# Patient Record
Sex: Female | Born: 1995 | Race: Black or African American | Hispanic: No | Marital: Single
Health system: Southern US, Community
[De-identification: ages and names within clinical notes are randomized; demographics above are authoritative.]

## PROBLEM LIST (undated history)

## (undated) DIAGNOSIS — J849 Interstitial pulmonary disease, unspecified: Secondary | ICD-10-CM

## (undated) DIAGNOSIS — I272 Pulmonary hypertension, unspecified: Secondary | ICD-10-CM

## (undated) DIAGNOSIS — I509 Heart failure, unspecified: Secondary | ICD-10-CM

## (undated) DIAGNOSIS — I2699 Other pulmonary embolism without acute cor pulmonale: Secondary | ICD-10-CM

## (undated) DIAGNOSIS — I10 Essential (primary) hypertension: Secondary | ICD-10-CM

## (undated) DIAGNOSIS — Z6841 Body Mass Index (BMI) 40.0 and over, adult: Secondary | ICD-10-CM

## (undated) DIAGNOSIS — O1490 Unspecified pre-eclampsia, unspecified trimester: Secondary | ICD-10-CM

## (undated) HISTORY — PX: ECMO CANNULATION: CATH118321

## (undated) HISTORY — PX: WISDOM TOOTH EXTRACTION: SHX21

## (undated) HISTORY — DX: Heart failure, unspecified: I50.9

## (undated) HISTORY — DX: Interstitial pulmonary disease, unspecified: J84.9

## (undated) HISTORY — PX: TONSILLECTOMY: SUR1361

## (undated) HISTORY — DX: Other pulmonary embolism without acute cor pulmonale: I26.99

## (undated) HISTORY — DX: Pulmonary hypertension, unspecified: I27.20

---
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
## 2016-04-24 ENCOUNTER — Emergency Department
Admission: EM | Admit: 2016-04-24 | Discharge: 2016-04-24 | Disposition: A | Discharge status: Home / Self Care | Payer: Medicaid Other | Attending: Emergency Medicine | Admitting: Emergency Medicine

## 2016-04-24 ENCOUNTER — Emergency Department: Payer: Medicaid Other

## 2016-04-24 DIAGNOSIS — J189 Pneumonia, unspecified organism: Secondary | ICD-10-CM | POA: Insufficient documentation

## 2016-04-24 DIAGNOSIS — H9201 Otalgia, right ear: Secondary | ICD-10-CM | POA: Insufficient documentation

## 2016-04-24 DIAGNOSIS — Z79899 Other long term (current) drug therapy: Secondary | ICD-10-CM | POA: Diagnosis not present

## 2016-04-24 DIAGNOSIS — I10 Essential (primary) hypertension: Secondary | ICD-10-CM | POA: Insufficient documentation

## 2016-04-24 DIAGNOSIS — R05 Cough: Secondary | ICD-10-CM | POA: Diagnosis present

## 2016-04-24 LAB — POCT PREGNANCY, URINE: PREG TEST UR: NEGATIVE

## 2016-04-24 LAB — CBC WITH DIFFERENTIAL/PLATELET
BASOS ABS: 0.1 10*3/uL (ref 0–0.1)
BASOS PCT: 1 %
EOS ABS: 0.1 10*3/uL (ref 0–0.7)
EOS PCT: 1 %
HCT: 36 % (ref 35.0–47.0)
Hemoglobin: 12.2 g/dL (ref 12.0–16.0)
LYMPHS PCT: 26 %
Lymphs Abs: 2.4 10*3/uL (ref 1.0–3.6)
MCH: 25.6 pg — ABNORMAL LOW (ref 26.0–34.0)
MCHC: 34 g/dL (ref 32.0–36.0)
MCV: 75.5 fL — AB (ref 80.0–100.0)
MONO ABS: 1 10*3/uL — AB (ref 0.2–0.9)
Monocytes Relative: 11 %
Neutro Abs: 5.8 10*3/uL (ref 1.4–6.5)
Neutrophils Relative %: 61 %
PLATELETS: 403 10*3/uL (ref 150–440)
RBC: 4.77 MIL/uL (ref 3.80–5.20)
RDW: 16 % — AB (ref 11.5–14.5)
WBC: 9.5 10*3/uL (ref 3.6–11.0)

## 2016-04-24 LAB — BASIC METABOLIC PANEL
Anion gap: 7 (ref 5–15)
BUN: 5 mg/dL — AB (ref 6–20)
CALCIUM: 8.9 mg/dL (ref 8.9–10.3)
CO2: 28 mmol/L (ref 22–32)
CREATININE: 0.8 mg/dL (ref 0.44–1.00)
Chloride: 101 mmol/L (ref 101–111)
GFR calc Af Amer: >60 mL/min (ref >60)
GLUCOSE: 87 mg/dL (ref 65–99)
Potassium: 3.7 mmol/L (ref 3.5–5.1)
SODIUM: 136 mmol/L (ref 135–145)

## 2016-04-24 MED ORDER — CEFTRIAXONE SODIUM-DEXTROSE 1-3.74 GM-% IV SOLR
1.0000 g | Freq: Once | INTRAVENOUS | Status: AC
  Administered 2016-04-24: 1 g via INTRAVENOUS
  Filled 2016-04-24: qty 50

## 2016-04-24 MED ORDER — AZITHROMYCIN 250 MG PO TABS: ORAL_TABLET | ORAL | 0 refills | Status: DC

## 2016-04-24 MED ORDER — CEFTRIAXONE SODIUM 1 G IJ SOLR
INTRAMUSCULAR | Status: AC
  Filled 2016-04-24: qty 10

## 2016-04-24 MED ORDER — BENZONATATE 100 MG PO CAPS: 100.0000 mg | ORAL_CAPSULE | Freq: Three times a day (TID) | ORAL | 0 refills | Status: DC | PRN

## 2016-04-24 MED ORDER — ALBUTEROL SULFATE HFA 108 (90 BASE) MCG/ACT IN AERS: 2.0000 | INHALATION_SPRAY | Freq: Four times a day (QID) | RESPIRATORY_TRACT | 0 refills | Status: DC | PRN

## 2016-04-24 MED ORDER — SODIUM CHLORIDE 0.9 % IV BOLUS (SEPSIS)
500.0000 mL | Freq: Once | INTRAVENOUS | Status: AC
  Administered 2016-04-24: 500 mL via INTRAVENOUS

## 2016-04-24 MED ORDER — DEXTROSE 5 % IV SOLN: 1.0000 g | Freq: Once | INTRAVENOUS | Status: DC

## 2016-04-24 NOTE — Discharge Instructions (Signed)
Take the prescription meds as directed. Continue to dose previously prescribed meds as directed. Follow-up with your provider as needed. Follow-up with [facility] or return to the ED as needed.

## 2016-04-24 NOTE — ED Triage Notes (Signed)
Pt c/o cough with sinus and chest congestion for the past 2 weeks, was [facility] and rx cough syrup with codeine that has not helped. Pt also c/o right ear pain.

## 2016-04-24 NOTE — ED Provider Notes (Signed)
[facility] Emergency Department Provider Note ____________________________________________  Time seen: 1604  I have reviewed the triage vital signs and the nursing notes.  HISTORY  Chief Complaint  URI  HPI Yesenia Carrillo is a 20 y.o. female presents to the ED for evaluation of 2 week complaint of continued cough, congestion, and sinus pressure. She is been seen by her primary care provider, [facility] on Friday. She was prescribed a cough syrup with codeine but denies any significant benefit. She also is notes onset of right ear pain the interim. She is also reporting intermittent fevers.  Past Medical History:  Diagnosis Date  . Hypertension    There are no active problems to display for this patient.  Past Surgical History:  Procedure Laterality Date  . TONSILLECTOMY      Prior to Admission medications   Medication Sig Start Date End Date Taking? Authorizing Provider  albuterol (PROVENTIL HFA;VENTOLIN HFA) 108 (90 Base) MCG/ACT inhaler Inhale 2 puffs into the lungs every 6 (six) hours as needed for wheezing or shortness of breath. 04/24/16   Ermina Oberman V Bacon Circe Chilton, PA-C  azithromycin (ZITHROMAX Z-PAK) 250 MG tablet Take 2 tablets (500 mg) on  Day 1,  followed by 1 tablet (250 mg) once daily on Days 2 through 5. 04/24/16   Zakk Borgen V Bacon Monaca Wadas, PA-C  benzonatate (TESSALON PERLES) 100 MG capsule Take 1 capsule (100 mg total) by mouth 3 (three) times daily as needed for cough (Take 1-2 per dose). 04/24/16   Ebert Forrester V Bacon Greer Wainright, PA-C  ciprofloxacin (CIPRO) 500 MG tablet Take 1 tablet (500 mg total) by mouth 2 (two) times daily. Patient not taking: Reported on 09/25/2015 09/24/15   Lavonia Drafts, MD  hydrochlorothiazide (HYDRODIURIL) 25 MG tablet Take 1 tablet (25 mg total) by mouth daily. Patient not taking: Reported on 09/25/2015 09/24/15   Lavonia Drafts, MD   Allergies Ampicillin  No family history on file.  Social History Social History   Substance Use Topics  . Smoking status: Never Smoker  . Smokeless tobacco: Never Used  . Alcohol use No   Review of Systems  Constitutional: Negative for fever. Eyes: Negative for visual changes. ENT: Negative for sore throat. Right ear pain.  Cardiovascular: Negative for chest pain. Chest congestion and cough Respiratory: Negative for shortness of breath. Gastrointestinal: Negative for abdominal pain, vomiting and diarrhea. Musculoskeletal: Negative for back pain. Neurological: Negative for headaches, focal weakness or numbness. ____________________________________________  PHYSICAL EXAM:  VITAL SIGNS: ED Triage Vitals  Enc Vitals Group     BP 04/24/16 1552 (!) 148/100     Pulse Rate 04/24/16 1549 (!) 124     Resp 04/24/16 1549 17     Temp 04/24/16 1549 99.2 F (37.3 C)     Temp Source 04/24/16 1549 Oral     SpO2 04/24/16 1549 97 %     Weight 04/24/16 1550 240 lb (108.9 kg)     Height 04/24/16 1550 5\' 4"  (1.626 m)     Head Circumference --      Peak Flow --      Pain Score 04/24/16 1550 8     Pain Loc --      Pain Edu? --      Excl. in [place]? --    Constitutional: Alert and oriented. Well appearing and in no distress. Head: Normocephalic and atraumatic. Eyes: Conjunctivae are normal. PERRL. Normal extraocular movements Ears: Canals clear. TMs intact bilaterally. Right TM with erythema, bulging, and purulent effusion noted.  Nose: No rhinorrhea/epistaxis. Enlarged mucous membranes.  Mouth/Throat: Mucous membranes are moist. Neck: Supple. No thyromegaly. Hematological/Lymphatic/Immunological: No cervical lymphadenopathy. Cardiovascular: Normal rate, regular rhythm. Normal distal pulses. Respiratory: Normal respiratory effort. No wheezes/rales/rhonchi. Musculoskeletal: Nontender with normal range of motion in all extremities.  ____________________________________________   LABS (pertinent positives/negatives)  Labs Reviewed  BASIC METABOLIC PANEL - Abnormal; Notable  for the following:       Result Value   BUN 5 (*)    All other components within normal limits  CBC WITH DIFFERENTIAL/PLATELET - Abnormal; Notable for the following:    MCV 75.5 (*)    MCH 25.6 (*)    RDW 16.0 (*)    Monocytes Absolute 1.0 (*)    All other components within normal limits  POC URINE PREG, ED  POCT PREGNANCY, URINE  ____________________________________________   RADIOLOGY  CXR IMPRESSION: Mild right perihilar interstitial prominence suggesting mild Pneumonitis.  I, Jazmaine Fuelling, Dannielle Karvonen, personally viewed and evaluated these images (plain radiographs) as part of my medical decision making, as well as reviewing the written report by the radiologist. ____________________________________________  PROCEDURES  Rocephin 1 g IV NS 500 ml IV ____________________________________________  INITIAL IMPRESSION / ASSESSMENT AND PLAN / ED COURSE  Patient with clinical confirmation of right hilar pneumonitis. She is discharged with prescription for azithromycin, Tessalon Perles, and albuterol. She will continue to dose any previously prescribed cough medicine. She'll follow-up with her primary care provider for ongoing symptom management, or return to the ED for worsening symptoms.  Clinical Course    ____________________________________________  FINAL CLINICAL IMPRESSION(S) / ED DIAGNOSES  Final diagnoses:  Pneumonitis      Melvenia Needles, PA-C 04/25/16 0018    Carrie Mew, MD 05/02/16 [id number]

## 2016-04-24 NOTE — ED Notes (Signed)
Discharge instructions reviewed with patient. Patient verbalized understanding. Patient ambulated to lobby without difficulty.   

## 2016-12-08 ENCOUNTER — Emergency Department
Admission: EM | Admit: 2016-12-08 | Discharge: 2016-12-08 | Disposition: A | Discharge status: Home / Self Care | Payer: Medicaid Other | Attending: Emergency Medicine | Admitting: Emergency Medicine

## 2016-12-08 ENCOUNTER — Encounter: Payer: Self-pay | Admitting: Medical Oncology

## 2016-12-08 DIAGNOSIS — I10 Essential (primary) hypertension: Secondary | ICD-10-CM | POA: Insufficient documentation

## 2016-12-08 DIAGNOSIS — J00 Acute nasopharyngitis [common cold]: Secondary | ICD-10-CM | POA: Diagnosis not present

## 2016-12-08 DIAGNOSIS — J029 Acute pharyngitis, unspecified: Secondary | ICD-10-CM | POA: Diagnosis present

## 2016-12-08 LAB — POCT RAPID STREP A: STREPTOCOCCUS, GROUP A SCREEN (DIRECT): NEGATIVE

## 2016-12-08 MED ORDER — AZITHROMYCIN 250 MG PO TABS: ORAL_TABLET | ORAL | 0 refills | Status: AC

## 2016-12-08 NOTE — ED Provider Notes (Signed)
[facility] Emergency Department Provider Note   ____________________________________________   I have reviewed the triage vital signs and the nursing notes.   HISTORY  Chief Complaint Sore Throat and Chills    HPI Yesenia Carrillo is a 21 y.o. female presents to the emergency department with sore throat, mild congestion and pain with swallowing for 1 day. Patient has been afebrile through current symptoms. Patient is unsure of any sick contacts. Patient denies any history of asthma however notes slight shortness of breath with current sore throat symptoms. Patient denies fever, chills, headache, vision changes, chest pain, chest tightness, abdominal pain, nausea and vomiting.  Past Medical History:  Diagnosis Date  . Hypertension     There are no active problems to display for this patient.   Past Surgical History:  Procedure Laterality Date  . TONSILLECTOMY      Prior to Admission medications   Medication Sig Start Date End Date Taking? Authorizing Provider  albuterol (PROVENTIL HFA;VENTOLIN HFA) 108 (90 Base) MCG/ACT inhaler Inhale 2 puffs into the lungs every 6 (six) hours as needed for wheezing or shortness of breath. 04/24/16   Menshew, Dannielle Karvonen, PA-C  azithromycin (ZITHROMAX Z-PAK) 250 MG tablet Take 2 tablets (500 mg) on  Day 1,  followed by 1 tablet (250 mg) once daily on Days 2 through 5. 12/08/16 12/13/16  Kyaira Trantham M, PA-C  benzonatate (TESSALON PERLES) 100 MG capsule Take 1 capsule (100 mg total) by mouth 3 (three) times daily as needed for cough (Take 1-2 per dose). 04/24/16   Menshew, Dannielle Karvonen, PA-C  ciprofloxacin (CIPRO) 500 MG tablet Take 1 tablet (500 mg total) by mouth 2 (two) times daily. Patient not taking: Reported on 09/25/2015 09/24/15   Lavonia Drafts, MD  hydrochlorothiazide (HYDRODIURIL) 25 MG tablet Take 1 tablet (25 mg total) by mouth daily. Patient not taking: Reported on 09/25/2015 09/24/15   Lavonia Drafts, MD     Allergies Ampicillin  No family history on file.  Social History Social History  Substance Use Topics  . Smoking status: Never Smoker  . Smokeless tobacco: Never Used  . Alcohol use No    Review of Systems Constitutional: Negative for fever/chills Eyes: No visual changes. ENT:  Positive for sore throat and for difficulty swallowing Cardiovascular: Denies chest pain. Respiratory: Denies cough. Positive for shortness of breath. Gastrointestinal: No abdominal pain.  No nausea, vomiting, diarrhea. Musculoskeletal: Negative for back pain. Negative for generalized body aches. Skin: Negative for rash. Neurological: Negative for headaches. ____________________________________________   PHYSICAL EXAM:  VITAL SIGNS: Patient Vitals for the past 24 hrs:  BP Temp Temp src Pulse Resp SpO2 Weight  12/08/16 1915 (!) 141/98 - - (!) 105 18 99 % -  12/08/16 1745 (!) 153/101 98.5 F (36.9 C) Oral (!) 104 18 98 % -  12/08/16 1743 - - - - - - 108.9 kg (240 lb)    Constitutional: Alert and oriented. Well appearing and in no acute distress.  Head: Normocephalic and atraumatic. Eyes: Conjunctivae are normal. PERRL. Ears: Canals clear. TMs intact bilaterally. Nose: Mild congestion, no rhinorrhea.  Mouth/Throat: Mucous membranes are moist. Oropharynx erythematous without exudate. Tonsils symmetrical bilaterally with uvula midline.  Neck: Supple.  Hematological/Lymphatic/Immunological: No cervical lymphadenopathy. Cardiovascular: Normal rate, regular rhythm. Normal distal pulses. Respiratory: Normal respiratory effort. No wheezes/rales/rhonchi. Lungs CTAB  Musculoskeletal: Nontender with normal range of motion in all extremities. Neurologic: Normal speech and language.  Skin:  Skin is warm, dry and intact. No  rash noted. Psychiatric: Mood and affect are normal.  ____________________________________________   LABS (all labs ordered are listed, but only abnormal results are  displayed)  Labs Reviewed  POCT RAPID STREP A   ____________________________________________  EKG none ____________________________________________  RADIOLOGY none ____________________________________________   PROCEDURES  Procedure(s) performed: no    Critical Care performed: no ____________________________________________   INITIAL IMPRESSION / ASSESSMENT AND PLAN / ED COURSE  Pertinent labs & imaging results that were available during my care of the patient were reviewed by me and considered in my medical decision making (see chart for details).  Patient presents to the emergency department sore throat, mild congestion and afebrile.  History, physical exam and negative POC Strep are reassuring symptoms are consistent with nasopharyngitis. Patient will be prescribed azithromycin and advised to take over the counter cough and cold medication for symptoms relief. Patient will also be encourage to rest, and rehydrate as a part of supportive care. Physical exam is reassuring at this time. Patient informed of clinical course, understand medical decision-making process, and agree with plan.  Patient was advised to follow up with PCP as needed and was also advised to return to the emergency department for symptoms that change or worsen.      ____________________________________________   FINAL CLINICAL IMPRESSION(S) / ED DIAGNOSES  Final diagnoses:  Acute nasopharyngitis  Sore throat       NEW MEDICATIONS STARTED DURING THIS VISIT:  New Prescriptions   AZITHROMYCIN (ZITHROMAX Z-PAK) 250 MG TABLET    Take 2 tablets (500 mg) on  Day 1,  followed by 1 tablet (250 mg) once daily on Days 2 through 5.     Note:  This document was prepared using Dragon voice recognition software and may include unintentional dictation errors.    Sherian Valenza, Laroy Apple, PA-C 12/08/16 1937    Nance Pear, MD 12/08/16 2046

## 2016-12-08 NOTE — ED Triage Notes (Signed)
Pt reports sore throat, body aches and chills that began yesterday.

## 2017-02-08 ENCOUNTER — Encounter: Payer: Self-pay | Admitting: Emergency Medicine

## 2017-02-08 ENCOUNTER — Emergency Department
Admission: EM | Admit: 2017-02-08 | Discharge: 2017-02-08 | Disposition: A | Discharge status: Home / Self Care | Payer: Medicaid Other | Attending: Emergency Medicine | Admitting: Emergency Medicine

## 2017-02-08 DIAGNOSIS — Z202 Contact with and (suspected) exposure to infections with a predominantly sexual mode of transmission: Secondary | ICD-10-CM | POA: Diagnosis not present

## 2017-02-08 DIAGNOSIS — I10 Essential (primary) hypertension: Secondary | ICD-10-CM | POA: Insufficient documentation

## 2017-02-08 LAB — URINALYSIS, COMPLETE (UACMP) WITH MICROSCOPIC
BACTERIA UA: NONE SEEN
Bilirubin Urine: NEGATIVE
GLUCOSE, UA: NEGATIVE mg/dL
KETONES UR: NEGATIVE mg/dL
LEUKOCYTES UA: NEGATIVE
Nitrite: NEGATIVE
PROTEIN: 100 mg/dL — AB
Specific Gravity, Urine: 1.015 (ref 1.005–1.030)
pH: 5 (ref 5.0–8.0)

## 2017-02-08 LAB — CHLAMYDIA/NGC RT PCR (ARMC ONLY)
CHLAMYDIA TR: NOT DETECTED
N gonorrhoeae: NOT DETECTED

## 2017-02-08 LAB — WET PREP, GENITAL
CLUE CELLS WET PREP: NONE SEEN
Sperm: NONE SEEN
Trich, Wet Prep: NONE SEEN
YEAST WET PREP: NONE SEEN

## 2017-02-08 LAB — POCT PREGNANCY, URINE: Preg Test, Ur: NEGATIVE

## 2017-02-08 NOTE — ED Notes (Signed)
Pt states she is here for STD test. Denies vaginal drainage, denies pain or itching. Last sexual intercourse 8/3, unprotected. Denies pain anywhere at this time. Alert, oriented, ambulatory.

## 2017-02-08 NOTE — ED Provider Notes (Signed)
[facility] Emergency Department Provider Note ____________________________________________  Time seen: 1737  I have reviewed the triage vital signs and the nursing notes.  HISTORY  Chief Complaint  Exposure to STD  HPI Yesenia Carrillo is a 21 y.o. female As this is the ED for evaluation of STD exposure. She describes getting a call from a former boyfriend who she last had intermitntact with about a month ago. She reports that he told her he was tested and was positive, but did not elaborate on what type of infection. She denies any vaginal discharge, irritation, or abnormal bleeding. She is also without any pelvic pain, abdominal pain, fevers, chills, sweats, or nausea.   Past Medical History:  Diagnosis Date  . Hypertension     There are no active problems to display for this patient.   Past Surgical History:  Procedure Laterality Date  . TONSILLECTOMY    . WISDOM TOOTH EXTRACTION      Prior to Admission medications   Medication Sig Start Date End Date Taking? Authorizing Provider  albuterol (PROVENTIL HFA;VENTOLIN HFA) 108 (90 Base) MCG/ACT inhaler Inhale 2 puffs into the lungs every 6 (six) hours as needed for wheezing or shortness of breath. 04/24/16   Crystalmarie Yasin, Dannielle Karvonen, PA-C  benzonatate (TESSALON PERLES) 100 MG capsule Take 1 capsule (100 mg total) by mouth 3 (three) times daily as needed for cough (Take 1-2 per dose). 04/24/16   Leno Mathes, Dannielle Karvonen, PA-C  ciprofloxacin (CIPRO) 500 MG tablet Take 1 tablet (500 mg total) by mouth 2 (two) times daily. Patient not taking: Reported on 09/25/2015 09/24/15   Lavonia Drafts, MD  hydrochlorothiazide (HYDRODIURIL) 25 MG tablet Take 1 tablet (25 mg total) by mouth daily. Patient not taking: Reported on 09/25/2015 09/24/15   Lavonia Drafts, MD    Allergies Ampicillin  No family history on file.  Social History Social History  Substance Use Topics  . Smoking status: Never Smoker  . Smokeless  tobacco: Never Used  . Alcohol use No    Review of Systems  Constitutional: Negative for fever. Cardiovascular: Negative for chest pain. Respiratory: Negative for shortness of breath. Gastrointestinal: Negative for abdominal pain, vomiting and diarrhea. Genitourinary: Negative for dysuria. Skin: Negative for rash. ____________________________________________  PHYSICAL EXAM:  VITAL SIGNS: ED Triage Vitals  Enc Vitals Group     BP 02/08/17 1536 (!) 168/102     Pulse Rate 02/08/17 1536 99     Resp 02/08/17 1536 18     Temp 02/08/17 1536 98.4 F (36.9 C)     Temp Source 02/08/17 1536 Oral     SpO2 02/08/17 1536 100 %     Weight 02/08/17 1536 250 lb (113.4 kg)     Height 02/08/17 1536 5' (1.524 m)     Head Circumference --      Peak Flow --      Pain Score 02/08/17 1922 0     Pain Loc --      Pain Edu? --      Excl. in [place]? --     Constitutional: Alert and oriented. Well appearing and in no distress. Head: Normocephalic and atraumatic. Cardiovascular: Normal rate, regular rhythm. Respiratory: Normal respiratory effort.  GU: normal external genitalia. Patient is on her menses, and dark blood is noted to be in the vaginal canal. Cervix is normal and there is no obvious vaginal discharge. No CMT or adnexal masse appreciated. Skin:  Skin is warm, dry and intact. No rash noted. ____________________________________________  LABS (pertinent positives/negatives)  Labs Reviewed  WET PREP, GENITAL - Abnormal; Notable for the following:       Result Value   WBC, Wet Prep HPF POC RARE (*)    All other components within normal limits  URINALYSIS, COMPLETE (UACMP) WITH MICROSCOPIC - Abnormal; Notable for the following:    Color, Urine YELLOW (*)    APPearance HAZY (*)    Hgb urine dipstick LARGE (*)    Protein, ur 100 (*)    Squamous Epithelial / LPF 6-30 (*)    All other components within normal limits  CHLAMYDIA/NGC RT PCR ([facility] ONLY)  POCT PREGNANCY, URINE   ___________________________________________  INITIAL IMPRESSION / ASSESSMENT AND PLAN / ED COURSE  Patient notified at the time of discharge of her negative wet prep results. She is advised that her exam is overall benign and showed no signs of any acute infection. She was advised to call back in 2 hours for the results of her GC culture.  ----------------------------------------- 9:23 PM on 02/08/2017 ----------------------------------------- Verified patient identity over the phone, and gave results of her negative GC culture. Patient thanked me for my time and consideration. ____________________________________________  FINAL CLINICAL IMPRESSION(S) / ED DIAGNOSES  Final diagnoses:  Possible exposure to STD      Saryiah Bencosme, Dannielle Karvonen, PA-C 02/08/17 2124    Eula Listen, MD 02/08/17 2348

## 2017-02-08 NOTE — ED Triage Notes (Signed)
Patient presents to ED via POV from home. Patient states, "someone I used to mess with told me I needed to come here to get checked for STDs". Patient denies any vaginal discharge, odor or itching.

## 2017-02-08 NOTE — Discharge Instructions (Signed)
Your quick test was negative. Your 2-hour GC test is still pending. You may call back in 2 hours for the results. Follow-up with your provider for further testing. Practice safe sex, ALWAYS! These "Boys" ain't loyal!!!

## 2017-02-08 NOTE — ED Notes (Signed)
[phone] cell phone of pt to call with results

## 2017-09-18 ENCOUNTER — Emergency Department
Admission: EM | Admit: 2017-09-18 | Discharge: 2017-09-18 | Disposition: A | Discharge status: Home / Self Care | Payer: Medicaid Other | Attending: Student in an Organized Health Care Education/Training Program | Admitting: Student in an Organized Health Care Education/Training Program

## 2017-09-18 ENCOUNTER — Other Ambulatory Visit: Payer: Self-pay

## 2017-09-18 ENCOUNTER — Encounter: Payer: Self-pay | Admitting: Emergency Medicine

## 2017-09-18 ENCOUNTER — Emergency Department: Payer: Medicaid Other

## 2017-09-18 DIAGNOSIS — I10 Essential (primary) hypertension: Secondary | ICD-10-CM | POA: Insufficient documentation

## 2017-09-18 DIAGNOSIS — J181 Lobar pneumonia, unspecified organism: Secondary | ICD-10-CM

## 2017-09-18 DIAGNOSIS — J189 Pneumonia, unspecified organism: Secondary | ICD-10-CM | POA: Diagnosis not present

## 2017-09-18 DIAGNOSIS — R05 Cough: Secondary | ICD-10-CM | POA: Diagnosis present

## 2017-09-18 MED ORDER — IPRATROPIUM-ALBUTEROL 0.5-2.5 (3) MG/3ML IN SOLN
3.0000 mL | Freq: Once | RESPIRATORY_TRACT | Status: AC
  Administered 2017-09-18: 3 mL via RESPIRATORY_TRACT
  Filled 2017-09-18: qty 3

## 2017-09-18 MED ORDER — METHYLPREDNISOLONE 4 MG PO TBPK: ORAL_TABLET | ORAL | 0 refills | Status: DC

## 2017-09-18 MED ORDER — BENZONATATE 200 MG PO CAPS: 200.0000 mg | ORAL_CAPSULE | Freq: Three times a day (TID) | ORAL | 0 refills | Status: DC | PRN

## 2017-09-18 MED ORDER — AZITHROMYCIN 500 MG PO TABS
500.0000 mg | ORAL_TABLET | Freq: Once | ORAL | Status: AC
  Administered 2017-09-18: 500 mg via ORAL
  Filled 2017-09-18: qty 1

## 2017-09-18 MED ORDER — AZITHROMYCIN 250 MG PO TABS: ORAL_TABLET | ORAL | 0 refills | Status: DC

## 2017-09-18 NOTE — Discharge Instructions (Addendum)
Follow-up with your regular doctor if not better in 3 days.  Return emergency department if worsening.  Take medication as prescribed.  He can also use over-the-counter cold medicine as needed

## 2017-09-18 NOTE — ED Provider Notes (Signed)
[facility] Emergency Department Provider Note  ____________________________________________   First MD Initiated Contact with Patient 09/18/17 1233     (approximate)  I have reviewed the triage vital signs and the nursing notes.   HISTORY  Chief Complaint Cough and Chest Pain    HPI Yesenia Carrillo is a 22 y.o. female presents emergency department complaining of cough and congestion.  She states she has had bronchitis for about a month.  [facility] is [facility].  At first they gave her an antibiotic steroids a cough syrup and inhaler.  Then they gave her more cough pills the next time.  She states she cannot get rid of the cough.  States that her chest will hurt in her ribs hurt when she coughs.  She denies any shortness of breath.  She is not heard any wheezing.  She does smoke occasionally.  She denies any vomiting or diarrhea.  She denies any fever or chills.  She is not taking any over-the-counter medicines for the cough at this time.  She denies taking any birth control  Past Medical History:  Diagnosis Date  . Hypertension     There are no active problems to display for this patient.   Past Surgical History:  Procedure Laterality Date  . TONSILLECTOMY    . WISDOM TOOTH EXTRACTION      Prior to Admission medications   Medication Sig Start Date End Date Taking? Authorizing Provider  albuterol (PROVENTIL HFA;VENTOLIN HFA) 108 (90 Base) MCG/ACT inhaler Inhale 2 puffs into the lungs every 6 (six) hours as needed for wheezing or shortness of breath. 04/24/16   Menshew, Dannielle Karvonen, PA-C  azithromycin (ZITHROMAX Z-PAK) 250 MG tablet 2 pills today then 1 pill a day for 4 days 09/18/17   Caryn Section Linden Dolin, PA-C  benzonatate (TESSALON) 200 MG capsule Take 1 capsule (200 mg total) by mouth 3 (three) times daily as needed for cough. 09/18/17   Jaydi Bray, Linden Dolin, PA-C  hydrochlorothiazide (HYDRODIURIL) 25 MG tablet Take 1 tablet (25 mg  total) by mouth daily. Patient not taking: Reported on 09/25/2015 09/24/15   Lavonia Drafts, MD  methylPREDNISolone (MEDROL DOSEPAK) 4 MG TBPK tablet Take 6 pills on day one then decrease by 1 pill each day 09/18/17   Versie Starks, PA-C    Allergies Penicillins and Ampicillin  No family history on file.  Social History Social History   Tobacco Use  . Smoking status: Never Smoker  . Smokeless tobacco: Never Used  Substance Use Topics  . Alcohol use: No  . Drug use: No    Review of Systems  Constitutional: Unsure fever/chills, did not take her temperature Eyes: No visual changes. ENT: No sore throat.  Positive runny nose and congestion Respiratory: Positive cough Genitourinary: Negative for dysuria. Musculoskeletal: Negative for back pain. Skin: Negative for rash.    ____________________________________________   PHYSICAL EXAM:  VITAL SIGNS: ED Triage Vitals  Enc Vitals Group     BP 09/18/17 1149 (!) 141/97     Pulse Rate 09/18/17 1146 (!) 104     Resp 09/18/17 1146 18     Temp 09/18/17 1146 97.8 F (36.6 C)     Temp Source 09/18/17 1146 Oral     SpO2 09/18/17 1146 97 %     Weight 09/18/17 1145 250 lb (113.4 kg)     Height 09/18/17 1145 5\' 2"  (1.575 m)     Head Circumference --  Peak Flow --      Pain Score 09/18/17 1149 8     Pain Loc --      Pain Edu? --      Excl. in [place]? --     Constitutional: Alert and oriented. Well appearing and in no acute distress. Eyes: Conjunctivae are normal.  Head: Atraumatic. Nose: No congestion/rhinnorhea. Mouth/Throat: Mucous membranes are moist.  Throat appears normal Cardiovascular:  tachycardic, regular rhythm.  Heart sounds are normal Respiratory: Normal respiratory effort.  No retractions, lungs are clear to auscultation, cough is dry GU: deferred Musculoskeletal: FROM all extremities, warm and well perfused Neurologic:  Normal speech and language.  Skin:  Skin is warm, dry and intact. No rash  noted. Psychiatric: Mood and affect are normal. Speech and behavior are normal.  ____________________________________________   LABS (all labs ordered are listed, but only abnormal results are displayed)  Labs Reviewed - No data to display ____________________________________________   ____________________________________________  RADIOLOGY  Chest x-ray is small infiltrate left lower lobe  ____________________________________________   PROCEDURES  Procedure(s) performed: DuoNeb, Zithromax 500 mg p.o.  Procedures    ____________________________________________   INITIAL IMPRESSION / ASSESSMENT AND PLAN / ED COURSE  Pertinent labs & imaging results that were available during my care of the patient were reviewed by me and considered in my medical decision making (see chart for details).  Patient is 22 year old female complaining of cough for over 1 month.  [facility] and given antibiotics, steroids, cough medications and inhaler.  She was seen there twice.  She states she is not getting any better.  On physical exam patient appears very well.  She does have a dry cough.  Her lungs are clear to auscultation and her heart sounds are normal.  She is a little tachycardic.     Chest x-ray shows a left lower lobe infiltrate.  X-ray results were discussed with the patient.  She was given Zithromax 500 mg p.o. and a DuoNeb while in the ED.  She is given a prescription for Z-Pak and Tessalon Perles, Medrol Dosepak for inflammation.  She is to take this medication.  She is return to emergency department if worsening.  She states she understands and will comply with our instructions.  She was discharged in stable condition  As part of my medical decision making, I reviewed the following data within the [place] notes reviewed and incorporated, EKG interpreted tachycardia, EKG read by heart station Dr., Old chart reviewed, Radiograph  reviewed, chest x-ray is positive for left lower lobe infiltrate, Notes from prior ED visits and [place] Controlled Substance Database  ____________________________________________   FINAL CLINICAL IMPRESSION(S) / ED DIAGNOSES  Final diagnoses:  Community acquired pneumonia of right lower lobe of lung ([place])      NEW MEDICATIONS STARTED DURING THIS VISIT:  Discharge Medication List as of 09/18/2017  2:13 PM    START taking these medications   Details  azithromycin (ZITHROMAX Z-PAK) 250 MG tablet 2 pills today then 1 pill a day for 4 days, Print    methylPREDNISolone (MEDROL DOSEPAK) 4 MG TBPK tablet Take 6 pills on day one then decrease by 1 pill each day, Print         Note:  This document was prepared using Dragon voice recognition software and may include unintentional dictation errors.    Versie Starks, PA-C 09/18/17 1553    Merlyn Lot, MD 09/18/17 [phone]

## 2017-09-18 NOTE — ED Notes (Signed)
See triage note  States she developed some subjective fever and cough about 1 week ago  Now having some discomfort in chest with cough and inspiration

## 2017-09-18 NOTE — ED Triage Notes (Signed)
Coughing for a month now, has been seen a few times in urgent care, states pressure in her chest occssionally and pain in her ribs when she coughs.

## 2017-09-25 ENCOUNTER — Encounter: Payer: Self-pay | Admitting: Emergency Medicine

## 2017-09-25 ENCOUNTER — Emergency Department
Admission: EM | Admit: 2017-09-25 | Discharge: 2017-09-25 | Disposition: A | Discharge status: Home / Self Care | Payer: Medicaid Other | Attending: Emergency Medicine | Admitting: Emergency Medicine

## 2017-09-25 ENCOUNTER — Other Ambulatory Visit: Payer: Self-pay

## 2017-09-25 ENCOUNTER — Emergency Department: Payer: Medicaid Other

## 2017-09-25 DIAGNOSIS — Y998 Other external cause status: Secondary | ICD-10-CM | POA: Insufficient documentation

## 2017-09-25 DIAGNOSIS — I10 Essential (primary) hypertension: Secondary | ICD-10-CM | POA: Diagnosis not present

## 2017-09-25 DIAGNOSIS — Y92410 Unspecified street and highway as the place of occurrence of the external cause: Secondary | ICD-10-CM | POA: Diagnosis not present

## 2017-09-25 DIAGNOSIS — M542 Cervicalgia: Secondary | ICD-10-CM | POA: Insufficient documentation

## 2017-09-25 DIAGNOSIS — Y9389 Activity, other specified: Secondary | ICD-10-CM | POA: Insufficient documentation

## 2017-09-25 MED ORDER — MELOXICAM 15 MG PO TABS: 15.0000 mg | ORAL_TABLET | Freq: Every day | ORAL | 1 refills | Status: AC

## 2017-09-25 MED ORDER — KETOROLAC TROMETHAMINE 30 MG/ML IJ SOLN
30.0000 mg | Freq: Once | INTRAMUSCULAR | Status: AC
  Administered 2017-09-25: 30 mg via INTRAMUSCULAR
  Filled 2017-09-25: qty 1

## 2017-09-25 MED ORDER — CYCLOBENZAPRINE HCL 10 MG PO TABS: 10.0000 mg | ORAL_TABLET | Freq: Three times a day (TID) | ORAL | 0 refills | Status: AC | PRN

## 2017-09-25 NOTE — ED Notes (Signed)
Pt ambulatory to friends room without difficulty. VSS. NAD. Discharge instruction, RX and follow up discussed. All questions addressed.

## 2017-09-25 NOTE — ED Notes (Signed)
Patient transported to CT 
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
## 2018-03-12 ENCOUNTER — Emergency Department
Admission: EM | Admit: 2018-03-12 | Discharge: 2018-03-12 | Disposition: A | Discharge status: Home / Self Care | Payer: Self-pay | Attending: Emergency Medicine | Admitting: Emergency Medicine

## 2018-03-12 ENCOUNTER — Other Ambulatory Visit: Payer: Self-pay

## 2018-03-12 ENCOUNTER — Emergency Department: Payer: Self-pay

## 2018-03-12 DIAGNOSIS — R109 Unspecified abdominal pain: Secondary | ICD-10-CM

## 2018-03-12 DIAGNOSIS — Z79899 Other long term (current) drug therapy: Secondary | ICD-10-CM | POA: Insufficient documentation

## 2018-03-12 DIAGNOSIS — N390 Urinary tract infection, site not specified: Secondary | ICD-10-CM | POA: Insufficient documentation

## 2018-03-12 DIAGNOSIS — I10 Essential (primary) hypertension: Secondary | ICD-10-CM | POA: Insufficient documentation

## 2018-03-12 DIAGNOSIS — F17290 Nicotine dependence, other tobacco product, uncomplicated: Secondary | ICD-10-CM | POA: Insufficient documentation

## 2018-03-12 LAB — CBC
HCT: 37 % (ref 36.0–46.0)
HEMOGLOBIN: 12.1 g/dL (ref 12.0–15.0)
MCH: 23.9 pg — ABNORMAL LOW (ref 26.0–34.0)
MCHC: 32.7 g/dL (ref 30.0–36.0)
MCV: 73.1 fL — AB (ref 80.0–100.0)
NRBC: 0 % (ref 0.0–0.2)
Platelets: 477 10*3/uL — ABNORMAL HIGH (ref 150–400)
RBC: 5.06 MIL/uL (ref 3.87–5.11)
RDW: 16.6 % — AB (ref 11.5–15.5)
WBC: 12 10*3/uL — AB (ref 4.0–10.5)

## 2018-03-12 LAB — URINALYSIS, COMPLETE (UACMP) WITH MICROSCOPIC
BILIRUBIN URINE: NEGATIVE
Glucose, UA: NEGATIVE mg/dL
HGB URINE DIPSTICK: NEGATIVE
KETONES UR: NEGATIVE mg/dL
NITRITE: NEGATIVE
Protein, ur: NEGATIVE mg/dL
SPECIFIC GRAVITY, URINE: 1.016 (ref 1.005–1.030)
pH: 5 (ref 5.0–8.0)

## 2018-03-12 LAB — POCT PREGNANCY, URINE: PREG TEST UR: NEGATIVE

## 2018-03-12 LAB — LIPASE, BLOOD: LIPASE: 20 U/L (ref 11–51)

## 2018-03-12 LAB — COMPREHENSIVE METABOLIC PANEL
ALT: 24 U/L (ref 0–44)
ANION GAP: 8 (ref 5–15)
AST: 37 U/L (ref 15–41)
Albumin: 4.5 g/dL (ref 3.5–5.0)
Alkaline Phosphatase: 54 U/L (ref 38–126)
BUN: 11 mg/dL (ref 6–20)
CHLORIDE: 105 mmol/L (ref 98–111)
CO2: 27 mmol/L (ref 22–32)
CREATININE: 0.71 mg/dL (ref 0.44–1.00)
Calcium: 9.5 mg/dL (ref 8.9–10.3)
Glucose, Bld: 89 mg/dL (ref 70–99)
POTASSIUM: 3.8 mmol/L (ref 3.5–5.1)
Sodium: 140 mmol/L (ref 135–145)
Total Bilirubin: 0.5 mg/dL (ref 0.3–1.2)
Total Protein: 8.4 g/dL — ABNORMAL HIGH (ref 6.5–8.1)

## 2018-03-12 MED ORDER — NITROFURANTOIN MONOHYD MACRO 100 MG PO CAPS: 100.0000 mg | ORAL_CAPSULE | Freq: Two times a day (BID) | ORAL | 0 refills | Status: DC

## 2018-03-12 MED ORDER — NITROFURANTOIN MONOHYD MACRO 100 MG PO CAPS: 100.0000 mg | ORAL_CAPSULE | Freq: Two times a day (BID) | ORAL | 0 refills | Status: AC

## 2018-03-12 MED ORDER — FAMOTIDINE 20 MG PO TABS: 20.0000 mg | ORAL_TABLET | Freq: Two times a day (BID) | ORAL | 0 refills | Status: DC

## 2018-03-12 NOTE — ED Triage Notes (Signed)
Pt to the er from [facility] for RUQ pain.Pt sent from [facility] due to possible gall bladder issues. No labs done there. Py in no acute distress.

## 2018-03-12 NOTE — Discharge Instructions (Signed)
At this time there is no evidence of gallbladder disease, or appendicitis, or any other intra-abdominal infection, or infection in your belly.  Would prefer not to stay for CT scan which not unreasonable but does mean that you must be vigilant about your health.  If you have increased pain, vomiting, fever, or other symptoms of concern return to the emergency room.  Follow closely with your doctor tomorrow the next day.

## 2018-03-12 NOTE — ED Notes (Signed)
Pt to the er for ruq pain. Mother ha s ahx of gallbladder disease. Pt sent over from [facility]. Pt in no distress.

## 2018-03-12 NOTE — ED Notes (Signed)
CT offered by Dr Burlene Arnt and refused by pt.

## 2018-03-12 NOTE — ED Provider Notes (Addendum)
[facility] Emergency Department Provider Note  ____________________________________________   I have reviewed the triage vital signs and the nursing notes. Where available I have reviewed prior notes and, if possible and indicated, outside hospital notes.    HISTORY  Chief Complaint Abdominal Pain    HPI Yesenia Carrillo is a 22 y.o. female  Who is healthy, states that she is having right upper quadrant abdominal pain since this morning.  No fever no chills no vomiting, no other associated symptoms.  No lower abdominal pain.  No dysuria, denies patency.  The cramping discomfort that comes and goes, she not currently not want pain medication for it.  No radiation no other complaints.  Nothing makes better nothing makes it worse no other alleviating or aggravating factors,     Past Medical History:  Diagnosis Date  . Hypertension     There are no active problems to display for this patient.   Past Surgical History:  Procedure Laterality Date  . TONSILLECTOMY    . WISDOM TOOTH EXTRACTION      Prior to Admission medications   Medication Sig Start Date End Date Taking? Authorizing Provider  albuterol (PROVENTIL HFA;VENTOLIN HFA) 108 (90 Base) MCG/ACT inhaler Inhale 2 puffs into the lungs every 6 (six) hours as needed for wheezing or shortness of breath. 04/24/16   Menshew, Dannielle Karvonen, PA-C  azithromycin (ZITHROMAX Z-PAK) 250 MG tablet 2 pills today then 1 pill a day for 4 days 09/18/17   Caryn Section Linden Dolin, PA-C  benzonatate (TESSALON) 200 MG capsule Take 1 capsule (200 mg total) by mouth 3 (three) times daily as needed for cough. 09/18/17   Fisher, Linden Dolin, PA-C  hydrochlorothiazide (HYDRODIURIL) 25 MG tablet Take 1 tablet (25 mg total) by mouth daily. Patient not taking: Reported on 09/25/2015 09/24/15   Lavonia Drafts, MD  methylPREDNISolone (MEDROL DOSEPAK) 4 MG TBPK tablet Take 6 pills on day one then decrease by 1 pill each day 09/18/17   Versie Starks,  PA-C    Allergies Penicillins and Ampicillin  No family history on file.  Social History Social History   Tobacco Use  . Smoking status: Current Every Day Smoker    Types: Cigars  . Smokeless tobacco: Never Used  Substance Use Topics  . Alcohol use: Yes    Comment: monthly  . Drug use: No    Review of Systems Constitutional: No fever/chills Eyes: No visual changes. ENT: No sore throat. No stiff neck no neck pain Cardiovascular: Denies chest pain. Respiratory: Denies shortness of breath. Gastrointestinal:   no vomiting.  No diarrhea.  No constipation. Genitourinary: Negative for dysuria. Musculoskeletal: Negative lower extremity swelling Skin: Negative for rash. Neurological: Negative for severe headaches, focal weakness or numbness.   ____________________________________________   PHYSICAL EXAM:  VITAL SIGNS: ED Triage Vitals  Enc Vitals Group     BP 03/12/18 1826 135/85     Pulse Rate 03/12/18 1826 93     Resp 03/12/18 1826 16     Temp 03/12/18 1826 98.5 F (36.9 C)     Temp Source 03/12/18 1826 Oral     SpO2 03/12/18 1826 98 %     Weight 03/12/18 1828 270 lb (122.5 kg)     Height 03/12/18 1828 5\' 1"  (1.549 m)     Head Circumference --      Peak Flow --      Pain Score 03/12/18 1827 8     Pain Loc --  Pain Edu? --      Excl. in [place]? --     Constitutional: Alert and oriented. Well appearing and in no acute distress. Eyes: Conjunctivae are normal Head: Atraumatic HEENT: No congestion/rhinnorhea. Mucous membranes are moist.  Oropharynx non-erythematous Neck:   Nontender with no meningismus, no masses, no stridor Cardiovascular: Normal rate, regular rhythm. Grossly normal heart sounds.  Good peripheral circulation. Respiratory: Normal respiratory effort.  No retractions. Lungs CTAB. Abdominal: Soft and focal epigastric right upper quadrant abdominal discomfort. No distention. No guarding no rebound morbid obesity noted Back:  There is no focal  tenderness or step off.  there is no midline tenderness there are no lesions noted. there is no CVA tenderness Musculoskeletal: No lower extremity tenderness, no upper extremity tenderness. No joint effusions, no DVT signs strong distal pulses no edema Neurologic:  Normal speech and language. No gross focal neurologic deficits are appreciated.  Skin:  Skin is warm, dry and intact. No rash noted. Psychiatric: Mood and affect are normal. Speech and behavior are normal.  ____________________________________________   LABS (all labs ordered are listed, but only abnormal results are displayed)  Labs Reviewed  COMPREHENSIVE METABOLIC PANEL - Abnormal; Notable for the following components:      Result Value   Total Protein 8.4 (*)    All other components within normal limits  CBC - Abnormal; Notable for the following components:   WBC 12.0 (*)    MCV 73.1 (*)    MCH 23.9 (*)    RDW 16.6 (*)    Platelets 477 (*)    All other components within normal limits  URINALYSIS, COMPLETE (UACMP) WITH MICROSCOPIC - Abnormal; Notable for the following components:   Color, Urine YELLOW (*)    APPearance CLOUDY (*)    Leukocytes, UA LARGE (*)    Bacteria, UA FEW (*)    All other components within normal limits  LIPASE, BLOOD  POCT PREGNANCY, URINE  POC URINE PREG, ED  POC URINE PREG, ED    Pertinent labs  results that were available during my care of the patient were reviewed by me and considered in my medical decision making (see chart for details). ____________________________________________  EKG  I personally interpreted any EKGs ordered by me or triage  ____________________________________________  RADIOLOGY  Pertinent labs & imaging results that were available during my care of the patient were reviewed by me and considered in my medical decision making (see chart for details). If possible, patient and/or family made aware of any abnormal findings.  No results  found. ____________________________________________    PROCEDURES  Procedure(s) performed: None  Procedures  Critical Care performed: None  ____________________________________________   INITIAL IMPRESSION / ASSESSMENT AND PLAN / ED COURSE  Pertinent labs & imaging results that were available during my care of the patient were reviewed by me and considered in my medical decision making (see chart for details).  She with epigastric and right upper quadrant abdominal pain.  Differential includes PUD, gallbladder disease etc.  White count is mildly elevated which is nonspecific, blood work is otherwise reassuring, she has no flank pain although she may have a UTI urine culture will be sent.  ----------------------------------------- 9:17 PM on 03/12/2018 -----------------------------------------  Patient in no acute distress no peritoneal signs, able to jump up and down, pull herself up and out of the bed using her legs with no evidence of discomfort.  Still has epigastric and right upper quadrant abdominal discomfort ultrasound blood work is all reassuring.  Do not  think this represents ACS PE or dissection.  There is no evidence of intrathoracic pathology.  Patient does not wish pain medications here.  I did offer her a CT scan for the possibility of an atypical appendicitis but she declines.  Patient states that she would like to go home.  She understands that I cannot rule out appendicitis or other intra-abdominal pathology without that.  However, at this time she is in no acute distress, tolerating p.o. we will discharge her at her request.  Family and she do not wish to stay for a CT scan which I do not think is unreasonable but does limit my work-up.  For this reason extensive return precautions and follow-up of been given and understood.  Is no lower abdominal pain or tenderness to suggest appendicitis or PID  Considering the patient's symptoms, medical history, and physical  examination today, I have low suspicion for cholecystitis or biliary pathology, pancreatitis, perforation or bowel obstruction, hernia, intra-abdominal abscess, AAA or dissection, volvulus or intussusception, mesenteric ischemia, ischemic gut, pyelonephritis or appendicitis.    ____________________________________________   FINAL CLINICAL IMPRESSION(S) / ED DIAGNOSES  Final diagnoses:  Abdominal pain      This chart was dictated using voice recognition software.  Despite best efforts to proofread,  errors can occur which can change meaning.      Schuyler Amor, MD 03/12/18 2040    Schuyler Amor, MD 03/12/18 2118    Schuyler Amor, MD 03/12/18 2118

## 2018-03-14 LAB — URINE CULTURE

## 2018-04-30 ENCOUNTER — Encounter: Payer: Self-pay | Admitting: Emergency Medicine

## 2018-04-30 ENCOUNTER — Inpatient Hospital Stay
Admission: EM | Admit: 2018-04-30 | Discharge: 2018-05-03 | DRG: 871 | Disposition: A | Discharge status: Other Acute Inpatient Hospital | Payer: Self-pay | Attending: Internal Medicine | Admitting: Internal Medicine

## 2018-04-30 ENCOUNTER — Other Ambulatory Visit: Payer: Self-pay

## 2018-04-30 ENCOUNTER — Emergency Department: Payer: Self-pay

## 2018-04-30 DIAGNOSIS — N170 Acute kidney failure with tubular necrosis: Secondary | ICD-10-CM | POA: Diagnosis not present

## 2018-04-30 DIAGNOSIS — R609 Edema, unspecified: Secondary | ICD-10-CM

## 2018-04-30 DIAGNOSIS — F17290 Nicotine dependence, other tobacco product, uncomplicated: Secondary | ICD-10-CM | POA: Diagnosis present

## 2018-04-30 DIAGNOSIS — Z880 Allergy status to penicillin: Secondary | ICD-10-CM

## 2018-04-30 DIAGNOSIS — R05 Cough: Secondary | ICD-10-CM

## 2018-04-30 DIAGNOSIS — I10 Essential (primary) hypertension: Secondary | ICD-10-CM | POA: Diagnosis present

## 2018-04-30 DIAGNOSIS — R0902 Hypoxemia: Secondary | ICD-10-CM

## 2018-04-30 DIAGNOSIS — R059 Cough, unspecified: Secondary | ICD-10-CM

## 2018-04-30 DIAGNOSIS — R6521 Severe sepsis with septic shock: Secondary | ICD-10-CM | POA: Diagnosis not present

## 2018-04-30 DIAGNOSIS — J181 Lobar pneumonia, unspecified organism: Secondary | ICD-10-CM | POA: Diagnosis present

## 2018-04-30 DIAGNOSIS — J80 Acute respiratory distress syndrome: Secondary | ICD-10-CM | POA: Diagnosis not present

## 2018-04-30 DIAGNOSIS — A419 Sepsis, unspecified organism: Principal | ICD-10-CM | POA: Diagnosis present

## 2018-04-30 DIAGNOSIS — Z6841 Body Mass Index (BMI) 40.0 and over, adult: Secondary | ICD-10-CM

## 2018-04-30 DIAGNOSIS — J9601 Acute respiratory failure with hypoxia: Secondary | ICD-10-CM

## 2018-04-30 DIAGNOSIS — J189 Pneumonia, unspecified organism: Secondary | ICD-10-CM | POA: Diagnosis present

## 2018-04-30 DIAGNOSIS — D649 Anemia, unspecified: Secondary | ICD-10-CM | POA: Diagnosis present

## 2018-04-30 DIAGNOSIS — Z8249 Family history of ischemic heart disease and other diseases of the circulatory system: Secondary | ICD-10-CM

## 2018-04-30 DIAGNOSIS — Z01818 Encounter for other preprocedural examination: Secondary | ICD-10-CM

## 2018-04-30 LAB — CBC WITH DIFFERENTIAL/PLATELET
ABS IMMATURE GRANULOCYTES: 0.06 10*3/uL (ref 0.00–0.07)
BASOS ABS: 0 10*3/uL (ref 0.0–0.1)
Basophils Relative: 0 %
EOS PCT: 1 %
Eosinophils Absolute: 0.1 10*3/uL (ref 0.0–0.5)
HEMATOCRIT: 35 % — AB (ref 36.0–46.0)
HEMOGLOBIN: 11.4 g/dL — AB (ref 12.0–15.0)
Immature Granulocytes: 0 %
LYMPHS ABS: 1.4 10*3/uL (ref 0.7–4.0)
LYMPHS PCT: 9 %
MCH: 23.8 pg — ABNORMAL LOW (ref 26.0–34.0)
MCHC: 32.6 g/dL (ref 30.0–36.0)
MCV: 73.2 fL — ABNORMAL LOW (ref 80.0–100.0)
Monocytes Absolute: 0.7 10*3/uL (ref 0.1–1.0)
Monocytes Relative: 5 %
NRBC: 0 % (ref 0.0–0.2)
Neutro Abs: 13.3 10*3/uL — ABNORMAL HIGH (ref 1.7–7.7)
Neutrophils Relative %: 85 %
Platelets: 408 10*3/uL — ABNORMAL HIGH (ref 150–400)
RBC: 4.78 MIL/uL (ref 3.87–5.11)
RDW: 16.9 % — ABNORMAL HIGH (ref 11.5–15.5)
WBC: 15.5 10*3/uL — ABNORMAL HIGH (ref 4.0–10.5)

## 2018-04-30 LAB — POCT PREGNANCY, URINE: PREG TEST UR: NEGATIVE

## 2018-04-30 LAB — COMPREHENSIVE METABOLIC PANEL
ALBUMIN: 4 g/dL (ref 3.5–5.0)
ALK PHOS: 61 U/L (ref 38–126)
ALT: 22 U/L (ref 0–44)
AST: 41 U/L (ref 15–41)
Anion gap: 10 (ref 5–15)
BILIRUBIN TOTAL: 0.7 mg/dL (ref 0.3–1.2)
BUN: 9 mg/dL (ref 6–20)
CALCIUM: 9.1 mg/dL (ref 8.9–10.3)
CO2: 26 mmol/L (ref 22–32)
CREATININE: 0.71 mg/dL (ref 0.44–1.00)
Chloride: 103 mmol/L (ref 98–111)
GFR calc Af Amer: >60 mL/min (ref >60)
GFR calc non Af Amer: >60 mL/min (ref >60)
GLUCOSE: 118 mg/dL — AB (ref 70–99)
Potassium: 4 mmol/L (ref 3.5–5.1)
Sodium: 139 mmol/L (ref 135–145)
TOTAL PROTEIN: 8 g/dL (ref 6.5–8.1)

## 2018-04-30 LAB — URINALYSIS, COMPLETE (UACMP) WITH MICROSCOPIC
Bilirubin Urine: NEGATIVE
Glucose, UA: NEGATIVE mg/dL
Hgb urine dipstick: NEGATIVE
Ketones, ur: NEGATIVE mg/dL
Nitrite: NEGATIVE
PH: 5 (ref 5.0–8.0)
Protein, ur: NEGATIVE mg/dL
SPECIFIC GRAVITY, URINE: 1.014 (ref 1.005–1.030)
Squamous Epithelial / LPF: >50 — ABNORMAL HIGH (ref 0–5)
WBC, UA: >50 WBC/hpf — ABNORMAL HIGH (ref 0–5)

## 2018-04-30 LAB — INFLUENZA PANEL BY PCR (TYPE A & B)
INFLAPCR: NEGATIVE
INFLBPCR: NEGATIVE

## 2018-04-30 LAB — HEMOGLOBIN A1C
Hgb A1c MFr Bld: 5.3 % (ref 4.8–5.6)
Mean Plasma Glucose: 105.41 mg/dL

## 2018-04-30 LAB — TSH: TSH: 2.652 u[IU]/mL (ref 0.350–4.500)

## 2018-04-30 LAB — LACTIC ACID, PLASMA: Lactic Acid, Venous: 2.8 mmol/L (ref 0.5–1.9)

## 2018-04-30 MED ORDER — ONDANSETRON HCL 4 MG PO TABS: 4.0000 mg | ORAL_TABLET | Freq: Four times a day (QID) | ORAL | Status: DC | PRN

## 2018-04-30 MED ORDER — DEXTROSE 5 % IV SOLN
250.0000 mg | INTRAVENOUS | Status: DC
  Administered 2018-05-01: 250 mg via INTRAVENOUS
  Filled 2018-04-30 (×2): qty 250

## 2018-04-30 MED ORDER — ONDANSETRON HCL 4 MG/2ML IJ SOLN
4.0000 mg | Freq: Four times a day (QID) | INTRAMUSCULAR | Status: DC | PRN
  Administered 2018-05-02: 4 mg via INTRAVENOUS
  Filled 2018-04-30: qty 2

## 2018-04-30 MED ORDER — ACETAMINOPHEN 650 MG RE SUPP
650.0000 mg | Freq: Four times a day (QID) | RECTAL | Status: DC | PRN
  Administered 2018-05-02 (×2): 650 mg via RECTAL
  Filled 2018-04-30 (×2): qty 1

## 2018-04-30 MED ORDER — SODIUM CHLORIDE 0.9 % IV SOLN
1.0000 g | Freq: Once | INTRAVENOUS | Status: AC
  Administered 2018-04-30: 1 g via INTRAVENOUS
  Filled 2018-04-30: qty 10

## 2018-04-30 MED ORDER — SODIUM CHLORIDE 0.9 % IV BOLUS
1000.0000 mL | Freq: Once | INTRAVENOUS | Status: AC
  Administered 2018-05-02: 1000 mL via INTRAVENOUS

## 2018-04-30 MED ORDER — ACETAMINOPHEN 500 MG PO TABS
1000.0000 mg | ORAL_TABLET | Freq: Once | ORAL | Status: AC
  Administered 2018-04-30: 1000 mg via ORAL

## 2018-04-30 MED ORDER — ACETAMINOPHEN 325 MG PO TABS
650.0000 mg | ORAL_TABLET | Freq: Four times a day (QID) | ORAL | Status: DC | PRN
  Administered 2018-05-01 – 2018-05-02 (×3): 650 mg via ORAL
  Filled 2018-04-30 (×4): qty 2

## 2018-04-30 MED ORDER — IBUPROFEN 600 MG PO TABS
600.0000 mg | ORAL_TABLET | Freq: Once | ORAL | Status: AC
  Administered 2018-04-30: 600 mg via ORAL
  Filled 2018-04-30: qty 1

## 2018-04-30 MED ORDER — INFLUENZA VAC SPLIT QUAD 0.5 ML IM SUSY: 0.5000 mL | PREFILLED_SYRINGE | INTRAMUSCULAR | Status: DC

## 2018-04-30 MED ORDER — SODIUM CHLORIDE 0.9 % IV SOLN
1.0000 g | INTRAVENOUS | Status: DC
  Administered 2018-05-01: 1 g via INTRAVENOUS
  Filled 2018-04-30: qty 1
  Filled 2018-04-30: qty 10

## 2018-04-30 MED ORDER — SODIUM CHLORIDE 0.9 % IV BOLUS
1000.0000 mL | Freq: Once | INTRAVENOUS | Status: AC
  Administered 2018-04-30: 1000 mL via INTRAVENOUS

## 2018-04-30 MED ORDER — DOCUSATE SODIUM 100 MG PO CAPS
100.0000 mg | ORAL_CAPSULE | Freq: Two times a day (BID) | ORAL | Status: DC
  Administered 2018-04-30 – 2018-05-02 (×3): 100 mg via ORAL
  Filled 2018-04-30 (×4): qty 1

## 2018-04-30 MED ORDER — ENOXAPARIN SODIUM 40 MG/0.4ML ~~LOC~~ SOLN
40.0000 mg | Freq: Two times a day (BID) | SUBCUTANEOUS | Status: DC
  Administered 2018-04-30 – 2018-05-02 (×4): 40 mg via SUBCUTANEOUS
  Filled 2018-04-30 (×6): qty 0.4

## 2018-04-30 MED ORDER — ACETAMINOPHEN 500 MG PO TABS
ORAL_TABLET | ORAL | Status: AC
  Filled 2018-04-30: qty 1

## 2018-04-30 MED ORDER — SODIUM CHLORIDE 0.9 % IV SOLN
INTRAVENOUS | Status: DC
  Administered 2018-04-30 – 2018-05-02 (×4): via INTRAVENOUS

## 2018-04-30 MED ORDER — SODIUM CHLORIDE 0.9 % IV SOLN
500.0000 mg | Freq: Once | INTRAVENOUS | Status: AC
  Administered 2018-04-30: 500 mg via INTRAVENOUS
  Filled 2018-04-30: qty 500

## 2018-04-30 NOTE — ED Notes (Signed)
Report off to [place] rn floor nurse

## 2018-04-30 NOTE — Progress Notes (Signed)
Anticoagulation monitoring(Lovenox):  22 yo female ordered Lovenox 40 mg Q24h  Filed Weights   04/30/18 1639  Weight: 250 lb (113.4 kg)   BMI 47.3   Lab Results  Component Value Date   CREATININE 0.71 04/30/2018   CREATININE 0.71 03/12/2018   CREATININE 0.80 04/24/2016   Estimated Creatinine Clearance: 129.9 mL/min (by C-G formula based on SCr of 0.71 mg/dL). Hemoglobin & Hematocrit     Component Value Date/Time   HGB 11.4 (L) 04/30/2018 1649   HGB 12.4 06/06/2013 2001   HCT 35.0 (L) 04/30/2018 1649   HCT 36.3 06/06/2013 2001     Per Protocol for Patient with estCrcl > 30 ml/min and BMI > 40, will transition to Lovenox 40 mg Q12h.

## 2018-04-30 NOTE — ED Notes (Signed)
Pt alert and watching tv with family.  Iv fluids infusing.   Sinus on monitor.  Pt alert.

## 2018-04-30 NOTE — Progress Notes (Signed)
Lab called lactic acid level of 2.8 on patient. I notified Dr Amelia Jo and she was not concerned. She asked if patient was on antibiotics and she is. Repeat level will be drawn in 3 hours, she said OK.

## 2018-04-30 NOTE — H&P (Signed)
Yesenia Carrillo is an 22 y.o. female.   Chief Complaint: Fever HPI: The patient with past medical history of hypertension presents to the emergency department after feeling chills and sweats.  She reports that she began feeling numbness in her fingers and toes last night.  She slept with her heater on all night yet awoke feeling cold and sweaty.  She denies nausea, vomiting or diarrhea.  She admits to infrequent cough that is barely productive of phlegm.  Chest x-ray in the emergency department showed right upper lobe opacity.  The patient met criteria for sepsis and IV antibiotics were started prior to the emergency department staff calling the hospitalist service for admission.  Past Medical History:  Diagnosis Date  . Hypertension     Past Surgical History:  Procedure Laterality Date  . TONSILLECTOMY    . WISDOM TOOTH EXTRACTION      No family history on file. Multiple family members with HTN  Social History:  reports that she has been smoking cigars. She has never used smokeless tobacco. She reports that she drinks alcohol. She reports that she does not use drugs.  Allergies:  Allergies  Allergen Reactions  . Ampicillin Rash and Other (See Comments)    Patient and family cannot remember the specifics of the reaction.  . Penicillins Hives    Medications Prior to Admission  Medication Sig Dispense Refill  . hydrochlorothiazide (HYDRODIURIL) 25 MG tablet Take 1 tablet (25 mg total) by mouth daily. 30 tablet 0  . albuterol (PROVENTIL HFA;VENTOLIN HFA) 108 (90 Base) MCG/ACT inhaler Inhale 2 puffs into the lungs every 6 (six) hours as needed for wheezing or shortness of breath. 1 Inhaler 0  . azithromycin (ZITHROMAX Z-PAK) 250 MG tablet 2 pills today then 1 pill a day for 4 days 6 each 0  . benzonatate (TESSALON) 200 MG capsule Take 1 capsule (200 mg total) by mouth 3 (three) times daily as needed for cough. 30 capsule 0  . famotidine (PEPCID) 20 MG tablet Take 1 tablet (20 mg total) by  mouth 2 (two) times daily for 15 days. 30 tablet 0  . methylPREDNISolone (MEDROL DOSEPAK) 4 MG TBPK tablet Take 6 pills on day one then decrease by 1 pill each day 21 tablet 0    Results for orders placed or performed during the hospital encounter of 04/30/18 (from the past 48 hour(s))  Urinalysis, Complete w Microscopic     Status: Abnormal   Collection Time: 04/30/18  4:43 PM  Result Value Ref Range   Color, Urine YELLOW (A) YELLOW   APPearance TURBID (A) CLEAR   Specific Gravity, Urine 1.014 1.005 - 1.030   pH 5.0 5.0 - 8.0   Glucose, UA NEGATIVE NEGATIVE mg/dL   Hgb urine dipstick NEGATIVE NEGATIVE   Bilirubin Urine NEGATIVE NEGATIVE   Ketones, ur NEGATIVE NEGATIVE mg/dL   Protein, ur NEGATIVE NEGATIVE mg/dL   Nitrite NEGATIVE NEGATIVE   Leukocytes, UA LARGE (A) NEGATIVE   RBC / HPF 11-20 0 - 5 RBC/hpf   WBC, UA >50 (H) 0 - 5 WBC/hpf   Bacteria, UA RARE (A) NONE SEEN   Squamous Epithelial / LPF >50 (H) 0 - 5   Mucus PRESENT     Comment: Performed at [facility], [address] [place], [place] [address]  CBC with Differential     Status: Abnormal   Collection Time: 04/30/18  4:49 PM  Result Value Ref Range   WBC 15.5 (H) 4.0 - 10.5 K/uL     RBC 4.78 3.87 - 5.11 MIL/uL   Hemoglobin 11.4 (L) 12.0 - 15.0 g/dL   HCT 35.0 (L) 36.0 - 46.0 %   MCV 73.2 (L) 80.0 - 100.0 fL   MCH 23.8 (L) 26.0 - 34.0 pg   MCHC 32.6 30.0 - 36.0 g/dL   RDW 16.9 (H) 11.5 - 15.5 %   Platelets 408 (H) 150 - 400 K/uL   nRBC 0.0 0.0 - 0.2 %   Neutrophils Relative % 85 %   Neutro Abs 13.3 (H) 1.7 - 7.7 K/uL   Lymphocytes Relative 9 %   Lymphs Abs 1.4 0.7 - 4.0 K/uL   Monocytes Relative 5 %   Monocytes Absolute 0.7 0.1 - 1.0 K/uL   Eosinophils Relative 1 %   Eosinophils Absolute 0.1 0.0 - 0.5 K/uL   Basophils Relative 0 %   Basophils Absolute 0.0 0.0 - 0.1 K/uL   Immature Granulocytes 0 %   Abs Immature Granulocytes 0.06 0.00 - 0.07 K/uL    Comment: Performed at [facility],  [address] [place], [place] [address]  Comprehensive metabolic panel     Status: Abnormal   Collection Time: 04/30/18  4:49 PM  Result Value Ref Range   Sodium 139 135 - 145 mmol/L   Potassium 4.0 3.5 - 5.1 mmol/L   Chloride 103 98 - 111 mmol/L   CO2 26 22 - 32 mmol/L   Glucose, Bld 118 (H) 70 - 99 mg/dL   BUN 9 6 - 20 mg/dL   Creatinine, Ser 0.71 0.44 - 1.00 mg/dL   Calcium 9.1 8.9 - 10.3 mg/dL   Total Protein 8.0 6.5 - 8.1 g/dL   Albumin 4.0 3.5 - 5.0 g/dL   AST 41 15 - 41 U/L   ALT 22 0 - 44 U/L   Alkaline Phosphatase 61 38 - 126 U/L   Total Bilirubin 0.7 0.3 - 1.2 mg/dL   GFR calc non Af Amer >60 >60 mL/min   GFR calc Af Amer >60 >60 mL/min   Anion gap 10 5 - 15    Comment: Performed at [facility], [address] [place], [place] [address]  TSH     Status: None   Collection Time: 04/30/18  4:49 PM  Result Value Ref Range   TSH 2.652 0.350 - 4.500 uIU/mL    Comment: Performed by a 3rd Generation assay with a functional sensitivity of <=0.01 uIU/mL. Performed at [facility], [address] [place], [place] [address]   Pregnancy, urine POC     Status: None   Collection Time: 04/30/18  4:56 PM  Result Value Ref Range   Preg Test, Ur NEGATIVE NEGATIVE    Comment:        THE SENSITIVITY OF THIS METHODOLOGY IS >24 mIU/mL   Influenza panel by PCR (type A & B)     Status: None   Collection Time: 04/30/18  5:16 PM  Result Value Ref Range   Influenza A By PCR NEGATIVE NEGATIVE   Influenza B By PCR NEGATIVE NEGATIVE    Comment: (NOTE) The Xpert Xpress Flu assay is intended as an aid in the diagnosis of  influenza and should not be used as a sole basis for treatment.  This  assay is FDA approved for nasopharyngeal swab specimens only. Nasal  washings and aspirates are unacceptable for Xpert Xpress Flu testing. Performed at [facility], [address], [place], [place] [address]    Dg Chest 2 View  Result  Date: 04/30/2018 CLINICAL  DATA:  Cough and fever EXAM: CHEST - 2 VIEW COMPARISON:  09/18/2017 FINDINGS: Patchy right upper lobe opacity. No pleural effusion. Normal heart size. No pneumothorax. IMPRESSION: Patchy right upper lobe opacity may reflect a small pneumonia Electronically Signed   By: Kim  Fujinaga M.D.   On: 04/30/2018 18:40    Review of Systems  Constitutional: Positive for fever. Negative for chills.  HENT: Negative for sore throat and tinnitus.   Eyes: Negative for blurred vision and redness.  Respiratory: Negative for cough and shortness of breath.   Cardiovascular: Negative for chest pain, palpitations, orthopnea and PND.  Gastrointestinal: Negative for abdominal pain, diarrhea, nausea and vomiting.  Genitourinary: Negative for dysuria, frequency and urgency.  Musculoskeletal: Negative for joint pain and myalgias.  Skin: Negative for rash.       No lesions  Neurological: Negative for speech change, focal weakness and weakness.  Endo/Heme/Allergies: Does not bruise/bleed easily.       No temperature intolerance  Psychiatric/Behavioral: Negative for depression and suicidal ideas.    Blood pressure 128/79, pulse (!) 117, temperature 98.9 F (37.2 C), temperature source Oral, resp. rate 16, height 5' 1" (1.549 m), weight 113.4 kg, last menstrual period 03/30/2018, SpO2 99 %. Physical Exam  Vitals reviewed. Constitutional: She is oriented to person, place, and time. She appears well-developed and well-nourished. No distress.  HENT:  Head: Normocephalic and atraumatic.  Mouth/Throat: Oropharynx is clear and moist.  Eyes: Pupils are equal, round, and reactive to light. Conjunctivae and EOM are normal. No scleral icterus.  Neck: Normal range of motion. Neck supple. No tracheal deviation present. No thyromegaly present.  Cardiovascular: Normal rate, regular rhythm and normal heart sounds. Exam reveals no gallop and no friction rub.  No murmur heard. Respiratory: Effort normal and breath sounds normal.   GI: Soft. Bowel sounds are normal. She exhibits no distension. There is no tenderness.  Genitourinary:  Genitourinary Comments: Deferred  Musculoskeletal: Normal range of motion. She exhibits no edema.  Lymphadenopathy:    She has no cervical adenopathy.  Neurological: She is alert and oriented to person, place, and time. No cranial nerve deficit. She exhibits normal muscle tone.  Skin: Skin is warm and dry. No rash noted. No erythema.  Psychiatric: She has a normal mood and affect. Judgment and thought content normal.     Assessment/Plan This is a 22-year-old female admitted for pneumonia. 1.  Pneumonia: Community-acquired; continue ceftriaxone and azithromycin.  Transition to oral antibiotics.  Albuterol as needed 2.  Sepsis: Patient meets criteria via fever, tachycardia, tachypnea and leukocytosis.  She is hemodynamically stable.  Check lactic acid.  Follow blood cultures for growth and sensitivities. 3.  Hypertension: Controlled; continue hydrochlorothiazide 4.  DVT prophylaxis: Lovenox 5.  GI prophylaxis: None Exline the patient is a full code.  Time spent on admission orders and patient care approximately 45 minutes  Diamond,  Michael S, MD 04/30/2018, 10:37 PM   

## 2018-04-30 NOTE — ED Triage Notes (Signed)
Pt arrived with complaints of headache/sinus pressure and weakness since yesterday. Pt states she feels like she is going to pass out. PT tachycardia with a fever in triage.

## 2018-04-30 NOTE — ED Notes (Signed)
Pt reports a headache and cough since yesterday.   cig smoker.  Fever today.  No otc med for fever today.  Pt denies sob or chest pain.  Iv started and labs sent.  Sinus tach on monitor.  Family with pt.

## 2018-04-30 NOTE — ED Provider Notes (Signed)
[facility] Emergency Department Provider Note  ____________________________________________   I have reviewed the triage vital signs and the nursing notes.   HISTORY  Chief Complaint Fever   History limited by: Not Limited   HPI Yesenia Carrillo is a 22 y.o. female who presents to the emergency department today with complaint for fever, cough, headache, feeling unwell.  Patient states that these symptoms started 2 days ago.  Her symptoms she knows was a cough.  She feels like she is trying to get congestion up however has not had any production yet.  She has been taking some Mucinex for the cough without any significant relief.  Since then she is also developed a fevers and headache.  The patient denies taking any other medication for her symptoms.  She states that she has had family members with similar symptoms although there is appeared to not be quite as severe.  Patient denies any history of lung disease.   Per medical record review patient has a history of htn  Past Medical History:  Diagnosis Date  . Hypertension     There are no active problems to display for this patient.   Past Surgical History:  Procedure Laterality Date  . TONSILLECTOMY    . WISDOM TOOTH EXTRACTION      Prior to Admission medications   Medication Sig Start Date End Date Taking? Authorizing Provider  albuterol (PROVENTIL HFA;VENTOLIN HFA) 108 (90 Base) MCG/ACT inhaler Inhale 2 puffs into the lungs every 6 (six) hours as needed for wheezing or shortness of breath. 04/24/16   Menshew, Dannielle Karvonen, PA-C  azithromycin (ZITHROMAX Z-PAK) 250 MG tablet 2 pills today then 1 pill a day for 4 days 09/18/17   Caryn Section Linden Dolin, PA-C  benzonatate (TESSALON) 200 MG capsule Take 1 capsule (200 mg total) by mouth 3 (three) times daily as needed for cough. 09/18/17   Fisher, Linden Dolin, PA-C  famotidine (PEPCID) 20 MG tablet Take 1 tablet (20 mg total) by mouth 2 (two) times daily for 15 days.  03/12/18 03/27/18  Schuyler Amor, MD  hydrochlorothiazide (HYDRODIURIL) 25 MG tablet Take 1 tablet (25 mg total) by mouth daily. Patient not taking: Reported on 09/25/2015 09/24/15   Lavonia Drafts, MD  methylPREDNISolone (MEDROL DOSEPAK) 4 MG TBPK tablet Take 6 pills on day one then decrease by 1 pill each day 09/18/17   Versie Starks, PA-C    Allergies Penicillins and Ampicillin  No family history on file.  Social History Social History   Tobacco Use  . Smoking status: Current Every Day Smoker    Types: Cigars  . Smokeless tobacco: Never Used  Substance Use Topics  . Alcohol use: Yes    Comment: monthly  . Drug use: No    Review of Systems Constitutional: Positive for fever Eyes: No visual changes. ENT: Positive for sore throat Cardiovascular: Denies chest pain. Respiratory: Positive for cough Gastrointestinal: No abdominal pain.  No nausea, no vomiting.  No diarrhea.   Genitourinary: Negative for dysuria. Musculoskeletal: Negative for back pain. Skin: Negative for rash. Neurological: Positive for headache  ____________________________________________   PHYSICAL EXAM:  VITAL SIGNS: ED Triage Vitals  Enc Vitals Group     BP 04/30/18 1626 (!) 151/107     Pulse --      Resp 04/30/18 1626 (!) 26     Temp 04/30/18 1626 (!) 102.6 F (39.2 C)     Temp src --      SpO2 04/30/18 1626  95 %     Weight 04/30/18 1639 250 lb (113.4 kg)     Height 04/30/18 1639 5\' 1"  (1.549 m)     Head Circumference --      Peak Flow --      Pain Score 04/30/18 1638 9     Pain Loc --      Pain Edu? --      Excl. in [place]? --      Constitutional: Alert and oriented.  Eyes: Conjunctivae are normal.  ENT      Head: Normocephalic and atraumatic.      Nose: No congestion/rhinnorhea.      Mouth/Throat: Mucous membranes are moist.      Neck: No stridor. Hematological/Lymphatic/Immunilogical: No cervical lymphadenopathy. Cardiovascular: Tachycardic, regular rhythm.  No murmurs, rubs, or  gallops. Respiratory: Tachypnea. Breath sounds are clear and equal bilaterally. No wheezes/rales/rhonchi. Gastrointestinal: Soft and non tender. No rebound. No guarding.  Genitourinary: Deferred Musculoskeletal: Normal range of motion in all extremities. No lower extremity edema. Neurologic:  Normal speech and language. No gross focal neurologic deficits are appreciated.  Skin:  Skin is warm, dry and intact. No rash noted. Psychiatric: Mood and affect are normal. Speech and behavior are normal. Patient exhibits appropriate insight and judgment.  ____________________________________________    LABS (pertinent positives/negatives)  CBC wbc 15.5, hgb 11.4, plt 408 Upreg neg CMP wnl except glu 118 Influenza negative ____________________________________________   EKG  I, Nance Pear, attending physician, personally viewed and interpreted this EKG  EKG Time: 1630 Rate: 152 Rhythm: sinus tachycardia Axis: normal Intervals: qtc 496 QRS: narrow ST changes: no st elevation Impression: abnormal ekg   ____________________________________________    RADIOLOGY  CXR Concern for pneumonia  ____________________________________________   PROCEDURES  Procedures  ____________________________________________   INITIAL IMPRESSION / ASSESSMENT AND PLAN / ED COURSE  Pertinent labs & imaging results that were available during my care of the patient were reviewed by me and considered in my medical decision making (see chart for details).   Patient presents because of concern for fevers, headache, fatigue, cough. Initial vital signs notable for tachycardia, fever. Patient with leukocytosis. Work up consistent with pneumonia. UA also with some concerning findings however patient denies urinary symptoms. Patient will be given IV abx including rocephin. Patient states only rash with pcn type abx. Discussed findings with patient.     ____________________________________________   FINAL CLINICAL IMPRESSION(S) / ED DIAGNOSES  Final diagnoses:  Pneumonia due to infectious organism, unspecified laterality, unspecified part of lung     Note: This dictation was prepared with Dragon dictation. Any transcriptional errors that result from this process are unintentional     Nance Pear, MD 04/30/18 1944

## 2018-05-01 LAB — LACTIC ACID, PLASMA: Lactic Acid, Venous: 0.9 mmol/L (ref 0.5–1.9)

## 2018-05-01 MED ORDER — GUAIFENESIN-DM 100-10 MG/5ML PO SYRP
15.0000 mL | ORAL_SOLUTION | Freq: Four times a day (QID) | ORAL | Status: DC
  Administered 2018-05-01 (×3): 15 mL via ORAL
  Filled 2018-05-01 (×4): qty 15

## 2018-05-01 MED ORDER — METOPROLOL TARTRATE 5 MG/5ML IV SOLN
5.0000 mg | Freq: Once | INTRAVENOUS | Status: AC
  Administered 2018-05-01: 5 mg via INTRAVENOUS
  Filled 2018-05-01: qty 5

## 2018-05-01 MED ORDER — HYDROCOD POLST-CPM POLST ER 10-8 MG/5ML PO SUER
5.0000 mL | Freq: Two times a day (BID) | ORAL | Status: DC
  Administered 2018-05-01 – 2018-05-02 (×3): 5 mL via ORAL
  Filled 2018-05-01 (×3): qty 5

## 2018-05-01 MED ORDER — GUAIFENESIN-DM 100-10 MG/5ML PO SYRP
5.0000 mL | ORAL_SOLUTION | ORAL | Status: DC | PRN
  Administered 2018-05-01: 5 mL via ORAL
  Filled 2018-05-01: qty 5

## 2018-05-01 MED ORDER — BENZONATATE 100 MG PO CAPS
100.0000 mg | ORAL_CAPSULE | Freq: Three times a day (TID) | ORAL | Status: DC
  Administered 2018-05-01 – 2018-05-02 (×4): 100 mg via ORAL
  Filled 2018-05-01 (×4): qty 1

## 2018-05-01 MED ORDER — METOPROLOL TARTRATE 25 MG PO TABS
25.0000 mg | ORAL_TABLET | Freq: Three times a day (TID) | ORAL | Status: DC
  Administered 2018-05-01 – 2018-05-02 (×3): 25 mg via ORAL
  Filled 2018-05-01 (×3): qty 1

## 2018-05-01 NOTE — Progress Notes (Signed)
MD notified that pt.'s HR is 150s-160s at this time. Pt is asymptomatic, she is not complaining of any chest pain and is only complaining of a little of SOB with ambulation. VSS. Verbal order to place a one time dose of Metoprolol 5mg  IV. RN will continue to monitor pt closely.   Giovanie Lefebre [employer]

## 2018-05-01 NOTE — Progress Notes (Signed)
[place] at [place] NAME: Yesenia Carrillo    MR#:  [id number]  DATE OF BIRTH:  09-22-1995  SUBJECTIVE:   Patient came in with increasing shortness of breath cough and fever with chills. She was found to have pneumonia. Continues with spells of cough giving her headache. REVIEW OF SYSTEMS:   Review of Systems  Constitutional: Negative for chills, fever and weight loss.  HENT: Negative for ear discharge, ear pain and nosebleeds.   Eyes: Negative for blurred vision, pain and discharge.  Respiratory: Positive for cough and shortness of breath. Negative for sputum production, wheezing and stridor.   Cardiovascular: Negative for chest pain, palpitations, orthopnea and PND.  Gastrointestinal: Negative for abdominal pain, diarrhea, nausea and vomiting.  Genitourinary: Negative for frequency and urgency.  Musculoskeletal: Negative for back pain and joint pain.  Neurological: Positive for headaches. Negative for sensory change, speech change, focal weakness and weakness.  Psychiatric/Behavioral: Negative for depression and hallucinations. The patient is not nervous/anxious.    Tolerating Diet:yes Tolerating PT: not needed  DRUG ALLERGIES:   Allergies  Allergen Reactions  . Ampicillin Rash and Other (See Comments)    Patient and family cannot remember the specifics of the reaction.  Marland Kitchen Penicillins Hives    VITALS:  Blood pressure 133/80, pulse (!) 136, temperature 99.6 F (37.6 C), temperature source Oral, resp. rate 18, height 5\' 1"  (1.549 m), weight 113.4 kg, last menstrual period 03/30/2018, SpO2 92 %.  PHYSICAL EXAMINATION:   Physical Exam  GENERAL:  22 y.o.-year-old patient lying in the bed with no acute distress. obese EYES: Pupils equal, round, reactive to light and accommodation. No scleral icterus. Extraocular muscles intact.  HEENT: Head atraumatic, normocephalic. Oropharynx and nasopharynx clear.  NECK:  Supple, no jugular  venous distention. No thyroid enlargement, no tenderness.  LUNGS: decreased breath sounds bilaterally, no wheezing, rales, rhonchi. No use of accessory muscles of respiration.  CARDIOVASCULAR: S1, S2 normal. No murmurs, rubs, or gallops. tachycardia ABDOMEN: Soft, nontender, nondistended. Bowel sounds present. No organomegaly or mass.  EXTREMITIES: No cyanosis, clubbing or edema b/l.    NEUROLOGIC: Cranial nerves II through XII are intact. No focal Motor or sensory deficits b/l.  PSYCHIATRIC:  patient is alert and oriented x 3.  SKIN: No obvious rash, lesion, or ulcer.   LABORATORY PANEL:  CBC Recent Labs  Lab 04/30/18 1649  WBC 15.5*  HGB 11.4*  HCT 35.0*  PLT 408*    Chemistries  Recent Labs  Lab 04/30/18 1649  NA 139  K 4.0  CL 103  CO2 26  GLUCOSE 118*  BUN 9  CREATININE 0.71  CALCIUM 9.1  AST 41  ALT 22  ALKPHOS 61  BILITOT 0.7   Cardiac Enzymes No results for input(s): TROPONINI in the last 168 hours. RADIOLOGY:  Dg Chest 2 View  Result Date: 04/30/2018 CLINICAL DATA:  Cough and fever EXAM: CHEST - 2 VIEW COMPARISON:  09/18/2017 FINDINGS: Patchy right upper lobe opacity. No pleural effusion. Normal heart size. No pneumothorax. IMPRESSION: Patchy right upper lobe opacity may reflect a small pneumonia Electronically Signed   By: Donavan Foil M.D.   On: 04/30/2018 18:40   ASSESSMENT AND PLAN:  Yesenia Carrillo is a 22 y.o. female who presents to the emergency department today with complaint for fever, cough, headache, feeling unwell.  Patient states that these symptoms started 2 days ago.  Her symptoms she knows was a cough.  She feels like she is trying  to get congestion up however has not had any production yet.   1.  Pneumonia: right Community-acquired; continue ceftriaxone and azithromycin.  Transition to oral antibiotics.  Albuterol as needed -afebrile -scheduled cough syrup -tachycardia due to infection--better after IV BB  2.  Sepsis: Patient meets criteria  via fever, tachycardia, tachypnea and leukocytosis.  She is hemodynamically stable.  -lactic acid x2 negative  Follow blood cultures for growth and sensitivities.  3.  Hypertension: Controlled; continue hydrochlorothiazide  4.  DVT prophylaxis: Lovenox  Case discussed with Care Management/Social Worker. Management plans discussed with the patient, family and they are in agreement.  CODE STATUS: full  DVT Prophylaxis: lovenox  TOTAL TIME TAKING CARE OF THIS PATIENT: *25* minutes.  >50% time spent on counselling and coordination of care  POSSIBLE D/C IN **1-2* DAYS, DEPENDING ON CLINICAL CONDITION.  Note: This dictation was prepared with Dragon dictation along with smaller phrase technology. Any transcriptional errors that result from this process are unintentional.  Fritzi Mandes M.D on 05/01/2018 at 1:05 PM  Between 7am to 6pm - Pager - [phone]  After 6pm go to [email or web address] - password [email or web address] [facility]  Office  [phone]  CC: Primary care physician; [facility] [place] [facility] ID: Yesenia Carrillo, female   DOB: 1996/03/26, 22 y.o.   MRN: [id number]

## 2018-05-01 NOTE — Progress Notes (Signed)
MD notified that pt.'s HR is sustaining in the 130s. Pt is asymptomatic and is resting comfortable in her bed at this time. VSS. Verbal order to place Metoprolol 25 mg TID PO with parameter of holding medication if systolic is less than 237.   Eoin Willden [employer]

## 2018-05-02 ENCOUNTER — Inpatient Hospital Stay: Payer: Self-pay

## 2018-05-02 ENCOUNTER — Inpatient Hospital Stay (HOSPITAL_COMMUNITY)
Admit: 2018-05-02 | Discharge: 2018-05-02 | Disposition: A | Discharge status: Home / Self Care | Payer: Self-pay | Attending: Internal Medicine | Admitting: Internal Medicine

## 2018-05-02 DIAGNOSIS — J9601 Acute respiratory failure with hypoxia: Secondary | ICD-10-CM

## 2018-05-02 DIAGNOSIS — I340 Nonrheumatic mitral (valve) insufficiency: Secondary | ICD-10-CM

## 2018-05-02 LAB — BLOOD GAS, ARTERIAL
Acid-Base Excess: 0.3 mmol/L (ref 0.0–2.0)
Acid-base deficit: 0.6 mmol/L (ref 0.0–2.0)
BICARBONATE: 24.9 mmol/L (ref 20.0–28.0)
BICARBONATE: 27.3 mmol/L (ref 20.0–28.0)
FIO2: 1
FIO2: 1
LHR: 18 {breaths}/min
O2 Saturation: 92.1 %
O2 Saturation: 97.6 %
PEEP: 5 cmH2O
PH ART: 7.37 (ref 7.350–7.450)
PO2 ART: 66 mmHg — AB (ref 83.0–108.0)
Patient temperature: 37
Patient temperature: 37
VT: 500 mL
pCO2 arterial: 43 mmHg (ref 32.0–48.0)
pCO2 arterial: 53 mmHg — ABNORMAL HIGH (ref 32.0–48.0)
pH, Arterial: 7.32 — ABNORMAL LOW (ref 7.350–7.450)
pO2, Arterial: 106 mmHg (ref 83.0–108.0)

## 2018-05-02 LAB — BASIC METABOLIC PANEL
Anion gap: 6 (ref 5–15)
BUN: 9 mg/dL (ref 6–20)
CALCIUM: 7.8 mg/dL — AB (ref 8.9–10.3)
CO2: 26 mmol/L (ref 22–32)
Chloride: 106 mmol/L (ref 98–111)
Creatinine, Ser: 1.19 mg/dL — ABNORMAL HIGH (ref 0.44–1.00)
GFR calc non Af Amer: >60 mL/min (ref >60)
Glucose, Bld: 91 mg/dL (ref 70–99)
Potassium: 4.1 mmol/L (ref 3.5–5.1)
Sodium: 138 mmol/L (ref 135–145)

## 2018-05-02 LAB — CBC
HCT: 30.1 % — ABNORMAL LOW (ref 36.0–46.0)
Hemoglobin: 9.7 g/dL — ABNORMAL LOW (ref 12.0–15.0)
MCH: 24.1 pg — ABNORMAL LOW (ref 26.0–34.0)
MCHC: 32.2 g/dL (ref 30.0–36.0)
MCV: 74.9 fL — ABNORMAL LOW (ref 80.0–100.0)
Platelets: 397 10*3/uL (ref 150–400)
RBC: 4.02 MIL/uL (ref 3.87–5.11)
RDW: 17.3 % — ABNORMAL HIGH (ref 11.5–15.5)
WBC: 37.3 10*3/uL — ABNORMAL HIGH (ref 4.0–10.5)
nRBC: 0 % (ref 0.0–0.2)

## 2018-05-02 LAB — STREP PNEUMONIAE URINARY ANTIGEN: STREP PNEUMO URINARY ANTIGEN: NEGATIVE

## 2018-05-02 LAB — PROCALCITONIN: Procalcitonin: 0.84 ng/mL

## 2018-05-02 LAB — GLUCOSE, CAPILLARY: Glucose-Capillary: 83 mg/dL (ref 70–99)

## 2018-05-02 LAB — MRSA PCR SCREENING: MRSA BY PCR: NEGATIVE

## 2018-05-02 LAB — TRIGLYCERIDES: Triglycerides: 149 mg/dL (ref <150)

## 2018-05-02 MED ORDER — VANCOMYCIN HCL 10 G IV SOLR
1250.0000 mg | Freq: Three times a day (TID) | INTRAVENOUS | Status: DC
  Administered 2018-05-03 (×2): 1250 mg via INTRAVENOUS
  Filled 2018-05-02 (×5): qty 1250

## 2018-05-02 MED ORDER — VANCOMYCIN HCL 10 G IV SOLR
1250.0000 mg | Freq: Once | INTRAVENOUS | Status: AC
  Administered 2018-05-02: 1250 mg via INTRAVENOUS
  Filled 2018-05-02: qty 1250

## 2018-05-02 MED ORDER — ETOMIDATE 2 MG/ML IV SOLN
20.0000 mg | Freq: Once | INTRAVENOUS | Status: AC
  Administered 2018-05-02: 20 mg via INTRAVENOUS

## 2018-05-02 MED ORDER — SODIUM CHLORIDE 0.9 % IV BOLUS
1000.0000 mL | Freq: Once | INTRAVENOUS | Status: AC
  Administered 2018-05-02: 1000 mL via INTRAVENOUS

## 2018-05-02 MED ORDER — FUROSEMIDE 10 MG/ML IJ SOLN
20.0000 mg | Freq: Once | INTRAMUSCULAR | Status: AC
  Administered 2018-05-02: 20 mg via INTRAVENOUS
  Filled 2018-05-02: qty 2

## 2018-05-02 MED ORDER — FENTANYL CITRATE (PF) 100 MCG/2ML IJ SOLN
12.5000 ug | INTRAMUSCULAR | Status: DC | PRN
  Administered 2018-05-02: 12.5 ug via INTRAVENOUS
  Filled 2018-05-02: qty 2

## 2018-05-02 MED ORDER — FENTANYL CITRATE (PF) 100 MCG/2ML IJ SOLN: 100.0000 ug | INTRAMUSCULAR | Status: DC | PRN

## 2018-05-02 MED ORDER — FENTANYL 2500MCG IN NS 250ML (10MCG/ML) PREMIX INFUSION
25.0000 ug/h | INTRAVENOUS | Status: DC
  Administered 2018-05-02: 200 ug/h via INTRAVENOUS
  Administered 2018-05-03 (×2): 350 ug/h via INTRAVENOUS
  Filled 2018-05-02: qty 250

## 2018-05-02 MED ORDER — METOPROLOL TARTRATE 5 MG/5ML IV SOLN
INTRAVENOUS | Status: AC
  Administered 2018-05-02: 5 mg
  Filled 2018-05-02: qty 5

## 2018-05-02 MED ORDER — SUCCINYLCHOLINE CHLORIDE 20 MG/ML IJ SOLN
INTRAMUSCULAR | Status: AC
  Administered 2018-05-02: 50 mg via INTRAVENOUS
  Filled 2018-05-02: qty 1

## 2018-05-02 MED ORDER — FENTANYL CITRATE (PF) 100 MCG/2ML IJ SOLN
INTRAMUSCULAR | Status: AC
  Administered 2018-05-02: 100 ug via INTRAVENOUS
  Filled 2018-05-02: qty 2

## 2018-05-02 MED ORDER — IOPAMIDOL (ISOVUE-370) INJECTION 76%
75.0000 mL | Freq: Once | INTRAVENOUS | Status: AC | PRN
  Administered 2018-05-02: 75 mL via INTRAVENOUS

## 2018-05-02 MED ORDER — NOREPINEPHRINE 4 MG/250ML-% IV SOLN
0.0000 ug/min | INTRAVENOUS | Status: DC
  Administered 2018-05-02: 4 ug/min via INTRAVENOUS
  Filled 2018-05-02: qty 250

## 2018-05-02 MED ORDER — FENTANYL CITRATE (PF) 100 MCG/2ML IJ SOLN: 50.0000 ug | Freq: Once | INTRAMUSCULAR | Status: DC

## 2018-05-02 MED ORDER — IBUPROFEN 400 MG PO TABS
400.0000 mg | ORAL_TABLET | Freq: Four times a day (QID) | ORAL | Status: DC | PRN
  Administered 2018-05-02: 400 mg via ORAL
  Filled 2018-05-02: qty 1

## 2018-05-02 MED ORDER — IBUPROFEN 100 MG/5ML PO SUSP
600.0000 mg | Freq: Four times a day (QID) | ORAL | Status: DC | PRN
  Administered 2018-05-03: 600 mg
  Filled 2018-05-02 (×2): qty 30

## 2018-05-02 MED ORDER — FENTANYL CITRATE (PF) 100 MCG/2ML IJ SOLN
100.0000 ug | Freq: Once | INTRAMUSCULAR | Status: AC
  Administered 2018-05-02: 100 ug via INTRAVENOUS

## 2018-05-02 MED ORDER — LABETALOL HCL 5 MG/ML IV SOLN
10.0000 mg | Freq: Once | INTRAVENOUS | Status: AC
  Administered 2018-05-02: 10 mg via INTRAVENOUS

## 2018-05-02 MED ORDER — SODIUM CHLORIDE 0.9 % IV SOLN: INTRAVENOUS | Status: DC

## 2018-05-02 MED ORDER — FAMOTIDINE IN NACL 20-0.9 MG/50ML-% IV SOLN
20.0000 mg | Freq: Two times a day (BID) | INTRAVENOUS | Status: DC
  Administered 2018-05-02: 20 mg via INTRAVENOUS
  Filled 2018-05-02: qty 50

## 2018-05-02 MED ORDER — METOPROLOL TARTRATE 5 MG/5ML IV SOLN
5.0000 mg | INTRAVENOUS | Status: AC
  Administered 2018-05-02: 5 mg via INTRAVENOUS
  Filled 2018-05-02: qty 5

## 2018-05-02 MED ORDER — SODIUM CHLORIDE 0.9 % IV SOLN
2.0000 g | Freq: Two times a day (BID) | INTRAVENOUS | Status: DC
  Administered 2018-05-02 – 2018-05-03 (×3): 2 g via INTRAVENOUS
  Filled 2018-05-02 (×4): qty 2

## 2018-05-02 MED ORDER — ORAL CARE MOUTH RINSE
15.0000 mL | OROMUCOSAL | Status: DC
  Administered 2018-05-02 – 2018-05-03 (×5): 15 mL via OROMUCOSAL

## 2018-05-02 MED ORDER — SODIUM CHLORIDE 0.9 % IV BOLUS: 1000.0000 mL | Freq: Once | INTRAVENOUS | Status: DC

## 2018-05-02 MED ORDER — NOREPINEPHRINE 16 MG/250ML-% IV SOLN
0.0000 ug/min | INTRAVENOUS | Status: DC
  Administered 2018-05-03: 4 ug/min via INTRAVENOUS
  Administered 2018-05-03: 40 ug/min via INTRAVENOUS
  Filled 2018-05-02 (×2): qty 250

## 2018-05-02 MED ORDER — IPRATROPIUM-ALBUTEROL 0.5-2.5 (3) MG/3ML IN SOLN: 3.0000 mL | RESPIRATORY_TRACT | Status: DC | PRN

## 2018-05-02 MED ORDER — ETOMIDATE 2 MG/ML IV SOLN
INTRAVENOUS | Status: AC
  Administered 2018-05-02: 20 mg via INTRAVENOUS
  Filled 2018-05-02: qty 10

## 2018-05-02 MED ORDER — PROPOFOL 1000 MG/100ML IV EMUL
5.0000 ug/kg/min | INTRAVENOUS | Status: DC
  Administered 2018-05-02: 20 ug/kg/min via INTRAVENOUS
  Administered 2018-05-02: 30 ug/kg/min via INTRAVENOUS
  Administered 2018-05-03 (×2): 35 ug/kg/min via INTRAVENOUS
  Filled 2018-05-02 (×3): qty 100

## 2018-05-02 MED ORDER — CHLORHEXIDINE GLUCONATE 0.12% ORAL RINSE (MEDLINE KIT)
15.0000 mL | Freq: Two times a day (BID) | OROMUCOSAL | Status: DC
  Administered 2018-05-02 – 2018-05-03 (×2): 15 mL via OROMUCOSAL

## 2018-05-02 MED ORDER — FENTANYL BOLUS VIA INFUSION
50.0000 ug | INTRAVENOUS | Status: DC | PRN
  Administered 2018-05-03: 50 ug via INTRAVENOUS
  Filled 2018-05-02: qty 50

## 2018-05-02 MED ORDER — GUAIFENESIN-DM 100-10 MG/5ML PO SYRP
10.0000 mL | ORAL_SOLUTION | Freq: Four times a day (QID) | ORAL | Status: DC
  Administered 2018-05-02 (×2): 10 mL via ORAL
  Filled 2018-05-02 (×5): qty 10

## 2018-05-02 MED ORDER — DILTIAZEM HCL 25 MG/5ML IV SOLN
15.0000 mg | Freq: Once | INTRAVENOUS | Status: AC
  Administered 2018-05-02: 15 mg via INTRAVENOUS
  Filled 2018-05-02: qty 5

## 2018-05-02 MED ORDER — LABETALOL HCL 5 MG/ML IV SOLN
INTRAVENOUS | Status: AC
  Filled 2018-05-02: qty 4

## 2018-05-02 MED ORDER — SUCCINYLCHOLINE CHLORIDE 20 MG/ML IJ SOLN
50.0000 mg | Freq: Once | INTRAMUSCULAR | Status: AC
  Administered 2018-05-02: 50 mg via INTRAVENOUS

## 2018-05-02 MED ORDER — MIDAZOLAM HCL 2 MG/2ML IJ SOLN: 2.0000 mg | INTRAMUSCULAR | Status: DC | PRN

## 2018-05-02 MED ORDER — SODIUM CHLORIDE 0.9 % IV BOLUS: 500.0000 mL | Freq: Once | INTRAVENOUS | Status: DC

## 2018-05-02 MED ORDER — MIDAZOLAM HCL 2 MG/2ML IJ SOLN
2.0000 mg | INTRAMUSCULAR | Status: DC | PRN
  Administered 2018-05-03: 2 mg via INTRAVENOUS
  Filled 2018-05-02: qty 2

## 2018-05-02 NOTE — Procedures (Signed)
Central Venous Catheter Insertion Procedure Note Yesenia Carrillo [id number] [birth year]  Procedure: Insertion of Central Venous Catheter Indications: Assessment of intravascular volume, Drug and/or fluid administration and Frequent blood sampling  Procedure Details Consent: Risks of procedure as well as the alternatives and risks of each were explained to the (patient/caregiver).  Consent for procedure obtained. Time Out: Verified patient identification, verified procedure, site/side was marked, verified correct patient position, special equipment/implants available, medications/allergies/relevent history reviewed, required imaging and test results available.  Performed  Maximum sterile technique was used including antiseptics, cap, gloves, gown, hand hygiene, mask and sheet. Skin prep: Chlorhexidine; local anesthetic administered A antimicrobial bonded/coated triple lumen catheter was placed in the left femoral vein due to multiple attempts, no other available access using the Seldinger technique.  Evaluation Blood flow good Complications: No apparent complications Patient did tolerate procedure well. Chest X-ray ordered to verify placement.  CXR: Not needed, placed in left femoral vein.   Procedure was performed using Ultrasound for direct visualization of cannulization of left femoral vein.    Darel Hong, AGACNP-BC [facility] Pager: [phone] Cell: [phone]   Yesenia Carrillo 05/02/2018, 10:11 PM

## 2018-05-02 NOTE — Progress Notes (Signed)
   05/02/18 1600  Clinical Encounter Type  Visited With Patient  Visit Type Initial;Spiritual support;Critical Care  Referral From Nurse  Spiritual Encounters  Spiritual Needs Emotional;Prayer  [facility] paged to help comfort patient during intubation; [place] offered prayer at bedside.

## 2018-05-02 NOTE — Procedures (Signed)
Endotracheal Intubation Procedure Note  Indication for endotracheal intubation: respiratory failure. Airway Assessment: Mallampati Class: II (hard and soft palate, upper portion of tonsils anduvula visible). Sedation: etomidate and fentanyl. Paralytic: succinylcholine. Lidocaine: no. Atropine: no. Equipment: Glide scope and 8 Fr ETT. Cricoid Pressure: no. Number of attempts: 1. ETT location confirmed by by auscultation, by CXR and ETCO2 monitor   Patient tolerated the procedure and immediate complications.  Yesenia Carrillo 05/02/2018

## 2018-05-02 NOTE — Progress Notes (Signed)
Patient came to ICU from the floor due to rapid response. HR in 130s and RR in 40s-50s on nonrebreather.ICU MD aware. Patient placed on Bipap. HR still in 130s. RR still in 40s-50s. ICU MD notified. ICU MD instructed to educate patient to slow breathing down. Tried that several times with little success. Antibiotics, metroprolol, lobatolol and fentanyl given, all IV orders. HR in 120s and RR in 40s-50s on bipap. Will continue to monitor.

## 2018-05-02 NOTE — Progress Notes (Signed)
*  PRELIMINARY RESULTS* Echocardiogram 2D Echocardiogram has been performed.  Yesenia Carrillo 05/02/2018, 1:43 PM

## 2018-05-02 NOTE — Progress Notes (Signed)
RN called to the room by techs, pt very short of breath. RN responded with respiratory therapy. Pts RR at 55 with O2 sat at 96% on a non rebreather at 15L. Rapid response called. Pt also noted to have HR in 140s. IV metoprolol pushed. Pt transferred to ICU. RN notified pts mom of transfer.

## 2018-05-02 NOTE — Progress Notes (Signed)
VS were checked per order, patient noted to be extremely hypoxic with O2 sats in the 50's. Patient in no acute distress and resp rate is normal. Wave form was adequate so it was expected that this reading was accurate. Immediately placed on non re breather mask and sats quickly rose to 98%. On call MD paged and has visited the patient at the bedside. CXR orders placed. Lung sounds clear but diminished. Patient is comfortable and there are no further required interventions, will continue to monitor.

## 2018-05-02 NOTE — Progress Notes (Signed)
[place] at [place] NAME: Chrystie Hagwood    MR#:  [id number]  DATE OF BIRTH:  1995/06/19  SUBJECTIVE:  CHIEF COMPLAINT:   Chief Complaint  Patient presents with  . Fever   -Fever of 102 F at midnight.  Still complains of significant dyspnea.  Became hypoxic last night and is started on Ventimask  REVIEW OF SYSTEMS:  Review of Systems  Constitutional: Negative for chills, fever and malaise/fatigue.  HENT: Negative for congestion, ear discharge, hearing loss and nosebleeds.   Eyes: Negative for blurred vision and double vision.  Respiratory: Positive for cough and shortness of breath. Negative for wheezing.   Cardiovascular: Negative for chest pain, palpitations and leg swelling.  Gastrointestinal: Negative for abdominal pain, constipation, diarrhea, nausea and vomiting.  Genitourinary: Negative for dysuria.  Musculoskeletal: Negative for myalgias.  Neurological: Negative for dizziness, focal weakness, seizures, weakness and headaches.  Psychiatric/Behavioral: Negative for depression.    DRUG ALLERGIES:   Allergies  Allergen Reactions  . Ampicillin Rash and Other (See Comments)    Patient and family cannot remember the specifics of the reaction.  Marland Kitchen Penicillins Hives    VITALS:  Blood pressure 118/70, pulse (!) 118, temperature 99.9 F (37.7 C), resp. rate 18, height 5\' 1"  (1.549 m), weight 113.6 kg, last menstrual period 03/30/2018, SpO2 99 %.  PHYSICAL EXAMINATION:  Physical Exam   GENERAL:  22 y.o.-year-old morbidly obese patient lying in the bed, appears dyspneic.  EYES: Pupils equal, round, reactive to light and accommodation. No scleral icterus. Extraocular muscles intact.  HEENT: Head atraumatic, normocephalic. Oropharynx and nasopharynx clear.  NECK:  Supple, no jugular venous distention. No thyroid enlargement, no tenderness.  LUNGS: Normal breath sounds bilaterally, no wheezing, rales,rhonchi or crepitation. No use of  accessory muscles of respiration.  Decreased bibasilar breath sounds CARDIOVASCULAR: S1, S2 normal. No murmurs, rubs, or gallops.  ABDOMEN: Soft, nontender, nondistended. Bowel sounds present. No organomegaly or mass.  EXTREMITIES: No pedal edema, cyanosis, or clubbing.  NEUROLOGIC: Cranial nerves II through XII are intact. Muscle strength 5/5 in all extremities. Sensation intact. Gait not checked.  PSYCHIATRIC: The patient is alert and oriented x 3.  SKIN: No obvious rash, lesion, or ulcer.    LABORATORY PANEL:   CBC Recent Labs  Lab 04/30/18 1649  WBC 15.5*  HGB 11.4*  HCT 35.0*  PLT 408*   ------------------------------------------------------------------------------------------------------------------  Chemistries  Recent Labs  Lab 04/30/18 1649  NA 139  K 4.0  CL 103  CO2 26  GLUCOSE 118*  BUN 9  CREATININE 0.71  CALCIUM 9.1  AST 41  ALT 22  ALKPHOS 61  BILITOT 0.7   ------------------------------------------------------------------------------------------------------------------  Cardiac Enzymes No results for input(s): TROPONINI in the last 168 hours. ------------------------------------------------------------------------------------------------------------------  RADIOLOGY:  Dg Chest 2 View  Result Date: 04/30/2018 CLINICAL DATA:  Cough and fever EXAM: CHEST - 2 VIEW COMPARISON:  09/18/2017 FINDINGS: Patchy right upper lobe opacity. No pleural effusion. Normal heart size. No pneumothorax. IMPRESSION: Patchy right upper lobe opacity may reflect a small pneumonia Electronically Signed   By: Donavan Foil M.D.   On: 04/30/2018 18:40   Ct Angio Chest Pe W Or Wo Contrast  Result Date: 05/02/2018 CLINICAL DATA:  Shortness of breath and fever and chills EXAM: CT ANGIOGRAPHY CHEST WITH CONTRAST TECHNIQUE: Multidetector CT imaging of the chest was performed using the standard protocol during bolus administration of intravenous contrast. Multiplanar CT image  reconstructions and MIPs were obtained to evaluate the  vascular anatomy. CONTRAST:  64mL ISOVUE-370 IOPAMIDOL (ISOVUE-370) INJECTION 76% COMPARISON:  Chest x-ray from earlier in the same day. FINDINGS: Cardiovascular: Thoracic aorta demonstrates a normal branching pattern. No atherosclerotic calcifications are seen. No aneurysmal dilatation or dissection is noted. Cardiac size is at the upper limits of normal. The pulmonary artery shows a normal branching pattern with normal opacification. No findings to suggest pulmonary embolism are seen. Mediastinum/Nodes: Thoracic inlet is within normal limits. Scattered lymph nodes are noted throughout the mediastinum and hila likely reactive in nature given the diffuse pulmonary infiltrates. The largest of these in the right paratracheal region measures 19 mm in short axis. In the AP window a 16 mm short axis lymph node is seen. In the subcarinal region a 19 mm short axis node is noted. The esophagus is within normal limits. Lungs/Pleura: Lungs are well aerated bilaterally but demonstrate diffuse bilateral infiltrates most consolidative within the right upper lobe similar to that seen on prior plain film examination. No sizable effusion is seen. No distinct nodules are seen. Upper Abdomen: Visualized upper abdomen is within normal limits. Musculoskeletal: Bony structures are within normal limits. Review of the MIP images confirms the above findings. IMPRESSION: Diffuse bilateral multifocal pneumonia with associated hilar and mediastinal adenopathy. These changes are similar to that seen on recent plain film examination. No evidence of pulmonary emboli. Electronically Signed   By: Inez Catalina M.D.   On: 05/02/2018 10:50   Dg Chest Port 1 View  Result Date: 05/02/2018 CLINICAL DATA:  Cough. EXAM: PORTABLE CHEST 1 VIEW COMPARISON:  Frontal and lateral views 2 days ago FINDINGS: Right upper lobe opacity has increased from prior exam. Development of additional diffuse  multifocal lung opacities. Prominent heart size may be in part accentuated by technique. No large pleural effusion. No pneumothorax. Portable technique and body habitus limit assessment. IMPRESSION: 1. Right upper lobe opacity has increased from radiographs 2 days ago. Development of diffuse multifocal lung opacities, may reflect atelectasis or additional sites of pneumonia, pulmonary edema is also considered but felt less likely. 2. Prominent heart size may be in part accentuated by technique. Electronically Signed   By: Keith Rake M.D.   On: 05/02/2018 06:36    EKG:   Orders placed or performed during the hospital encounter of 04/30/18  . EKG 12-Lead  . EKG 12-Lead    ASSESSMENT AND PLAN:   22 year old African-American female who is morbidly obese and has history of hypertension admitted to hospital secondary to fevers and chills and dyspnea and is being treated for pneumonia  1.  Acute hypoxic respiratory failure-secondary to multilobar pneumonia -Due to significantly being hypoxic last night and still febrile-ordered a CT angiogram this morning -Change antibiotics from Rocephin and azithromycin to vancomycin and cefepime -Pulmonary consult -Stat ABG ordered.  CT angios negative for PE but has diffuse bilateral pneumonia -High flow oxygen if needed  2.  Sinus tachycardia-secondary to respiratory distress.  Order echocardiogram -On metoprolol orally.  Was started on Cardizem drip if needed  3.  Sepsis-secondary to pneumonia.  Has fevers, elevated white count and elevated lactic acid on admission -Treatment as mentioned above  4.  DVT prophylaxis-Lovenox  Patient had a rapid response called on her for lethargy and increased work of breathing. -Being moved to ICU.  ABG ordered and will start on high flow -Discussed with ICU attending Dr. Soyla Murphy     All the records are reviewed and case discussed with Care Management/Social Workerr. Management plans discussed with the  patient, family  and they are in agreement.  CODE STATUS: Full Code  TOTAL CRITICAL CARE TIME SPENT IN TAKING CARE OF THIS PATIENT: 42 minutes.   POSSIBLE D/C IN ? DAYS, DEPENDING ON CLINICAL CONDITION.   Gladstone Lighter M.D on 05/02/2018 at 11:35 AM  Between 7am to 6pm - Pager - [phone]  After 6pm go to [email or web address] - password [email or web address] [facility]  Office  [phone]  CC: Primary care physician; Center, [place]

## 2018-05-02 NOTE — Consult Note (Addendum)
Name: Yesenia Carrillo MRN: [id number] DOB: 07/26/1995     CONSULTATION DATE: 04/30/2018 22 years old lady with morbid obesityAnd hypertension.  Patient is admitted on April 30, 2018 with right upper lobe pneumonia.  She has been having worsening respiratory status with increased FiO2 requirement up to 100% and progression of pneumonia on a CT scan to multilobar bilateral pneumonia. Patient was transferred to stepdown for possible requirement of positive pressure ventilation.  Antimicrobial was upgraded from CAP coverage to HCAP coverage.  All history was obtained the form primary care physician, the patient and EMR. Patient arrived to the intensive care unit on a nonrebreather in mild respiratory distress and O2 sat 98 to 100% HISTORY OF PRESENT ILLNESS:   PAST MEDICAL HISTORY :   has a past medical history of Hypertension.  has a past surgical history that includes Tonsillectomy and Wisdom tooth extraction. Prior to Admission medications   Medication Sig Start Date End Date Taking? Authorizing Provider  hydrochlorothiazide (HYDRODIURIL) 25 MG tablet Take 1 tablet (25 mg total) by mouth daily. 09/24/15  Yes Lavonia Drafts, MD  albuterol (PROVENTIL HFA;VENTOLIN HFA) 108 (90 Base) MCG/ACT inhaler Inhale 2 puffs into the lungs every 6 (six) hours as needed for wheezing or shortness of breath. 04/24/16   Menshew, Dannielle Karvonen, PA-C  azithromycin (ZITHROMAX Z-PAK) 250 MG tablet 2 pills today then 1 pill a day for 4 days 09/18/17   Caryn Section Linden Dolin, PA-C  benzonatate (TESSALON) 200 MG capsule Take 1 capsule (200 mg total) by mouth 3 (three) times daily as needed for cough. 09/18/17   Fisher, Linden Dolin, PA-C  famotidine (PEPCID) 20 MG tablet Take 1 tablet (20 mg total) by mouth 2 (two) times daily for 15 days. 03/12/18 03/27/18  Schuyler Amor, MD  methylPREDNISolone (MEDROL DOSEPAK) 4 MG TBPK tablet Take 6 pills on day one then decrease by 1 pill each day 09/18/17   Versie Starks, PA-C   Allergies    Allergen Reactions  . Ampicillin Rash and Other (See Comments)    Patient and family cannot remember the specifics of the reaction.  Marland Kitchen Penicillins Hives    FAMILY HISTORY:  family history is not on file. SOCIAL HISTORY:  reports that she has been smoking cigars. She has never used smokeless tobacco. She reports that she drinks alcohol. She reports that she does not use drugs.  REVIEW OF SYSTEMS:   Unable to obtain due to critical illness   VITAL SIGNS: Temp:  [98.6 F (37 C)-102.4 F (39.1 C)] 102.4 F (39.1 C) (11/28 1140) Pulse Rate:  [116-131] 129 (11/28 1140) Resp:  [18-41] 41 (11/28 1140) BP: (118-150)/(60-97) 150/97 (11/28 1140) SpO2:  [58 %-99 %] 90 % (11/28 1140) Weight:  [113.6 kg-122.9 kg] 122.9 kg (11/28 1140)  Physical Examination:  Awake and oriented with no acute focal neurological deficits Tolerating nonrebreather, mild respiratory distress with tachypnea 38 to 41/min, bilateral equal air entry and no adventitious sounds S1 & S2 are audible with no murmur Obese benign abdomen with feeble peristalsis No leg edema  ASSESSMENT / PLAN:  Acute respiratory failure with hypoxemia and mild respiratory distress.  Questionable baseline chronic respiratory failure with obesity hypoventilation syndrome. -BiPAP, monitor ABG and optimize settings.  Consider intubation if no improvement  Multilobar pneumonia.  Bilateral airspace disease -Cefepime plus vancomycin.  Monitor CXR + CBC + FiO2  Hypertension and tachycardia -Optimize antihypertensives and monitor hemodynamics  Mild anemia -Keep hemoglobin more than 7 g/dL  Thrombocytosis more likely  reactive -Monitor platelet count  Full code  DVT & GI prophylaxis.  Continue supportive care Plan of care was discussed with the patient  Critical care time 45 minutes   Patient had worsening respiratory status with respiratory rate up to 62/min and still complaining of shortness of breath.  The mother was updated and  agreed to proceed with intubation and mechanical ventilation. The procedure of endotracheal intubation was explained to the patient and the mother. The patient was successively intubated, blood pressure dropped after starting sedation.  Fluid bolus was ordered and titration of Levophed if systolic blood pressure remains less than. Please see procedure note

## 2018-05-02 NOTE — Progress Notes (Signed)
Pharmacy Antibiotic Note  Yesenia Carrillo is a 22 y.o. female admitted on 04/30/2018 with PNA.  Pharmacy has been consulted for vancomycin and cefepime dosing.  Plan: 1. Cefepime 2 gm IV Q12H 2. Vancomycin 1250 mg IV x 1 followed in approximately 6 hours (stacked dosing) by vancomycin 1.25 gm IV Q8H, predicted trough 15 mcg/ml. Pharmacy will continue to follow and adjust as needed to maintain trough 15 to 20 mcg/ml.   Vd 51.9 L, Ke 0.126 hr-1, T1/2 5.5 hr  Height: 5\' 1"  (154.9 cm) Weight: 250 lb 7.1 oz (113.6 kg) IBW/kg (Calculated) : 47.8  Temp (24hrs), Avg:99.9 F (37.7 C), Min:98.6 F (37 C), Max:102.1 F (38.9 C)  Recent Labs  Lab 04/30/18 1649 04/30/18 2235 05/01/18 0149  WBC 15.5*  --   --   CREATININE 0.71  --   --   LATICACIDVEN  --  2.8* 0.9    Estimated Creatinine Clearance: 130.1 mL/min (by C-G formula based on SCr of 0.71 mg/dL).    Allergies  Allergen Reactions  . Ampicillin Rash and Other (See Comments)    Patient and family cannot remember the specifics of the reaction.  Marland Kitchen Penicillins Hives    Antimicrobials this admission: Azithro/ceftriaxone IV 11/26 to 11/27  Dose adjustments this admission:   Microbiology results: 11/26 BCx: NGTD  UCx:    Sputum:   11/28 MRSA PCR: pending  Thank you for allowing pharmacy to be a part of this patient's care.  Laural Benes, Pharm.D., BCPS Clinical Pharmacist 05/02/2018 9:43 AM

## 2018-05-02 NOTE — Progress Notes (Addendum)
Pt with sustained tachycardia in 130's. Patient asymptomatic. Notified MD who placed orders for bolus, will administer and continue to monitor HR. Patient on telemetry. Patient has been increasingly febrile. Orders in place for PRN tylenol and ibproufen. Patient has responded well to these, lactic acid also noted to be normal.

## 2018-05-02 NOTE — Progress Notes (Signed)
Rn made aware that PICC will be done tomorrow.

## 2018-05-02 NOTE — Plan of Care (Signed)
  Problem: Respiratory: Goal: Ability to maintain adequate ventilation will improve Outcome: Progressing Goal: Ability to maintain a clear airway will improve Outcome: Progressing   Problem: Clinical Measurements: Goal: Ability to maintain a body temperature in the normal range will improve Outcome: Progressing  Patient febrile   Problem: Activity: Goal: Ability to tolerate increased activity will improve Outcome: Progressing Patient suffers from dyspnea on excertion

## 2018-05-03 ENCOUNTER — Inpatient Hospital Stay (HOSPITAL_COMMUNITY)
Admission: AD | Admit: 2018-05-03 | Payer: Self-pay | Source: Other Acute Inpatient Hospital | Admitting: Pulmonary Disease

## 2018-05-03 ENCOUNTER — Inpatient Hospital Stay: Payer: Self-pay

## 2018-05-03 LAB — HEPATIC FUNCTION PANEL
ALT: 20 U/L (ref 0–44)
AST: 36 U/L (ref 15–41)
Albumin: 3 g/dL — ABNORMAL LOW (ref 3.5–5.0)
Alkaline Phosphatase: 59 U/L (ref 38–126)
BILIRUBIN DIRECT: 1.4 mg/dL — AB (ref 0.0–0.2)
BILIRUBIN INDIRECT: 0.8 mg/dL (ref 0.3–0.9)
Total Bilirubin: 2.2 mg/dL — ABNORMAL HIGH (ref 0.3–1.2)
Total Protein: 6.6 g/dL (ref 6.5–8.1)

## 2018-05-03 LAB — CBC WITH DIFFERENTIAL/PLATELET
Abs Immature Granulocytes: 1.49 10*3/uL — ABNORMAL HIGH (ref 0.00–0.07)
Basophils Absolute: 0.1 10*3/uL (ref 0.0–0.1)
Basophils Relative: 0 %
Eosinophils Absolute: 0 10*3/uL (ref 0.0–0.5)
Eosinophils Relative: 0 %
HCT: 27.4 % — ABNORMAL LOW (ref 36.0–46.0)
Hemoglobin: 8.8 g/dL — ABNORMAL LOW (ref 12.0–15.0)
Immature Granulocytes: 3 %
Lymphocytes Relative: 9 %
Lymphs Abs: 4.6 10*3/uL — ABNORMAL HIGH (ref 0.7–4.0)
MCH: 24.4 pg — ABNORMAL LOW (ref 26.0–34.0)
MCHC: 32.1 g/dL (ref 30.0–36.0)
MCV: 75.9 fL — ABNORMAL LOW (ref 80.0–100.0)
Monocytes Absolute: 2.2 10*3/uL — ABNORMAL HIGH (ref 0.1–1.0)
Monocytes Relative: 4 %
NEUTROS ABS: 42.1 10*3/uL — AB (ref 1.7–7.7)
Neutrophils Relative %: 84 %
Platelets: 386 10*3/uL (ref 150–400)
RBC: 3.61 MIL/uL — ABNORMAL LOW (ref 3.87–5.11)
RDW: 17.4 % — ABNORMAL HIGH (ref 11.5–15.5)
SMEAR REVIEW: UNDETERMINED
WBC: 50.4 10*3/uL (ref 4.0–10.5)
nRBC: 0 % (ref 0.0–0.2)

## 2018-05-03 LAB — BLOOD GAS, ARTERIAL
ACID-BASE DEFICIT: 7.1 mmol/L — AB (ref 0.0–2.0)
Acid-base deficit: 5.5 mmol/L — ABNORMAL HIGH (ref 0.0–2.0)
Bicarbonate: 22.3 mmol/L (ref 20.0–28.0)
Bicarbonate: 23.7 mmol/L (ref 20.0–28.0)
FIO2: 1
FIO2: 1
MECHVT: 400 mL
MECHVT: 400 mL
O2 Saturation: 84 %
O2 Saturation: 85.4 %
PEEP: 7 cmH2O
PEEP: 8 cmH2O
PH ART: 7.17 — AB (ref 7.350–7.450)
Patient temperature: 37
Patient temperature: 37
RATE: 24 resp/min
RATE: 28 {breaths}/min
pCO2 arterial: 61 mmHg — ABNORMAL HIGH (ref 32.0–48.0)
pCO2 arterial: 62 mmHg — ABNORMAL HIGH (ref 32.0–48.0)
pH, Arterial: 7.19 — CL (ref 7.350–7.450)
pO2, Arterial: 62 mmHg — ABNORMAL LOW (ref 83.0–108.0)
pO2, Arterial: 63 mmHg — ABNORMAL LOW (ref 83.0–108.0)

## 2018-05-03 LAB — BASIC METABOLIC PANEL
Anion gap: 7 (ref 5–15)
Anion gap: 7 (ref 5–15)
BUN: 12 mg/dL (ref 6–20)
BUN: 13 mg/dL (ref 6–20)
CALCIUM: 7.6 mg/dL — AB (ref 8.9–10.3)
CO2: 25 mmol/L (ref 22–32)
CO2: 25 mmol/L (ref 22–32)
CREATININE: 1.34 mg/dL — AB (ref 0.44–1.00)
Calcium: 7.6 mg/dL — ABNORMAL LOW (ref 8.9–10.3)
Chloride: 106 mmol/L (ref 98–111)
Chloride: 106 mmol/L (ref 98–111)
Creatinine, Ser: 1.53 mg/dL — ABNORMAL HIGH (ref 0.44–1.00)
GFR calc Af Amer: 56 mL/min — ABNORMAL LOW (ref >60)
GFR calc Af Amer: >60 mL/min (ref >60)
GFR calc non Af Amer: 48 mL/min — ABNORMAL LOW (ref >60)
GFR, EST NON AFRICAN AMERICAN: 56 mL/min — AB (ref >60)
Glucose, Bld: 115 mg/dL — ABNORMAL HIGH (ref 70–99)
Glucose, Bld: 133 mg/dL — ABNORMAL HIGH (ref 70–99)
Potassium: 4.1 mmol/L (ref 3.5–5.1)
Potassium: 4.7 mmol/L (ref 3.5–5.1)
Sodium: 138 mmol/L (ref 135–145)
Sodium: 138 mmol/L (ref 135–145)

## 2018-05-03 LAB — PHOSPHORUS
Phosphorus: 3.8 mg/dL (ref 2.5–4.6)
Phosphorus: 5.1 mg/dL — ABNORMAL HIGH (ref 2.5–4.6)

## 2018-05-03 LAB — FIBRIN DERIVATIVES D-DIMER (ARMC ONLY): Fibrin derivatives D-dimer (ARMC): >7500 ng/mL (FEU) — ABNORMAL HIGH (ref 0.00–499.00)

## 2018-05-03 LAB — MAGNESIUM
Magnesium: 1.7 mg/dL (ref 1.7–2.4)
Magnesium: 1.8 mg/dL (ref 1.7–2.4)

## 2018-05-03 LAB — PROCALCITONIN: Procalcitonin: 9.13 ng/mL

## 2018-05-03 LAB — TROPONIN I
Troponin I: 0.08 ng/mL (ref <0.03)
Troponin I: 0.15 ng/mL (ref <0.03)

## 2018-05-03 LAB — ECHOCARDIOGRAM COMPLETE
Height: 61 in
Weight: 4335.13 oz

## 2018-05-03 MED ORDER — HEPARIN SODIUM (PORCINE) 5000 UNIT/ML IJ SOLN: 5000.0000 [IU] | Freq: Three times a day (TID) | INTRAMUSCULAR | Status: DC

## 2018-05-03 MED ORDER — HYDROCORTISONE NA SUCCINATE PF 100 MG IJ SOLR: 50.0000 mg | Freq: Four times a day (QID) | INTRAMUSCULAR | 0 refills | Status: DC

## 2018-05-03 MED ORDER — SODIUM CHLORIDE 0.9 % IV SOLN: 3.0000 ug/kg/min | INTRAVENOUS | 0 refills | Status: DC

## 2018-05-03 MED ORDER — VASOPRESSIN 20 UNIT/ML IV SOLN: 0.0300 [IU]/min | INTRAVENOUS | 0 refills | Status: DC

## 2018-05-03 MED ORDER — SODIUM CHLORIDE 0.9 % IV SOLN: 2.0000 g | Freq: Two times a day (BID) | INTRAVENOUS | 0 refills | Status: DC

## 2018-05-03 MED ORDER — SODIUM CHLORIDE 0.9 % IV BOLUS
1000.0000 mL | Freq: Once | INTRAVENOUS | Status: AC
  Administered 2018-05-03: 1000 mL via INTRAVENOUS

## 2018-05-03 MED ORDER — IPRATROPIUM-ALBUTEROL 0.5-2.5 (3) MG/3ML IN SOLN: 3.0000 mL | RESPIRATORY_TRACT | 0 refills | Status: DC | PRN

## 2018-05-03 MED ORDER — FAMOTIDINE IN NACL 20-0.9 MG/50ML-% IV SOLN: 20.0000 mg | Freq: Two times a day (BID) | INTRAVENOUS | 0 refills | Status: DC

## 2018-05-03 MED ORDER — SODIUM CHLORIDE 0.9 % IV BOLUS
500.0000 mL | Freq: Once | INTRAVENOUS | Status: AC
  Administered 2018-05-03: 500 mL via INTRAVENOUS

## 2018-05-03 MED ORDER — PROPOFOL 1000 MG/100ML IV EMUL: 25.0000 ug/kg/min | INTRAVENOUS | 0 refills | Status: DC

## 2018-05-03 MED ORDER — MIDAZOLAM HCL 2 MG/2ML IJ SOLN: 2.0000 mg | INTRAMUSCULAR | 0 refills | Status: DC | PRN

## 2018-05-03 MED ORDER — HYDROCORTISONE NA SUCCINATE PF 100 MG IJ SOLR
50.0000 mg | Freq: Four times a day (QID) | INTRAMUSCULAR | Status: DC
  Administered 2018-05-03: 50 mg via INTRAVENOUS
  Filled 2018-05-03: qty 2

## 2018-05-03 MED ORDER — ARTIFICIAL TEARS OPHTHALMIC OINT
TOPICAL_OINTMENT | Freq: Three times a day (TID) | OPHTHALMIC | Status: DC
  Filled 2018-05-03: qty 3.5

## 2018-05-03 MED ORDER — FENTANYL 2500MCG IN NS 250ML (10MCG/ML) PREMIX INFUSION
100.0000 ug/h | INTRAVENOUS | Status: DC
  Administered 2018-05-03: 150 ug/h via INTRAVENOUS
  Administered 2018-05-03: 100 ug/h via INTRAVENOUS
  Filled 2018-05-03: qty 250

## 2018-05-03 MED ORDER — ARTIFICIAL TEARS OPHTHALMIC OINT: 1.0000 "application " | TOPICAL_OINTMENT | Freq: Three times a day (TID) | OPHTHALMIC | Status: DC

## 2018-05-03 MED ORDER — FENTANYL CITRATE (PF) 100 MCG/2ML IJ SOLN: 100.0000 ug | Freq: Once | INTRAMUSCULAR | Status: DC | PRN

## 2018-05-03 MED ORDER — FAMOTIDINE IN NACL 20-0.9 MG/50ML-% IV SOLN
20.0000 mg | Freq: Two times a day (BID) | INTRAVENOUS | Status: DC
  Administered 2018-05-03: 20 mg via INTRAVENOUS
  Filled 2018-05-03: qty 50

## 2018-05-03 MED ORDER — NOREPINEPHRINE 16 MG/250ML-% IV SOLN: 0.0000 ug/min | INTRAVENOUS | 0 refills | Status: DC

## 2018-05-03 MED ORDER — FENTANYL CITRATE (PF) 100 MCG/2ML IJ SOLN
100.0000 ug | Freq: Once | INTRAMUSCULAR | Status: AC
  Administered 2018-05-03: 100 ug via INTRAVENOUS
  Filled 2018-05-03: qty 2

## 2018-05-03 MED ORDER — FAMOTIDINE IN NACL 20-0.9 MG/50ML-% IV SOLN: 20.0000 mg | INTRAVENOUS | Status: DC

## 2018-05-03 MED ORDER — FENTANYL BOLUS VIA INFUSION
50.0000 ug | INTRAVENOUS | Status: DC | PRN
  Filled 2018-05-03: qty 50

## 2018-05-03 MED ORDER — HEPARIN SODIUM (PORCINE) 5000 UNIT/ML IJ SOLN: 5000.0000 [IU] | Freq: Three times a day (TID) | INTRAMUSCULAR | 0 refills | Status: DC

## 2018-05-03 MED ORDER — SODIUM BICARBONATE 8.4 % IV SOLN: 25.0000 meq | Freq: Once | INTRAVENOUS | Status: DC

## 2018-05-03 MED ORDER — HYDROCORTISONE NA SUCCINATE PF 100 MG IJ SOLR
50.0000 mg | Freq: Four times a day (QID) | INTRAMUSCULAR | Status: DC
  Administered 2018-05-03 (×2): 50 mg via INTRAVENOUS
  Filled 2018-05-03 (×2): qty 2

## 2018-05-03 MED ORDER — SODIUM CHLORIDE 0.9 % IV SOLN
3.0000 ug/kg/min | INTRAVENOUS | Status: DC
  Administered 2018-05-03: 3 ug/kg/min via INTRAVENOUS
  Administered 2018-05-03: 4 ug/kg/min via INTRAVENOUS
  Filled 2018-05-03 (×2): qty 20

## 2018-05-03 MED ORDER — FUROSEMIDE 10 MG/ML IJ SOLN
20.0000 mg | Freq: Once | INTRAMUSCULAR | Status: AC
  Administered 2018-05-03: 20 mg via INTRAVENOUS
  Filled 2018-05-03: qty 2

## 2018-05-03 MED ORDER — PROPOFOL 1000 MG/100ML IV EMUL
25.0000 ug/kg/min | INTRAVENOUS | Status: DC
  Administered 2018-05-03 (×2): 25 ug/kg/min via INTRAVENOUS
  Filled 2018-05-03: qty 100

## 2018-05-03 MED ORDER — ACETAMINOPHEN 650 MG RE SUPP: 650.0000 mg | Freq: Four times a day (QID) | RECTAL | 0 refills | Status: DC | PRN

## 2018-05-03 MED ORDER — CISATRACURIUM BOLUS VIA INFUSION
0.0500 mg/kg | Freq: Once | INTRAVENOUS | Status: AC
  Administered 2018-05-03: 6.1 mg via INTRAVENOUS
  Filled 2018-05-03: qty 7

## 2018-05-03 MED ORDER — SODIUM BICARBONATE 8.4 % IV SOLN
25.0000 meq | Freq: Once | INTRAVENOUS | Status: AC
  Administered 2018-05-03: 25 meq via INTRAVENOUS
  Filled 2018-05-03: qty 50

## 2018-05-03 MED ORDER — VASOPRESSIN 20 UNIT/ML IV SOLN
0.0300 [IU]/min | INTRAVENOUS | Status: DC
  Administered 2018-05-03: 0.03 [IU]/min via INTRAVENOUS
  Filled 2018-05-03: qty 2

## 2018-05-03 MED ORDER — VANCOMYCIN HCL 10 G IV SOLR: 1250.0000 mg | Freq: Three times a day (TID) | INTRAVENOUS | 0 refills | Status: DC

## 2018-05-03 MED ORDER — FENTANYL 2500MCG IN NS 250ML (10MCG/ML) PREMIX INFUSION: 100.0000 ug/h | INTRAVENOUS | 0 refills | Status: DC

## 2018-05-03 MED ORDER — EPINEPHRINE PF 1 MG/ML IJ SOLN
0.5000 ug/min | INTRAVENOUS | Status: DC
  Filled 2018-05-03 (×2): qty 4

## 2018-05-03 NOTE — Progress Notes (Signed)
Spoke with RN Leroy Sea, and was made aware that the patient no longer needs a PICC Line she has a [employer] and she is going to be transported to [facility] at this time.

## 2018-05-03 NOTE — Progress Notes (Signed)
   05/03/18 1200  Clinical Encounter Type  Visited With Patient and family together  Visit Type Initial;Spiritual support;Critical Care  Referral From [place] responded to page to meet with the patient and family, prior to the patient's transport to [facility]. Chaplain provided prayer, emotional support, and pastoral presence for the family and energetic prayers for the patient. Further, Chaplain continued to provide active listening and emotional support for the [facility] and ICU care team until the patient's departure.

## 2018-05-03 NOTE — Progress Notes (Signed)
[facility] [facility] [place] NAME: Yesenia Carrillo    MR#:  [id number]  DATE OF BIRTH:  20-Aug-1995  SUBJECTIVE:  CHIEF COMPLAINT:   Chief Complaint  Patient presents with  . Fever   -Patient moved to ICU yesterday with worsening respiratory distress.  Currently intubated and on sedation and 2 pressors.  Requiring 100% FiO2 and PEEP of 8  REVIEW OF SYSTEMS:  Review of Systems  Unable to perform ROS: Critical illness    DRUG ALLERGIES:   Allergies  Allergen Reactions  . Ampicillin Rash and Other (See Comments)    Patient and family cannot remember the specifics of the reaction.  Marland Kitchen Penicillins Hives    VITALS:  Blood pressure 100/68, pulse (!) 129, temperature 98.7 F (37.1 C), temperature source Oral, resp. rate (!) 28, height 5\' 1"  (1.549 m), weight 124.9 kg, last menstrual period 04/30/2018, SpO2 97 %.  PHYSICAL EXAMINATION:  Physical Exam   GENERAL:  22 y.o.-year-old morbidly obese patient lying in the bed, Intubated and sedated EYES: Pupils equal, round, reactive to light and accommodation. No scleral icterus. Extraocular muscles intact.  HEENT: Head atraumatic, normocephalic. Oropharynx and nasopharynx clear.  NECK:  Supple, no jugular venous distention. No thyroid enlargement, no tenderness.  LUNGS: Normal breath sounds bilaterally, no wheezing, rales,rhonchi or crepitation. No use of accessory muscles of respiration.  Decreased bibasilar breath sounds CARDIOVASCULAR: S1, S2 normal. No murmurs, rubs, or gallops.  ABDOMEN: Soft, nontender, nondistended. Bowel sounds present. No organomegaly or mass.  EXTREMITIES: No pedal edema, cyanosis, or clubbing.  NEUROLOGIC:currently sedated and not following commands  PSYCHIATRIC: The patient is sedated.  SKIN: No obvious rash, lesion, or ulcer.    LABORATORY PANEL:   CBC Recent Labs  Lab 05/03/18 0144  WBC 50.4*  HGB 8.8*  HCT 27.4*  PLT 386    ------------------------------------------------------------------------------------------------------------------  Chemistries  Recent Labs  Lab 05/03/18 0447  NA 138  K 4.7  CL 106  CO2 25  GLUCOSE 133*  BUN 13  CREATININE 1.34*  CALCIUM 7.6*  MG 1.8  AST 36  ALT 20  ALKPHOS 59  BILITOT 2.2*   ------------------------------------------------------------------------------------------------------------------  Cardiac Enzymes Recent Labs  Lab 05/03/18 0837  TROPONINI 0.15*   ------------------------------------------------------------------------------------------------------------------  RADIOLOGY:  Ct Angio Chest Pe W Or Wo Contrast  Result Date: 05/02/2018 CLINICAL DATA:  Shortness of breath and fever and chills EXAM: CT ANGIOGRAPHY CHEST WITH CONTRAST TECHNIQUE: Multidetector CT imaging of the chest was performed using the standard protocol during bolus administration of intravenous contrast. Multiplanar CT image reconstructions and MIPs were obtained to evaluate the vascular anatomy. CONTRAST:  68mL ISOVUE-370 IOPAMIDOL (ISOVUE-370) INJECTION 76% COMPARISON:  Chest x-ray from earlier in the same day. FINDINGS: Cardiovascular: Thoracic aorta demonstrates a normal branching pattern. No atherosclerotic calcifications are seen. No aneurysmal dilatation or dissection is noted. Cardiac size is at the upper limits of normal. The pulmonary artery shows a normal branching pattern with normal opacification. No findings to suggest pulmonary embolism are seen. Mediastinum/Nodes: Thoracic inlet is within normal limits. Scattered lymph nodes are noted throughout the mediastinum and hila likely reactive in nature given the diffuse pulmonary infiltrates. The largest of these in the right paratracheal region measures 19 mm in short axis. In the AP window a 16 mm short axis lymph node is seen. In the subcarinal region a 19 mm short axis node is noted. The esophagus is within normal limits.  Lungs/Pleura: Lungs are well aerated bilaterally but demonstrate diffuse  bilateral infiltrates most consolidative within the right upper lobe similar to that seen on prior plain film examination. No sizable effusion is seen. No distinct nodules are seen. Upper Abdomen: Visualized upper abdomen is within normal limits. Musculoskeletal: Bony structures are within normal limits. Review of the MIP images confirms the above findings. IMPRESSION: Diffuse bilateral multifocal pneumonia with associated hilar and mediastinal adenopathy. These changes are similar to that seen on recent plain film examination. No evidence of pulmonary emboli. Electronically Signed   By: Inez Catalina M.D.   On: 05/02/2018 10:50   US Venous Img Lower Bilateral  Result Date: 05/03/2018 CLINICAL DATA:  Bilateral lower extremity edema. EXAM: BILATERAL LOWER EXTREMITY VENOUS DOPPLER ULTRASOUND TECHNIQUE: Gray-scale sonography with graded compression, as well as color Doppler and duplex ultrasound were performed to evaluate the lower extremity deep venous systems from the level of the common femoral vein and including the common femoral, femoral, profunda femoral, popliteal and calf veins including the posterior tibial, peroneal and gastrocnemius veins when visible. The superficial great saphenous vein was also interrogated. Spectral Doppler was utilized to evaluate flow at rest and with distal augmentation maneuvers in the common femoral, femoral and popliteal veins. COMPARISON:  None. FINDINGS: RIGHT LOWER EXTREMITY Common Femoral Vein: No evidence of thrombus. Normal compressibility, respiratory phasicity and response to augmentation. Saphenofemoral Junction: No evidence of thrombus. Normal compressibility and flow on color Doppler imaging. Profunda Femoral Vein: No evidence of thrombus. Normal compressibility and flow on color Doppler imaging. Femoral Vein: No evidence of thrombus. Normal compressibility, respiratory phasicity and response  to augmentation. Popliteal Vein: No evidence of thrombus. Normal compressibility, respiratory phasicity and response to augmentation. Calf Veins: No evidence of thrombus. Normal compressibility and flow on color Doppler imaging. Superficial Great Saphenous Vein: No evidence of thrombus. Normal compressibility. Venous Reflux:  None. Other Findings:  None. LEFT LOWER EXTREMITY Common Femoral Vein: No evidence of thrombus. Normal compressibility, respiratory phasicity and response to augmentation. Saphenofemoral Junction: No evidence of thrombus. Normal compressibility and flow on color Doppler imaging. Profunda Femoral Vein: No evidence of thrombus. Normal compressibility and flow on color Doppler imaging. Femoral Vein: No evidence of thrombus. Normal compressibility, respiratory phasicity and response to augmentation. Popliteal Vein: No evidence of thrombus. Normal compressibility, respiratory phasicity and response to augmentation. Calf Veins: No evidence of thrombus. Normal compressibility and flow on color Doppler imaging. Superficial Great Saphenous Vein: No evidence of thrombus. Normal compressibility. Venous Reflux:  None. Other Findings:  None. IMPRESSION: No evidence of bilateral lower extremity deep venous thrombosis. Electronically Signed   By: Keith Rake M.D.   On: 05/03/2018 03:36   Dg Chest Port 1 View  Result Date: 05/03/2018 CLINICAL DATA:  Initial evaluation for hypoxia. EXAM: PORTABLE CHEST 1 VIEW COMPARISON:  Prior radiograph from 05/02/2018 FINDINGS: Endotracheal tube remains in place with tip positioned approximately 2.8 cm above the carina. Enteric tube courses into the abdomen. Cardiac and mediastinal silhouettes grossly stable. Lungs mildly hypoinflated. Extensive diffuse bilateral pulmonary airspace disease, stable to perhaps slightly worsened from previous. Suspected small bilateral pleural effusions. No pneumothorax. No acute osseous abnormality. IMPRESSION: 1. Tip of the  endotracheal tube appropriately positioned above the carina. 2. Persistent extensive diffuse bilateral pulmonary airspace disease, stable to perhaps slightly worsened from previous. 3. Suspected small bilateral pleural effusions. Electronically Signed   By: Jeannine Boga M.D.   On: 05/03/2018 01:22   Dg Chest Port 1 View  Result Date: 05/02/2018 CLINICAL DATA:  Post intubation. EXAM: PORTABLE CHEST 1 VIEW COMPARISON:  Chest CT May 02, 2018. Chest x-ray dated May 02, 2018. FINDINGS: The new ET tube terminates 2.7 cm above the carina. An enteric tube terminates to the left of the ETT, in the upper chest. Diffuse bilateral pulmonary infiltrates remain. The cardiomediastinal silhouette is stable. No pneumothorax. No other changes. IMPRESSION: 1. An enteric tube terminates in the upper chest and positioning is unclear, either the proximal esophagus or less likely the trachea. On a KUB with the same time stamp, the enteric tube terminates in the stomach. If the KUB was performed after the chest x-ray, the tube may have been advanced into appropriate position between the 2 fill. Recommend clinical correlation. 2. The ETT is in good position. 3. Diffuse bilateral pulmonary infiltrates, worsened since the May 02, 2018 chest x-ray. These results will be called to the ordering clinician or representative by the Radiologist Assistant, and communication documented in the PACS or zVision Dashboard. Electronically Signed   By: Dorise Bullion III M.D   On: 05/02/2018 17:20   Dg Chest Port 1 View  Result Date: 05/02/2018 CLINICAL DATA:  Cough. EXAM: PORTABLE CHEST 1 VIEW COMPARISON:  Frontal and lateral views 2 days ago FINDINGS: Right upper lobe opacity has increased from prior exam. Development of additional diffuse multifocal lung opacities. Prominent heart size may be in part accentuated by technique. No large pleural effusion. No pneumothorax. Portable technique and body habitus limit  assessment. IMPRESSION: 1. Right upper lobe opacity has increased from radiographs 2 days ago. Development of diffuse multifocal lung opacities, may reflect atelectasis or additional sites of pneumonia, pulmonary edema is also considered but felt less likely. 2. Prominent heart size may be in part accentuated by technique. Electronically Signed   By: Keith Rake M.D.   On: 05/02/2018 06:36   Dg Abd Portable 1v  Result Date: 05/02/2018 CLINICAL DATA:  Evaluate OG tube placement EXAM: PORTABLE ABDOMEN - 1 VIEW COMPARISON:  None. FINDINGS: The OG tube terminates in the left upper quadrant, within the stomach. IMPRESSION: Appropriate OG tube placement on this study. Electronically Signed   By: Dorise Bullion III M.D   On: 05/02/2018 17:23   [place] Ekg Site Rite  Result Date: 05/02/2018 If Site Rite image not attached, placement could not be confirmed due to current cardiac rhythm.   EKG:   Orders placed or performed during the hospital encounter of 04/30/18  . EKG 12-Lead  . EKG 12-Lead  . EKG 12-Lead  . EKG 12-Lead    ASSESSMENT AND PLAN:   22 year old African-American female who is morbidly obese and has history of hypertension admitted to hospital secondary to fevers and chills and dyspnea and is being treated for pneumonia  1.  Acute hypoxic respiratory failure-secondary to multilobar pneumonia - now with ARDS- worsening pulmonary function - on 100% fio2, PEEP 8- intubated now -CT angiogram -negative for PE but has bilateral diffuse pneumonia. -On broad-spectrum antibiotics with vancomycin and cefepime. -Appreciate intensivist consult.  He recommends transferring to tertiary care center for possible nonconventional methods of ventilation, prone positioning  2.  Sinus tachycardia-secondary to respiratory distress.  F/u echocardiogram -On metoprolol  3.  Sepsis-secondary to pneumonia.  Has fevers, elevated white count and elevated lactic acid on admission -Treatment as mentioned  above -Also on stress dose steroids -On levophed and vasopressin  4.  DVT prophylaxis-Lovenox  Grave prognosis    All the records are reviewed and case discussed with Care Management/Social Workerr. Management plans discussed with the patient, family and they are in agreement.  CODE STATUS: Full Code  TOTAL  TIME SPENT IN TAKING CARE OF THIS PATIENT: 36 minutes.   POSSIBLE D/C IN ? DAYS, DEPENDING ON CLINICAL CONDITION.   Gladstone Lighter M.D on 05/03/2018 at 9:41 AM  Between 7am to 6pm - Pager - [phone]  After 6pm go to [email or web address] - password [email or web address] [facility]  Office  [phone]  CC: Primary care physician; [facility], [place]

## 2018-05-03 NOTE — Progress Notes (Signed)
   05/03/18 1230  Clinical Encounter Type  Visited With Family  Visit Type Follow-up;Spiritual support  Spiritual Encounters  Spiritual Needs Emotional;Prayer   While rounding on unit, chaplain found patient's aunt Yesenia Carrillo crying in the hallway.  Chaplain stayed with Yesenia Carrillo, offering emotional support and silent and energetic prayers.  Yesenia Carrillo spoke of her mother who is a patient in this hospital; chaplain offered to visit Yesenia Carrillo's mom to which she said to tell her mom that she sent the chaplain.

## 2018-05-03 NOTE — Progress Notes (Signed)
Pharmacy Antibiotic Note  Yesenia Carrillo is a 22 y.o. female admitted on 04/30/2018 with pnumonia. Patient with morbid obesity. Patient transferred to ICU on 11/28 with worsening status. Patient subsequently had antibiotic coverage broadened. Plan is to transfer patient to [facility] as patient has ARDS and would likely beneft from ECMO. Pharmacy has been consulted for vancomycin and cefepime dosing.  Plan: Continue cefepime 2g IV Q12hr.   Will continue vancomycin 1250mg  IV Q8hr for goal trough of 15-20. Patient's 10am dose of vancomycin administered ~ 1115. Patient with negative MRSA PCR; narrowing of antibiotics to be determined after transfer.   Height: 5\' 1"  (154.9 cm) Weight: 275 lb 5.7 oz (124.9 kg) IBW/kg (Calculated) : 47.8  Temp (24hrs), Avg:100.6 F (38.1 C), Min:97.9 F (36.6 C), Max:103 F (39.4 C)  Recent Labs  Lab 04/30/18 1649 04/30/18 2235 05/01/18 0149 05/02/18 2050 05/03/18 0144 05/03/18 0447  WBC 15.5*  --   --  37.3* 50.4*  --   CREATININE 0.71  --   --  1.19* 1.53* 1.34*  LATICACIDVEN  --  2.8* 0.9  --   --   --     Estimated Creatinine Clearance: 82.4 mL/min (A) (by C-G formula based on SCr of 1.34 mg/dL (H)).    Allergies  Allergen Reactions  . Ampicillin Rash and Other (See Comments)    Patient and family cannot remember the specifics of the reaction.  Marland Kitchen Penicillins Hives    Antimicrobials this admission: Azithromycin 11/26 >> 11/27 Ceftriaxone 11/26 >> 11/27  Cefepime 11/28 >> Vancomycin 11/28 >>  Dose adjustments this admission: N/A  Microbiology results: 11/26 BCx: no growth x 3 days  11/28 Tracheal Aspirate: normal flora  11/28 MRSA PCR: pending  Thank you for allowing pharmacy to be a part of this patient's care.  Kasidy Gianino L,  05/03/2018 10:55 AM

## 2018-05-03 NOTE — Progress Notes (Addendum)
Patient is currently on vent, sedated, on Nimbex, two pressers FIO2 100% PEEP of 8 with PIP 37. With worsening ARDS and P/F. Possible requirement for prone positioning, non conventional mode of ventilation patient's mother was update with the patient progress and considering transfer for higher level of care Case was discussed with Dr Jimmy Footman (Intensivist on call at [facility]) and she didn't accept the transfer and requested to reach out to day time team. A second call to [facility], I spoke with day time Intensivist who kindly accepted the transfer however no bed was available The case was discussed with [facility] on call Intensivist Dr. Tobie Poet, and the patient was accepted. Family were notified at the bedside and they agreed to transfer and they were made aware of Benefit / risk of transfer. Repeat ABG showed mild improvement of oxygenation (P/F 80 on 100%) worsening respiratory acidosis on 400 vt x 28/min and PIP 44.

## 2018-05-03 NOTE — Progress Notes (Signed)
Pt had a troponin level of 0.08 and a WBC count of 50.4.  Darlyn Chamber, NP is aware.  EKG was done which showed no changes.

## 2018-05-03 NOTE — Discharge Summary (Signed)
Name: Yesenia Carrillo MRN: [id number] DOB: [birth year]     CONSULTATION DATE: 04/30/2018    HISTORY OF PRESENT ILLNESS:   22 years old lady with morbid obesityAnd hypertension.  Patient is admitted on April 30, 2018 with right upper lobe pneumonia.  She has been having worsening respiratory status with increased FiO2 requirement up to 100% and progression of pneumonia on a CT scan to multilobar bilateral pneumonia. Patient was transferred to stepdown for possible requirement of positive pressure ventilation.  Antimicrobial was upgraded from CAP coverage to HCAP coverage. She developed ARDS with p/f < 100, was emergently intubated, had to be paralyzed and started on low vT vent settings. She developed AKI with ATN, renal hypoperfusion, septic shock and had to be started on pressers PAST MEDICAL HISTORY :   has a past medical history of Hypertension.  has a past surgical history that includes Tonsillectomy and Wisdom tooth extraction. Prior to Admission medications   Medication Sig Start Date End Date Taking? Authorizing Provider  hydrochlorothiazide (HYDRODIURIL) 25 MG tablet Take 1 tablet (25 mg total) by mouth daily. 09/24/15  Yes Lavonia Drafts, MD  acetaminophen (TYLENOL) 650 MG suppository Place 1 suppository (650 mg total) rectally every 6 (six) hours as needed for mild pain (or Fever >/= 101). 05/03/18   Cassandria Santee, MD  albuterol (PROVENTIL HFA;VENTOLIN HFA) 108 (90 Base) MCG/ACT inhaler Inhale 2 puffs into the lungs every 6 (six) hours as needed for wheezing or shortness of breath. 04/24/16   Menshew, Dannielle Karvonen, PA-C  azithromycin (ZITHROMAX Z-PAK) 250 MG tablet 2 pills today then 1 pill a day for 4 days 09/18/17   Caryn Section Linden Dolin, PA-C  benzonatate (TESSALON) 200 MG capsule Take 1 capsule (200 mg total) by mouth 3 (three) times daily as needed for cough. 09/18/17   Fisher, Linden Dolin, PA-C  ceFEPIme 2 g in sodium chloride 0.9 % 100 mL Inject 2 g into the vein every 12 (twelve) hours.  05/03/18   Cassandria Santee, MD  cisatracurium 200 mg in sodium chloride 0.9 % 180 mL Inject 368.7-1,229 mcg/min into the vein continuous. 05/03/18   Cassandria Santee, MD  famotidine (PEPCID) 20 MG tablet Take 1 tablet (20 mg total) by mouth 2 (two) times daily for 15 days. 03/12/18 03/27/18  Schuyler Amor, MD  famotidine (PEPCID) 20-0.9 MG/50ML-% Inject 50 mLs (20 mg total) into the vein every 12 (twelve) hours. 05/03/18   Cassandria Santee, MD  fentaNYL 10 mcg/ml SOLN infusion Inject 100-300 mcg/hr into the vein continuous. 05/03/18   Cassandria Santee, MD  heparin 5000 UNIT/ML injection Inject 1 mL (5,000 Units total) into the skin every 8 (eight) hours. 05/03/18   Cassandria Santee, MD  hydrocortisone sodium succinate (SOLU-CORTEF) 100 MG SOLR injection Inject 1 mL (50 mg total) into the vein every 6 (six) hours. 05/03/18   Wynonna Fitzhenry, MD  ipratropium-albuterol (DUONEB) 0.5-2.5 (3) MG/3ML SOLN Take 3 mLs by nebulization every 4 (four) hours as needed. 05/03/18   Cassandria Santee, MD  methylPREDNISolone (MEDROL DOSEPAK) 4 MG TBPK tablet Take 6 pills on day one then decrease by 1 pill each day 09/18/17   Versie Starks, PA-C  midazolam (VERSED) 2 MG/2ML SOLN injection Inject 2 mLs (2 mg total) into the vein every 15 (fifteen) minutes as needed (to acheive RASS goal.). 05/03/18   Cassandria Santee, MD  norepinephrine (LEVOPHED) 16-5 MG/250ML-% SOLN Inject 0-40 mcg/min into the vein continuous. 05/03/18   Cassandria Santee, MD  propofol (DIPRIVAN) 1000 MG/100ML EMUL  injection Inject 3,072.5-9,832 mcg/min into the vein continuous. 05/03/18   Cassandria Santee, MD  vancomycin 1,250 mg in sodium chloride 0.9 % 250 mL Inject 1,250 mg into the vein every 8 (eight) hours. 05/03/18   Cassandria Santee, MD  vasopressin 40 Units in sodium chloride 0.9 % 250 mL Inject 0.03 Units/min into the vein continuous. 05/03/18   Cassandria Santee, MD   Allergies  Allergen Reactions  . Ampicillin Rash and Other (See Comments)    Patient and family cannot  remember the specifics of the reaction.  Marland Kitchen Penicillins Hives    FAMILY HISTORY:  family history is not on file. SOCIAL HISTORY:  reports that she has been smoking cigars. She has never used smokeless tobacco. She reports that she drinks alcohol. She reports that she does not use drugs.  REVIEW OF SYSTEMS:   Unable to obtain due to critical illness   VITAL SIGNS: Temp:  [97.9 F (36.6 C)-103 F (39.4 C)] 98.7 F (37.1 C) (11/29 0830) Pulse Rate:  [110-133] 129 (11/29 0300) Resp:  [0-52] 0 (11/29 1100) BP: (80-154)/(44-97) 105/71 (11/29 1100) SpO2:  [90 %-100 %] 97 % (11/29 1048) FiO2 (%):  [100 %] 100 % (11/29 1048) Weight:  [124.9 kg] 124.9 kg (11/29 0500)  Physical Examination:  Sedated and medically paralyzed On vent, no distress, bilateral equal air entry and no adventitious sounds S1 & S2 are audible with no murmur Obese benign abdomen with feeble peristalsis No leg edema  ASSESSMENT / PLAN:  Acute respiratory failure with hypoxemia, hypercapnia and ARDS.  Questionable baseline chronic respiratory failure with obesity hypoventilation syndrome. -Low vT vent settings, permissive hypercapnea -Plan to transfer to [facility] for ECMO evaluation. Accepted by Dr. Tobie Poet.  Multilobar pneumonia.  worsening Bilateral airspace disease -Cefepime plus vancomycin.  Monitor CXR + CBC + FiO2  Septic shock and  Hypotension with meds -Optimize vasopressers and monitor hemodynamics  AKI with ATN, renal hypoperfusion -Optimize hydration, avoid nephrotoxins, monitor renal panel and urine out put.  Anemia -Keep hemoglobin more than 7 g/dL  Thrombocytosis more likely reactive -Monitor platelet count  Full code  DVT & GI prophylaxis.  Continue supportive care Plan of care was discussed with the patient family.  Critical care time 45 minutes

## 2018-05-03 NOTE — Progress Notes (Addendum)
[facility] (DLF) team arrived to this patient's bedside at 1110 hours. Verbal report was given to flight crew, Annamaria Helling, RN & Ronalee Belts, EMT-P. While giving report, the patient became tachycardic (160's), hypotensive (BP would not read), no radial pulses, diaphoretic. DLF took over care of patient's pressors. The team made multiple phone calls to Dr. Tobie Poet, accepting intensivist at [facility], to update him on the patient and to get further orders. SpO2 dropped into the upper 70's. DLF switched the patient to their ventilator and made adjustments in the settings. The patient was given boluses of pressors, an Epi gtt was started, ketamine and vecuronium by DLF. A 1L bolus of 0.9% NS was administered with a pressure bag. Discussion was held between crew and Dr. Tobie Poet about having the patient canulated here at [facility]. Decision was made to get the patient to [facility] ASAP. The patient was moved to the DLF stretcher and secured to same. Per their request, this RN escorted them to the helicopter in case the patient decompensated again. Patient was left in full care of the flight crew on the heli-pad at 1242 hours. (The patient was on DLF's equipment during this time, so patient event isn't recorded on Epic through our monitors in [place].)

## 2018-05-03 NOTE — Progress Notes (Signed)
ABG results relayed to Darlyn Chamber, NP

## 2018-05-03 NOTE — Progress Notes (Signed)
Initial Nutrition Assessment  DOCUMENTATION CODES:   Morbid obesity  INTERVENTION:   If tube feeds initiated, recommend vital HP @10ml /hr + Prostat 10ml TID via tube  Propofol: 18.44 ml/hr- provides 487kcal/day   Free water flushes 25ml q 4 hours  Regimen provides 1327kcal/day, 111g/day protein, 360ml/day free water  Recommend liquid MVI daily via tube   NUTRITION DIAGNOSIS:   Inadequate oral intake related to acute illness(pt sedated and ventilated ) as evidenced by NPO status  GOAL:   Provide needs based on ASPEN/SCCM guidelines  MONITOR:   Vent status, Labs, Weight trends, Skin, I & O's  REASON FOR ASSESSMENT:   Ventilator    ASSESSMENT:   22 year old African-American female who is morbidly obese and has history of hypertension admitted to hospital secondary to fevers and chills and dyspnea and is being treated for pneumonia   Pt sedated and ventilated. Pt eating only sips and bites prior to intubation. There is no recent weight history in chart to determine if any weight loss. OGT in place. Pt awaiting [facility] to [facility].   Medications reviewed and include: fentanyl, heparin, cefepime, nimbex, pepcid, levophed, propofol, vancomycin, vasopressin  Labs reviewed: K 4.7 wnl, P 5.1(H), Mg 1.8 wnl Wbc- 50.4(H), Hgb 8.8(L), Hct 27.4(L), MCV 75.9(L), MCH 24.4(L)  Patient is currently intubated on ventilator support MV: 13.2 L/min Temp (24hrs), Avg:100.4 F (38 C), Min:97.9 F (36.6 C), Max:103 F (39.4 C)  Propofol: 18.44 ml/hr- provides 487kcal/day   MAP- >75mmHg  UOP- 1954ml x 24 hrs  NUTRITION - FOCUSED PHYSICAL EXAM:    Most Recent Value  Orbital Region  No depletion  Upper Arm Region  No depletion  Thoracic and Lumbar Region  No depletion  Buccal Region  No depletion  Temple Region  No depletion  Clavicle Bone Region  No depletion  Clavicle and Acromion Bone Region  No depletion  Scapular Bone Region  No depletion  Dorsal Hand  No depletion   Patellar Region  No depletion  Anterior Thigh Region  No depletion  Posterior Calf Region  No depletion  Edema (RD Assessment)  None  Hair  Reviewed  Eyes  Reviewed  Mouth  Reviewed  Skin  Reviewed  Nails  Reviewed     Diet Order:   Diet Order            Diet NPO time specified  Diet effective now             EDUCATION NEEDS:   No education needs have been identified at this time  Skin:  Skin Assessment: Reviewed RN Assessment(ecchymosis )  Last BM:  11/28- type 5  Height:   Ht Readings from Last 1 Encounters:  05/02/18 5\' 1"  (1.549 m)    Weight:   Wt Readings from Last 1 Encounters:  05/03/18 124.9 kg    Ideal Body Weight:  47.7 kg  BMI:  Body mass index is 52.03 kg/m.  Estimated Nutritional Needs:   Kcal:  1050-1193kcal/day  Protein:  >119g/day   Fluid:  >1.6L/day   Koleen Distance MS, RD, LDN Pager #- [phone] Office#- [phone] After Hours Pager: [phone]

## 2018-05-03 NOTE — Care Management (Signed)
Patient transferred to ICU 11/28 for worsening respiratory failure due to her pneumonia/ ARDS.  She required intubation later that evening. There is discussion of transfer to facility that can provide prone ventilation protocol which can not be provided at [facility].

## 2018-05-05 LAB — CULTURE, BLOOD (ROUTINE X 2)
Culture: NO GROWTH
Culture: NO GROWTH
SPECIAL REQUESTS: ADEQUATE
Special Requests: ADEQUATE

## 2018-05-05 LAB — CULTURE, RESPIRATORY

## 2018-05-05 LAB — CULTURE, RESPIRATORY W GRAM STAIN: Culture: NORMAL

## 2018-05-09 LAB — BLOOD GAS, ARTERIAL
Acid-base deficit: 8.6 mmol/L — ABNORMAL HIGH (ref 0.0–2.0)
Allens test (pass/fail): POSITIVE — AB
Bicarbonate: 20.4 mmol/L (ref 20.0–28.0)
FIO2: 100
MECHVT: 400 mL
Mechanical Rate: 28
O2 Saturation: 91.9 %
PEEP: 8 cmH2O
Patient temperature: 37
RATE: 28 resp/min
pCO2 arterial: 56 mmHg — ABNORMAL HIGH (ref 32.0–48.0)
pH, Arterial: 7.17 — CL (ref 7.350–7.450)
pO2, Arterial: 80 mmHg — ABNORMAL LOW (ref 83.0–108.0)

## 2018-05-20 ENCOUNTER — Encounter: Payer: Self-pay | Admitting: Emergency Medicine

## 2018-05-20 ENCOUNTER — Other Ambulatory Visit: Payer: Self-pay

## 2018-05-20 ENCOUNTER — Emergency Department
Admission: EM | Admit: 2018-05-20 | Discharge: 2018-05-20 | Disposition: A | Discharge status: Home / Self Care | Payer: Self-pay | Attending: Emergency Medicine | Admitting: Emergency Medicine

## 2018-05-20 ENCOUNTER — Emergency Department: Payer: Self-pay

## 2018-05-20 DIAGNOSIS — L089 Local infection of the skin and subcutaneous tissue, unspecified: Secondary | ICD-10-CM

## 2018-05-20 DIAGNOSIS — I10 Essential (primary) hypertension: Secondary | ICD-10-CM | POA: Insufficient documentation

## 2018-05-20 DIAGNOSIS — R000 Tachycardia, unspecified: Secondary | ICD-10-CM | POA: Insufficient documentation

## 2018-05-20 DIAGNOSIS — Y829 Unspecified medical devices associated with adverse incidents: Secondary | ICD-10-CM | POA: Insufficient documentation

## 2018-05-20 DIAGNOSIS — T148XXA Other injury of unspecified body region, initial encounter: Secondary | ICD-10-CM

## 2018-05-20 DIAGNOSIS — Z7901 Long term (current) use of anticoagulants: Secondary | ICD-10-CM | POA: Insufficient documentation

## 2018-05-20 DIAGNOSIS — Z5189 Encounter for other specified aftercare: Secondary | ICD-10-CM

## 2018-05-20 DIAGNOSIS — F17290 Nicotine dependence, other tobacco product, uncomplicated: Secondary | ICD-10-CM | POA: Insufficient documentation

## 2018-05-20 DIAGNOSIS — T8142XA Infection following a procedure, deep incisional surgical site, initial encounter: Secondary | ICD-10-CM | POA: Insufficient documentation

## 2018-05-20 DIAGNOSIS — Z86711 Personal history of pulmonary embolism: Secondary | ICD-10-CM | POA: Insufficient documentation

## 2018-05-20 DIAGNOSIS — R05 Cough: Secondary | ICD-10-CM | POA: Insufficient documentation

## 2018-05-20 DIAGNOSIS — Z79899 Other long term (current) drug therapy: Secondary | ICD-10-CM | POA: Insufficient documentation

## 2018-05-20 LAB — CBC WITH DIFFERENTIAL/PLATELET
Abs Immature Granulocytes: 0.12 10*3/uL — ABNORMAL HIGH (ref 0.00–0.07)
Basophils Absolute: 0.1 10*3/uL (ref 0.0–0.1)
Basophils Relative: 1 %
Eosinophils Absolute: 0.3 10*3/uL (ref 0.0–0.5)
Eosinophils Relative: 2 %
HCT: 33.9 % — ABNORMAL LOW (ref 36.0–46.0)
Hemoglobin: 11.5 g/dL — ABNORMAL LOW (ref 12.0–15.0)
Immature Granulocytes: 1 %
Lymphocytes Relative: 18 %
Lymphs Abs: 2.9 10*3/uL (ref 0.7–4.0)
MCH: 28.4 pg (ref 26.0–34.0)
MCHC: 33.9 g/dL (ref 30.0–36.0)
MCV: 83.7 fL (ref 80.0–100.0)
MONOS PCT: 7 %
Monocytes Absolute: 1.1 10*3/uL — ABNORMAL HIGH (ref 0.1–1.0)
Neutro Abs: 11.9 10*3/uL — ABNORMAL HIGH (ref 1.7–7.7)
Neutrophils Relative %: 71 %
Platelets: 603 10*3/uL — ABNORMAL HIGH (ref 150–400)
RBC: 4.05 MIL/uL (ref 3.87–5.11)
RDW: 15.7 % — ABNORMAL HIGH (ref 11.5–15.5)
WBC: 16.3 10*3/uL — ABNORMAL HIGH (ref 4.0–10.5)
nRBC: 0 % (ref 0.0–0.2)

## 2018-05-20 LAB — COMPREHENSIVE METABOLIC PANEL
ALT: 28 U/L (ref 0–44)
AST: 26 U/L (ref 15–41)
Albumin: 3.9 g/dL (ref 3.5–5.0)
Alkaline Phosphatase: 61 U/L (ref 38–126)
Anion gap: 10 (ref 5–15)
BUN: 11 mg/dL (ref 6–20)
CHLORIDE: 99 mmol/L (ref 98–111)
CO2: 25 mmol/L (ref 22–32)
CREATININE: 0.92 mg/dL (ref 0.44–1.00)
Calcium: 9.5 mg/dL (ref 8.9–10.3)
GFR calc Af Amer: >60 mL/min (ref >60)
GFR calc non Af Amer: >60 mL/min (ref >60)
Glucose, Bld: 86 mg/dL (ref 70–99)
Potassium: 3.4 mmol/L — ABNORMAL LOW (ref 3.5–5.1)
Sodium: 134 mmol/L — ABNORMAL LOW (ref 135–145)
Total Bilirubin: 0.7 mg/dL (ref 0.3–1.2)
Total Protein: 8.3 g/dL — ABNORMAL HIGH (ref 6.5–8.1)

## 2018-05-20 LAB — CG4 I-STAT (LACTIC ACID): Lactic Acid, Venous: 1.52 mmol/L (ref 0.5–1.9)

## 2018-05-20 LAB — BRAIN NATRIURETIC PEPTIDE: B Natriuretic Peptide: 18 pg/mL (ref 0.0–100.0)

## 2018-05-20 MED ORDER — CEPHALEXIN 500 MG PO CAPS: 500.0000 mg | ORAL_CAPSULE | Freq: Three times a day (TID) | ORAL | 0 refills | Status: DC

## 2018-05-20 MED ORDER — SULFAMETHOXAZOLE-TRIMETHOPRIM 800-160 MG PO TABS: 1.0000 | ORAL_TABLET | Freq: Two times a day (BID) | ORAL | 0 refills | Status: DC

## 2018-05-20 NOTE — ED Triage Notes (Signed)
Here for suture and staple removal  States she was on life support and released from hospital last week  Sutures and staples to neck and groin areas

## 2018-05-20 NOTE — ED Provider Notes (Signed)
Medical screening examination/treatment/procedure(s) were conducted as a shared visit with non-physician practitioner(s) and myself.  I personally evaluated the patient during the encounter.  Femoral access sites for her recent VV ECMO show some skin thickening likely due to scar formation and healing process.  No frank purulence tenderness warmth or redness, but with comorbidities and recent severe illness, plan to treat with Bactrim and Keflex to cover skin flora.    Carrie Mew, MD 05/20/18 [phone]

## 2018-05-20 NOTE — Discharge Instructions (Signed)
Follow-up with your regular doctor in 2 days for suture removal.  Or you may return here for suture removal.  Take the medications as prescribed.  Return if worsening.

## 2018-05-20 NOTE — ED Provider Notes (Signed)
[facility] Emergency Department Provider Note  ____________________________________________   First MD Initiated Contact with Patient 05/20/18 1139     (approximate)  I have reviewed the triage vital signs and the nursing notes.   HISTORY  Chief Complaint Suture / Staple Removal    HPI Yesenia Carrillo is a 22 y.o. female presents emergency department for "staple and suture removal ".  However she also states she needs a recheck which she was supposed to do at her primary care doctors but decided to come here.  She recently had bilateral pneumonia which got worse and she was transferred to  [facility].  At [facility] she had ECMO, was placed on life support after being in acute respiratory distress due to a PE.Marland Kitchen  She was started on Lovenox while in the hospital and is now on Eliquis.  She was discharged from [facility] on 05/15/2018.  She has been complaining of a discharge from the suture sites due to the femoral incision sites and the EJ.  Past Medical History:  Diagnosis Date  . Hypertension     Patient Active Problem List   Diagnosis Date Noted  . Acute respiratory failure with hypoxia ([place])   . CAP (community acquired pneumonia) 04/30/2018    Past Surgical History:  Procedure Laterality Date  . TONSILLECTOMY    . WISDOM TOOTH EXTRACTION      Prior to Admission medications   Medication Sig Start Date End Date Taking? Authorizing Provider  acetaminophen (TYLENOL) 650 MG suppository Place 1 suppository (650 mg total) rectally every 6 (six) hours as needed for mild pain (or Fever >/= 101). 05/03/18   Cassandria Santee, MD  ceFEPIme 2 g in sodium chloride 0.9 % 100 mL Inject 2 g into the vein every 12 (twelve) hours. 05/03/18   Cassandria Santee, MD  cephALEXin (KEFLEX) 500 MG capsule Take 1 capsule (500 mg total) by mouth 3 (three) times daily. 05/20/18   Versie Starks, PA-C  cisatracurium 200 mg in sodium chloride 0.9 % 180 mL Inject 368.7-1,229 mcg/min into the vein  continuous. 05/03/18   Cassandria Santee, MD  famotidine (PEPCID) 20-0.9 MG/50ML-% Inject 50 mLs (20 mg total) into the vein every 12 (twelve) hours. 05/03/18   Cassandria Santee, MD  fentaNYL 10 mcg/ml SOLN infusion Inject 100-300 mcg/hr into the vein continuous. 05/03/18   Cassandria Santee, MD  heparin 5000 UNIT/ML injection Inject 1 mL (5,000 Units total) into the skin every 8 (eight) hours. 05/03/18   Cassandria Santee, MD  hydrocortisone sodium succinate (SOLU-CORTEF) 100 MG SOLR injection Inject 1 mL (50 mg total) into the vein every 6 (six) hours. 05/03/18   Samaan, Maged, MD  ipratropium-albuterol (DUONEB) 0.5-2.5 (3) MG/3ML SOLN Take 3 mLs by nebulization every 4 (four) hours as needed. 05/03/18   Cassandria Santee, MD  midazolam (VERSED) 2 MG/2ML SOLN injection Inject 2 mLs (2 mg total) into the vein every 15 (fifteen) minutes as needed (to acheive RASS goal.). 05/03/18   Cassandria Santee, MD  norepinephrine (LEVOPHED) 16-5 MG/250ML-% SOLN Inject 0-40 mcg/min into the vein continuous. 05/03/18   Cassandria Santee, MD  propofol (DIPRIVAN) 1000 MG/100ML EMUL injection Inject 3,072.5-9,832 mcg/min into the vein continuous. 05/03/18   Cassandria Santee, MD  sulfamethoxazole-trimethoprim (BACTRIM DS,SEPTRA DS) 800-160 MG tablet Take 1 tablet by mouth 2 (two) times daily. 05/20/18   Versie Starks, PA-C  vasopressin 40 Units in sodium chloride 0.9 % 250 mL Inject 0.03 Units/min into the vein continuous. 05/03/18   Cassandria Santee,  MD    Allergies Ampicillin and Penicillins  No family history on file.  Social History Social History   Tobacco Use  . Smoking status: Current Every Day Smoker    Types: Cigars  . Smokeless tobacco: Never Used  Substance Use Topics  . Alcohol use: Yes    Comment: monthly  . Drug use: No    Review of Systems  Constitutional: No fever/chills Eyes: No visual changes. ENT: No sore throat. Respiratory: Denies cough Genitourinary: Negative for dysuria. Musculoskeletal: Negative for  back pain. Skin: Negative for rash.  Positive for drainage at the suture sites    ____________________________________________   PHYSICAL EXAM:  VITAL SIGNS: ED Triage Vitals  Enc Vitals Group     BP 05/20/18 1149 (!) 137/93     Pulse Rate 05/20/18 1149 (!) 123     Resp 05/20/18 1149 18     Temp 05/20/18 1149 98.7 F (37.1 C)     Temp Source 05/20/18 1149 Oral     SpO2 05/20/18 1149 98 %     Weight 05/20/18 1143 273 lb 5.9 oz (124 kg)     Height 05/20/18 1143 5\' 1"  (1.549 m)     Head Circumference --      Peak Flow --      Pain Score 05/20/18 1424 0     Pain Loc --      Pain Edu? --      Excl. in [place]? --     Constitutional: Alert and oriented. Well appearing and in no acute distress. Eyes: Conjunctivae are normal.  Head: Atraumatic. Nose: No congestion/rhinnorhea. Mouth/Throat: Mucous membranes are moist.   Neck:  supple no lymphadenopathy noted Cardiovascular: Tachycardic, regular rhythm. Heart sounds are normal Respiratory: Normal respiratory effort.  No retractions, lungs c t a  Abd: soft nontender bs normal all 4 quad GU: deferred Musculoskeletal: FROM all extremities, warm and well perfused Neurologic:  Normal speech and language.  Skin:  Skin is warm, dry .  There is some drainage noted from the EJ and femoral sites. Psychiatric: Mood and affect are normal. Speech and behavior are normal.  ____________________________________________   LABS (all labs ordered are listed, but only abnormal results are displayed)  Labs Reviewed  CBC WITH DIFFERENTIAL/PLATELET - Abnormal; Notable for the following components:      Result Value   WBC 16.3 (*)    Hemoglobin 11.5 (*)    HCT 33.9 (*)    RDW 15.7 (*)    Platelets 603 (*)    Neutro Abs 11.9 (*)    Monocytes Absolute 1.1 (*)    Abs Immature Granulocytes 0.12 (*)    All other components within normal limits  COMPREHENSIVE METABOLIC PANEL - Abnormal; Notable for the following components:   Sodium 134 (*)     Potassium 3.4 (*)    Total Protein 8.3 (*)    All other components within normal limits  BRAIN NATRIURETIC PEPTIDE  I-STAT CG4 LACTIC ACID, ED  CG4 I-STAT (LACTIC ACID)   ____________________________________________   ____________________________________________  RADIOLOGY  Chest x-ray is negative for any acute abnormality  ____________________________________________   PROCEDURES  Procedure(s) performed: No  Procedures    ____________________________________________   INITIAL IMPRESSION / ASSESSMENT AND PLAN / ED COURSE  Pertinent labs & imaging results that were available during my care of the patient were reviewed by me and considered in my medical decision making (see chart for details).   Patient is 22 year old female presents emergency department for a "checkup  and suture removal "after being discharged from [place] on 05/15/2018.  She had ARDS and had been placed on life support.  They said that she needed to recheck with her regular doctor and have the sutures removed on the 18th.  She denies fever or chills.  States still has cough.  She is taking her Eliquis and hydrochlorothiazide  Physical exam shows a nontoxic patient.  The wound sites for the EJ and femoral artery for ECMO both show some drainage.  Discussed this with Dr. Joni Fears.  He instructed me to do labs and a chest x-ray.  I-STAT lactic acid is negative at 1.52, comprehensive metabolic panel is basically normal, BNP is negative, however the CBC has an elevated WBC at 16.3, decreased hemoglobin and hematocrit,  Discussed lab findings with Dr. Joni Fears.  Dr. Joni Fears in to evaluate the patient.  He agrees that we should start her on a antibiotic due to the drainage from the wound sites.  She was placed on Keflex and Septra.  She is to see her regular doctor in 2 days for suture removal.  Return if worsening.  She states she understands will comply.  She is discharged in stable condition.     As part of my  medical decision making, I reviewed the following data within the [place] notes reviewed and incorporated, Labs reviewed as noted above, Old chart reviewed, Radiograph reviewed chest x-ray is negative, Evaluated by EM attending Dr. Joni Fears, Notes from prior ED visits and [place] Controlled Substance Database  ____________________________________________   FINAL CLINICAL IMPRESSION(S) / ED DIAGNOSES  Final diagnoses:  Visit for wound check  Tachycardia  Wound infection      NEW MEDICATIONS STARTED DURING THIS VISIT:  Discharge Medication List as of 05/20/2018  2:04 PM    START taking these medications   Details  cephALEXin (KEFLEX) 500 MG capsule Take 1 capsule (500 mg total) by mouth 3 (three) times daily., Starting Mon 05/20/2018, Normal    sulfamethoxazole-trimethoprim (BACTRIM DS,SEPTRA DS) 800-160 MG tablet Take 1 tablet by mouth 2 (two) times daily., Starting Mon 05/20/2018, Normal         Note:  This document was prepared using Dragon voice recognition software and may include unintentional dictation errors.    Versie Starks, PA-C 05/20/18 1541    Carrie Mew, MD 05/21/18 [phone]

## 2018-05-22 ENCOUNTER — Emergency Department
Admission: EM | Admit: 2018-05-22 | Discharge: 2018-05-22 | Disposition: A | Discharge status: Home / Self Care | Payer: Medicaid Other | Attending: Emergency Medicine | Admitting: Emergency Medicine

## 2018-05-22 ENCOUNTER — Other Ambulatory Visit: Payer: Self-pay

## 2018-05-22 ENCOUNTER — Encounter: Payer: Self-pay | Admitting: *Deleted

## 2018-05-22 DIAGNOSIS — Z79899 Other long term (current) drug therapy: Secondary | ICD-10-CM | POA: Insufficient documentation

## 2018-05-22 DIAGNOSIS — Z4802 Encounter for removal of sutures: Secondary | ICD-10-CM

## 2018-05-22 DIAGNOSIS — I10 Essential (primary) hypertension: Secondary | ICD-10-CM | POA: Insufficient documentation

## 2018-05-22 DIAGNOSIS — S71131D Puncture wound without foreign body, right thigh, subsequent encounter: Secondary | ICD-10-CM | POA: Insufficient documentation

## 2018-05-22 DIAGNOSIS — B379 Candidiasis, unspecified: Secondary | ICD-10-CM

## 2018-05-22 DIAGNOSIS — X58XXXD Exposure to other specified factors, subsequent encounter: Secondary | ICD-10-CM | POA: Insufficient documentation

## 2018-05-22 DIAGNOSIS — Z4889 Encounter for other specified surgical aftercare: Secondary | ICD-10-CM | POA: Insufficient documentation

## 2018-05-22 DIAGNOSIS — F17290 Nicotine dependence, other tobacco product, uncomplicated: Secondary | ICD-10-CM | POA: Insufficient documentation

## 2018-05-22 LAB — COMPREHENSIVE METABOLIC PANEL WITH GFR
ALT: 23 U/L (ref 0–44)
AST: 27 U/L (ref 15–41)
Albumin: 3.9 g/dL (ref 3.5–5.0)
Alkaline Phosphatase: 62 U/L (ref 38–126)
Anion gap: 8 (ref 5–15)
BUN: 12 mg/dL (ref 6–20)
CO2: 27 mmol/L (ref 22–32)
Calcium: 9.5 mg/dL (ref 8.9–10.3)
Chloride: 100 mmol/L (ref 98–111)
Creatinine, Ser: 0.98 mg/dL (ref 0.44–1.00)
GFR calc Af Amer: >60 mL/min
GFR calc non Af Amer: >60 mL/min
Glucose, Bld: 92 mg/dL (ref 70–99)
Potassium: 3.6 mmol/L (ref 3.5–5.1)
Sodium: 135 mmol/L (ref 135–145)
Total Bilirubin: 0.7 mg/dL (ref 0.3–1.2)
Total Protein: 8.5 g/dL — ABNORMAL HIGH (ref 6.5–8.1)

## 2018-05-22 LAB — CBC WITH DIFFERENTIAL/PLATELET
Abs Immature Granulocytes: 0.13 10*3/uL — ABNORMAL HIGH (ref 0.00–0.07)
Basophils Absolute: 0.1 10*3/uL (ref 0.0–0.1)
Basophils Relative: 0 %
Eosinophils Absolute: 0.3 10*3/uL (ref 0.0–0.5)
Eosinophils Relative: 2 %
HCT: 34.2 % — ABNORMAL LOW (ref 36.0–46.0)
Hemoglobin: 11.6 g/dL — ABNORMAL LOW (ref 12.0–15.0)
Immature Granulocytes: 1 %
Lymphocytes Relative: 16 %
Lymphs Abs: 3 10*3/uL (ref 0.7–4.0)
MCH: 28.2 pg (ref 26.0–34.0)
MCHC: 33.9 g/dL (ref 30.0–36.0)
MCV: 83.2 fL (ref 80.0–100.0)
Monocytes Absolute: 1.3 10*3/uL — ABNORMAL HIGH (ref 0.1–1.0)
Monocytes Relative: 7 %
Neutro Abs: 13.5 10*3/uL — ABNORMAL HIGH (ref 1.7–7.7)
Neutrophils Relative %: 74 %
Platelets: 583 10*3/uL — ABNORMAL HIGH (ref 150–400)
RBC: 4.11 MIL/uL (ref 3.87–5.11)
RDW: 15.8 % — ABNORMAL HIGH (ref 11.5–15.5)
WBC: 18.3 10*3/uL — ABNORMAL HIGH (ref 4.0–10.5)
nRBC: 0 % (ref 0.0–0.2)

## 2018-05-22 MED ORDER — NYSTATIN 100000 UNIT/GM EX OINT: 1.0000 "application " | TOPICAL_OINTMENT | Freq: Two times a day (BID) | CUTANEOUS | 0 refills | Status: DC

## 2018-05-22 NOTE — ED Notes (Addendum)
See triage note  Presents for staples and suture removal  Was here 2 days ago for recheck  Denies any fever or other sx's  Area to neck was draining 2 day ago

## 2018-05-22 NOTE — ED Provider Notes (Signed)
[facility] Emergency Department Provider Note  ____________________________________________  Time seen: Approximately 5:22 PM  I have reviewed the triage vital signs and the nursing notes.   HISTORY  Chief Complaint Suture / Staple Removal    HPI Yesenia Carrillo is a 22 y.o. female presents emergency department for suture and staple remover.  Patient was at [facility] from November 29 through December 11 for pneumonia and respiratory failure. She was told to follow up with primary care and to have sutures removed on Dec 18. She was scheduled to have a primary care appointment 2 days ago but decided to come to the emergency department instead.  She was seen in the emergency department and was told to see primary care in 2 days for suture removal. She states that she has a home health nurse that has been changing dressings.  No fevers, no significant pain, or drainage from wounds.  She still has an occasional cough but has improved significantly since being at [facility].  No shortness of breath or chest pain.   Past Medical History:  Diagnosis Date  . Hypertension     Patient Active Problem List   Diagnosis Date Noted  . Acute respiratory failure with hypoxia ([place])   . CAP (community acquired pneumonia) 04/30/2018    Past Surgical History:  Procedure Laterality Date  . TONSILLECTOMY    . WISDOM TOOTH EXTRACTION      Prior to Admission medications   Medication Sig Start Date End Date Taking? Authorizing Provider  acetaminophen (TYLENOL) 650 MG suppository Place 1 suppository (650 mg total) rectally every 6 (six) hours as needed for mild pain (or Fever >/= 101). 05/03/18   Cassandria Santee, MD  ceFEPIme 2 g in sodium chloride 0.9 % 100 mL Inject 2 g into the vein every 12 (twelve) hours. 05/03/18   Cassandria Santee, MD  cephALEXin (KEFLEX) 500 MG capsule Take 1 capsule (500 mg total) by mouth 3 (three) times daily. 05/20/18   Versie Starks, PA-C  cisatracurium 200 mg in  sodium chloride 0.9 % 180 mL Inject 368.7-1,229 mcg/min into the vein continuous. 05/03/18   Cassandria Santee, MD  famotidine (PEPCID) 20-0.9 MG/50ML-% Inject 50 mLs (20 mg total) into the vein every 12 (twelve) hours. 05/03/18   Cassandria Santee, MD  fentaNYL 10 mcg/ml SOLN infusion Inject 100-300 mcg/hr into the vein continuous. 05/03/18   Cassandria Santee, MD  heparin 5000 UNIT/ML injection Inject 1 mL (5,000 Units total) into the skin every 8 (eight) hours. 05/03/18   Cassandria Santee, MD  hydrocortisone sodium succinate (SOLU-CORTEF) 100 MG SOLR injection Inject 1 mL (50 mg total) into the vein every 6 (six) hours. 05/03/18   Samaan, Maged, MD  ipratropium-albuterol (DUONEB) 0.5-2.5 (3) MG/3ML SOLN Take 3 mLs by nebulization every 4 (four) hours as needed. 05/03/18   Cassandria Santee, MD  midazolam (VERSED) 2 MG/2ML SOLN injection Inject 2 mLs (2 mg total) into the vein every 15 (fifteen) minutes as needed (to acheive RASS goal.). 05/03/18   Cassandria Santee, MD  norepinephrine (LEVOPHED) 16-5 MG/250ML-% SOLN Inject 0-40 mcg/min into the vein continuous. 05/03/18   Cassandria Santee, MD  nystatin ointment (MYCOSTATIN) Apply 1 application topically 2 (two) times daily. 05/22/18   Laban Emperor, PA-C  propofol (DIPRIVAN) 1000 MG/100ML EMUL injection Inject 3,072.5-9,832 mcg/min into the vein continuous. 05/03/18   Cassandria Santee, MD  sulfamethoxazole-trimethoprim (BACTRIM DS,SEPTRA DS) 800-160 MG tablet Take 1 tablet by mouth 2 (two) times daily. 05/20/18   Versie Starks,  PA-C  vasopressin 40 Units in sodium chloride 0.9 % 250 mL Inject 0.03 Units/min into the vein continuous. 05/03/18   Cassandria Santee, MD    Allergies Ampicillin and Penicillins  History reviewed. No pertinent family history.  Social History Social History   Tobacco Use  . Smoking status: Current Every Day Smoker    Types: Cigars  . Smokeless tobacco: Never Used  Substance Use Topics  . Alcohol use: Yes    Comment: monthly  . Drug use: No      Review of Systems  Constitutional: No fever/chills Cardiovascular: No chest pain. Respiratory: No SOB. Gastrointestinal: No abdominal pain.  No nausea, no vomiting.  Musculoskeletal: Negative for musculoskeletal pain. Skin: Negative for ecchymosis. Neurological: Negative for headaches   ____________________________________________   PHYSICAL EXAM:  VITAL SIGNS: ED Triage Vitals  Enc Vitals Group     BP 05/22/18 1349 122/80     Pulse Rate 05/22/18 1349 88     Resp 05/22/18 1349 18     Temp 05/22/18 1349 97.8 F (36.6 C)     Temp Source 05/22/18 1349 Oral     SpO2 05/22/18 1349 100 %     Weight 05/22/18 1346 273 lb 5.9 oz (124 kg)     Height 05/22/18 1346 5\' 1"  (1.549 m)     Head Circumference --      Peak Flow --      Pain Score 05/22/18 1346 0     Pain Loc --      Pain Edu? --      Excl. in [place]? --      Constitutional: Alert and oriented. Well appearing and in no acute distress. Eyes: Conjunctivae are normal. PERRL. EOMI. Head: Atraumatic. ENT:      Ears:      Nose: No congestion/rhinnorhea.      Mouth/Throat: Mucous membranes are moist.  Neck: No stridor. Cardiovascular: Normal rate, regular rhythm.  Good peripheral circulation. Respiratory: Normal respiratory effort without tachypnea or retractions. Lungs CTAB. Good air entry to the bases with no decreased or absent breath sounds. Musculoskeletal: Full range of motion to all extremities. No gross deformities appreciated.  Well-healing incision to right neck without erythema or drainage.  Ulceration and granulation tissue to right groin with serosanguineous drainage.  Large well-healing incision to left groin with mild tenderness to palpation and some induration.  Brown scant drainage with odor to bilateral groin. Neurologic:  Normal speech and language. No gross focal neurologic deficits are appreciated.  Skin:  Skin is warm, dry and intact. No rash noted. Psychiatric: Mood and affect are normal. Speech and  behavior are normal. Patient exhibits appropriate insight and judgement.   ____________________________________________   LABS (all labs ordered are listed, but only abnormal results are displayed)  Labs Reviewed  CBC WITH DIFFERENTIAL/PLATELET - Abnormal; Notable for the following components:      Result Value   WBC 18.3 (*)    Hemoglobin 11.6 (*)    HCT 34.2 (*)    RDW 15.8 (*)    Platelets 583 (*)    Neutro Abs 13.5 (*)    Monocytes Absolute 1.3 (*)    Abs Immature Granulocytes 0.13 (*)    All other components within normal limits  COMPREHENSIVE METABOLIC PANEL - Abnormal; Notable for the following components:   Total Protein 8.5 (*)    All other components within normal limits   ____________________________________________  EKG   ____________________________________________  RADIOLOGY   No results found.  ____________________________________________  PROCEDURES  Procedure(s) performed:    Procedures  SUTURE REMOVAL Performed by: Laban Emperor  Consent: Verbal consent obtained. Patient identity confirmed: provided demographic data Time out: Immediately prior to procedure a "time out" was called to verify the correct patient, procedure, equipment, support staff and site/side marked as required.  Location details: right neck   Wound Appearance: clean  Sutures/Staples Removed: 1  Facility: sutures placed in this facility Patient tolerance: Patient tolerated the procedure well with no immediate complications.  SUTURE REMOVAL Performed by: Laban Emperor  Consent: Verbal consent obtained. Patient identity confirmed: provided demographic data Time out: Immediately prior to procedure a "time out" was called to verify the correct patient, procedure, equipment, support staff and site/side marked as required.  Location details: left groin  Wound Appearance: clean  Sutures/Staples Removed: 16  Facility: sutures placed in this facility Patient  tolerance: Patient tolerated the procedure well with no immediate complications.  SUTURE REMOVAL Performed by: Laban Emperor  Consent: Verbal consent obtained. Patient identity confirmed: provided demographic data Time out: Immediately prior to procedure a "time out" was called to verify the correct patient, procedure, equipment, support staff and site/side marked as required.  Location details: right groin  Wound Appearance: clean  Sutures/Staples Removed: 3  Facility: sutures placed in this facility Patient tolerance: Patient tolerated the procedure well with no immediate complications.    Medications - No data to display   ____________________________________________   INITIAL IMPRESSION / ASSESSMENT AND PLAN / ED COURSE  Pertinent labs & imaging results that were available during my care of the patient were reviewed by me and considered in my medical decision making (see chart for details).  Review of the [place] CSRS was performed in accordance of the [place] prior to dispensing any controlled drugs.     Patient presented the emergency department for suture removal.  Patient denies any systemic symptoms.  Wounds do not show a current sign of infection and appear to be healing well. She will continue Bactrim and Keflex.  Patient's exam is consistent with a yeast infection.     Patient's white blood cell count is mildly elevated from previous at 18.3.  Patient does not appear toxic. Dr. Jacqualine Code was consulted and came to see the patient.  He inspected incisions and reviewed lab work and recommends discharge home with outpatient follow-up.  Patient still has 1 suture in place to left thigh that I was unable to remove and that will need to be removed by wound care  Patient will be discharged home with prescriptions for nystatin. Patient is to follow up with primary care, wound care as directed. Patient is given ED precautions to return to the ED for any worsening or new  symptoms.     ____________________________________________  FINAL CLINICAL IMPRESSION(S) / ED DIAGNOSES  Final diagnoses:  Visit for suture removal  Suture check  Yeast infection      NEW MEDICATIONS STARTED DURING THIS VISIT:  ED Discharge Orders         Ordered    nystatin ointment (MYCOSTATIN)  2 times daily     05/22/18 1704              This chart was dictated using voice recognition software/Dragon. Despite best efforts to proofread, errors can occur which can change the meaning. Any change was purely unintentional.    Laban Emperor, PA-C 05/22/18 1843    Delman Kitten, MD 05/22/18 [id number]

## 2018-05-22 NOTE — ED Notes (Signed)
Incision areas cleansed and dry dressings applied

## 2018-05-22 NOTE — ED Triage Notes (Signed)
Pt to ED to have multiple sutures and staples removed. Pt has them located in her groin and in her neck but verbalized the sutures in her neck may be dissolvable.   Pt was at [facility] in acute distress with pneumonia but reports feeling back to baseline.

## 2018-05-22 NOTE — Discharge Instructions (Signed)
Please continue Keflex and Bactrim to cover for skin infection.  Start nystatin cream.  Please follow-up with wound center and primary care.  There is still a suture in place to your left leg that will need to be removed by wound center or [facility] surgery.

## 2018-05-22 NOTE — ED Provider Notes (Signed)
Medical screening examination/treatment/procedure(s) were conducted as a shared visit with non-physician practitioner(s) and myself.  I personally evaluated the patient during the encounter.  Today's Vitals   05/22/18 1346 05/22/18 1349  BP:  122/80  Pulse:  88  Resp:  18  Temp:  97.8 F (36.6 C)  TempSrc:  Oral  SpO2:  100%  Weight: 124 kg   Height: 5\' 1"  (1.549 m)   PainSc: 0-No pain    Body mass index is 51.65 kg/m.   Patient denies any acute infectious symptoms.  No systemic symptoms.  No fevers.  She reports that her home health nurses told her that that his wound seem healing.  I did tell her that one suture is not able to be removed today, and that she will need further follow-up on this within the week.  She is to follow-up with primary on Monday.  Her wounds look to be healing, the right groin puncture site does have some granulation tissue surrounding, no erythema or induration.  No purulence.  Has some serosanguineous slight amount of drainage and she reports she seen a slight pinkish to clear drainage from the area but no pus.  No surrounding cellulitis.  She is currently on antibiotics, will have her continue on same and continue to follow her wounds carefully.  Also noted in the intertriginous regions of the groin bilaterally a abnormal odor and some satellite lesions consistent with probable yeast infection.  We will also treat with nystatin.     Delman Kitten, MD 05/22/18 1622

## 2018-11-03 ENCOUNTER — Other Ambulatory Visit: Payer: Self-pay

## 2018-11-03 ENCOUNTER — Emergency Department
Admission: EM | Admit: 2018-11-03 | Discharge: 2018-11-03 | Disposition: A | Discharge status: Home / Self Care | Payer: Medicaid Other | Attending: Emergency Medicine | Admitting: Emergency Medicine

## 2018-11-03 ENCOUNTER — Emergency Department: Payer: Medicaid Other

## 2018-11-03 DIAGNOSIS — J029 Acute pharyngitis, unspecified: Secondary | ICD-10-CM | POA: Insufficient documentation

## 2018-11-03 DIAGNOSIS — R0789 Other chest pain: Secondary | ICD-10-CM | POA: Insufficient documentation

## 2018-11-03 DIAGNOSIS — Z20828 Contact with and (suspected) exposure to other viral communicable diseases: Secondary | ICD-10-CM | POA: Insufficient documentation

## 2018-11-03 DIAGNOSIS — R0602 Shortness of breath: Secondary | ICD-10-CM

## 2018-11-03 DIAGNOSIS — Z79899 Other long term (current) drug therapy: Secondary | ICD-10-CM | POA: Insufficient documentation

## 2018-11-03 DIAGNOSIS — F17210 Nicotine dependence, cigarettes, uncomplicated: Secondary | ICD-10-CM | POA: Insufficient documentation

## 2018-11-03 DIAGNOSIS — I10 Essential (primary) hypertension: Secondary | ICD-10-CM | POA: Insufficient documentation

## 2018-11-03 LAB — CBC
HCT: 36.2 % (ref 36.0–46.0)
Hemoglobin: 12.2 g/dL (ref 12.0–15.0)
MCH: 26.9 pg (ref 26.0–34.0)
MCHC: 33.7 g/dL (ref 30.0–36.0)
MCV: 79.9 fL — ABNORMAL LOW (ref 80.0–100.0)
Platelets: 360 10*3/uL (ref 150–400)
RBC: 4.53 MIL/uL (ref 3.87–5.11)
RDW: 14.9 % (ref 11.5–15.5)
WBC: 12.2 10*3/uL — ABNORMAL HIGH (ref 4.0–10.5)
nRBC: 0 % (ref 0.0–0.2)

## 2018-11-03 LAB — BASIC METABOLIC PANEL
Anion gap: 11 (ref 5–15)
BUN: 6 mg/dL (ref 6–20)
CO2: 25 mmol/L (ref 22–32)
Calcium: 9 mg/dL (ref 8.9–10.3)
Chloride: 102 mmol/L (ref 98–111)
Creatinine, Ser: 0.57 mg/dL (ref 0.44–1.00)
GFR calc Af Amer: >60 mL/min (ref >60)
GFR calc non Af Amer: >60 mL/min (ref >60)
Glucose, Bld: 99 mg/dL (ref 70–99)
Potassium: 3.2 mmol/L — ABNORMAL LOW (ref 3.5–5.1)
Sodium: 138 mmol/L (ref 135–145)

## 2018-11-03 LAB — SARS CORONAVIRUS 2 BY RT PCR (HOSPITAL ORDER, PERFORMED IN ~~LOC~~ HOSPITAL LAB): SARS Coronavirus 2: NEGATIVE

## 2018-11-03 LAB — TROPONIN I: Troponin I: <0.03 ng/mL (ref <0.03)

## 2018-11-03 LAB — GROUP A STREP BY PCR: Group A Strep by PCR: NOT DETECTED

## 2018-11-03 MED ORDER — GUAIFENESIN-CODEINE 100-10 MG/5ML PO SOLN: 5.0000 mL | Freq: Four times a day (QID) | ORAL | 0 refills | Status: DC | PRN

## 2018-11-03 MED ORDER — SODIUM CHLORIDE 0.9% FLUSH: 3.0000 mL | Freq: Once | INTRAVENOUS | Status: DC

## 2018-11-03 NOTE — ED Notes (Signed)
No peripheral IV placed this visit.   Discharge instructions reviewed with patient. Questions fielded by this RN. Patient verbalizes understanding of instructions. Patient discharged home in stable condition per paduchowski. No acute distress noted at time of discharge.

## 2018-11-03 NOTE — ED Triage Notes (Signed)
Pt via pov from home with sore throat, cough, chest pain x 4 days. Pt denies any other household members being sick or contact with known covid patients. Pt alert & oriented with NAD noted.

## 2018-11-03 NOTE — ED Provider Notes (Signed)
[facility] Emergency Department Provider Note  Time seen: 7:57 PM  I have reviewed the triage vital signs and the nursing notes.   HISTORY  Chief Complaint Sore Throat; Cough; and Chest Pain   HPI Yesenia Carrillo is a 23 y.o. female with a past medical history of hypertension presents to the emergency department for sore throat subjective fever/chills and cough.  According to the patient  for the past 3 to 4 days she has had sore throat, intermittent chest discomfort and shortness of breath with occasional cough.  States her main symptom today is a sore throat.  Denies measuring her temperature, states she checked it at home and it was 97 but has felt chills.  Denies any vomiting or diarrhea.  Past Medical History:  Diagnosis Date  . Hypertension     Patient Active Problem List   Diagnosis Date Noted  . Acute respiratory failure with hypoxia ([place])   . CAP (community acquired pneumonia) 04/30/2018    Past Surgical History:  Procedure Laterality Date  . TONSILLECTOMY    . WISDOM TOOTH EXTRACTION      Prior to Admission medications   Medication Sig Start Date End Date Taking? Authorizing Provider  acetaminophen (TYLENOL) 650 MG suppository Place 1 suppository (650 mg total) rectally every 6 (six) hours as needed for mild pain (or Fever >/= 101). 05/03/18   Cassandria Santee, MD  ceFEPIme 2 g in sodium chloride 0.9 % 100 mL Inject 2 g into the vein every 12 (twelve) hours. 05/03/18   Cassandria Santee, MD  cephALEXin (KEFLEX) 500 MG capsule Take 1 capsule (500 mg total) by mouth 3 (three) times daily. 05/20/18   Versie Starks, PA-C  cisatracurium 200 mg in sodium chloride 0.9 % 180 mL Inject 368.7-1,229 mcg/min into the vein continuous. 05/03/18   Cassandria Santee, MD  famotidine (PEPCID) 20-0.9 MG/50ML-% Inject 50 mLs (20 mg total) into the vein every 12 (twelve) hours. 05/03/18   Cassandria Santee, MD  fentaNYL 10 mcg/ml SOLN infusion Inject 100-300 mcg/hr into the vein  continuous. 05/03/18   Cassandria Santee, MD  heparin 5000 UNIT/ML injection Inject 1 mL (5,000 Units total) into the skin every 8 (eight) hours. 05/03/18   Cassandria Santee, MD  hydrocortisone sodium succinate (SOLU-CORTEF) 100 MG SOLR injection Inject 1 mL (50 mg total) into the vein every 6 (six) hours. 05/03/18   Samaan, Maged, MD  ipratropium-albuterol (DUONEB) 0.5-2.5 (3) MG/3ML SOLN Take 3 mLs by nebulization every 4 (four) hours as needed. 05/03/18   Cassandria Santee, MD  midazolam (VERSED) 2 MG/2ML SOLN injection Inject 2 mLs (2 mg total) into the vein every 15 (fifteen) minutes as needed (to acheive RASS goal.). 05/03/18   Cassandria Santee, MD  norepinephrine (LEVOPHED) 16-5 MG/250ML-% SOLN Inject 0-40 mcg/min into the vein continuous. 05/03/18   Cassandria Santee, MD  nystatin ointment (MYCOSTATIN) Apply 1 application topically 2 (two) times daily. 05/22/18   Laban Emperor, PA-C  propofol (DIPRIVAN) 1000 MG/100ML EMUL injection Inject 3,072.5-9,832 mcg/min into the vein continuous. 05/03/18   Cassandria Santee, MD  sulfamethoxazole-trimethoprim (BACTRIM DS,SEPTRA DS) 800-160 MG tablet Take 1 tablet by mouth 2 (two) times daily. 05/20/18   Versie Starks, PA-C  vasopressin 40 Units in sodium chloride 0.9 % 250 mL Inject 0.03 Units/min into the vein continuous. 05/03/18   Cassandria Santee, MD    Allergies  Allergen Reactions  . Ampicillin Rash and Other (See Comments)    Patient and family cannot remember the specifics of  the reaction.  Marland Kitchen Penicillins Hives    No family history on file.  Social History Social History   Tobacco Use  . Smoking status: Current Every Day Smoker    Types: Cigars  . Smokeless tobacco: Never Used  Substance Use Topics  . Alcohol use: Yes    Comment: monthly  . Drug use: No    Review of Systems Constitutional: Chills at home ENT: Mild congestion.  Positive for sore throat. Cardiovascular: Negative for chest pain. Respiratory: Negative for shortness of breath.   Occasional cough. Gastrointestinal: Negative for abdominal pain, vomiting Musculoskeletal: Negative for musculoskeletal complaints Neurological: Negative for headache All other ROS negative  ____________________________________________   PHYSICAL EXAM:  VITAL SIGNS: ED Triage Vitals  Enc Vitals Group     BP 11/03/18 1741 (!) 151/86     Pulse Rate 11/03/18 1741 (!) 113     Resp 11/03/18 1741 20     Temp 11/03/18 1741 98.3 F (36.8 C)     Temp src --      SpO2 11/03/18 1741 96 %     Weight 11/03/18 1742 273 lb 5.9 oz (124 kg)     Height 11/03/18 1742 5\' 1"  (1.549 m)     Head Circumference --      Peak Flow --      Pain Score 11/03/18 1742 7     Pain Loc --      Pain Edu? --      Excl. in [place]? --    Constitutional: Alert and oriented. Well appearing and in no distress. Eyes: Normal exam ENT      Head: Normocephalic and atraumatic      Mouth/Throat: Mucous membranes are moist. Cardiovascular: Normal rate, regular rhythm. No murmur Respiratory: Normal respiratory effort without tachypnea nor retractions. Breath sounds are clear  Gastrointestinal: Soft and nontender. No distention.  Musculoskeletal: Nontender with normal range of motion in all extremities. No lower extremity tenderness or edema. Neurologic:  Normal speech and language. No gross focal neurologic deficits are appreciated. Skin:  Skin is warm, dry and intact.  Psychiatric: Mood and affect are normal.   ____________________________________________    EKG  EKG viewed and interpreted by myself shows sinus tachycardia 113 bpm with a narrow QRS, normal axis, normal intervals nonspecific ST changes.  ____________________________________________    RADIOLOGY  Chest x-ray is negative  ____________________________________________   INITIAL IMPRESSION / ASSESSMENT AND PLAN / ED COURSE  Pertinent labs & imaging results that were available during my care of the patient were reviewed by me and considered in my  medical decision making (see chart for details).   Patient presents emergency department for sore throat chills body aches.  Differential would include viral illness, pharyngitis, streptococcal pharyngitis.  We will check labs, strep throat swab, corona swab and a chest x-ray.  Patient's labs are resulted largely within normal limits.  Slight white blood cell count elevation.  Negative troponin.  strep swab is negative.  Corona swab is negative.  Chest x-ray is reassuring.  Possible viral syndrome/pharyngitis.  We will discharge patient with PCP follow-up and supportive care at home.  Nayomi L Handy was evaluated in Emergency Department on 11/03/2018 for the symptoms described in the history of present illness. She was evaluated in the context of the global COVID-19 pandemic, which necessitated consideration that the patient might be at risk for infection with the SARS-CoV-2 virus that causes COVID-19. Institutional protocols and algorithms that pertain to the evaluation of patients at risk for  COVID-19 are in a state of rapid change based on information released by regulatory bodies including the CDC and federal and state organizations. These policies and algorithms were followed during the patient's care in the ED.  ____________________________________________   FINAL CLINICAL IMPRESSION(S) / ED DIAGNOSES  Pharyngitis   Harvest Dark, MD 11/03/18 [phone]

## 2018-11-05 ENCOUNTER — Emergency Department: Payer: Self-pay

## 2018-11-05 ENCOUNTER — Inpatient Hospital Stay
Admission: EM | Admit: 2018-11-05 | Discharge: 2018-11-07 | DRG: 871 | Disposition: A | Discharge status: Other Acute Inpatient Hospital | Payer: Self-pay | Attending: Internal Medicine | Admitting: Internal Medicine

## 2018-11-05 ENCOUNTER — Other Ambulatory Visit: Payer: Self-pay

## 2018-11-05 ENCOUNTER — Inpatient Hospital Stay: Payer: Self-pay

## 2018-11-05 DIAGNOSIS — Z20828 Contact with and (suspected) exposure to other viral communicable diseases: Secondary | ICD-10-CM | POA: Diagnosis present

## 2018-11-05 DIAGNOSIS — J9602 Acute respiratory failure with hypercapnia: Secondary | ICD-10-CM | POA: Diagnosis present

## 2018-11-05 DIAGNOSIS — Z6841 Body Mass Index (BMI) 40.0 and over, adult: Secondary | ICD-10-CM

## 2018-11-05 DIAGNOSIS — R1011 Right upper quadrant pain: Secondary | ICD-10-CM

## 2018-11-05 DIAGNOSIS — Z880 Allergy status to penicillin: Secondary | ICD-10-CM

## 2018-11-05 DIAGNOSIS — Z79899 Other long term (current) drug therapy: Secondary | ICD-10-CM

## 2018-11-05 DIAGNOSIS — A419 Sepsis, unspecified organism: Principal | ICD-10-CM | POA: Diagnosis present

## 2018-11-05 DIAGNOSIS — J9601 Acute respiratory failure with hypoxia: Secondary | ICD-10-CM | POA: Diagnosis present

## 2018-11-05 DIAGNOSIS — J189 Pneumonia, unspecified organism: Secondary | ICD-10-CM | POA: Diagnosis present

## 2018-11-05 DIAGNOSIS — F17290 Nicotine dependence, other tobacco product, uncomplicated: Secondary | ICD-10-CM | POA: Diagnosis present

## 2018-11-05 DIAGNOSIS — R079 Chest pain, unspecified: Secondary | ICD-10-CM

## 2018-11-05 DIAGNOSIS — Z8701 Personal history of pneumonia (recurrent): Secondary | ICD-10-CM

## 2018-11-05 DIAGNOSIS — T82594A Other mechanical complication of infusion catheter, initial encounter: Secondary | ICD-10-CM

## 2018-11-05 DIAGNOSIS — N390 Urinary tract infection, site not specified: Secondary | ICD-10-CM | POA: Diagnosis present

## 2018-11-05 DIAGNOSIS — M549 Dorsalgia, unspecified: Secondary | ICD-10-CM | POA: Diagnosis present

## 2018-11-05 DIAGNOSIS — Z0189 Encounter for other specified special examinations: Secondary | ICD-10-CM

## 2018-11-05 DIAGNOSIS — R0602 Shortness of breath: Secondary | ICD-10-CM

## 2018-11-05 DIAGNOSIS — I10 Essential (primary) hypertension: Secondary | ICD-10-CM | POA: Diagnosis present

## 2018-11-05 DIAGNOSIS — Z86711 Personal history of pulmonary embolism: Secondary | ICD-10-CM

## 2018-11-05 DIAGNOSIS — Z978 Presence of other specified devices: Secondary | ICD-10-CM

## 2018-11-05 DIAGNOSIS — G8929 Other chronic pain: Secondary | ICD-10-CM | POA: Diagnosis present

## 2018-11-05 LAB — COMPREHENSIVE METABOLIC PANEL
ALT: 59 U/L — ABNORMAL HIGH (ref 0–44)
AST: 73 U/L — ABNORMAL HIGH (ref 15–41)
Albumin: 3.7 g/dL (ref 3.5–5.0)
Alkaline Phosphatase: 55 U/L (ref 38–126)
Anion gap: 9 (ref 5–15)
BUN: 11 mg/dL (ref 6–20)
CO2: 26 mmol/L (ref 22–32)
Calcium: 8.9 mg/dL (ref 8.9–10.3)
Chloride: 102 mmol/L (ref 98–111)
Creatinine, Ser: 0.69 mg/dL (ref 0.44–1.00)
GFR calc Af Amer: >60 mL/min (ref >60)
GFR calc non Af Amer: >60 mL/min (ref >60)
Glucose, Bld: 103 mg/dL — ABNORMAL HIGH (ref 70–99)
Potassium: 3.7 mmol/L (ref 3.5–5.1)
Sodium: 137 mmol/L (ref 135–145)
Total Bilirubin: 0.8 mg/dL (ref 0.3–1.2)
Total Protein: 7.7 g/dL (ref 6.5–8.1)

## 2018-11-05 LAB — BLOOD GAS, VENOUS
Acid-Base Excess: 1.9 mmol/L (ref 0.0–2.0)
Bicarbonate: 26.6 mmol/L (ref 20.0–28.0)
O2 Saturation: 78.5 %
Patient temperature: 37
pCO2, Ven: 41 mmHg — ABNORMAL LOW (ref 44.0–60.0)
pH, Ven: 7.42 (ref 7.250–7.430)
pO2, Ven: 42 mmHg (ref 32.0–45.0)

## 2018-11-05 LAB — RESPIRATORY PANEL BY PCR

## 2018-11-05 LAB — CBC WITH DIFFERENTIAL/PLATELET
Abs Immature Granulocytes: 0.06 10*3/uL (ref 0.00–0.07)
Basophils Absolute: 0 10*3/uL (ref 0.0–0.1)
Basophils Relative: 0 %
Eosinophils Absolute: 0.2 10*3/uL (ref 0.0–0.5)
Eosinophils Relative: 1 %
HCT: 35.9 % — ABNORMAL LOW (ref 36.0–46.0)
Hemoglobin: 12 g/dL (ref 12.0–15.0)
Immature Granulocytes: 0 %
Lymphocytes Relative: 12 %
Lymphs Abs: 1.8 10*3/uL (ref 0.7–4.0)
MCH: 27.5 pg (ref 26.0–34.0)
MCHC: 33.4 g/dL (ref 30.0–36.0)
MCV: 82.2 fL (ref 80.0–100.0)
Monocytes Absolute: 1 10*3/uL (ref 0.1–1.0)
Monocytes Relative: 6 %
Neutro Abs: 12.6 10*3/uL — ABNORMAL HIGH (ref 1.7–7.7)
Neutrophils Relative %: 81 %
Platelets: 351 10*3/uL (ref 150–400)
RBC: 4.37 MIL/uL (ref 3.87–5.11)
RDW: 15.5 % (ref 11.5–15.5)
WBC: 15.7 10*3/uL — ABNORMAL HIGH (ref 4.0–10.5)
nRBC: 0 % (ref 0.0–0.2)

## 2018-11-05 LAB — URINALYSIS, ROUTINE W REFLEX MICROSCOPIC
Bilirubin Urine: NEGATIVE
Glucose, UA: NEGATIVE mg/dL
Hgb urine dipstick: NEGATIVE
Ketones, ur: NEGATIVE mg/dL
Nitrite: POSITIVE — AB
Protein, ur: NEGATIVE mg/dL
Specific Gravity, Urine: 1.012 (ref 1.005–1.030)
pH: 6 (ref 5.0–8.0)

## 2018-11-05 LAB — TROPONIN I: Troponin I: <0.03 ng/mL (ref <0.03)

## 2018-11-05 LAB — BRAIN NATRIURETIC PEPTIDE: B Natriuretic Peptide: 74 pg/mL (ref 0.0–100.0)

## 2018-11-05 LAB — LACTIC ACID, PLASMA: Lactic Acid, Venous: 1.7 mmol/L (ref 0.5–1.9)

## 2018-11-05 LAB — SARS CORONAVIRUS 2 BY RT PCR (HOSPITAL ORDER, PERFORMED IN ~~LOC~~ HOSPITAL LAB): SARS Coronavirus 2: NEGATIVE

## 2018-11-05 LAB — PROCALCITONIN: Procalcitonin: <0.1 ng/mL

## 2018-11-05 MED ORDER — VANCOMYCIN HCL 10 G IV SOLR
1250.0000 mg | INTRAVENOUS | Status: DC
  Administered 2018-11-06: 1250 mg via INTRAVENOUS
  Filled 2018-11-05 (×2): qty 1250

## 2018-11-05 MED ORDER — SODIUM CHLORIDE 0.9 % IV SOLN
2.0000 g | Freq: Once | INTRAVENOUS | Status: AC
  Administered 2018-11-05: 2 g via INTRAVENOUS
  Filled 2018-11-05: qty 2

## 2018-11-05 MED ORDER — VANCOMYCIN HCL 10 G IV SOLR
2000.0000 mg | Freq: Once | INTRAVENOUS | Status: AC
  Administered 2018-11-05: 2000 mg via INTRAVENOUS
  Filled 2018-11-05: qty 2000

## 2018-11-05 MED ORDER — SODIUM CHLORIDE 0.9 % IV BOLUS
1000.0000 mL | Freq: Once | INTRAVENOUS | Status: AC
  Administered 2018-11-05: 1000 mL via INTRAVENOUS

## 2018-11-05 MED ORDER — SODIUM CHLORIDE 0.9 % IV SOLN
2.0000 g | Freq: Three times a day (TID) | INTRAVENOUS | Status: DC
  Administered 2018-11-05 – 2018-11-07 (×5): 2 g via INTRAVENOUS
  Filled 2018-11-05 (×7): qty 2

## 2018-11-05 MED ORDER — ENOXAPARIN SODIUM 40 MG/0.4ML ~~LOC~~ SOLN
40.0000 mg | SUBCUTANEOUS | Status: DC
  Administered 2018-11-05 – 2018-11-06 (×2): 40 mg via SUBCUTANEOUS
  Filled 2018-11-05 (×2): qty 0.4

## 2018-11-05 MED ORDER — ACETAMINOPHEN 500 MG PO TABS
1000.0000 mg | ORAL_TABLET | Freq: Once | ORAL | Status: AC
  Administered 2018-11-05: 1000 mg via ORAL
  Filled 2018-11-05: qty 2

## 2018-11-05 MED ORDER — IOHEXOL 350 MG/ML SOLN
75.0000 mL | Freq: Once | INTRAVENOUS | Status: AC | PRN
  Administered 2018-11-05: 75 mL via INTRAVENOUS

## 2018-11-05 MED ORDER — SODIUM CHLORIDE 0.9 % IV SOLN
INTRAVENOUS | Status: DC
  Administered 2018-11-05 – 2018-11-07 (×4): via INTRAVENOUS

## 2018-11-05 NOTE — Progress Notes (Signed)
Pharmacy Antibiotic Note  Yesenia Carrillo is a 23 y.o. female admitted on 11/05/2018 with sepsis.  Pharmacy has been consulted for Vancomycin and Cefepime dosing in ED.  Plan: Will order Vancomycin 2000 mg IV once as a loading dose. Will order Cefepime 2g IV once. Will await admission plan for further dosing.   Height: 5\' 1"  (154.9 cm) Weight: 220 lb (99.8 kg) IBW/kg (Calculated) : 47.8  Temp (24hrs), Avg:102.9 F (39.4 C), Min:102.9 F (39.4 C), Max:102.9 F (39.4 C)  Recent Labs  Lab 11/03/18 1746 11/05/18 1210 11/05/18 1220  WBC 12.2* 15.7*  --   CREATININE 0.57 0.69  --   LATICACIDVEN  --   --  1.7    Estimated Creatinine Clearance: 119.5 mL/min (by C-G formula based on SCr of 0.69 mg/dL).    Allergies  Allergen Reactions  . Ampicillin Rash and Other (See Comments)    Patient and family cannot remember the specifics of the reaction.  Marland Kitchen Penicillins Hives    Thank you for allowing pharmacy to be a part of this patient's care.  Paulina Fusi, PharmD, BCPS 11/05/2018 1:30 PM

## 2018-11-05 NOTE — ED Notes (Signed)
Awaiting U/S . Second lactate d/c as per Md. Vss. Awaiting bed status.

## 2018-11-05 NOTE — Consult Note (Signed)
CODE SEPSIS - PHARMACY COMMUNICATION  **Broad Spectrum Antibiotics should be administered within 1 hour of Sepsis diagnosis**  Time Code Sepsis Called/Page Received: 1218  Antibiotics Ordered: vancomycin and cefepime  Time of 1st antibiotic administration: 1248  Additional action taken by pharmacy: none required  If necessary, Name of Provider/Nurse Contacted: N/A   Dallie Piles ,PharmD Clinical Pharmacist  11/05/2018  1:19 PM

## 2018-11-05 NOTE — ED Notes (Signed)
Pt placed on 3L [place] in the room sats >90%

## 2018-11-05 NOTE — ED Notes (Signed)
Patient reports feeling body aches and severe headache, seen in the ed for same symptoms and was discharged to home. Returns with worsening symptoms, reports headache worse and fever / body aches not resolved with OTC.

## 2018-11-05 NOTE — ED Triage Notes (Signed)
Pt c/o cough with body aches sore throat that started Thursday was seen here on Sunday -covid  Pt c/op worsening cough with a fever today. Pt is tachypneic and pale on arrival.

## 2018-11-05 NOTE — ED Provider Notes (Signed)
[facility] Emergency Department Provider Note  ____________________________________________   First MD Initiated Contact with Patient 11/05/18 1158     (approximate)  I have reviewed the triage vital signs and the nursing notes.   HISTORY  Chief Complaint Shortness of Breath    HPI Yesenia Carrillo is a 23 y.o. 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  The patient was just seen on 5/31.  She was diagnosed with likely viral pharyngitis and sent home.  She states she initially felt okay, but over the last 24 hours, is developed worsening cough, shortness of breath, general fatigue, body aches, and fatigue.  She is had some lightheadedness with standing.  She had poor p.o. intake and intermittent vomiting.  She feels like her urine is more concentrated as well.  No overt flank pain.  No other medical complaints.  No antipyretics taken today.  She feels mildly short of breath with exertion, but not at rest.  No specific sick contacts.       Past Medical History:  Diagnosis Date   Hypertension     Patient Active Problem List   Diagnosis Date Noted   Sepsis ([place]) 11/05/2018   Acute respiratory failure with hypoxia (HCC)    CAP (community acquired pneumonia) 04/30/2018    Past Surgical History:  Procedure Laterality Date   TONSILLECTOMY     WISDOM TOOTH EXTRACTION      Prior to Admission medications   Medication Sig Start Date End Date Taking? Authorizing Provider  guaiFENesin-codeine 100-10 MG/5ML syrup Take 5 mLs by mouth every 6 (six) hours as needed for cough. 11/03/18  Yes Harvest Dark, MD  hydrochlorothiazide (MICROZIDE) 12.5 MG capsule Take 12.5 mg by mouth daily.   Yes [provider]    Allergies Ampicillin and Penicillins  No family history on file.  Social History Social  History   Tobacco Use   Smoking status: Current Every Day Smoker    Types: Cigars   Smokeless tobacco: Never Used  Substance Use Topics   Alcohol use: Yes    Comment: monthly   Drug use: No    Review of Systems Constitutional: Positive fevers, chills  Eyes: No visual changes. ENT: No sore throat. Cardiovascular: Denies chest pain. Respiratory: Positive cough, shortness of breath, sputum production  Gastrointestinal: Positive nausea. No abdominal pain. No vomiting.  No diarrhea.  No constipation. Genitourinary: Negative for dysuria. Musculoskeletal: Negative for neck pain.  Negative for back pain. Integumentary: Negative for rash. Neurological: Negative for headaches, focal weakness or numbness. ____________________________________________   PHYSICAL EXAM:  VITAL SIGNS: ED Triage Vitals  Enc Vitals Group     BP 11/05/18 1146 (!) 143/93     Pulse Rate 11/05/18 1146 (!) 147     Resp 11/05/18 1146 (!) 26     Temp 11/05/18 1146 (!) 102.9 F (39.4 C)     Temp Source 11/05/18 1146 Oral     SpO2 11/05/18 1146 (!) 85 %     Weight 11/05/18 1147 220 lb (99.8 kg)     Height 11/05/18 1147 5\' 1"  (1.549 m)     Head Circumference --      Peak Flow --      Pain Score 11/05/18 1147 10     Pain Loc --      Pain Edu? --      Excl. in [place]? --  Constitutional: Alert and oriented. Well appearing and in no acute distress. Eyes: Conjunctivae are normal.  Head: Atraumatic. Nose: No congestion/rhinnorhea. Mouth/Throat: Mucous membranes are moist. Moderate posterior pharyngeal erythema w/o exudates. Neck: No stridor.  No meningeal signs.   Cardiovascular: tachycardia, regular rhythm. Good peripheral circulation. Grossly normal heart sounds. Respiratory: Tachypneic, diffuse rhonchi in bilateral bases.  Increased work of breathing.   Gastrointestinal: Soft and nontender. No distention.  Musculoskeletal: No lower extremity tenderness nor edema. No gross deformities of  extremities. Neurologic:  Normal speech and language. No gross focal neurologic deficits are appreciated.  Skin:  Skin is warm, dry and intact. No rash noted.   ____________________________________________   LABS (all labs ordered are listed, but only abnormal results are displayed)  Labs Reviewed  CBC WITH DIFFERENTIAL/PLATELET - Abnormal; Notable for the following components:      Result Value   WBC 15.7 (*)    HCT 35.9 (*)    Neutro Abs 12.6 (*)    All other components within normal limits  COMPREHENSIVE METABOLIC PANEL - Abnormal; Notable for the following components:   Glucose, Bld 103 (*)    AST 73 (*)    ALT 59 (*)    All other components within normal limits  URINALYSIS, ROUTINE W REFLEX MICROSCOPIC - Abnormal; Notable for the following components:   Color, Urine YELLOW (*)    APPearance HAZY (*)    Nitrite POSITIVE (*)    Leukocytes,Ua SMALL (*)    Bacteria, UA RARE (*)    All other components within normal limits  BLOOD GAS, VENOUS - Abnormal; Notable for the following components:   pCO2, Ven 41 (*)    All other components within normal limits  SARS CORONAVIRUS 2 (HOSPITAL ORDER, [place] [facility])  CULTURE, BLOOD (ROUTINE X 2)  CULTURE, BLOOD (ROUTINE X 2)  URINE CULTURE  RESPIRATORY PANEL BY PCR  LACTIC ACID, PLASMA  BRAIN NATRIURETIC PEPTIDE  TROPONIN I  PROCALCITONIN  HIV ANTIBODY (ROUTINE TESTING W REFLEX)  PROCALCITONIN  PROTIME-INR  CORTISOL-AM, BLOOD   ____________________________________________  EKG   ____________________________________________  RADIOLOGY All imaging, including plain films, CT scans, and ultrasounds, independently reviewed by me, and interpretations confirmed via formal radiology reads.  ED MD interpretation: Bibasilar infiltrates concerning for possible pneumonia.  Low lung volumes.  Official radiology report(s): Dg Chest 1 View  Result Date: 11/05/2018 CLINICAL DATA:  Body aches. EXAM: CHEST  1  VIEW COMPARISON:  11/03/2018. FINDINGS: Mild mediastinal prominence again noted. This may be secondary to AP portable technique. To exclude persistent mediastinal prominence a PA and lateral chest x-ray suggested. Low lung volumes with bibasilar atelectasis/infiltrates. No pleural effusion or pneumothorax. Borderline cardiomegaly. IMPRESSION: 1. Mild mediastinal prominence again noted. This may be secondary to AP portable technique. To exclude persistent mediastinal prominence a PA lateral chest x-ray suggested. 2.  Low lung volumes with mild bibasilar atelectasis/infiltrates. Electronically Signed   By: Marcello Moores  Register   On: 11/05/2018 12:24   Dg Chest 2 View  Result Date: 11/05/2018 CLINICAL DATA:  Mediastinal evaluation requested. History of hypertension. Smoking. EXAM: CHEST - 2 VIEW COMPARISON:  11/05/2018. FINDINGS: Persistent mediastinal fullness noted on both AP and lateral view. Mediastinal fullness could be secondary to AP projection and or related to prominent great vessels and or prominent mediastinal fat. Given the fullness on the lateral view it may be wise to obtain contrast-enhanced chest CT for further evaluation to exclude mediastinal mass. Cardiomegaly. No pulmonary venous congestion. No focal infiltrate. No  pleural effusion or pneumothorax. No acute bony abnormality. Mild thoracic spine scoliosis concave left. IMPRESSION: 1. Persistent upper mediastinal fullness as described above. Although mediastinal widening may be related to AP projection and or to prominent great vessels and or prominent mediastinal fat, given the mediastinal fullness on lateral view it may be wise to obtain contrast-enhanced chest CT for further evaluation to exclude mediastinal mass. 2.  Stable cardiomegaly. Electronically Signed   By: Marcello Moores  Register   On: 11/05/2018 16:13   Ct Angio Chest Pe W Or Wo Contrast  Result Date: 11/05/2018 CLINICAL DATA:  Shortness of breath and chest pain EXAM: CT ANGIOGRAPHY CHEST  WITH CONTRAST TECHNIQUE: Multidetector CT imaging of the chest was performed using the standard protocol during bolus administration of intravenous contrast. Multiplanar CT image reconstructions and MIPs were obtained to evaluate the vascular anatomy. CONTRAST:  21mL OMNIPAQUE IOHEXOL 350 MG/ML SOLN COMPARISON:  Chest CT May 02, 2018; chest radiograph November 05, 2018 FINDINGS: Cardiovascular: There is no demonstrable pulmonary embolus. There is no thoracic aortic aneurysm or dissection. Visualized great vessels appear unremarkable. There is no appreciable pericardial effusion or pericardial thickening. Mediastinum/Nodes: Visualized thyroid appears normal. There are lymph nodes in the aortopulmonary window region, largest measuring 2.1 by 1.3 cm. There is a lymph node to the left of the carina measuring 1.2 x 1.1 cm. There is a lymph node to the right of the distal trachea measuring 1.3 x 1.1 cm. There is a subcarinal lymph node measuring 1.1 x 1.1 cm. There is adenopathy inferior to the right hilum with the largest lymph node in this area measuring 1.6 x 1.5 cm. No esophageal lesions are evident. Lungs/Pleura: There is extensive airspace opacity throughout much of the left lung with involvement of multiple segments in the left upper lobe, lingula, and left lower lobe. There is patchy airspace opacity in portions of the lateral and posterior segments of the right lower lobe. There is also infiltrate is noted in the posterior segment of the right upper lobe. No pleural effusions are evident. Upper Abdomen: Visualized upper abdominal structures appear unremarkable. Musculoskeletal: There are no blastic or lytic bone lesions. No evident chest wall lesions. Review of the MIP images confirms the above findings. IMPRESSION: 1. No demonstrable pulmonary embolus. No thoracic aortic aneurysm or dissection. 2. Multifocal pneumonia, more extensive on the left than on the right. Note that atypical organism pneumonia including  viral type pneumonia may present in this manner. Hypersensitivity pneumonitis is a differential consideration in this circumstance. 3. Multiple enlarged lymph nodes noted. Question reactive etiology given the extensive pneumonia. Note that there was adenopathy as well as multifocal pneumonia on most recent CT chest examination from November 2019. Electronically Signed   By: Lowella Grip III M.D.   On: 11/05/2018 17:17   US Abdomen Limited Ruq  Result Date: 11/05/2018 CLINICAL DATA:  Right upper quadrant abdominal pain. No time course given. EXAM: ULTRASOUND ABDOMEN LIMITED RIGHT UPPER QUADRANT COMPARISON:  03/12/2018 FINDINGS: Gallbladder: No gallstones or wall thickening visualized. No sonographic Murphy sign noted by sonographer. Common bile duct: Diameter: 3.4 mm Liver: No focal lesion identified. Within normal limits in parenchymal echogenicity. Portal vein is patent on color Doppler imaging with normal direction of blood flow towards the liver. IMPRESSION: Unremarkable right upper quadrant ultrasound examination. Electronically Signed   By: Marijo Sanes M.D.   On: 11/05/2018 15:11    ____________________________________________   PROCEDURES   Procedure(s) performed (including Critical Care):  .Critical Care Performed by: Duffy Bruce, MD  Authorized by: Duffy Bruce, MD   Critical care provider statement:    Critical care time (minutes):  45   Critical care time was exclusive of:  Separately billable procedures and treating other patients and teaching time   Critical care was necessary to treat or prevent imminent or life-threatening deterioration of the following conditions:  Cardiac failure, circulatory failure, respiratory failure and sepsis   Critical care was time spent personally by me on the following activities:  Development of treatment plan with patient or surrogate, discussions with consultants, evaluation of patient's response to treatment, examination of patient,  obtaining history from patient or surrogate, ordering and performing treatments and interventions, ordering and review of laboratory studies, ordering and review of radiographic studies, pulse oximetry, re-evaluation of patient's condition and review of old charts   I assumed direction of critical care for this patient from another provider in my specialty: no       ____________________________________________   INITIAL IMPRESSION / MDM / [place] / ED COURSE  As part of my medical decision making, I reviewed the following data within the electronic MEDICAL RECORD NUMBER Notes from prior ED visits and [place] Controlled Substance Database      *Kenia L Villaflor was evaluated in Emergency Department on 11/05/2018 for the symptoms described in the history of present illness. She was evaluated in the context of the global COVID-19 pandemic, which necessitated consideration that the patient might be at risk for infection with the SARS-CoV-2 virus that causes COVID-19. Institutional protocols and algorithms that pertain to the evaluation of patients at risk for COVID-19 are in a state of rapid change based on information released by regulatory bodies including the CDC and federal and state organizations. These policies and algorithms were followed during the patient's care in the ED.  Some ED evaluations and interventions may be delayed as a result of limited staffing during the pandemic.*      Medical Decision Making: 23 yo F here with sepsis 2/2 suspected PNA. Pt arrives febrile, tachycardic, hypoxic to 85% on RA. CODE SEPSIS initiated with broad-spectrum ABX. IVF started but LA normal and will be cautious in setting of h/o ARDS, possible COVID infection. Suspect she likely has underlying CHF as well. Labs show leukocytosis. LA normal, CMP with mild AST/ALT elevation likely 2/2 FLD. Suspect PNA with b/l infiltrates on XR. UA also with ? UTI. Abdomen soft NT and ND. Will admit for sepsis 2/2  PNA. ____________________________________________  FINAL CLINICAL IMPRESSION(S) / ED DIAGNOSES  Final diagnoses:  RUQ pain  Sepsis due to pneumonia ([place])  Acute respiratory failure with hypoxia ([place])     MEDICATIONS GIVEN DURING THIS VISIT:  Medications  enoxaparin (LOVENOX) injection 40 mg (has no administration in time range)  0.9 %  sodium chloride infusion (has no administration in time range)  ceFEPIme (MAXIPIME) 2 g in sodium chloride 0.9 % 100 mL IVPB (has no administration in time range)  vancomycin (VANCOCIN) 1,250 mg in sodium chloride 0.9 % 250 mL IVPB (has no administration in time range)  acetaminophen (TYLENOL) tablet 1,000 mg (1,000 mg Oral Given 11/05/18 1252)  sodium chloride 0.9 % bolus 1,000 mL (0 mLs Intravenous Stopped 11/05/18 1346)  ceFEPIme (MAXIPIME) 2 g in sodium chloride 0.9 % 100 mL IVPB (0 g Intravenous Stopped 11/05/18 1346)  vancomycin (VANCOCIN) 2,000 mg in sodium chloride 0.9 % 500 mL IVPB (0 mg Intravenous Stopped 11/05/18 1602)  sodium chloride 0.9 % bolus 1,000 mL (1,000 mLs Intravenous New Bag/Given 11/05/18  1518)  iohexol (OMNIPAQUE) 350 MG/ML injection 75 mL (75 mLs Intravenous Contrast Given 11/05/18 1659)     ED Discharge Orders    None       Note:  This document was prepared using Dragon voice recognition software and may include unintentional dictation errors.   Duffy Bruce, MD 11/05/18 1730

## 2018-11-05 NOTE — Progress Notes (Signed)
Pharmacy Antibiotic Note  Yesenia Carrillo is a 23 y.o. female admitted on 11/05/2018 with sepsis.  Pharmacy has been consulted for Vancomycin and Cefepime dosing.  Plan: Patient received Vancomycin 2000mg  IV loading dose in the ED will follow with Vancomycin 1250 mg IV Q 18 hrs. Goal AUC 400-550. Expected AUC: 454.5 SCr used: 0.8 Expected Cmin: 10.2  Will order Cefepime 2g IV q8h   Height: 5\' 1"  (154.9 cm) Weight: 220 lb (99.8 kg) IBW/kg (Calculated) : 47.8  Temp (24hrs), Avg:100.3 F (37.9 C), Min:98.6 F (37 C), Max:102.9 F (39.4 C)  Recent Labs  Lab 11/03/18 1746 11/05/18 1210 11/05/18 1220  WBC 12.2* 15.7*  --   CREATININE 0.57 0.69  --   LATICACIDVEN  --   --  1.7    Estimated Creatinine Clearance: 119.5 mL/min (by C-G formula based on SCr of 0.69 mg/dL).    Allergies  Allergen Reactions  . Ampicillin Rash and Other (See Comments)    Patient and family cannot remember the specifics of the reaction.  Marland Kitchen Penicillins Hives    Antimicrobials this admission: Cefepime 6/2 >>  Vancomycin 6/2 >>   Thank you for allowing pharmacy to be a part of this patient's care.  Paulina Fusi, PharmD, BCPS 11/05/2018 4:32 PM

## 2018-11-05 NOTE — H&P (Signed)
[facility] NAME: Yesenia Carrillo    MR#:  [id number]  DATE OF BIRTH:  1996/01/29  DATE OF ADMISSION:  11/05/2018  PRIMARY CARE PHYSICIAN: [facility] [place]   REQUESTING/REFERRING PHYSICIAN:  CHIEF COMPLAINT:   Chief Complaint  Patient presents with  . Shortness of Breath    HISTORY OF PRESENT ILLNESS:  Yesenia Carrillo  is a 23 y.o. 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  Patient states that seen in the ED on 11/03/2018 for sore throat, cough, chest pain x4 days was treated and discharged.  However she continued to have symptoms that seem to be worse this morning.  She describes symptoms of d worsening myalgia, persistent cough, headache, and a fever of 101 this morning prompting him to come to the ED for further evaluation.  Of note: Patient was admitted on 04/30/18  for RUL pneumonia c/b worsening respiratory failure c/f ARDS. Pt was transferred to [facility] 11/29 due to instability from hypotension requiring epi/NE/vaso gtt. ABG at that time was  7.01/83/75/21, required 3 units pRBCs, 1L NS, 3 amp bicarb. Pt underwent bronch on 11/29. Pt underwent bronch on 11/29, negative eRVP and NGTD on BAL culture. Placed on VV ECMO cannulation on 24/09 complicated, requiring L femoral vein repair and cannulation revision in the OR. CT C/A/P with near complete opacifications b/l lungs and a 5cm L RP hematoma.  On arrival to the ED today , she was febrile with temperature of 102.9 F,   blood pressure 143/20m Hg and pulse rate 147 beats/min, SPO2 85 %, and respiration 26.  There were no focal neurological deficits; he was alert and oriented x4, and he did not demonstrate any respiratory distress.  She was placed on oxygen via nasal  cannula at 3 L improvement in her sats noted.  Labs revealed WBC 15.7, AST 73 ALT 59, procalcitonin <0.11 UA shows UTI, troponins <0.03.  Chest x-ray showspersistent upper mediastinal fullness unable to exclude mediastinal large.  Patient being admitted to hospitalist service for further work-up and management.  PAST MEDICAL HISTORY:   Past Medical History:  Diagnosis Date  . Hypertension    PAST SURGICAL HISTORY:   Past Surgical History:  Procedure Laterality Date  . TONSILLECTOMY    . WISDOM TOOTH EXTRACTION      SOCIAL HISTORY:   Social History   Tobacco Use  . Smoking status: Current Every Day Smoker    Types: Cigars  . Smokeless tobacco: Never Used  Substance Use Topics  . Alcohol use: Yes    Comment: monthly    FAMILY HISTORY:  No family history on file.  DRUG ALLERGIES:   Allergies  Allergen Reactions  . Ampicillin Rash and Other (See Comments)    Patient and family cannot remember the specifics of the reaction.  .Marland KitchenPenicillins Hives   REVIEW OF SYSTEMS:   Review of Systems  Constitutional: Positive for chills, fever and malaise/fatigue. Negative for weight loss.  HENT: Negative for congestion, hearing loss and sore throat.   Eyes: Negative for blurred vision and double vision.  Respiratory: Positive for cough and shortness of breath. Negative for wheezing.   Cardiovascular: Positive for chest pain. Negative for palpitations, orthopnea and leg swelling.  Gastrointestinal: Negative for abdominal pain, diarrhea, nausea and  vomiting.  Genitourinary: Negative for dysuria and urgency.  Musculoskeletal: Negative for myalgias.  Skin: Negative for rash.  Neurological: Negative for dizziness, sensory change, speech change, focal weakness and headaches.  Psychiatric/Behavioral: Negative for depression.   MEDICATIONS AT HOME:   Prior to Admission medications   Medication Sig Start Date End Date Taking? Authorizing Provider  guaiFENesin-codeine 100-10 MG/5ML syrup  Take 5 mLs by mouth every 6 (six) hours as needed for cough. 11/03/18  Yes Harvest Dark, MD  hydrochlorothiazide (MICROZIDE) 12.5 MG capsule Take 12.5 mg by mouth daily.   Yes [provider]      VITAL SIGNS:  Blood pressure 119/70, pulse (!) 105, temperature 98.6 F (37 C), resp. rate (!) 21, height _0  (1.549 m), weight 99.8 kg, last menstrual period 10/19/2018, SpO2 100 %.  PHYSICAL EXAMINATION:   Physical Exam  GENERAL:  23 y.o.-year-old patient lying in the bed with no acute distress.  EYES: Pupils equal, round, reactive to light and accommodation. No scleral icterus. Extraocular muscles intact.  HEENT: Head atraumatic, normocephalic. Oropharynx and nasopharynx clear.  NECK:  Supple, no jugular venous distention. No thyroid enlargement, no tenderness.  LUNGS: Normal breath sounds bilaterally, no wheezing, rales,rhonchi or crepitation. No use of accessory muscles of respiration.  CARDIOVASCULAR: S1, S2 normal. No murmurs, rubs, or gallops.  ABDOMEN: Soft, nontender, nondistended. Bowel sounds present. No organomegaly or mass.  EXTREMITIES: No pedal edema, cyanosis, or clubbing.  NEUROLOGIC: Mental Status:Alert, oriented, thought content appropriate.  Speech fluent without evidence of aphasia.  Able to follow 3 step commands without difficulty. Attention span and concentration seemed appropriate  Cranial Nerves: II: Discs flat bilaterally; Visual fields grossly normal, pupils equal, round, reactive to light and accommodation III,IV, VI: ptosis not present, extra-ocular motions intact bilaterally V,VII: smile symmetric, facial light touch sensation intact VIII: hearing normal bilaterally IX,X: gag reflex present XI: bilateral shoulder shrug XII: midline tongue extension Muscle strength 5/5 in all extremities.  Tone and bulk:normal tone throughout; no atrophy noted Sensory: Pinprick and light touch intact bilaterally Deep Tendon Reflexes: 2+ and symmetric throughout  Cerebellar:Absent ataxia Gait: not tested due to safety concerns PSYCHIATRIC: The patient is alert and oriented x 3.  SKIN: No obvious rash, lesion, or ulcer.   DATA REVIEWED:  LABORATORY PANEL:   CBC Recent Labs  Lab 11/05/18 1210  WBC 15.7*  HGB 12.0  HCT 35.9*  PLT 351   ------------------------------------------------------------------------------------------------------------------  Chemistries  Recent Labs  Lab 11/05/18 1210  NA 137  K 3.7  CL 102  CO2 26  GLUCOSE 103*  BUN 11  CREATININE 0.69  CALCIUM 8.9  AST 73*  ALT 59*  ALKPHOS 55  BILITOT 0.8   ------------------------------------------------------------------------------------------------------------------  Cardiac Enzymes Recent Labs  Lab 11/05/18 1220  TROPONINI <0.03   ------------------------------------------------------------------------------------------------------------------  RADIOLOGY:  Dg Chest 1 View  Result Date: 11/05/2018 CLINICAL DATA:  Body aches. EXAM: CHEST  1 VIEW COMPARISON:  11/03/2018. FINDINGS: Mild mediastinal prominence again noted. This may be secondary to AP portable technique. To exclude persistent mediastinal prominence a PA and lateral chest x-ray suggested. Low lung volumes with bibasilar atelectasis/infiltrates. No pleural effusion or pneumothorax. Borderline cardiomegaly. IMPRESSION: 1. Mild mediastinal prominence again noted. This may be secondary to AP portable technique. To exclude persistent mediastinal prominence a PA lateral chest x-ray suggested. 2.  Low lung volumes with mild bibasilar atelectasis/infiltrates. Electronically Signed   By: Marcello Moores  Register   On: 11/05/2018 12:24   Dg Chest 2 View  Result Date: 11/05/2018 CLINICAL  DATA:  Mediastinal evaluation requested. History of hypertension. Smoking. EXAM: CHEST - 2 VIEW COMPARISON:  11/05/2018. FINDINGS: Persistent mediastinal fullness noted on both AP and lateral view. Mediastinal fullness could be  secondary to AP projection and or related to prominent great vessels and or prominent mediastinal fat. Given the fullness on the lateral view it may be wise to obtain contrast-enhanced chest CT for further evaluation to exclude mediastinal mass. Cardiomegaly. No pulmonary venous congestion. No focal infiltrate. No pleural effusion or pneumothorax. No acute bony abnormality. Mild thoracic spine scoliosis concave left. IMPRESSION: 1. Persistent upper mediastinal fullness as described above. Although mediastinal widening may be related to AP projection and or to prominent great vessels and or prominent mediastinal fat, given the mediastinal fullness on lateral view it may be wise to obtain contrast-enhanced chest CT for further evaluation to exclude mediastinal mass. 2.  Stable cardiomegaly. Electronically Signed   By: Marcello Moores  Register   On: 11/05/2018 16:13   Dg Chest Port 1 View  Result Date: 11/03/2018 CLINICAL DATA:  Sore throat.  Cough and chest pain. EXAM: PORTABLE CHEST 1 VIEW COMPARISON:  May 20, 2018 FINDINGS: The heart, hila, and mediastinum are unchanged since at least April 2019. No pneumothorax. No nodules or masses. No focal infiltrates. Mild prominence of the mediastinum is stable. IMPRESSION: 1. No interval changes.  No focal infiltrate. 2. Mild prominence of the mediastinum could be due to mediastinal fat, adenopathy, or the low volume portable technique. Electronically Signed   By: Dorise Bullion III M.D   On: 11/03/2018 18:24   US Abdomen Limited Ruq  Result Date: 11/05/2018 CLINICAL DATA:  Right upper quadrant abdominal pain. No time course given. EXAM: ULTRASOUND ABDOMEN LIMITED RIGHT UPPER QUADRANT COMPARISON:  03/12/2018 FINDINGS: Gallbladder: No gallstones or wall thickening visualized. No sonographic Murphy sign noted by sonographer. Common bile duct: Diameter: 3.4 mm Liver: No focal lesion identified. Within normal limits in parenchymal echogenicity. Portal vein is patent on color  Doppler imaging with normal direction of blood flow towards the liver. IMPRESSION: Unremarkable right upper quadrant ultrasound examination. Electronically Signed   By: Marijo Sanes M.D.   On: 11/05/2018 15:11    EKG:  EKG: unchanged from previous tracings, sinus tachycardia.  IMPRESSION AND PLAN:   23 y.o. 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  1. Sepsis -likely secondary to pneumonia and UTI (patient presenting with fever 102.9, sats 85, tachycardia 147, tachypnea, and elevated WBC) - Prior hx of ARDS secondary to pneumonia requiring ECMO support - Admit to Medsurg Unit - SARS Coronavirus 2 negative - Ultrasound of right upper quadrant negative for acute finding - Chest x-ray shows persistent upper mediastinal fullness unable to exclude mediastinal large - We will obtain CT angios chest with and without contrast  - Start empiric antibiotics with cefepime and vancomycin  2.  Community-acquired pneumonia- presenting with fever, leukocytosis - Chest x-ray as above - Empiric antibiotics with cefepime and vancomycin as above - Blood cultures pending - Procalcitonin pending - IVFs  3. UTI - UA shows positive for UTI - Urine culture pending - Abx as above  4. HTN + Goal BP <140/90 - Hold HCTZ due to  Sepsis as above  5.  DVT prophylaxis -Lovenox   All the records are reviewed and case discussed with ED provider. Management  plans discussed with the patient, family and they are in agreement.  CODE STATUS: FULL  TOTAL TIME TAKING CARE OF THIS PATIENT: 45 minutes.    on 11/05/2018 abfnft 4:17 PM  This patient was staffed with Dr. Valetta Fuller, [facility] who personally evaluated patient, reviewed documentation and agreed with assessment and plan of care as above.  Rufina Falco, DNP, FNP-BC [facility] Nurse Practitioner Between 7am to 6pm - Pager [phone]  After 6pm go to [email or web address] - password [email or web address] [facility]  Office  [phone]  CC: Primary care physician; [facility] [place]

## 2018-11-05 NOTE — ED Notes (Signed)
ED TO INPATIENT HANDOFF REPORT  ED Nurse Name and Phone #:    S Name/Age/Gender Yesenia Carrillo 23 y.o. female Room/Bed: ED11A/ED11A  Code Status   Code Status: Full Code  Home/SNF/Other Home Patient oriented to: self, place, time and situation Is this baseline? Yes   Triage Complete: Triage complete  Chief Complaint  fever, vomiting  Triage Note Pt c/o cough with body aches sore throat that started Thursday was seen here on Sunday -covid  Pt c/op worsening cough with a fever today. Pt is tachypneic and pale on arrival.   Allergies Allergies  Allergen Reactions  . Ampicillin Rash and Other (See Comments)    Patient and family cannot remember the specifics of the reaction.  . Penicillins Hives    Level of Care/Admitting Diagnosis ED Disposition    ED Disposition Condition Comment   Admit  Hospital Area: [facility] [[id number]]  Level of Care: Med-Surg [16]  Covid Evaluation: Confirmed COVID Negative  Diagnosis: Sepsis (HCC) [[id number]]  Admitting Physician: OUMA, ELIZABETH ACHIENG [[id number]]  Attending Physician: MAYO, KATY DODD [[id number]]  Estimated length of stay: past midnight tomorrow  Certification:: I certify this patient will need inpatient services for at least 2 midnights  PT Class (Do Not Modify): Inpatient [101]  PT Acc Code (Do Not Modify): Private [1]       B Medical/Surgery History Past Medical History:  Diagnosis Date  . Hypertension    Past Surgical History:  Procedure Laterality Date  . TONSILLECTOMY    . WISDOM TOOTH EXTRACTION       A IV Location/Drains/Wounds Patient Lines/Drains/Airways Status   Active Line/Drains/Airways    Name:   Placement date:   Placement time:   Site:   Days:   Peripheral IV 11/05/18 Left Antecubital   11/05/18    1200    Antecubital   less than 1   Peripheral IV 11/05/18 Left Forearm   11/05/18    1304    Forearm   less than 1   CVC Triple Lumen 05/02/18 Left Femoral   05/02/18    2200      187   Urethral Catheter Kara Knight Double-lumen;Latex 14 Fr.   05/02/18    1730    Double-lumen;Latex   187   Airway 8 mm   05/02/18    1645     18 7          Intake/Output Last 24 hours  Intake/Output Summary (Last 24 hours) at 11/05/2018 1701 Last data filed at 11/05/2018 1602 Gross per 24 hour  Intake 1600 ml  Output -  Net 1600 ml    Labs/Imaging Results for orders placed or performed during the hospital encounter of 11/05/18 (from the past 48 hour(s))  CBC with Differential     Status: Abnormal   Collection Time: 11/05/18 12:10 PM  Result Value Ref Range   WBC 15.7 (H) 4.0 - 10.5 K/uL   RBC 4.37 3.87 - 5.11 MIL/uL   Hemoglobin 12.0 12.0 - 15.0 g/dL   HCT 35.9 (L) 36.0 - 46.0 %   MCV 82.2 80.0 - 100.0 fL   MCH 27.5 26.0 - 34.0 pg   MCHC 33.4 30.0 - 36.0 g/dL   RDW 15.5 11.5 - 15.5 %   Platelets 351 150 - 400 K/uL   nRBC 0.0 0.0 - 0.2 %   Neutrophils Relative % 81 %   Neutro Abs 12.6 (H) 1.7 - 7.7 K/uL   Lymphocytes Relative 12 %   Lymphs  Abs 1.8 0.7 - 4.0 K/uL   Monocytes Relative 6 %   Monocytes Absolute 1.0 0.1 - 1.0 K/uL   Eosinophils Relative 1 %   Eosinophils Absolute 0.2 0.0 - 0.5 K/uL   Basophils Relative 0 %   Basophils Absolute 0.0 0.0 - 0.1 K/uL   Immature Granulocytes 0 %   Abs Immature Granulocytes 0.06 0.00 - 0.07 K/uL    Comment: Performed at [facility], [address] [place], [place] [address]  Comprehensive metabolic panel     Status: Abnormal   Collection Time: 11/05/18 12:10 PM  Result Value Ref Range   Sodium 137 135 - 145 mmol/L   Potassium 3.7 3.5 - 5.1 mmol/L   Chloride 102 98 - 111 mmol/L   CO2 26 22 - 32 mmol/L   Glucose, Bld 103 (H) 70 - 99 mg/dL   BUN 11 6 - 20 mg/dL   Creatinine, Ser 0.69 0.44 - 1.00 mg/dL   Calcium 8.9 8.9 - 10.3 mg/dL   Total Protein 7.7 6.5 - 8.1 g/dL   Albumin 3.7 3.5 - 5.0 g/dL   AST 73 (H) 15 - 41 U/L   ALT 59 (H) 0 - 44 U/L   Alkaline Phosphatase 55 38 - 126 U/L   Total Bilirubin 0.8 0.3 -  1.2 mg/dL   GFR calc non Af Amer >60 >60 mL/min   GFR calc Af Amer >60 >60 mL/min   Anion gap 9 5 - 15    Comment: Performed at [facility], [address] [place], [place] [address]  Procalcitonin - Baseline     Status: None   Collection Time: 11/05/18 12:10 PM  Result Value Ref Range   Procalcitonin <0.10 ng/mL    Comment:        Interpretation: PCT (Procalcitonin) <= 0.5 ng/mL: Systemic infection (sepsis) is not likely. Local bacterial infection is possible. (NOTE)       Sepsis PCT Algorithm           Lower Respiratory Tract                                      Infection PCT Algorithm    ----------------------------     ----------------------------         PCT < 0.25 ng/mL                PCT < 0.10 ng/mL         Strongly encourage             Strongly discourage   discontinuation of antibiotics    initiation of antibiotics    ----------------------------     -----------------------------       PCT 0.25 - 0.50 ng/mL            PCT 0.10 - 0.25 ng/mL               OR       >80% decrease in PCT            Discourage initiation of                                            antibiotics      Encourage discontinuation           of antibiotics    ----------------------------     -----------------------------  PCT >= 0.50 ng/mL              PCT 0.26 - 0.50 ng/mL               AND        <80% decrease in PCT             Encourage initiation of                                             antibiotics       Encourage continuation           of antibiotics    ----------------------------     -----------------------------        PCT >= 0.50 ng/mL                  PCT > 0.50 ng/mL               AND         increase in PCT                  Strongly encourage                                      initiation of antibiotics    Strongly encourage escalation           of antibiotics                                     -----------------------------                                            PCT <= 0.25 ng/mL                                                 OR                                        > 80% decrease in PCT                                     Discontinue / Do not initiate                                             antibiotics Performed at [facility], [address] [place], [place] [address]   Urinalysis, Routine w reflex microscopic     Status: Abnormal   Collection Time: 11/05/18 12:19 PM  Result Value Ref Range   Color, Urine YELLOW (A) YELLOW   APPearance HAZY (A) CLEAR   Specific Gravity, Urine 1.012 1.005 - 1.030   pH 6.0 5.0 -  8.0   Glucose, UA NEGATIVE NEGATIVE mg/dL   Hgb urine dipstick NEGATIVE NEGATIVE   Bilirubin Urine NEGATIVE NEGATIVE   Ketones, ur NEGATIVE NEGATIVE mg/dL   Protein, ur NEGATIVE NEGATIVE mg/dL   Nitrite POSITIVE (A) NEGATIVE   Leukocytes,Ua SMALL (A) NEGATIVE   RBC / HPF 0-5 0 - 5 RBC/hpf   WBC, UA 21-50 0 - 5 WBC/hpf   Bacteria, UA RARE (A) NONE SEEN   Squamous Epithelial / LPF 0-5 0 - 5   Mucus PRESENT     Comment: Performed at [facility], [address], [place], [place] [address]  Brain natriuretic peptide     Status: None   Collection Time: 11/05/18 12:19 PM  Result Value Ref Range   B Natriuretic Peptide 74.0 0.0 - 100.0 pg/mL    Comment: Performed at [facility], [address] [place], [place] [address]  Lactic acid, plasma     Status: None   Collection Time: 11/05/18 12:20 PM  Result Value Ref Range   Lactic Acid, Venous 1.7 0.5 - 1.9 mmol/L    Comment: Performed at [facility], [address] [place], [place] [address]  Blood gas, venous ([facility], AP, [facility])     Status: Abnormal   Collection Time: 11/05/18 12:20 PM  Result Value Ref Range   pH, Ven 7.42 7.250 - 7.430   pCO2, Ven 41 (L) 44.0 - 60.0 mmHg   pO2, Ven 42.0 32.0 - 45.0 mmHg   Bicarbonate 26.6 20.0 - 28.0 mmol/L   Acid-Base Excess 1.9 0.0 - 2.0 mmol/L   O2 Saturation 78.5 %   Patient  temperature 37.0    Collection site VENOUS    Sample type VENOUS     Comment: Performed at [facility], [address] [place], [place] [address]  SARS Coronavirus 2 (CEPHEID- Performed in [facility] hospital lab), Hosp Order     Status: None   Collection Time: 11/05/18 12:20 PM  Result Value Ref Range   SARS Coronavirus 2 NEGATIVE NEGATIVE    Comment: (NOTE) If result is NEGATIVE SARS-CoV-2 target nucleic acids are NOT DETECTED. The SARS-CoV-2 RNA is generally detectable in upper and lower  respiratory specimens during the acute phase of infection. The lowest  concentration of SARS-CoV-2 viral copies this assay can detect is 250  copies / mL. A negative result does not preclude SARS-CoV-2 infection  and should not be used as the sole basis for treatment or other  patient management decisions.  A negative result may occur with  improper specimen collection / handling, submission of specimen other  than nasopharyngeal swab, presence of viral mutation(s) within the  areas targeted by this assay, and inadequate number of viral copies  (<250 copies / mL). A negative result must be combined with clinical  observations, patient history, and epidemiological information. If result is POSITIVE SARS-CoV-2 target nucleic acids are DETECTED. The SARS-CoV-2 RNA is generally detectable in upper and lower  respiratory specimens dur ing the acute phase of infection.  Positive  results are indicative of active infection with SARS-CoV-2.  Clinical  correlation with patient history and other diagnostic information is  necessary to determine patient infection status.  Positive results do  not rule out bacterial infection or co-infection with other viruses. If result is PRESUMPTIVE POSTIVE SARS-CoV-2 nucleic acids MAY BE PRESENT.   A presumptive positive result was obtained on the submitted specimen  and confirmed on repeat testing.  While 2019 novel coronavirus  (SARS-CoV-2) nucleic acids  may be present in the submitted sample  additional confirmatory testing may be necessary for epidemiological  and / or clinical management purposes  to differentiate between  SARS-CoV-2 and other Sarbecovirus currently known to infect humans.  If clinically indicated additional testing with an alternate test  methodology [phone]) is advised. The SARS-CoV-2 RNA is generally  detectable in upper and lower respiratory sp ecimens during the acute  phase of infection. The expected result is Negative. Fact Sheet for Patients:  StrictlyIdeas.no Fact Sheet for Healthcare Providers: BankingDealers.co.za This test is not yet approved or cleared by the [place] FDA and has been authorized for detection and/or diagnosis of SARS-CoV-2 by FDA under an Emergency Use Authorization (EUA).  This EUA will remain in effect (meaning this test can be used) for the duration of the COVID-19 declaration under Section 564(b)(1) of the Act, 21 U.S.C. section 360bbb-3(b)(1), unless the authorization is terminated or revoked sooner. Performed at [facility], [address] [place], [place] [address]   Troponin I - ONCE - STAT     Status: None   Collection Time: 11/05/18 12:20 PM  Result Value Ref Range   Troponin I <0.03 <0.03 ng/mL    Comment: Performed at [facility], [address] [place], [place] [address]   Dg Chest 1 View  Result Date: 11/05/2018 CLINICAL DATA:  Body aches. EXAM: CHEST  1 VIEW COMPARISON:  11/03/2018. FINDINGS: Mild mediastinal prominence again noted. This may be secondary to AP portable technique. To exclude persistent mediastinal prominence a PA and lateral chest x-ray suggested. Low lung volumes with bibasilar atelectasis/infiltrates. No pleural effusion or pneumothorax. Borderline cardiomegaly. IMPRESSION: 1. Mild mediastinal prominence again noted. This may be secondary to AP portable technique. To exclude  persistent mediastinal prominence a PA lateral chest x-ray suggested. 2.  Low lung volumes with mild bibasilar atelectasis/infiltrates. Electronically Signed   By: Marcello Moores  Register   On: 11/05/2018 12:24   Dg Chest 2 View  Result Date: 11/05/2018 CLINICAL DATA:  Mediastinal evaluation requested. History of hypertension. Smoking. EXAM: CHEST - 2 VIEW COMPARISON:  11/05/2018. FINDINGS: Persistent mediastinal fullness noted on both AP and lateral view. Mediastinal fullness could be secondary to AP projection and or related to prominent great vessels and or prominent mediastinal fat. Given the fullness on the lateral view it may be wise to obtain contrast-enhanced chest CT for further evaluation to exclude mediastinal mass. Cardiomegaly. No pulmonary venous congestion. No focal infiltrate. No pleural effusion or pneumothorax. No acute bony abnormality. Mild thoracic spine scoliosis concave left. IMPRESSION: 1. Persistent upper mediastinal fullness as described above. Although mediastinal widening may be related to AP projection and or to prominent great vessels and or prominent mediastinal fat, given the mediastinal fullness on lateral view it may be wise to obtain contrast-enhanced chest CT for further evaluation to exclude mediastinal mass. 2.  Stable cardiomegaly. Electronically Signed   By: Marcello Moores  Register   On: 11/05/2018 16:13   Dg Chest Port 1 View  Result Date: 11/03/2018 CLINICAL DATA:  Sore throat.  Cough and chest pain. EXAM: PORTABLE CHEST 1 VIEW COMPARISON:  May 20, 2018 FINDINGS: The heart, hila, and mediastinum are unchanged since at least April 2019. No pneumothorax. No nodules or masses. No focal infiltrates. Mild prominence of the mediastinum is stable. IMPRESSION: 1. No interval changes.  No focal infiltrate. 2. Mild prominence of the mediastinum could be due to mediastinal fat, adenopathy, or the low volume portable technique. Electronically Signed   By: Shanon Brow  Mee Hives M.D   On:  11/03/2018 18:24   US Abdomen Limited Ruq  Result Date: 11/05/2018 CLINICAL DATA:  Right upper quadrant abdominal pain. No time course given. EXAM: ULTRASOUND ABDOMEN LIMITED RIGHT UPPER QUADRANT COMPARISON:  03/12/2018 FINDINGS: Gallbladder: No gallstones or wall thickening visualized. No sonographic Murphy sign noted by sonographer. Common bile duct: Diameter: 3.4 mm Liver: No focal lesion identified. Within normal limits in parenchymal echogenicity. Portal vein is patent on color Doppler imaging with normal direction of blood flow towards the liver. IMPRESSION: Unremarkable right upper quadrant ultrasound examination. Electronically Signed   By: Marijo Sanes M.D.   On: 11/05/2018 15:11    Pending Labs Unresulted Labs (From admission, onward)    Start     Ordered   11/06/18 0500  Procalcitonin  Daily,   STAT     11/05/18 1440   11/06/18 0500  Protime-INR  Tomorrow morning,   STAT     11/05/18 1455   11/06/18 0500  Cortisol-am, blood  Tomorrow morning,   STAT     11/05/18 1455   11/05/18 1452  HIV antibody (Routine Testing)  Once,   STAT     11/05/18 1455   11/05/18 1214  Urine culture  ONCE - STAT,   STAT    Question:  Patient immune status  Answer:  Normal   11/05/18 1213   11/05/18 1206  Blood culture (routine x 2)  BLOOD CULTURE X 2,   STAT     11/05/18 1205          Vitals/Pain Today's Vitals   11/05/18 1515 11/05/18 1530 11/05/18 1600 11/05/18 1630  BP: 114/76 122/70 119/70 121/79  Pulse: (!) 109 (!) 113 (!) 105 (!) 107  Resp: 20 (!) 24 (!) 21 (!) 21  Temp: 98.6 F (37 C)     TempSrc:      SpO2: 100% 100% 100% 100%  Weight:      Height:      PainSc: 5        Isolation Precautions Droplet and Contact precautions  Medications Medications  enoxaparin (LOVENOX) injection 40 mg (has no administration in time range)  0.9 %  sodium chloride infusion (has no administration in time range)  ceFEPIme (MAXIPIME) 2 g in sodium chloride 0.9 % 100 mL IVPB (has no  administration in time range)  vancomycin (VANCOCIN) 1,250 mg in sodium chloride 0.9 % 250 mL IVPB (has no administration in time range)  acetaminophen (TYLENOL) tablet 1,000 mg (1,000 mg Oral Given 11/05/18 1252)  sodium chloride 0.9 % bolus 1,000 mL (0 mLs Intravenous Stopped 11/05/18 1346)  ceFEPIme (MAXIPIME) 2 g in sodium chloride 0.9 % 100 mL IVPB (0 g Intravenous Stopped 11/05/18 1346)  vancomycin (VANCOCIN) 2,000 mg in sodium chloride 0.9 % 500 mL IVPB (0 mg Intravenous Stopped 11/05/18 1602)  sodium chloride 0.9 % bolus 1,000 mL (1,000 mLs Intravenous New Bag/Given 11/05/18 1518)  iohexol (OMNIPAQUE) 350 MG/ML injection 75 mL (75 mLs Intravenous Contrast Given 11/05/18 1659)    Mobility walks Low fall risk   Focused Assessments Pulmonary Assessment Handoff:  Lung sounds: L Breath Sounds: Diminished R Breath Sounds: Clear O2 Device: Nasal Cannula        R Recommendations: See Admitting Provider Note  Report given to:   Additional Notes:

## 2018-11-05 NOTE — ED Notes (Signed)
Family at front desk to drop off bag with clothing and phone charger.

## 2018-11-06 ENCOUNTER — Inpatient Hospital Stay: Payer: Self-pay

## 2018-11-06 DIAGNOSIS — R079 Chest pain, unspecified: Secondary | ICD-10-CM

## 2018-11-06 LAB — COMPREHENSIVE METABOLIC PANEL
ALT: 49 U/L — ABNORMAL HIGH (ref 0–44)
AST: 62 U/L — ABNORMAL HIGH (ref 15–41)
Albumin: 3.4 g/dL — ABNORMAL LOW (ref 3.5–5.0)
Alkaline Phosphatase: 47 U/L (ref 38–126)
Anion gap: 7 (ref 5–15)
BUN: 8 mg/dL (ref 6–20)
CO2: 26 mmol/L (ref 22–32)
Calcium: 8.2 mg/dL — ABNORMAL LOW (ref 8.9–10.3)
Chloride: 103 mmol/L (ref 98–111)
Creatinine, Ser: 0.5 mg/dL (ref 0.44–1.00)
GFR calc Af Amer: >60 mL/min (ref >60)
GFR calc non Af Amer: >60 mL/min (ref >60)
Glucose, Bld: 114 mg/dL — ABNORMAL HIGH (ref 70–99)
Potassium: 3.6 mmol/L (ref 3.5–5.1)
Sodium: 136 mmol/L (ref 135–145)
Total Bilirubin: 1.1 mg/dL (ref 0.3–1.2)
Total Protein: 7.1 g/dL (ref 6.5–8.1)

## 2018-11-06 LAB — CBC
HCT: 32 % — ABNORMAL LOW (ref 36.0–46.0)
Hemoglobin: 10.7 g/dL — ABNORMAL LOW (ref 12.0–15.0)
MCH: 27.2 pg (ref 26.0–34.0)
MCHC: 33.4 g/dL (ref 30.0–36.0)
MCV: 81.4 fL (ref 80.0–100.0)
Platelets: 312 10*3/uL (ref 150–400)
RBC: 3.93 MIL/uL (ref 3.87–5.11)
RDW: 15.3 % (ref 11.5–15.5)
WBC: 18.5 10*3/uL — ABNORMAL HIGH (ref 4.0–10.5)
nRBC: 0 % (ref 0.0–0.2)

## 2018-11-06 LAB — CORTISOL-AM, BLOOD: Cortisol - AM: 18.8 ug/dL (ref 6.7–22.6)

## 2018-11-06 LAB — TROPONIN I: Troponin I: <0.03 ng/mL (ref <0.03)

## 2018-11-06 LAB — HIV ANTIBODY (ROUTINE TESTING W REFLEX): HIV Screen 4th Generation wRfx: NONREACTIVE

## 2018-11-06 LAB — PROCALCITONIN: Procalcitonin: <0.1 ng/mL

## 2018-11-06 LAB — FIBRIN DERIVATIVES D-DIMER (ARMC ONLY): Fibrin derivatives D-dimer (ARMC): 1308.51 ng/mL (FEU) — ABNORMAL HIGH (ref 0.00–499.00)

## 2018-11-06 LAB — PROTIME-INR
INR: 1 (ref 0.8–1.2)
Prothrombin Time: 13.4 seconds (ref 11.4–15.2)

## 2018-11-06 MED ORDER — ACETAMINOPHEN 325 MG PO TABS
650.0000 mg | ORAL_TABLET | Freq: Four times a day (QID) | ORAL | Status: DC | PRN
  Administered 2018-11-06 (×4): 650 mg via ORAL
  Filled 2018-11-06 (×4): qty 2

## 2018-11-06 MED ORDER — SODIUM CHLORIDE 0.9 % IV BOLUS
1000.0000 mL | Freq: Once | INTRAVENOUS | Status: AC
  Administered 2018-11-06: 06:00:00 1000 mL via INTRAVENOUS

## 2018-11-06 MED ORDER — ONDANSETRON HCL 4 MG/2ML IJ SOLN
4.0000 mg | Freq: Four times a day (QID) | INTRAMUSCULAR | Status: DC | PRN
  Administered 2018-11-06: 4 mg via INTRAVENOUS
  Filled 2018-11-06: qty 2

## 2018-11-06 MED ORDER — IBUPROFEN 400 MG PO TABS
400.0000 mg | ORAL_TABLET | Freq: Four times a day (QID) | ORAL | Status: DC | PRN
  Administered 2018-11-06 (×2): 400 mg via ORAL
  Filled 2018-11-06 (×2): qty 1

## 2018-11-06 MED ORDER — ACETAMINOPHEN 325 MG PO TABS
ORAL_TABLET | ORAL | Status: AC
  Filled 2018-11-06: qty 2

## 2018-11-06 MED ORDER — GUAIFENESIN-DM 100-10 MG/5ML PO SYRP
5.0000 mL | ORAL_SOLUTION | ORAL | Status: DC | PRN
  Administered 2018-11-06: 5 mL via ORAL
  Filled 2018-11-06: qty 5

## 2018-11-06 MED ORDER — METOPROLOL TARTRATE 25 MG PO TABS
12.5000 mg | ORAL_TABLET | Freq: Once | ORAL | Status: AC
  Administered 2018-11-06: 12.5 mg via ORAL
  Filled 2018-11-06: qty 1

## 2018-11-06 MED ORDER — METOPROLOL TARTRATE 5 MG/5ML IV SOLN: 5.0000 mg | INTRAVENOUS | Status: DC

## 2018-11-06 MED ORDER — VANCOMYCIN HCL 1.25 G IV SOLR
1250.0000 mg | INTRAVENOUS | Status: DC
  Administered 2018-11-06: 23:00:00 1250 mg via INTRAVENOUS
  Filled 2018-11-06 (×2): qty 1250

## 2018-11-06 MED ORDER — METOPROLOL TARTRATE 5 MG/5ML IV SOLN
5.0000 mg | INTRAVENOUS | Status: AC
  Administered 2018-11-06: 23:00:00 5 mg via INTRAVENOUS
  Filled 2018-11-06: qty 5

## 2018-11-06 NOTE — Progress Notes (Signed)
[facility] NAME: Yesenia Carrillo    MR#:  [id number]  DATE OF BIRTH:  [birth year]  SUBJECTIVE:  CHIEF COMPLAINT:   Chief Complaint  Patient presents with  . Shortness of Breath   Patient noted to have had fevers with temperature of 103 last night.  Oxygen requirement was increased to 5 L with oxygen saturation in the 90s.  Resting comfortably with no shortness of breath this morning.  Complained of some back pain which is chronic..  Tylenol was given.  Avoiding narcotics.  REVIEW OF SYSTEMS:  Review of Systems  Constitutional: Positive for fever. Negative for chills.  HENT: Negative for hearing loss and tinnitus.   Eyes: Negative for blurred vision.  Respiratory: Positive for cough and shortness of breath. Negative for wheezing.   Cardiovascular: Negative for chest pain and orthopnea.  Gastrointestinal: Negative for heartburn and nausea.  Genitourinary: Negative for dysuria and urgency.  Musculoskeletal: Positive for back pain. Negative for myalgias.  Skin: Negative for rash.  Neurological: Negative for dizziness and headaches.  Psychiatric/Behavioral: Negative for depression and hallucinations.    DRUG ALLERGIES:   Allergies  Allergen Reactions  . Ampicillin Rash and Other (See Comments)    Patient and family cannot remember the specifics of the reaction.  Marland Kitchen Penicillins Hives   VITALS:  Blood pressure (!) 144/95, pulse (!) 136, temperature (!) 100.9 F (38.3 C), temperature source Oral, resp. rate 20, height 5\' 1"  (1.549 m), weight 99.8 kg, last menstrual period 10/19/2018, SpO2 93 %. PHYSICAL EXAMINATION:  Physical Exam   GENERAL:  23 y.o.-year-old patient lying in the bed with no acute distress.  EYES: Pupils equal, round, reactive to light and accommodation. No scleral icterus. Extraocular muscles intact.  HEENT: Head atraumatic, normocephalic. Oropharynx and nasopharynx clear.  NECK:  Supple, no jugular venous  distention. No thyroid enlargement, no tenderness.  LUNGS: Mild rhonchi bilaterally.  No wheezing.  No use of accessory muscles of respiration.  CARDIOVASCULAR: S1, S2 normal. No murmurs, rubs, or gallops.  ABDOMEN: Soft, nontender, nondistended. Bowel sounds present. No organomegaly or mass.  EXTREMITIES: No pedal edema, cyanosis, or clubbing.  NEUROLOGIC: Patient awake and alert.  Moving all extremities with no focal deficits.  Sensation intact.  PSYCHIATRIC: The patient is alert and oriented x 3.  SKIN: No obvious rash, lesion, or ulcer.  LABORATORY PANEL:  Female CBC Recent Labs  Lab 11/06/18 0645  WBC 18.5*  HGB 10.7*  HCT 32.0*  PLT 312   ------------------------------------------------------------------------------------------------------------------ Chemistries  Recent Labs  Lab 11/06/18 0645  NA 136  K 3.6  CL 103  CO2 26  GLUCOSE 114*  BUN 8  CREATININE 0.50  CALCIUM 8.2*  AST 62*  ALT 49*  ALKPHOS 47  BILITOT 1.1   RADIOLOGY:  Dg Chest 1 View  Result Date: 11/05/2018 CLINICAL DATA:  Body aches. EXAM: CHEST  1 VIEW COMPARISON:  11/03/2018. FINDINGS: Mild mediastinal prominence again noted. This may be secondary to AP portable technique. To exclude persistent mediastinal prominence a PA and lateral chest x-ray suggested. Low lung volumes with bibasilar atelectasis/infiltrates. No pleural effusion or pneumothorax. Borderline cardiomegaly. IMPRESSION: 1. Mild mediastinal prominence again noted. This may be secondary to AP portable technique. To exclude persistent mediastinal prominence a PA lateral chest x-ray suggested. 2.  Low lung volumes with mild bibasilar atelectasis/infiltrates. Electronically Signed   By: Marcello Moores  Register   On: 11/05/2018 12:24   Dg Chest 2 View  Result Date: 11/05/2018  CLINICAL DATA:  Mediastinal evaluation requested. History of hypertension. Smoking. EXAM: CHEST - 2 VIEW COMPARISON:  11/05/2018. FINDINGS: Persistent mediastinal fullness noted  on both AP and lateral view. Mediastinal fullness could be secondary to AP projection and or related to prominent great vessels and or prominent mediastinal fat. Given the fullness on the lateral view it may be wise to obtain contrast-enhanced chest CT for further evaluation to exclude mediastinal mass. Cardiomegaly. No pulmonary venous congestion. No focal infiltrate. No pleural effusion or pneumothorax. No acute bony abnormality. Mild thoracic spine scoliosis concave left. IMPRESSION: 1. Persistent upper mediastinal fullness as described above. Although mediastinal widening may be related to AP projection and or to prominent great vessels and or prominent mediastinal fat, given the mediastinal fullness on lateral view it may be wise to obtain contrast-enhanced chest CT for further evaluation to exclude mediastinal mass. 2.  Stable cardiomegaly. Electronically Signed   By: Marcello Moores  Register   On: 11/05/2018 16:13   Ct Angio Chest Pe W Or Wo Contrast  Result Date: 11/05/2018 CLINICAL DATA:  Shortness of breath and chest pain EXAM: CT ANGIOGRAPHY CHEST WITH CONTRAST TECHNIQUE: Multidetector CT imaging of the chest was performed using the standard protocol during bolus administration of intravenous contrast. Multiplanar CT image reconstructions and MIPs were obtained to evaluate the vascular anatomy. CONTRAST:  16mL OMNIPAQUE IOHEXOL 350 MG/ML SOLN COMPARISON:  Chest CT May 02, 2018; chest radiograph November 05, 2018 FINDINGS: Cardiovascular: There is no demonstrable pulmonary embolus. There is no thoracic aortic aneurysm or dissection. Visualized great vessels appear unremarkable. There is no appreciable pericardial effusion or pericardial thickening. Mediastinum/Nodes: Visualized thyroid appears normal. There are lymph nodes in the aortopulmonary window region, largest measuring 2.1 by 1.3 cm. There is a lymph node to the left of the carina measuring 1.2 x 1.1 cm. There is a lymph node to the right of the distal  trachea measuring 1.3 x 1.1 cm. There is a subcarinal lymph node measuring 1.1 x 1.1 cm. There is adenopathy inferior to the right hilum with the largest lymph node in this area measuring 1.6 x 1.5 cm. No esophageal lesions are evident. Lungs/Pleura: There is extensive airspace opacity throughout much of the left lung with involvement of multiple segments in the left upper lobe, lingula, and left lower lobe. There is patchy airspace opacity in portions of the lateral and posterior segments of the right lower lobe. There is also infiltrate is noted in the posterior segment of the right upper lobe. No pleural effusions are evident. Upper Abdomen: Visualized upper abdominal structures appear unremarkable. Musculoskeletal: There are no blastic or lytic bone lesions. No evident chest wall lesions. Review of the MIP images confirms the above findings. IMPRESSION: 1. No demonstrable pulmonary embolus. No thoracic aortic aneurysm or dissection. 2. Multifocal pneumonia, more extensive on the left than on the right. Note that atypical organism pneumonia including viral type pneumonia may present in this manner. Hypersensitivity pneumonitis is a differential consideration in this circumstance. 3. Multiple enlarged lymph nodes noted. Question reactive etiology given the extensive pneumonia. Note that there was adenopathy as well as multifocal pneumonia on most recent CT chest examination from November 2019. Electronically Signed   By: Lowella Grip III M.D.   On: 11/05/2018 17:17   US Abdomen Limited Ruq  Result Date: 11/05/2018 CLINICAL DATA:  Right upper quadrant abdominal pain. No time course given. EXAM: ULTRASOUND ABDOMEN LIMITED RIGHT UPPER QUADRANT COMPARISON:  03/12/2018 FINDINGS: Gallbladder: No gallstones or wall thickening visualized. No sonographic Percell Miller  sign noted by sonographer. Common bile duct: Diameter: 3.4 mm Liver: No focal lesion identified. Within normal limits in parenchymal echogenicity. Portal  vein is patent on color Doppler imaging with normal direction of blood flow towards the liver. IMPRESSION: Unremarkable right upper quadrant ultrasound examination. Electronically Signed   By: Marijo Sanes M.D.   On: 11/05/2018 15:11   ASSESSMENT AND PLAN:   23 y.o. 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  1. Sepsis -likely secondary to pneumonia and UTI (patient presenting with fever 102.9, sats 85, tachycardia 147, tachypnea, and elevated WBC) - Prior hx of ARDS secondary to pneumonia requiring ECMO support in the past at [place] 2 negative - Ultrasound of right upper quadrant negative for acute finding - Chest x-ray shows persistent upper mediastinal fullness unable to exclude mediastinal large CT chest done revealed multifocal pneumonia.  No pulmonary embolism. Patient already on broad-spectrum IV antibiotics with vancomycin and cefepime.  Monitor clinically.  2.  Community-acquired pneumonia- presenting with fever, leukocytosis - Chest x-ray as above - Empiric antibiotics with cefepime and vancomycin as above Follow-up on blood cultures.  Monitor clinically.  IV fluid hydration.    3. UTI - UA shows positive for UTI Continue back in a biotics including cefepime pending results of urine cultures  4. HTN + Goal BP <140/90 - Hold HCTZ due to  Sepsis as above  5.  Hypoxia secondary to multifocal pneumonia Continue supplemental oxygen and wean as tolerated. Patient otherwise hemodynamically stable and no indication for transfer to stepdown unit at this time.  6.  Back pain Patient reports having chronic back pain.  Worsened by laying down on the bed. Tylenol administered.  Advised patient to sit up in chair at the bedside.  Avoiding  narcotics due to respiratory status.    DVT prophylaxis -Lovenox    All the records are reviewed and case discussed with Care Management/Social Worker. Management plans discussed with the patient, family and they are in agreement.  CODE STATUS: Full Code  TOTAL TIME TAKING CARE OF THIS PATIENT: 36 minutes.   More than 50% of the time was spent in counseling/coordination of care: YES  POSSIBLE D/C IN 2 DAYS, DEPENDING ON CLINICAL CONDITION.   Shawn Carattini M.D on 11/06/2018 at 10:38 AM  Between 7am to 6pm - Pager - [phone]  After 6pm go to [email or web address] - Technical brewer [facility]  Office  [phone]  CC: Primary care physician; [place]  Note: This dictation was prepared with Diplomatic Services operational officer dictation along with smaller phrase technology. Any transcriptional errors that result from this process are unintentional.

## 2018-11-06 NOTE — Progress Notes (Signed)
MD present on unit, to see pt, also aware of pts MEWS score

## 2018-11-06 NOTE — Progress Notes (Signed)
Dr Stark Jock messaged about pts current vital signs and o2 requirements

## 2018-11-06 NOTE — Progress Notes (Signed)
Per MD he will see that pt soon

## 2018-11-07 ENCOUNTER — Inpatient Hospital Stay: Payer: Self-pay

## 2018-11-07 DIAGNOSIS — J80 Acute respiratory distress syndrome: Secondary | ICD-10-CM

## 2018-11-07 DIAGNOSIS — A4150 Gram-negative sepsis, unspecified: Secondary | ICD-10-CM | POA: Insufficient documentation

## 2018-11-07 LAB — CBC
HCT: 31.9 % — ABNORMAL LOW (ref 36.0–46.0)
Hemoglobin: 10.5 g/dL — ABNORMAL LOW (ref 12.0–15.0)
MCH: 27.1 pg (ref 26.0–34.0)
MCHC: 32.9 g/dL (ref 30.0–36.0)
MCV: 82.4 fL (ref 80.0–100.0)
Platelets: 285 10*3/uL (ref 150–400)
RBC: 3.87 MIL/uL (ref 3.87–5.11)
RDW: 15.3 % (ref 11.5–15.5)
WBC: 17.3 10*3/uL — ABNORMAL HIGH (ref 4.0–10.5)
nRBC: 0 % (ref 0.0–0.2)

## 2018-11-07 LAB — URINE CULTURE
Culture: >=100000 — AB
Special Requests: NORMAL

## 2018-11-07 LAB — MAGNESIUM: Magnesium: 2 mg/dL (ref 1.7–2.4)

## 2018-11-07 LAB — TROPONIN I
Troponin I: <0.03 ng/mL (ref <0.03)
Troponin I: <0.03 ng/mL (ref <0.03)

## 2018-11-07 LAB — BLOOD GAS, ARTERIAL
Acid-Base Excess: 3.3 mmol/L — ABNORMAL HIGH (ref 0.0–2.0)
Acid-Base Excess: 1.7 mmol/L (ref 0.0–2.0)
Bicarbonate: 29.1 mmol/L — ABNORMAL HIGH (ref 20.0–28.0)
Bicarbonate: 33.2 mmol/L — ABNORMAL HIGH (ref 20.0–28.0)
FIO2: 0.6
FIO2: 1
Mechanical Rate: 28
O2 Saturation: 93.1 %
O2 Saturation: 91.5 %
PEEP: 12 cmH2O
Patient temperature: 37
Patient temperature: 37
Pressure control: 24 cmH2O
RATE: 28 resp/min
pCO2 arterial: 48 mmHg (ref 32.0–48.0)
pCO2 arterial: 91 mmHg (ref 32.0–48.0)
pH, Arterial: 7.39 (ref 7.350–7.450)
pH, Arterial: 7.17 — CL (ref 7.350–7.450)
pO2, Arterial: 68 mmHg — ABNORMAL LOW (ref 83.0–108.0)
pO2, Arterial: 78 mmHg — ABNORMAL LOW (ref 83.0–108.0)

## 2018-11-07 LAB — LACTIC ACID, PLASMA: Lactic Acid, Venous: 0.6 mmol/L (ref 0.5–1.9)

## 2018-11-07 LAB — BASIC METABOLIC PANEL
Anion gap: 9 (ref 5–15)
BUN: 8 mg/dL (ref 6–20)
CO2: 27 mmol/L (ref 22–32)
Calcium: 8.8 mg/dL — ABNORMAL LOW (ref 8.9–10.3)
Chloride: 103 mmol/L (ref 98–111)
Creatinine, Ser: 0.47 mg/dL (ref 0.44–1.00)
GFR calc Af Amer: >60 mL/min (ref >60)
GFR calc non Af Amer: >60 mL/min (ref >60)
Glucose, Bld: 104 mg/dL — ABNORMAL HIGH (ref 70–99)
Potassium: 3.4 mmol/L — ABNORMAL LOW (ref 3.5–5.1)
Sodium: 139 mmol/L (ref 135–145)

## 2018-11-07 LAB — GLUCOSE, CAPILLARY: Glucose-Capillary: 78 mg/dL (ref 70–99)

## 2018-11-07 LAB — PHOSPHORUS: Phosphorus: 3.4 mg/dL (ref 2.5–4.6)

## 2018-11-07 LAB — HEPATITIS PANEL, ACUTE
HCV Ab: <0.1 s/co ratio (ref 0.0–0.9)
Hep A IgM: NEGATIVE
Hep B C IgM: NEGATIVE
Hepatitis B Surface Ag: NEGATIVE

## 2018-11-07 LAB — PROCALCITONIN: Procalcitonin: 0.17 ng/mL

## 2018-11-07 LAB — MRSA PCR SCREENING: MRSA by PCR: NEGATIVE

## 2018-11-07 MED ORDER — METOPROLOL TARTRATE 5 MG/5ML IV SOLN
5.0000 mg | Freq: Once | INTRAVENOUS | Status: AC
  Administered 2018-11-07: 5 mg via INTRAVENOUS
  Filled 2018-11-07: qty 5

## 2018-11-07 MED ORDER — POTASSIUM CHLORIDE 20 MEQ PO PACK: 40.0000 meq | PACK | ORAL | Status: DC

## 2018-11-07 MED ORDER — FENTANYL CITRATE (PF) 100 MCG/2ML IJ SOLN
50.0000 ug | Freq: Once | INTRAMUSCULAR | Status: AC
  Administered 2018-11-07: 100 ug via INTRAVENOUS

## 2018-11-07 MED ORDER — DOCUSATE SODIUM 50 MG/5ML PO LIQD: 100.0000 mg | Freq: Two times a day (BID) | ORAL | 0 refills | Status: DC | PRN

## 2018-11-07 MED ORDER — METHYLPREDNISOLONE SODIUM SUCC 40 MG IJ SOLR: 40.0000 mg | Freq: Two times a day (BID) | INTRAMUSCULAR | Status: DC

## 2018-11-07 MED ORDER — FLUTICASONE PROPIONATE 50 MCG/ACT NA SUSP
1.0000 | Freq: Every day | NASAL | Status: DC
  Filled 2018-11-07: qty 16

## 2018-11-07 MED ORDER — POTASSIUM CHLORIDE 20 MEQ PO PACK
40.0000 meq | PACK | ORAL | Status: DC
  Administered 2018-11-07: 40 meq
  Filled 2018-11-07: qty 2

## 2018-11-07 MED ORDER — LEVALBUTEROL HCL 0.63 MG/3ML IN NEBU
INHALATION_SOLUTION | RESPIRATORY_TRACT | Status: AC
  Administered 2018-11-07: 0.63 mg
  Filled 2018-11-07: qty 3

## 2018-11-07 MED ORDER — IBUPROFEN 400 MG PO TABS: 400.0000 mg | ORAL_TABLET | Freq: Four times a day (QID) | ORAL | 0 refills | Status: DC | PRN

## 2018-11-07 MED ORDER — FUROSEMIDE 10 MG/ML IJ SOLN
60.0000 mg | Freq: Once | INTRAMUSCULAR | Status: AC
  Administered 2018-11-07: 60 mg via INTRAVENOUS

## 2018-11-07 MED ORDER — TRAMADOL HCL 50 MG PO TABS
50.0000 mg | ORAL_TABLET | ORAL | Status: AC
  Administered 2018-11-07: 05:00:00 50 mg via ORAL
  Filled 2018-11-07: qty 1

## 2018-11-07 MED ORDER — ONDANSETRON HCL 4 MG/2ML IJ SOLN: 4.0000 mg | Freq: Four times a day (QID) | INTRAMUSCULAR | 0 refills | Status: DC | PRN

## 2018-11-07 MED ORDER — LEVALBUTEROL HCL 0.63 MG/3ML IN NEBU: 0.6300 mg | INHALATION_SOLUTION | Freq: Three times a day (TID) | RESPIRATORY_TRACT | Status: DC | PRN

## 2018-11-07 MED ORDER — ALBUTEROL SULFATE (2.5 MG/3ML) 0.083% IN NEBU
2.5000 mg | INHALATION_SOLUTION | RESPIRATORY_TRACT | Status: DC
  Administered 2018-11-07: 09:00:00 2.5 mg via RESPIRATORY_TRACT
  Filled 2018-11-07: qty 3

## 2018-11-07 MED ORDER — ACETAMINOPHEN 325 MG PO TABS: 650.0000 mg | ORAL_TABLET | Freq: Four times a day (QID) | ORAL | Status: DC | PRN

## 2018-11-07 MED ORDER — FAMOTIDINE IN NACL 20-0.9 MG/50ML-% IV SOLN: 20.0000 mg | Freq: Two times a day (BID) | INTRAVENOUS | Status: DC

## 2018-11-07 MED ORDER — SODIUM CHLORIDE 0.9 % IV SOLN: 2.0000 g | Freq: Three times a day (TID) | INTRAVENOUS | Status: DC

## 2018-11-07 MED ORDER — FENTANYL 2500MCG IN NS 250ML (10MCG/ML) PREMIX INFUSION
50.0000 ug/h | INTRAVENOUS | Status: DC
  Administered 2018-11-07: 100 ug/h via INTRAVENOUS
  Filled 2018-11-07: qty 250

## 2018-11-07 MED ORDER — BUTALBITAL-APAP-CAFFEINE 50-325-40 MG PO TABS
1.0000 | ORAL_TABLET | ORAL | Status: DC | PRN
  Administered 2018-11-07: 03:00:00 1 via ORAL
  Filled 2018-11-07 (×3): qty 1

## 2018-11-07 MED ORDER — ACETAMINOPHEN 325 MG PO TABS
650.0000 mg | ORAL_TABLET | Freq: Once | ORAL | Status: AC
  Administered 2018-11-07: 01:00:00 650 mg via ORAL

## 2018-11-07 MED ORDER — FENTANYL BOLUS VIA INFUSION
50.0000 ug | INTRAVENOUS | Status: DC | PRN
  Filled 2018-11-07: qty 50

## 2018-11-07 MED ORDER — DOCUSATE SODIUM 50 MG/5ML PO LIQD: 100.0000 mg | Freq: Two times a day (BID) | ORAL | Status: DC | PRN

## 2018-11-07 MED ORDER — DILTIAZEM HCL 25 MG/5ML IV SOLN
5.0000 mg | Freq: Once | INTRAVENOUS | Status: AC
  Administered 2018-11-07: 5 mg via INTRAVENOUS
  Filled 2018-11-07: qty 5

## 2018-11-07 MED ORDER — MAGNESIUM SULFATE 2 GM/50ML IV SOLN: 2.0000 g | Freq: Once | INTRAVENOUS | Status: DC

## 2018-11-07 MED ORDER — FENTANYL CITRATE (PF) 100 MCG/2ML IJ SOLN
INTRAMUSCULAR | Status: AC
  Administered 2018-11-07: 08:00:00 100 ug via INTRAVENOUS
  Filled 2018-11-07: qty 4

## 2018-11-07 MED ORDER — FENTANYL BOLUS VIA INFUSION: 50.0000 ug | INTRAVENOUS | 0 refills | Status: DC | PRN

## 2018-11-07 MED ORDER — ENOXAPARIN SODIUM 40 MG/0.4ML ~~LOC~~ SOLN: 40.0000 mg | SUBCUTANEOUS | Status: DC

## 2018-11-07 MED ORDER — METHYLPREDNISOLONE SODIUM SUCC 40 MG IJ SOLR: 40.0000 mg | Freq: Two times a day (BID) | INTRAMUSCULAR | 0 refills | Status: DC

## 2018-11-07 MED ORDER — FENTANYL 2500MCG IN NS 250ML (10MCG/ML) PREMIX INFUSION: 50.0000 ug/h | INTRAVENOUS | Status: DC

## 2018-11-07 MED ORDER — FLUTICASONE PROPIONATE 50 MCG/ACT NA SUSP: 1.0000 | Freq: Every day | NASAL | 2 refills | Status: DC

## 2018-11-07 MED ORDER — STERILE WATER FOR INJECTION IJ SOLN
INTRAMUSCULAR | Status: AC
  Administered 2018-11-07: 08:00:00
  Filled 2018-11-07: qty 10

## 2018-11-07 MED ORDER — LEVALBUTEROL HCL 0.63 MG/3ML IN NEBU: 0.6300 mg | INHALATION_SOLUTION | Freq: Three times a day (TID) | RESPIRATORY_TRACT | 12 refills | Status: DC | PRN

## 2018-11-07 MED ORDER — MIDAZOLAM HCL 2 MG/2ML IJ SOLN
INTRAMUSCULAR | Status: AC
  Administered 2018-11-07: 08:00:00
  Filled 2018-11-07: qty 4

## 2018-11-07 MED ORDER — LORAZEPAM 2 MG/ML IJ SOLN
INTRAMUSCULAR | Status: AC
  Administered 2018-11-07: 10:00:00
  Filled 2018-11-07: qty 2

## 2018-11-07 MED ORDER — DILTIAZEM LOAD VIA INFUSION: 5.0000 mg | Freq: Once | INTRAVENOUS | Status: DC

## 2018-11-07 MED ORDER — VECURONIUM BROMIDE 10 MG IV SOLR
20.0000 mg | Freq: Once | INTRAVENOUS | Status: AC
  Administered 2018-11-07: 10 mg via INTRAVENOUS
  Administered 2018-11-07: 20 mg via INTRAVENOUS

## 2018-11-07 MED ORDER — VECURONIUM BROMIDE 10 MG IV SOLR
INTRAVENOUS | Status: AC
  Administered 2018-11-07: 10 mg via INTRAVENOUS
  Filled 2018-11-07: qty 10

## 2018-11-07 MED ORDER — FAMOTIDINE IN NACL 20-0.9 MG/50ML-% IV SOLN
20.0000 mg | Freq: Two times a day (BID) | INTRAVENOUS | Status: DC
  Administered 2018-11-07: 20 mg via INTRAVENOUS
  Filled 2018-11-07: qty 50

## 2018-11-07 MED ORDER — ALBUTEROL SULFATE (2.5 MG/3ML) 0.083% IN NEBU: 2.5000 mg | INHALATION_SOLUTION | RESPIRATORY_TRACT | 12 refills | Status: DC

## 2018-11-07 NOTE — Consult Note (Addendum)
Name: Yesenia Carrillo MRN: [id number] DOB: [birth year]     CONSULTATION DATE: 11/05/2018  REFERRING MD :  Alvie Heidelberg  CHIEF COMPLAINT:  resp failure  STUDIES:    The CXR was Independently Reviewed By Me Today CXR reviewed-b/l infiltrtaes pneumonia  PREVIOUS ADMISSION TO [facility] 05/03/18 # ARDS secondary to community-acquired pneumonia  # Leukocytosis, presumed secondary to acute illness/inflammatory response  # Thrombocytosis, presumed secondary to acute illness/inflammation  # Acute pulmonary emboli, left segmental, 05/13/18 # Sinus tachycardia  # Retroperitoneal bleed, (4.8 x 0.4 x 10.6 cm, 11/29)      HISTORY OF PRESENT ILLNESS: 23 yo morbidly obese white female with severe resp failure With hypoxia Increased WOB High risk for death Needs emergent intubation Previous historyy of pneumonia /ARDS and ECMO at [facility]  Family requests transfer to [place] :   has a past medical history of Hypertension.  has a past surgical history that includes Tonsillectomy and Wisdom tooth extraction. Prior to Admission medications   Medication Sig Start Date End Date Taking? Authorizing Provider  guaiFENesin-codeine 100-10 MG/5ML syrup Take 5 mLs by mouth every 6 (six) hours as needed for cough. 11/03/18  Yes Harvest Dark, MD  hydrochlorothiazide (MICROZIDE) 12.5 MG capsule Take 12.5 mg by mouth daily.   Yes [provider]   Allergies  Allergen Reactions  . Ampicillin Rash and Other (See Comments)    Patient and family cannot remember the specifics of the reaction.  Marland Kitchen Penicillins Hives    FAMILY HISTORY:  family history is not on file. SOCIAL HISTORY:  reports that she has been smoking cigars. She has never used smokeless tobacco. She reports current alcohol use. She reports that she does not use drugs.  REVIEW OF SYSTEMS:   Unable to obtain due to critical illness   VITAL SIGNS: Temp:  [98.5 F (36.9 C)-100.9 F (38.3 C)] 99.6 F (37.6 C) (06/04  0525) Pulse Rate:  [55-128] 128 (06/04 0705) Resp:  [20-35] 35 (06/04 0525) BP: (134-171)/(84-124) 171/124 (06/04 0705) SpO2:  [89 %-99 %] 89 % (06/04 0705)   I/O last 3 completed shifts: In: 3782 [I.V.:1744; IV Piggyback:2038] Out: 700 [Urine:700] No intake/output data recorded.   SpO2: (!) 89 % O2 Flow Rate (L/min): 10 L/min FiO2 (%): (!) 3 %   Physical Examination:  GENERAL:critically ill appearing, +resp distress HEAD: Normocephalic, atraumatic.  EYES: Pupils equal, round, reactive to light.  No scleral icterus.  MOUTH: Moist mucosal membrane. NECK: Supple. No JVD.  PULMONARY: +rhonchi, +wheezing CARDIOVASCULAR: S1 and S2. Regular rate and rhythm. No murmurs, rubs, or gallops.  GASTROINTESTINAL: Soft, nontender, -distended. No masses. Positive bowel sounds. No hepatosplenomegaly.  MUSCULOSKELETAL: No swelling, clubbing, or edema.  NEUROLOGIC: obtunded SKIN:intact,warm,dry    MEDICATIONS: I have reviewed all medications and confirmed regimen as documented   CULTURE RESULTS   Recent Results (from the past 240 hour(s))  Group A Strep by PCR     Status: None   Collection Time: 11/03/18  6:11 PM  Result Value Ref Range Status   Group A Strep by PCR NOT DETECTED NOT DETECTED Final    Comment: Performed at [facility], [address] [place], [place] [address]  SARS Coronavirus 2 (CEPHEID- Performed in [place] hospital lab), Hosp Order     Status: None   Collection Time: 11/03/18  6:39 PM  Result Value Ref Range Status   SARS Coronavirus 2 NEGATIVE NEGATIVE Final    Comment: (NOTE) If result is NEGATIVE SARS-CoV-2  target nucleic acids are NOT DETECTED. The SARS-CoV-2 RNA is generally detectable in upper and lower  respiratory specimens during the acute phase of infection. The lowest  concentration of SARS-CoV-2 viral copies this assay can detect is 250  copies / mL. A negative result does not preclude SARS-CoV-2 infection  and should not be used as  the sole basis for treatment or other  patient management decisions.  A negative result may occur with  improper specimen collection / handling, submission of specimen other  than nasopharyngeal swab, presence of viral mutation(s) within the  areas targeted by this assay, and inadequate number of viral copies  (<250 copies / mL). A negative result must be combined with clinical  observations, patient history, and epidemiological information. If result is POSITIVE SARS-CoV-2 target nucleic acids are DETECTED. The SARS-CoV-2 RNA is generally detectable in upper and lower  respiratory specimens dur ing the acute phase of infection.  Positive  results are indicative of active infection with SARS-CoV-2.  Clinical  correlation with patient history and other diagnostic information is  necessary to determine patient infection status.  Positive results do  not rule out bacterial infection or co-infection with other viruses. If result is PRESUMPTIVE POSTIVE SARS-CoV-2 nucleic acids MAY BE PRESENT.   A presumptive positive result was obtained on the submitted specimen  and confirmed on repeat testing.  While 2019 novel coronavirus  (SARS-CoV-2) nucleic acids may be present in the submitted sample  additional confirmatory testing may be necessary for epidemiological  and / or clinical management purposes  to differentiate between  SARS-CoV-2 and other Sarbecovirus currently known to infect humans.  If clinically indicated additional testing with an alternate test  methodology [phone]) is advised. The SARS-CoV-2 RNA is generally  detectable in upper and lower respiratory sp ecimens during the acute  phase of infection. The expected result is Negative. Fact Sheet for Patients:  StrictlyIdeas.no Fact Sheet for Healthcare Providers: BankingDealers.co.za This test is not yet approved or cleared by the [place] FDA and has been authorized for  detection and/or diagnosis of SARS-CoV-2 by FDA under an Emergency Use Authorization (EUA).  This EUA will remain in effect (meaning this test can be used) for the duration of the COVID-19 declaration under Section 564(b)(1) of the Act, 21 U.S.C. section 360bbb-3(b)(1), unless the authorization is terminated or revoked sooner. Performed at [facility], [address] [place], [place] [address]   Blood culture (routine x 2)     Status: None (Preliminary result)   Collection Time: 11/05/18 12:10 PM  Result Value Ref Range Status   Specimen Description BLOOD LEFT ANTECUBITAL  Final   Special Requests   Final    BOTTLES DRAWN AEROBIC AND ANAEROBIC Blood Culture adequate volume   Culture   Final    NO GROWTH 2 DAYS Performed at [facility], [address] [place], [place] [address]    Report Status PENDING  Incomplete  Blood culture (routine x 2)     Status: None (Preliminary result)   Collection Time: 11/05/18 12:20 PM  Result Value Ref Range Status   Specimen Description BLOOD BLOOD LEFT WRIST  Final   Special Requests   Final    BOTTLES DRAWN AEROBIC AND ANAEROBIC Blood Culture adequate volume   Culture   Final    NO GROWTH 2 DAYS Performed at [facility], [address], [place], [place] [address]    Report Status PENDING  Incomplete  Urine culture     Status:  Abnormal (Preliminary result)   Collection Time: 11/05/18 12:20 PM  Result Value Ref Range Status   Specimen Description   Final    URINE, CLEAN CATCH Performed at [facility], [address], [place], [place] [address]    Special Requests   Final    Normal Performed at [facility], [address] [place], [place] [address]    Culture >=100,000 COLONIES/mL KLEBSIELLA PNEUMONIAE (A)  Final   Report Status PENDING  Incomplete  SARS Coronavirus 2 (CEPHEID- Performed in [place] hospital lab), Hosp Order     Status: None   Collection Time: 11/05/18 12:20 PM   Result Value Ref Range Status   SARS Coronavirus 2 NEGATIVE NEGATIVE Final    Comment: (NOTE) If result is NEGATIVE SARS-CoV-2 target nucleic acids are NOT DETECTED. The SARS-CoV-2 RNA is generally detectable in upper and lower  respiratory specimens during the acute phase of infection. The lowest  concentration of SARS-CoV-2 viral copies this assay can detect is 250  copies / mL. A negative result does not preclude SARS-CoV-2 infection  and should not be used as the sole basis for treatment or other  patient management decisions.  A negative result may occur with  improper specimen collection / handling, submission of specimen other  than nasopharyngeal swab, presence of viral mutation(s) within the  areas targeted by this assay, and inadequate number of viral copies  (<250 copies / mL). A negative result must be combined with clinical  observations, patient history, and epidemiological information. If result is POSITIVE SARS-CoV-2 target nucleic acids are DETECTED. The SARS-CoV-2 RNA is generally detectable in upper and lower  respiratory specimens dur ing the acute phase of infection.  Positive  results are indicative of active infection with SARS-CoV-2.  Clinical  correlation with patient history and other diagnostic information is  necessary to determine patient infection status.  Positive results do  not rule out bacterial infection or co-infection with other viruses. If result is PRESUMPTIVE POSTIVE SARS-CoV-2 nucleic acids MAY BE PRESENT.   A presumptive positive result was obtained on the submitted specimen  and confirmed on repeat testing.  While 2019 novel coronavirus  (SARS-CoV-2) nucleic acids may be present in the submitted sample  additional confirmatory testing may be necessary for epidemiological  and / or clinical management purposes  to differentiate between  SARS-CoV-2 and other Sarbecovirus currently known to infect humans.  If clinically indicated additional  testing with an alternate test  methodology [phone]) is advised. The SARS-CoV-2 RNA is generally  detectable in upper and lower respiratory sp ecimens during the acute  phase of infection. The expected result is Negative. Fact Sheet for Patients:  StrictlyIdeas.no Fact Sheet for Healthcare Providers: BankingDealers.co.za This test is not yet approved or cleared by the [place] FDA and has been authorized for detection and/or diagnosis of SARS-CoV-2 by FDA under an Emergency Use Authorization (EUA).  This EUA will remain in effect (meaning this test can be used) for the duration of the COVID-19 declaration under Section 564(b)(1) of the Act, 21 U.S.C. section 360bbb-3(b)(1), unless the authorization is terminated or revoked sooner. Performed at [facility], [address] [place], [place] [address]   Respiratory Panel by PCR     Status: None   Collection Time: 11/05/18  6:02 PM  Result Value Ref Range Status   Adenovirus NOT DETECTED NOT DETECTED Final   Coronavirus 229E NOT DETECTED NOT DETECTED Final    Comment: (NOTE) The Coronavirus on the Respiratory  Panel, DOES NOT test for the novel  Coronavirus (2019 nCoV)    Coronavirus HKU1 NOT DETECTED NOT DETECTED Final   Coronavirus NL63 NOT DETECTED NOT DETECTED Final   Coronavirus OC43 NOT DETECTED NOT DETECTED Final   Metapneumovirus NOT DETECTED NOT DETECTED Final   Rhinovirus / Enterovirus NOT DETECTED NOT DETECTED Final   Influenza A NOT DETECTED NOT DETECTED Final   Influenza B NOT DETECTED NOT DETECTED Final   Parainfluenza Virus 1 NOT DETECTED NOT DETECTED Final   Parainfluenza Virus 2 NOT DETECTED NOT DETECTED Final   Parainfluenza Virus 3 NOT DETECTED NOT DETECTED Final   Parainfluenza Virus 4 NOT DETECTED NOT DETECTED Final   Respiratory Syncytial Virus NOT DETECTED NOT DETECTED Final   Bordetella pertussis NOT DETECTED NOT DETECTED Final   Chlamydophila  pneumoniae NOT DETECTED NOT DETECTED Final   Mycoplasma pneumoniae NOT DETECTED NOT DETECTED Final    Comment: Performed at [facility], [place] [address] [place], [place] [address]        CBC    Component Value Date/Time   WBC 17.3 (H) 11/07/2018 0259   RBC 3.87 11/07/2018 0259   HGB 10.5 (L) 11/07/2018 0259   HGB 12.4 06/06/2013 2001   HCT 31.9 (L) 11/07/2018 0259   HCT 36.3 06/06/2013 2001   PLT 285 11/07/2018 0259   PLT 356 06/06/2013 2001   MCV 82.4 11/07/2018 0259   MCV 78 (L) 06/06/2013 2001   MCH 27.1 11/07/2018 0259   MCHC 32.9 11/07/2018 0259   RDW 15.3 11/07/2018 0259   RDW 14.6 (H) 06/06/2013 2001   LYMPHSABS 1.8 11/05/2018 1210   LYMPHSABS 2.5 06/06/2013 2001   MONOABS 1.0 11/05/2018 1210   MONOABS 1.4 (H) 06/06/2013 2001   EOSABS 0.2 11/05/2018 1210   EOSABS 0.1 06/06/2013 2001   BASOSABS 0.0 11/05/2018 1210   BASOSABS 0.1 06/06/2013 2001   BMP Latest Ref Rng & Units 11/07/2018 11/06/2018 11/05/2018  Glucose 70 - 99 mg/dL 104(H) 114(H) 103(H)  BUN 6 - 20 mg/dL 8 8 11   Creatinine 0.44 - 1.00 mg/dL 0.47 0.50 0.69  Sodium 135 - 145 mmol/L 139 136 137  Potassium 3.5 - 5.1 mmol/L 3.4(L) 3.6 3.7  Chloride 98 - 111 mmol/L 103 103 102  CO2 22 - 32 mmol/L 27 26 26   Calcium 8.9 - 10.3 mg/dL 8.8(L) 8.2(L) 8.9       IMAGING    Dg Chest 1 View  Result Date: 11/06/2018 CLINICAL DATA:  Chest pain. EXAM: CHEST  1 VIEW COMPARISON:  11/05/2018. FINDINGS: Marked worsening aeration. Cardiomegaly with BILATERAL pulmonary opacities either represents ARDS, pulmonary edema or significant progression of pneumonia. IMPRESSION: Marked worsening aeration. Electronically Signed   By: Staci Righter M.D.   On: 11/06/2018 21:22    Current Facility-Administered Medications:  .  0.9 %  sodium chloride infusion, , Intravenous, Continuous, Ouma, Bing Neighbors, NP, Stopped at 11/07/18 [phone] .  acetaminophen (TYLENOL) tablet 650 mg, 650 mg, Oral, Q6H PRN, Lance Coon, MD, 650 mg at  11/06/18 2039 .  butalbital-acetaminophen-caffeine (FIORICET) 50-325-40 MG per tablet 1 tablet, 1 tablet, Oral, Q4H PRN, Mansy, Arvella Merles, MD, 1 tablet at 11/07/18 0304 .  ceFEPIme (MAXIPIME) 2 g in sodium chloride 0.9 % 100 mL IVPB, 2 g, Intravenous, Q8H, Mayo, Pete Pelt, MD, Last Rate: 200 mL/hr at 11/07/18 0610, 2 g at 11/07/18 0610 .  enoxaparin (LOVENOX) injection 40 mg, 40 mg, Subcutaneous, Q24H, Ouma, Bing Neighbors, NP, 40 mg at 11/06/18 1323 .  fentaNYL (SUBLIMAZE) 100 MCG/2ML injection, , , ,  .  fluticasone (FLONASE) 50 MCG/ACT nasal spray 1 spray, 1 spray, Each Nare, Daily, Seals, Angela H, NP .  guaiFENesin-dextromethorphan (ROBITUSSIN DM) 100-10 MG/5ML syrup 5 mL, 5 mL, Oral, Q4H PRN, Ojie, Jude, MD, 5 mL at 11/06/18 0540 .  ibuprofen (ADVIL) tablet 400 mg, 400 mg, Oral, Q6H PRN, Stark Jock, Jude, MD, 400 mg at 11/06/18 2215 .  levalbuterol (XOPENEX) nebulizer solution 0.63 mg, 0.63 mg, Nebulization, Q8H PRN, Seals, Angela H, NP .  methylPREDNISolone sodium succinate (SOLU-MEDROL) 40 mg/mL injection 40 mg, 40 mg, Intravenous, Q12H, Brook Mall, MD .  midazolam (VERSED) 2 MG/2ML injection, , , ,  .  ondansetron (ZOFRAN) injection 4 mg, 4 mg, Intravenous, Q6H PRN, Harrie Foreman, MD, 4 mg at 11/06/18 0453 .  sterile water (preservative free) injection, , , ,  .  Vancomycin (VANCOCIN) 1,250 mg in sodium chloride 0.9 % 250 mL IVPB, 1,250 mg, Intravenous, Q18H, Mayo, Pete Pelt, MD, Stopped at 11/07/18 0048 .  vecuronium (NORCURON) 10 MG injection, , , ,         ASSESSMENT AND PLAN SYNOPSIS  23 yo morbidly obese AAF with progressive resp failure progressive b/l infiltrates pneumonia/ARDS  Severe ACUTE Hypoxic and Hypercapnic Respiratory Failure -needs Full MV support Emergent intubation -Bronchodilator Therapy -Wean Fio2 and PEEP as tolerated    NEUROLOGY Obtunded  Needs emergent intubation   CARDIAC ICU monitoring  ID -continue IV abx as prescibed -follow up  cultures  GI GI PROPHYLAXIS as indicated  NUTRITIONAL STATUS DIET-->NPO Constipation protocol as indicated   ENDO - will use ICU hypoglycemic\Hyperglycemia protocol if needed    ELECTROLYTES -follow labs as needed -replace as needed -pharmacy consultation and following   DVT/GI PRX ordered TRANSFUSIONS AS NEEDED MONITOR FSBS ASSESS the need for LABS as needed   Critical Care Time devoted to patient care services described in this note 32 minutes.   Overall, patient is critically ill, prognosis is guarded.   high risk for cardiac arrest and death.    Corrin Parker, M.D.  [facility]  Medical Director [place] Director [facility] Cardio-Pulmonary Department

## 2018-11-07 NOTE — Progress Notes (Signed)
Patient transferred to CCU this am, Patients mother notified and aware.

## 2018-11-07 NOTE — Progress Notes (Signed)
Patient complained of shortness of breath and chest pain, NP notified.  Chest x-ray, troponin , EKG and d-dimer ordered. Patient HR elevated this shift, metoprolol 5mg  IV given per order with improvement. Tyleniol and Ibuprofen given for fever and pain this shift. Respirations elevated, NP notified and aware.

## 2018-11-07 NOTE — Discharge Summary (Signed)
Name: Yesenia Carrillo MRN: [id number] DOB: Jun 26, 1995     CONSULTATION DATE: 11/05/2018  REFERRING MD :  Alvie Heidelberg  CHIEF COMPLAINT:  resp failure  STUDIES:    The CXR was Independently Reviewed By Me Today CXR reviewed-b/l infiltrtaes pneumonia  PREVIOUS ADMISSION TO [facility] 05/03/18 # ARDS secondary to community-acquired pneumonia  # Leukocytosis, presumed secondary to acute illness/inflammatory response  # Thrombocytosis, presumed secondary to acute illness/inflammation  # Acute pulmonary emboli, left segmental, 05/13/18 # Sinus tachycardia  # Retroperitoneal bleed, (4.8 x 0.4 x 10.6 cm, 11/29)      HISTORY OF PRESENT ILLNESS: 23 yo morbidly obese white female with severe resp failure With hypoxia Increased WOB High risk for death Needs emergent intubation Previous historyy of pneumonia /ARDS and ECMO at [facility]  Family requests transfer to [facility] :   has a past medical history of Hypertension.  has a past surgical history that includes Tonsillectomy and Wisdom tooth extraction. Prior to Admission medications   Medication Sig Start Date End Date Taking? Authorizing Provider  guaiFENesin-codeine 100-10 MG/5ML syrup Take 5 mLs by mouth every 6 (six) hours as needed for cough. 11/03/18  Yes Harvest Dark, MD  hydrochlorothiazide (MICROZIDE) 12.5 MG capsule Take 12.5 mg by mouth daily.   Yes [provider]   Allergies  Allergen Reactions  . Ampicillin Rash and Other (See Comments)    Patient and family cannot remember the specifics of the reaction.  Marland Kitchen Penicillins Hives    FAMILY HISTORY:  family history is not on file. SOCIAL HISTORY:  reports that she has been smoking cigars. She has never used smokeless tobacco. She reports current alcohol use. She reports that she does not use drugs.  REVIEW OF SYSTEMS:   Unable to obtain due to critical illness   VITAL SIGNS: Temp:  [98.5 F (36.9 C)-100.9 F (38.3 C)] 99.6 F (37.6 C) (06/04  0525) Pulse Rate:  [55-128] 128 (06/04 0705) Resp:  [20-35] 35 (06/04 0525) BP: (134-171)/(84-124) 171/124 (06/04 0705) SpO2:  [89 %-99 %] 91 % (06/04 0902) FiO2 (%):  [100 %] 100 % (06/04 0902)   I/O last 3 completed shifts: In: 3782 [I.V.:1744; IV Piggyback:2038] Out: 700 [Urine:700] No intake/output data recorded.   SpO2: 91 % O2 Flow Rate (L/min): 10 L/min FiO2 (%): 100 %   Physical Examination:  GENERAL:critically ill appearing, +resp distress HEAD: Normocephalic, atraumatic.  EYES: Pupils equal, round, reactive to light.  No scleral icterus.  MOUTH: Moist mucosal membrane. NECK: Supple. No JVD.  PULMONARY: +rhonchi, +wheezing CARDIOVASCULAR: S1 and S2. Regular rate and rhythm. No murmurs, rubs, or gallops.  GASTROINTESTINAL: Soft, nontender, -distended. No masses. Positive bowel sounds. No hepatosplenomegaly.  MUSCULOSKELETAL: No swelling, clubbing, or edema.  NEUROLOGIC: obtunded SKIN:intact,warm,dry    MEDICATIONS: I have reviewed all medications and confirmed regimen as documented   CULTURE RESULTS   Recent Results (from the past 240 hour(s))  Group A Strep by PCR     Status: None   Collection Time: 11/03/18  6:11 PM  Result Value Ref Range Status   Group A Strep by PCR NOT DETECTED NOT DETECTED Final    Comment: Performed at [facility], [address] [place], [place] [address]  SARS Coronavirus 2 (CEPHEID- Performed in [place] hospital lab), Hosp Order     Status: None   Collection Time: 11/03/18  6:39 PM  Result Value Ref Range Status   SARS Coronavirus 2 NEGATIVE NEGATIVE Final  Comment: (NOTE) If result is NEGATIVE SARS-CoV-2 target nucleic acids are NOT DETECTED. The SARS-CoV-2 RNA is generally detectable in upper and lower  respiratory specimens during the acute phase of infection. The lowest  concentration of SARS-CoV-2 viral copies this assay can detect is 250  copies / mL. A negative result does not preclude SARS-CoV-2  infection  and should not be used as the sole basis for treatment or other  patient management decisions.  A negative result may occur with  improper specimen collection / handling, submission of specimen other  than nasopharyngeal swab, presence of viral mutation(s) within the  areas targeted by this assay, and inadequate number of viral copies  (<250 copies / mL). A negative result must be combined with clinical  observations, patient history, and epidemiological information. If result is POSITIVE SARS-CoV-2 target nucleic acids are DETECTED. The SARS-CoV-2 RNA is generally detectable in upper and lower  respiratory specimens dur ing the acute phase of infection.  Positive  results are indicative of active infection with SARS-CoV-2.  Clinical  correlation with patient history and other diagnostic information is  necessary to determine patient infection status.  Positive results do  not rule out bacterial infection or co-infection with other viruses. If result is PRESUMPTIVE POSTIVE SARS-CoV-2 nucleic acids MAY BE PRESENT.   A presumptive positive result was obtained on the submitted specimen  and confirmed on repeat testing.  While 2019 novel coronavirus  (SARS-CoV-2) nucleic acids may be present in the submitted sample  additional confirmatory testing may be necessary for epidemiological  and / or clinical management purposes  to differentiate between  SARS-CoV-2 and other Sarbecovirus currently known to infect humans.  If clinically indicated additional testing with an alternate test  methodology [phone]) is advised. The SARS-CoV-2 RNA is generally  detectable in upper and lower respiratory sp ecimens during the acute  phase of infection. The expected result is Negative. Fact Sheet for Patients:  StrictlyIdeas.no Fact Sheet for Healthcare Providers: BankingDealers.co.za This test is not yet approved or cleared by the [place]  FDA and has been authorized for detection and/or diagnosis of SARS-CoV-2 by FDA under an Emergency Use Authorization (EUA).  This EUA will remain in effect (meaning this test can be used) for the duration of the COVID-19 declaration under Section 564(b)(1) of the Act, 21 U.S.C. section 360bbb-3(b)(1), unless the authorization is terminated or revoked sooner. Performed at [facility], [address] [place], [place] [address]   Blood culture (routine x 2)     Status: None (Preliminary result)   Collection Time: 11/05/18 12:10 PM  Result Value Ref Range Status   Specimen Description BLOOD LEFT ANTECUBITAL  Final   Special Requests   Final    BOTTLES DRAWN AEROBIC AND ANAEROBIC Blood Culture adequate volume   Culture   Final    NO GROWTH 2 DAYS Performed at [facility], [address] [place], [place] [address]    Report Status PENDING  Incomplete  Blood culture (routine x 2)     Status: None (Preliminary result)   Collection Time: 11/05/18 12:20 PM  Result Value Ref Range Status   Specimen Description BLOOD BLOOD LEFT WRIST  Final   Special Requests   Final    BOTTLES DRAWN AEROBIC AND ANAEROBIC Blood Culture adequate volume   Culture   Final    NO GROWTH 2 DAYS Performed at [facility], [address] [place], [place] [address]    Report Status PENDING  Incomplete  Urine culture     Status: Abnormal   Collection Time: 11/05/18 12:20 PM  Result Value Ref Range Status   Specimen Description   Final    URINE, CLEAN CATCH Performed at [facility], [address] [place], [place] [address]    Special Requests   Final    Normal Performed at [facility], [place], [place] [address]    Culture >=100,000 COLONIES/mL KLEBSIELLA PNEUMONIAE (A)  Final   Report Status 11/07/2018 FINAL  Final   Organism ID, Bacteria KLEBSIELLA PNEUMONIAE (A)  Final      Susceptibility   Klebsiella pneumoniae - MIC*     AMPICILLIN >=32 RESISTANT Resistant     CEFAZOLIN <=4 SENSITIVE Sensitive     CEFTRIAXONE <=1 SENSITIVE Sensitive     CIPROFLOXACIN <=0.25 SENSITIVE Sensitive     GENTAMICIN <=1 SENSITIVE Sensitive     IMIPENEM <=0.25 SENSITIVE Sensitive     NITROFURANTOIN 32 SENSITIVE Sensitive     TRIMETH/SULFA <=20 SENSITIVE Sensitive     AMPICILLIN/SULBACTAM 8 SENSITIVE Sensitive     PIP/TAZO <=4 SENSITIVE Sensitive     Extended ESBL NEGATIVE Sensitive     * >=100,000 COLONIES/mL KLEBSIELLA PNEUMONIAE  SARS Coronavirus 2 (CEPHEID- Performed in [place] hospital lab), Hosp Order     Status: None   Collection Time: 11/05/18 12:20 PM  Result Value Ref Range Status   SARS Coronavirus 2 NEGATIVE NEGATIVE Final    Comment: (NOTE) If result is NEGATIVE SARS-CoV-2 target nucleic acids are NOT DETECTED. The SARS-CoV-2 RNA is generally detectable in upper and lower  respiratory specimens during the acute phase of infection. The lowest  concentration of SARS-CoV-2 viral copies this assay can detect is 250  copies / mL. A negative result does not preclude SARS-CoV-2 infection  and should not be used as the sole basis for treatment or other  patient management decisions.  A negative result may occur with  improper specimen collection / handling, submission of specimen other  than nasopharyngeal swab, presence of viral mutation(s) within the  areas targeted by this assay, and inadequate number of viral copies  (<250 copies / mL). A negative result must be combined with clinical  observations, patient history, and epidemiological information. If result is POSITIVE SARS-CoV-2 target nucleic acids are DETECTED. The SARS-CoV-2 RNA is generally detectable in upper and lower  respiratory specimens dur ing the acute phase of infection.  Positive  results are indicative of active infection with SARS-CoV-2.  Clinical  correlation with patient history and other diagnostic information is  necessary to determine  patient infection status.  Positive results do  not rule out bacterial infection or co-infection with other viruses. If result is PRESUMPTIVE POSTIVE SARS-CoV-2 nucleic acids MAY BE PRESENT.   A presumptive positive result was obtained on the submitted specimen  and confirmed on repeat testing.  While 2019 novel coronavirus  (SARS-CoV-2) nucleic acids may be present in the submitted sample  additional confirmatory testing may be necessary for epidemiological  and / or clinical management purposes  to differentiate between  SARS-CoV-2 and other Sarbecovirus currently known to infect humans.  If clinically indicated additional testing with an alternate test  methodology [phone]) is advised. The SARS-CoV-2 RNA is generally  detectable in upper and lower respiratory sp ecimens during the acute  phase of infection. The expected result is Negative. Fact Sheet for Patients:  StrictlyIdeas.no Fact Sheet for Healthcare Providers: BankingDealers.co.za This test is not yet approved or cleared by the  [place] Architectural technologist and has been authorized for detection and/or diagnosis of SARS-CoV-2 by FDA under an Print production planner (EUA).  This EUA will remain in effect (meaning this test can be used) for the duration of the COVID-19 declaration under Section 564(b)(1) of the Act, 21 U.S.C. section 360bbb-3(b)(1), unless the authorization is terminated or revoked sooner. Performed at [facility], [address] [place], [place] [address]   Respiratory Panel by PCR     Status: None   Collection Time: 11/05/18  6:02 PM  Result Value Ref Range Status   Adenovirus NOT DETECTED NOT DETECTED Final   Coronavirus 229E NOT DETECTED NOT DETECTED Final    Comment: (NOTE) The Coronavirus on the Respiratory Panel, DOES NOT test for the novel  Coronavirus (2019 nCoV)    Coronavirus HKU1 NOT DETECTED NOT DETECTED Final   Coronavirus NL63 NOT  DETECTED NOT DETECTED Final   Coronavirus OC43 NOT DETECTED NOT DETECTED Final   Metapneumovirus NOT DETECTED NOT DETECTED Final   Rhinovirus / Enterovirus NOT DETECTED NOT DETECTED Final   Influenza A NOT DETECTED NOT DETECTED Final   Influenza B NOT DETECTED NOT DETECTED Final   Parainfluenza Virus 1 NOT DETECTED NOT DETECTED Final   Parainfluenza Virus 2 NOT DETECTED NOT DETECTED Final   Parainfluenza Virus 3 NOT DETECTED NOT DETECTED Final   Parainfluenza Virus 4 NOT DETECTED NOT DETECTED Final   Respiratory Syncytial Virus NOT DETECTED NOT DETECTED Final   Bordetella pertussis NOT DETECTED NOT DETECTED Final   Chlamydophila pneumoniae NOT DETECTED NOT DETECTED Final   Mycoplasma pneumoniae NOT DETECTED NOT DETECTED Final    Comment: Performed at [facility], [place]. [address], [place], [place] [address]  MRSA PCR Screening     Status: None   Collection Time: 11/07/18  7:29 AM  Result Value Ref Range Status   MRSA by PCR NEGATIVE NEGATIVE Final    Comment:        The GeneXpert MRSA Assay (FDA approved for NASAL specimens only), is one component of a comprehensive MRSA colonization surveillance program. It is not intended to diagnose MRSA infection nor to guide or monitor treatment for MRSA infections. Performed at [facility], [address] [place], [place] [address]         CBC    Component Value Date/Time   WBC 17.3 (H) 11/07/2018 0259   RBC 3.87 11/07/2018 0259   HGB 10.5 (L) 11/07/2018 0259   HGB 12.4 06/06/2013 2001   HCT 31.9 (L) 11/07/2018 0259   HCT 36.3 06/06/2013 2001   PLT 285 11/07/2018 0259   PLT 356 06/06/2013 2001   MCV 82.4 11/07/2018 0259   MCV 78 (L) 06/06/2013 2001   MCH 27.1 11/07/2018 0259   MCHC 32.9 11/07/2018 0259   RDW 15.3 11/07/2018 0259   RDW 14.6 (H) 06/06/2013 2001   LYMPHSABS 1.8 11/05/2018 1210   LYMPHSABS 2.5 06/06/2013 2001   MONOABS 1.0 11/05/2018 1210   MONOABS 1.4 (H) 06/06/2013 2001   EOSABS 0.2  11/05/2018 1210   EOSABS 0.1 06/06/2013 2001   BASOSABS 0.0 11/05/2018 1210   BASOSABS 0.1 06/06/2013 2001   BMP Latest Ref Rng & Units 11/07/2018 11/06/2018 11/05/2018  Glucose 70 - 99 mg/dL 104(H) 114(H) 103(H)  BUN 6 - 20 mg/dL 8 8 11   Creatinine 0.44 - 1.00 mg/dL 0.47 0.50 0.69  Sodium 135 - 145 mmol/L 139 136 137  Potassium 3.5 - 5.1 mmol/L 3.4(L) 3.6 3.7  Chloride 98 -  111 mmol/L 103 103 102  CO2 22 - 32 mmol/L 27 26 26   Calcium 8.9 - 10.3 mg/dL 8.8(L) 8.2(L) 8.9       IMAGING    Dg Chest 1 View  Result Date: 11/06/2018 CLINICAL DATA:  Chest pain. EXAM: CHEST  1 VIEW COMPARISON:  11/05/2018. FINDINGS: Marked worsening aeration. Cardiomegaly with BILATERAL pulmonary opacities either represents ARDS, pulmonary edema or significant progression of pneumonia. IMPRESSION: Marked worsening aeration. Electronically Signed   By: Staci Righter M.D.   On: 11/06/2018 21:22   Dg Chest Port 1 View  Result Date: 11/07/2018 CLINICAL DATA:  Endotracheal and NG placement. EXAM: PORTABLE CHEST 1 VIEW COMPARISON:  11/06/2018. FINDINGS: Endotracheal tube noted with its tip 1.4 cm above the carina. NG tube tip noted below left hemidiaphragm. Stable cardiomegaly. Unchanged diffuse bilateral pulmonary infiltrates. Small pleural effusions cannot be excluded. No pneumothorax. IMPRESSION: 1. Endotracheal tube tip 1.4 cm above the carina. Proximal repositioning of approximately 2 cm should be considered. NG tube tip noted below left hemidiaphragm. 2.  Stable cardiomegaly. 3. Unchanged diffuse severe bilateral pulmonary infiltrates. Small bilateral pleural effusions cannot be excluded. Electronically Signed   By: Marcello Moores  Register   On: 11/07/2018 10:34   Dg Chest Port 1 View  Result Date: 11/07/2018 CLINICAL DATA:  Clotted central line EXAM: PORTABLE CHEST 1 VIEW COMPARISON:  Earlier today FINDINGS: Right IJ line with tip at the upper cavoatrial junction. Endotracheal tube with tip 2 cm above the carina. The  orogastric tube at least reaches the upper stomach. Cardiomegaly. Low lung volumes with diffuse airspace opacity. No evidence of pneumothorax. IMPRESSION: 1. Unremarkable right IJ catheter. 2. Unchanged severe airspace disease. Electronically Signed   By: Monte Fantasia M.D.   On: 11/07/2018 10:29   Dg Abd Portable 1v  Result Date: 11/07/2018 CLINICAL DATA:  NG and ET tube placement. EXAM: PORTABLE ABDOMEN - 1 VIEW COMPARISON:  Chest x-ray same day. FINDINGS: NG tube noted with tip over the stomach. No bowel distention. No free air. Bilateral pulmonary infiltrates again noted. IMPRESSION: Number NG tube noted with tip over the stomach. No bowel distention. 2.  Bilateral pulmonary infiltrates again noted. Electronically Signed   By: Marcello Moores  Register   On: 11/07/2018 11:17    Current Facility-Administered Medications:  .  acetaminophen (TYLENOL) tablet 650 mg, 650 mg, Oral, Q6H PRN, Lance Coon, MD, 650 mg at 11/06/18 2039 .  albuterol (PROVENTIL) (2.5 MG/3ML) 0.083% nebulizer solution 2.5 mg, 2.5 mg, Nebulization, Q4H, Jordin Dambrosio, MD, 2.5 mg at 11/07/18 0902 .  ceFEPIme (MAXIPIME) 2 g in sodium chloride 0.9 % 100 mL IVPB, 2 g, Intravenous, Q8H, Mayo, Pete Pelt, MD, Last Rate: 200 mL/hr at 11/07/18 0610, 2 g at 11/07/18 0610 .  docusate (COLACE) 50 MG/5ML liquid 100 mg, 100 mg, Per Tube, BID PRN, Mortimer Fries, Cincere Deprey, MD .  enoxaparin (LOVENOX) injection 40 mg, 40 mg, Subcutaneous, Q24H, Ouma, Bing Neighbors, NP, 40 mg at 11/06/18 1323 .  famotidine (PEPCID) IVPB 20 mg premix, 20 mg, Intravenous, Q12H, Unnamed Zeien, MD, Last Rate: 100 mL/hr at 11/07/18 1030, 20 mg at 11/07/18 1030 .  fentaNYL (SUBLIMAZE) bolus via infusion 50 mcg, 50 mcg, Intravenous, Q15 min PRN, Mortimer Fries, Tyjay Galindo, MD .  fentaNYL 2541mcg in NS 267mL (69mcg/ml) infusion-PREMIX, 50-200 mcg/hr, Intravenous, Continuous, Ekaterini Capitano, MD, Last Rate: 10 mL/hr at 11/07/18 0900, 100 mcg/hr at 11/07/18 0900 .  fluticasone (FLONASE) 50 MCG/ACT nasal  spray 1 spray, 1 spray, Each Nare, Daily, Seals, Theo Dills, NP .  guaiFENesin-dextromethorphan (ROBITUSSIN DM) 100-10 MG/5ML syrup 5 mL, 5 mL, Oral, Q4H PRN, Ojie, Jude, MD, 5 mL at 11/06/18 0540 .  ibuprofen (ADVIL) tablet 400 mg, 400 mg, Oral, Q6H PRN, Stark Jock, Jude, MD, 400 mg at 11/06/18 2215 .  levalbuterol (XOPENEX) nebulizer solution 0.63 mg, 0.63 mg, Nebulization, Q8H PRN, Seals, Angela H, NP .  methylPREDNISolone sodium succinate (SOLU-MEDROL) 40 mg/mL injection 40 mg, 40 mg, Intravenous, Q12H, Sonya Pucci, MD .  ondansetron (ZOFRAN) injection 4 mg, 4 mg, Intravenous, Q6H PRN, Harrie Foreman, MD, 4 mg at 11/06/18 0453 .  potassium chloride (KLOR-CON) packet 40 mEq, 40 mEq, Per Tube, Q4H, Flora Lipps, MD, 40 mEq at 11/07/18 1116        ASSESSMENT AND PLAN SYNOPSIS  23 yo morbidly obese AAF with progressive resp failure progressive b/l infiltrates pneumonia/ARDS  Severe ACUTE Hypoxic and Hypercapnic Respiratory Failure -needs Full MV support Emergent intubation -Bronchodilator Therapy -Wean Fio2 and PEEP as tolerated    NEUROLOGY Obtunded  Needs emergent intubation   CARDIAC ICU monitoring  ID -continue IV abx as prescibed -follow up cultures  GI GI PROPHYLAXIS as indicated  NUTRITIONAL STATUS DIET-->NPO Constipation protocol as indicated   ENDO - will use ICU hypoglycemic\Hyperglycemia protocol if needed    ELECTROLYTES -follow labs as needed -replace as needed -pharmacy consultation and following   DVT/GI PRX ordered TRANSFUSIONS AS NEEDED MONITOR FSBS ASSESS the need for LABS as needed   Critical Care Time devoted to patient care services described in this note 32 minutes.   Overall, patient is critically ill, prognosis is guarded.   high risk for cardiac arrest and death.    Corrin Parker, M.D.  [facility]  Medical Director [place] Director [facility] Cardio-Pulmonary Department

## 2018-11-07 NOTE — Procedures (Signed)
Endotracheal Intubation: Patient required placement of an artificial airway secondary to Respiratory Failure  Consent: Emergent.   Hand washing performed prior to starting the procedure.   Medications administered for sedation prior to procedure:  Midazolam 4 mg IV,  Vecuronium 10 mg IV, Fentanyl 100 mcg IV.    A time out procedure was called and correct patient, name, & ID confirmed. Needed supplies and equipment were assembled and checked to include ETT, 10 ml syringe, Glidescope, Mac and Miller blades, suction, oxygen and bag mask valve, end tidal CO2 monitor.   Patient was positioned to align the mouth and pharynx to facilitate visualization of the glottis.   Heart rate, SpO2 and blood pressure was continuously monitored during the procedure. Pre-oxygenation was conducted prior to intubation and endotracheal tube was placed through the vocal cords into the trachea.     The artificial airway was placed under direct visualization via glidescope route using a 8.0 ETT on the first attempt.  ETT was secured at 23 cm mark.  Placement was confirmed by auscuitation of lungs with good breath sounds bilaterally and no stomach sounds.  Condensation was noted on endotracheal tube.   Pulse ox 78%.  CO2 detector in place with appropriate color change.   Complications: None .   Operator: Labrian Torregrossa.   Chest radiograph ordered and pending.    Corrin Parker, M.D.  [facility]  Medical Director [place] Director [facility] Cardio-Pulmonary Department

## 2018-11-07 NOTE — Progress Notes (Signed)
Patient intubated soon after arrival to ICU, urinary catheter, Orogastric tube, and triple lumen CVC also inserted.  Patient's mother Aniceto Boss requested transfer to [facility], patient stated to me before she received any sedating medications that she was ok with transferring to [facility], also.  Transfer okd by [facility] transfer center and report was called to [facility] on 6E.  [facility] team arrived approx 1115.  Aniceto Boss was called by me so she would know patient was leaving [facility] for [place].  Aniceto Boss did state that her daughter has been smoking cigarettes regularly since her last hospitalization and also "drinks a lot with her friends."

## 2018-11-07 NOTE — Progress Notes (Signed)
[place] [facility] [place] NAME: Besan Ketchem    MR#:  [id number]  DATE OF BIRTH:  05-Oct-1995  SUBJECTIVE:  CHIEF COMPLAINT:   Chief Complaint  Patient presents with  . Shortness of Breath   Patient became hypoxic this morning and went into respiratory failure and had to be transferred to the ICU.  Subsequently intubated in the ICU this morning prior to my arrival.  Given patient's history of respiratory failure requiring management with ECMO at [facility], ICU physician already discussed case with [facility] and patient being transferred to [facility].   REVIEW OF SYSTEMS:  Unobtainable due to patient being sedated on the vent  DRUG ALLERGIES:   Allergies  Allergen Reactions  . Ampicillin Rash and Other (See Comments)    Patient and family cannot remember the specifics of the reaction.  Marland Kitchen Penicillins Hives   VITALS:  Blood pressure (!) 109/40, pulse (!) 155, temperature 98.9 F (37.2 C), temperature source Oral, resp. rate (!) 38, height 5\' 1"  (1.549 m), weight 118.1 kg, last menstrual period 10/19/2018, SpO2 91 %. PHYSICAL EXAMINATION:  Physical Exam   GENERAL:  23 y.o.-year-old patient lying in the bed.  Intubated and sedated this morning EYES: Pupils equal, round, reactive to light and accommodation. No scleral icterus. Extraocular muscles intact.  HEENT: Head atraumatic, normocephalic. Oropharynx and nasopharynx clear.  NECK:  Supple, no jugular venous distention. No thyroid enlargement, no tenderness.  LUNGS: Mild rhonchi bilaterally.  No wheezing.  Patient now on the vent CARDIOVASCULAR: S1, S2 normal. No murmurs, rubs, or gallops.  ABDOMEN: Soft, nontender, nondistended. Bowel sounds present. No organomegaly or mass.  EXTREMITIES: No pedal edema, cyanosis, or clubbing.  NEUROLOGIC ;sedated on the vent PSYCHIATRIC: Sedated on the vent SKIN: No obvious rash, lesion, or ulcer.  LABORATORY PANEL:  Female CBC Recent Labs   Lab 11/07/18 0259  WBC 17.3*  HGB 10.5*  HCT 31.9*  PLT 285   ------------------------------------------------------------------------------------------------------------------ Chemistries  Recent Labs  Lab 11/06/18 0645 11/07/18 0259  NA 136 139  K 3.6 3.4*  CL 103 103  CO2 26 27  GLUCOSE 114* 104*  BUN 8 8  CREATININE 0.50 0.47  CALCIUM 8.2* 8.8*  MG  --  2.0  AST 62*  --   ALT 49*  --   ALKPHOS 47  --   BILITOT 1.1  --    RADIOLOGY:  Dg Chest 1 View  Result Date: 11/06/2018 CLINICAL DATA:  Chest pain. EXAM: CHEST  1 VIEW COMPARISON:  11/05/2018. FINDINGS: Marked worsening aeration. Cardiomegaly with BILATERAL pulmonary opacities either represents ARDS, pulmonary edema or significant progression of pneumonia. IMPRESSION: Marked worsening aeration. Electronically Signed   By: Staci Righter M.D.   On: 11/06/2018 21:22   Dg Chest Port 1 View  Result Date: 11/07/2018 CLINICAL DATA:  Endotracheal and NG placement. EXAM: PORTABLE CHEST 1 VIEW COMPARISON:  11/06/2018. FINDINGS: Endotracheal tube noted with its tip 1.4 cm above the carina. NG tube tip noted below left hemidiaphragm. Stable cardiomegaly. Unchanged diffuse bilateral pulmonary infiltrates. Small pleural effusions cannot be excluded. No pneumothorax. IMPRESSION: 1. Endotracheal tube tip 1.4 cm above the carina. Proximal repositioning of approximately 2 cm should be considered. NG tube tip noted below left hemidiaphragm. 2.  Stable cardiomegaly. 3. Unchanged diffuse severe bilateral pulmonary infiltrates. Small bilateral pleural effusions cannot be excluded. Electronically Signed   By: Marcello Moores  Register   On: 11/07/2018 10:34   Dg Chest Port 1 [address]  Result Date: 11/07/2018 CLINICAL DATA:  Clotted central line EXAM: PORTABLE CHEST 1 VIEW COMPARISON:  Earlier today FINDINGS: Right IJ line with tip at the upper cavoatrial junction. Endotracheal tube with tip 2 cm above the carina. The orogastric tube at least reaches the upper  stomach. Cardiomegaly. Low lung volumes with diffuse airspace opacity. No evidence of pneumothorax. IMPRESSION: 1. Unremarkable right IJ catheter. 2. Unchanged severe airspace disease. Electronically Signed   By: Monte Fantasia M.D.   On: 11/07/2018 10:29   Dg Abd Portable 1v  Result Date: 11/07/2018 CLINICAL DATA:  NG and ET tube placement. EXAM: PORTABLE ABDOMEN - 1 VIEW COMPARISON:  Chest x-ray same day. FINDINGS: NG tube noted with tip over the stomach. No bowel distention. No free air. Bilateral pulmonary infiltrates again noted. IMPRESSION: Number NG tube noted with tip over the stomach. No bowel distention. 2.  Bilateral pulmonary infiltrates again noted. Electronically Signed   By: Marcello Moores  Register   On: 11/07/2018 11:17   ASSESSMENT AND PLAN:   23 y.o. 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  1. Sepsis -likely secondary to pneumonia and UTI (patient presenting with fever 102.9, sats 85, tachycardia 147, tachypnea, and elevated WBC) - Prior hx of ARDS secondary to pneumonia requiring ECMO support in the past at [facility]. SARS Coronavirus 2 negative - Ultrasound of right upper quadrant negative for acute finding - Chest x-ray shows persistent upper mediastinal fullness unable to exclude mediastinal large CT chest done revealed multifocal pneumonia.  No pulmonary embolism. Patient already on broad-spectrum IV antibiotics with vancomycin and cefepime.   This morning patient was transferred to ICU due to worsening respiratory status and hypoxia.  Had to be intubated.  Being discharged on transfer to [facility] for further evaluation and management due to anticipation that patient would likely require ECMO. Discharge summary done by ICU physician Dr. Mortimer Fries To [facility]  once bed available  2.  Community-acquired pneumonia- presenting with fever, leukocytosis - Chest x-ray as above - Empiric antibiotics with cefepime and vancomycin as above.  IV fluid hydration.  To [facility]  3. UTI - UA shows positive for UTI Continue back in a biotics including cefepime pending results of urine cultures  4. HTN + Goal BP <140/90 - Hold HCTZ due to  Sepsis as above  5.  Acute hypoxic respiratory failure secondary to multifocal pneumonia Patient now intubated and sedated on the vent.    DVT prophylaxis -Lovenox    All the records are reviewed and case discussed with Care Management/Social Worker. Management plans discussed with the patient, family and they are in agreement.  CODE STATUS: Full Code  TOTAL TIME TAKING CARE OF THIS PATIENT: 35 minutes.   More than 50% of the time was spent in counseling/coordination of care: Melina Copa M.D on 11/07/2018 at 1:42 PM  Between 7am to 6pm - Pager - [phone]  After 6pm go to [email or web address] - Technical brewer [facility]  Office  [phone]  CC: Primary care physician; [facility], [place]  Note: This dictation was prepared with Diplomatic Services operational officer dictation along with smaller phrase technology. Any transcriptional errors that result from this process are unintentional.

## 2018-11-07 NOTE — Progress Notes (Signed)
Notified of Heart rate 129 and respiratory rate 35-40.  Evaluated patient at bedside.  Patient has high flow oxygen at 10 L/min.  She does not appear to be in any acute distress.  She denies chest pain.  She has a nonproductive cough.  Arterial blood gas obtained demonstrating pH 7.39/PCO2 48/PO2 68/O2 saturation 93.1/HCO3 29.1.  On physical exam, faint bilateral rales in upper lung fields with diminished lung sounds to lower lung fields bilaterally.  Frontal and maxillary sinus tenderness noted.  We will continue high flow oxygen at 10 L/min.  Patient has received Cardizem 5 mg IV 10 minutes prior.  Continue to monitor.  Xopenex nebulized treatment now.  Flonase nasal spray daily.

## 2018-11-07 NOTE — Procedures (Signed)
Central Venous Catheter Placement:TRIPLE LUMEN Indication: Patient receiving vesicant or irritant drug.; Patient receiving intravenous therapy for longer than 5 days.; Patient has limited or no vascular access.   Consent:emergent    Hand washing performed prior to starting the procedure.   Procedure:   An active timeout was performed and correct patient, name, & ID confirmed.   Patient was positioned correctly for central venous access.  Patient was prepped using strict sterile technique including chlorohexadine preps, sterile drape, sterile gown and sterile gloves.    The area was prepped, draped and anesthetized in the usual sterile manner. Patient comfort was obtained.    A triple lumen catheter was placed in RT  Internal Jugular Vein There was good blood return, catheter caps were placed on lumens, catheter flushed easily, the line was secured and a sterile dressing and BIO-PATCH applied.   Ultrasound was used to visualize vasculature and guidance of needle.   Number of Attempts: 1 Complications:none Estimated Blood Loss: none Chest Radiograph indicated and ordered.  Operator: Bhavana Kady.   Baleigh Rennaker David Yolande Skoda, M.D.  [facility]  Medical Director ICU-[facility] [place] Medical Director [facility] Cardio-Pulmonary Department     

## 2018-11-08 MED ORDER — TISIT EX
0.10 | CUTANEOUS | Status: DC
Start: ? — End: 2018-11-08

## 2018-11-08 MED ORDER — TRIAMINIC COUGH/SORE THROAT PO
5.00 | ORAL | Status: DC
Start: 2018-11-08 — End: 2018-11-08

## 2018-11-08 MED ORDER — ONDANSETRON HCL 4 MG/2ML IJ SOLN
4.00 | INTRAMUSCULAR | Status: DC
Start: ? — End: 2018-11-08

## 2018-11-08 MED ORDER — FAMOTIDINE 20 MG PO TABS
20.00 | ORAL_TABLET | ORAL | Status: DC
Start: 2018-11-09 — End: 2018-11-08

## 2018-11-08 MED ORDER — HYDROMORPHONE HCL 1 MG/ML IJ SOLN
.25 | INTRAMUSCULAR | Status: DC
Start: ? — End: 2018-11-08

## 2018-11-08 MED ORDER — GENERIC EXTERNAL MEDICATION
Status: DC
Start: ? — End: 2018-11-08

## 2018-11-08 MED ORDER — ENOXAPARIN SODIUM 40 MG/0.4ML ~~LOC~~ SOLN
40.00 | SUBCUTANEOUS | Status: DC
Start: 2018-11-08 — End: 2018-11-08

## 2018-11-08 MED ORDER — Medication
2.00 | Status: DC
Start: 2018-11-09 — End: 2018-11-08

## 2018-11-08 MED ORDER — ACETAMINOPHEN 325 MG PO TABS
975.00 | ORAL_TABLET | ORAL | Status: DC
Start: 2018-11-09 — End: 2018-11-08

## 2018-11-08 MED ORDER — NALOXONE HCL 0.4 MG/ML IJ SOLN [OVERRIDE RECORD] [AGE NEONATE]
0.01 | Status: DC
Start: ? — End: 2018-11-08

## 2018-11-08 MED ORDER — DEXTROSE 10 % IV SOLN
50.00 | INTRAVENOUS | Status: DC
Start: ? — End: 2018-11-08

## 2018-11-10 LAB — CULTURE, BLOOD (ROUTINE X 2)
Culture: NO GROWTH
Culture: NO GROWTH
Special Requests: ADEQUATE
Special Requests: ADEQUATE

## 2019-03-31 ENCOUNTER — Other Ambulatory Visit: Payer: Self-pay

## 2019-03-31 ENCOUNTER — Encounter: Payer: Self-pay | Admitting: Emergency Medicine

## 2019-03-31 ENCOUNTER — Emergency Department: Payer: Self-pay

## 2019-03-31 ENCOUNTER — Emergency Department
Admission: EM | Admit: 2019-03-31 | Discharge: 2019-03-31 | Disposition: A | Discharge status: Home / Self Care | Payer: Self-pay | Attending: Emergency Medicine | Admitting: Emergency Medicine

## 2019-03-31 DIAGNOSIS — R030 Elevated blood-pressure reading, without diagnosis of hypertension: Secondary | ICD-10-CM | POA: Insufficient documentation

## 2019-03-31 DIAGNOSIS — F17290 Nicotine dependence, other tobacco product, uncomplicated: Secondary | ICD-10-CM | POA: Insufficient documentation

## 2019-03-31 DIAGNOSIS — Z79899 Other long term (current) drug therapy: Secondary | ICD-10-CM | POA: Insufficient documentation

## 2019-03-31 DIAGNOSIS — J069 Acute upper respiratory infection, unspecified: Secondary | ICD-10-CM | POA: Insufficient documentation

## 2019-03-31 DIAGNOSIS — I10 Essential (primary) hypertension: Secondary | ICD-10-CM | POA: Insufficient documentation

## 2019-03-31 MED ORDER — GUAIFENESIN-CODEINE 100-10 MG/5ML PO SOLN: 5.0000 mL | Freq: Four times a day (QID) | ORAL | 0 refills | Status: DC | PRN

## 2019-03-31 NOTE — Discharge Instructions (Signed)
Call make an appointment with your primary care provider to have your blood pressure rechecked.  Both times your blood pressure was checked in the ED it was elevated.  You may need to be placed on your blood pressure medication again.  Take Tylenol or ibuprofen as needed for body aches, headache, fever

## 2019-03-31 NOTE — ED Triage Notes (Signed)
Pt reports started with an aggravating cough last week and its not going away. Pt reports unsure if she is coughing anything up and denies fevers, bodyaches or other symptoms.

## 2019-03-31 NOTE — ED Provider Notes (Signed)
[facility] Emergency Department Provider Note  ____________________________________________   First MD Initiated Contact with Patient 03/31/19 1606     (approximate)  I have reviewed the triage vital signs and the nursing notes.   HISTORY  Chief Complaint Cough   HPI Yesenia Carrillo is a 23 y.o. female presents to the ED with a nonproductive cough for 1 week.  Patient denies any fever.  She also denies any body aches, change in taste or smell, vomiting or diarrhea.  She denies any known Covid exposure.  She has been taking over-the-counter cough medication with some minimal improvement.  Patient's blood pressure was elevated in triage.  Patient states that she was taken off of her blood pressure medication several years ago and has not seen her PCP in "a long time".     Past Medical History:  Diagnosis Date  . Hypertension     Patient Active Problem List   Diagnosis Date Noted  . Sepsis ([place]) 11/05/2018  . Acute respiratory failure with hypoxia ([place])   . CAP (community acquired pneumonia) 04/30/2018    Past Surgical History:  Procedure Laterality Date  . TONSILLECTOMY    . WISDOM TOOTH EXTRACTION      Prior to Admission medications   Medication Sig Start Date End Date Taking? Authorizing Provider  acetaminophen (TYLENOL) 325 MG tablet Take 2 tablets (650 mg total) by mouth every 6 (six) hours as needed for moderate pain (headache). 11/07/18   Awilda Bill, NP  albuterol (PROVENTIL) (2.5 MG/3ML) 0.083% nebulizer solution Take 3 mLs (2.5 mg total) by nebulization every 4 (four) hours. 11/07/18   Awilda Bill, NP  ceFEPIme 2 g in sodium chloride 0.9 % 100 mL Inject 2 g into the vein every 8 (eight) hours. 11/07/18   Awilda Bill, NP  docusate (COLACE) 50 MG/5ML liquid Place 10 mLs (100 mg total) into feeding tube 2 (two) times daily as needed for mild constipation. 11/07/18   Awilda Bill, NP  fentaNYL (SUBLIMAZE) SOLN Inject 50 mcg into the  vein every 15 (fifteen) minutes as needed (to maintain RASS & CPOT goal.). 11/07/18   Awilda Bill, NP  fentaNYL 10 mcg/ml SOLN infusion Inject 50-200 mcg/hr into the vein continuous. 11/07/18   Awilda Bill, NP  fluticasone (FLONASE) 50 MCG/ACT nasal spray Place 1 spray into both nostrils daily. 11/08/18   Awilda Bill, NP  guaiFENesin-codeine 100-10 MG/5ML syrup Take 5 mLs by mouth every 6 (six) hours as needed for cough. 03/31/19   Johnn Hai, PA-C  ibuprofen (ADVIL) 400 MG tablet Take 1 tablet (400 mg total) by mouth every 6 (six) hours as needed for fever or mild pain. 11/07/18   Awilda Bill, NP  levalbuterol (XOPENEX) 0.63 MG/3ML nebulizer solution Take 3 mLs (0.63 mg total) by nebulization every 8 (eight) hours as needed for wheezing or shortness of breath. 11/07/18   Awilda Bill, NP  potassium chloride (KLOR-CON) 20 MEQ packet Place 40 mEq into feeding tube every 4 (four) hours. 11/07/18   Awilda Bill, NP    Allergies Ampicillin and Penicillins  No family history on file.  Social History Social History   Tobacco Use  . Smoking status: Current Every Day Smoker    Types: Cigars  . Smokeless tobacco: Never Used  Substance Use Topics  . Alcohol use: Yes    Comment: monthly  . Drug use: No    Review of Systems Constitutional: No fever/chills Eyes:  No visual changes. ENT: No sore throat. Cardiovascular: Denies chest pain. Respiratory: Denies shortness of breath.  Positive for nonproductive cough. Gastrointestinal: No abdominal pain.  No nausea, no vomiting.  No diarrhea.  Genitourinary: Negative for dysuria. Musculoskeletal: Negative for muscle aches. Skin: Negative for rash. Neurological: Negative for headaches, focal weakness or numbness. ___________________________________________   PHYSICAL EXAM:  VITAL SIGNS: ED Triage Vitals  Enc Vitals Group     BP 03/31/19 1535 (!) 149/95     Pulse Rate 03/31/19 1535 (!) 116     Resp 03/31/19 1535 20      Temp 03/31/19 1535 98.6 F (37 C)     Temp Source 03/31/19 1535 Oral     SpO2 03/31/19 1535 96 %     Weight 03/31/19 1533 250 lb (113.4 kg)     Height 03/31/19 1533 5\' 2"  (1.575 m)     Head Circumference --      Peak Flow --      Pain Score 03/31/19 1533 0     Pain Loc --      Pain Edu? --      Excl. in [place]? --    Constitutional: Alert and oriented. Well appearing and in no acute distress.  Obese. Eyes: Conjunctivae are normal.  Head: Atraumatic. Neck: No stridor.   Cardiovascular: Normal rate, regular rhythm. Grossly normal heart sounds.  Good peripheral circulation. Respiratory: Normal respiratory effort.  No retractions. Lungs CTAB. Gastrointestinal: Soft and nontender. No distention.  Musculoskeletal: Moves upper and lower extremities without any difficulty.  Normal gait was noted. Neurologic:  Normal speech and language. No gross focal neurologic deficits are appreciated. No gait instability. Skin:  Skin is warm, dry and intact. No rash noted. Psychiatric: Mood and affect are normal. Speech and behavior are normal.  ____________________________________________   LABS (all labs ordered are listed, but only abnormal results are displayed)  Labs Reviewed - No data to display  RADIOLOGY   Official radiology report(s): Dg Chest Portable 1 View  Result Date: 03/31/2019 CLINICAL DATA:  Cough for 2 days.  Intermittent shortness of breath EXAM: PORTABLE CHEST 1 VIEW COMPARISON:  November 07, 2018. FINDINGS: There is no edema or consolidation. Heart is mildly enlarged with pulmonary vascularity normal. No adenopathy. No bone lesions. IMPRESSION: No edema or consolidation.  Mild cardiac enlargement. Electronically Signed   By: Lowella Grip III M.D.   On: 03/31/2019 16:40    ____________________________________________   PROCEDURES  Procedure(s) performed (including Critical Care):  Procedures ____________________________________________   INITIAL IMPRESSION / ASSESSMENT  AND PLAN / ED COURSE  As part of my medical decision making, I reviewed the following data within the electronic MEDICAL RECORD NUMBER Notes from prior ED visits and [place] Controlled Substance Database  Lafern L Maus was evaluated in Emergency Department on 03/31/2019 for the symptoms described in the history of present illness. She was evaluated in the context of the global COVID-19 pandemic, which necessitated consideration that the patient might be at risk for infection with the SARS-CoV-2 virus that causes COVID-19. Institutional protocols and algorithms that pertain to the evaluation of patients at risk for COVID-19 are in a state of rapid change based on information released by regulatory bodies including the CDC and federal and state organizations. These policies and algorithms were followed during the patient's care in the ED.  23 year old female presents to the ED with complaint of a nonproductive cough for 1 week.  She denies any fever, chills, nausea or vomiting.  Patient states her  appetite is same and there is been no change in smell or taste.  She denies any exposure to Covid.  Chest x-ray showed slight enlargement of her heart which may be due to hypertension that is not being treated.  Patient is blood pressure was elevated and patient currently is not taking blood pressure medication as she states her PCP took her off of it.  A prescription for guaifenesin with codeine was sent to her pharmacy.  She is encouraged to call and make an appointment with her PCP for recheck of her blood pressure. ____________________________________________   FINAL CLINICAL IMPRESSION(S) / ED DIAGNOSES  Final diagnoses:  Viral URI with cough  Elevated blood pressure reading     ED Discharge Orders         Ordered    guaiFENesin-codeine 100-10 MG/5ML syrup  Every 6 hours PRN     03/31/19 1716           Note:  This document was prepared using Dragon voice recognition software and may include  unintentional dictation errors.    Johnn Hai, PA-C 03/31/19 1749    Delman Kitten, MD 04/03/19 [phone]

## 2019-03-31 NOTE — ED Notes (Signed)
See triage note  Presents with a 1 week hx of dry cough  No fever

## 2019-04-06 DIAGNOSIS — U071 COVID-19: Secondary | ICD-10-CM

## 2019-04-06 HISTORY — DX: COVID-19: U07.1

## 2019-04-14 ENCOUNTER — Encounter: Payer: Self-pay | Admitting: Emergency Medicine

## 2019-04-14 ENCOUNTER — Emergency Department
Admission: EM | Admit: 2019-04-14 | Discharge: 2019-04-14 | Disposition: A | Discharge status: Home / Self Care | Payer: Medicaid Other | Attending: Student | Admitting: Student

## 2019-04-14 ENCOUNTER — Other Ambulatory Visit: Payer: Self-pay

## 2019-04-14 DIAGNOSIS — I10 Essential (primary) hypertension: Secondary | ICD-10-CM | POA: Insufficient documentation

## 2019-04-14 DIAGNOSIS — N760 Acute vaginitis: Secondary | ICD-10-CM | POA: Diagnosis not present

## 2019-04-14 DIAGNOSIS — N390 Urinary tract infection, site not specified: Secondary | ICD-10-CM | POA: Diagnosis not present

## 2019-04-14 DIAGNOSIS — F17210 Nicotine dependence, cigarettes, uncomplicated: Secondary | ICD-10-CM | POA: Insufficient documentation

## 2019-04-14 DIAGNOSIS — B9689 Other specified bacterial agents as the cause of diseases classified elsewhere: Secondary | ICD-10-CM | POA: Diagnosis not present

## 2019-04-14 DIAGNOSIS — Z79899 Other long term (current) drug therapy: Secondary | ICD-10-CM | POA: Insufficient documentation

## 2019-04-14 DIAGNOSIS — N898 Other specified noninflammatory disorders of vagina: Secondary | ICD-10-CM | POA: Diagnosis present

## 2019-04-14 LAB — URINALYSIS, COMPLETE (UACMP) WITH MICROSCOPIC
Bilirubin Urine: NEGATIVE
Glucose, UA: NEGATIVE mg/dL
Hgb urine dipstick: NEGATIVE
Ketones, ur: NEGATIVE mg/dL
Nitrite: NEGATIVE
Protein, ur: NEGATIVE mg/dL
Specific Gravity, Urine: 1.015 (ref 1.005–1.030)
WBC, UA: >50 WBC/hpf — ABNORMAL HIGH (ref 0–5)
pH: 5 (ref 5.0–8.0)

## 2019-04-14 LAB — PREGNANCY, URINE: Preg Test, Ur: NEGATIVE

## 2019-04-14 LAB — WET PREP, GENITAL
Sperm: NONE SEEN
Trich, Wet Prep: NONE SEEN
Yeast Wet Prep HPF POC: NONE SEEN

## 2019-04-14 MED ORDER — HYDROCHLOROTHIAZIDE 12.5 MG PO TABS: 12.5000 mg | ORAL_TABLET | Freq: Every day | ORAL | 0 refills | Status: DC

## 2019-04-14 MED ORDER — NITROFURANTOIN MONOHYD MACRO 100 MG PO CAPS: 100.0000 mg | ORAL_CAPSULE | Freq: Two times a day (BID) | ORAL | 0 refills | Status: AC

## 2019-04-14 MED ORDER — METRONIDAZOLE 500 MG PO TABS: 500.0000 mg | ORAL_TABLET | Freq: Two times a day (BID) | ORAL | 0 refills | Status: AC

## 2019-04-14 NOTE — ED Provider Notes (Signed)
[facility] Emergency Department Provider Note  ____________________________________________   First MD Initiated Contact with Patient 04/14/19 1751     (approximate)  I have reviewed the triage vital signs and the nursing notes.  History  Chief Complaint Vaginal Discharge    HPI Yesenia Carrillo is a 23 y.o. female who presents to the emergency department for vaginal discharge.  Patient reports vaginal discharge and irritation for 1 week.  She describes a white, thin, malodorous discharge.  Associated with some mild vaginal irritation/itching.  She does also report some dysuria.  She denies any abdominal pain, fevers, nausea, vomiting.  She denies any concern for STD.  She denies any chance of pregnancy.  She also asked for refill of her blood pressure medication.   Past Medical Hx Past Medical History:  Diagnosis Date  . Hypertension     Problem List Patient Active Problem List   Diagnosis Date Noted  . Sepsis ([place]) 11/05/2018  . Acute respiratory failure with hypoxia ([place])   . CAP (community acquired pneumonia) 04/30/2018    Past Surgical Hx Past Surgical History:  Procedure Laterality Date  . TONSILLECTOMY    . WISDOM TOOTH EXTRACTION      Medications Prior to Admission medications   Medication Sig Start Date End Date Taking? Authorizing Provider  acetaminophen (TYLENOL) 325 MG tablet Take 2 tablets (650 mg total) by mouth every 6 (six) hours as needed for moderate pain (headache). 11/07/18   Awilda Bill, NP  albuterol (PROVENTIL) (2.5 MG/3ML) 0.083% nebulizer solution Take 3 mLs (2.5 mg total) by nebulization every 4 (four) hours. 11/07/18   Awilda Bill, NP  ceFEPIme 2 g in sodium chloride 0.9 % 100 mL Inject 2 g into the vein every 8 (eight) hours. 11/07/18   Awilda Bill, NP  docusate (COLACE) 50 MG/5ML liquid Place 10 mLs (100 mg total) into feeding tube 2 (two) times daily as needed for mild constipation. 11/07/18   Awilda Bill,  NP  fentaNYL (SUBLIMAZE) SOLN Inject 50 mcg into the vein every 15 (fifteen) minutes as needed (to maintain RASS & CPOT goal.). 11/07/18   Awilda Bill, NP  fentaNYL 10 mcg/ml SOLN infusion Inject 50-200 mcg/hr into the vein continuous. 11/07/18   Awilda Bill, NP  fluticasone (FLONASE) 50 MCG/ACT nasal spray Place 1 spray into both nostrils daily. 11/08/18   Awilda Bill, NP  guaiFENesin-codeine 100-10 MG/5ML syrup Take 5 mLs by mouth every 6 (six) hours as needed for cough. 03/31/19   Johnn Hai, PA-C  hydrochlorothiazide (HYDRODIURIL) 12.5 MG tablet Take 1 tablet (12.5 mg total) by mouth daily. 04/14/19 05/14/19  Lilia Pro., MD  ibuprofen (ADVIL) 400 MG tablet Take 1 tablet (400 mg total) by mouth every 6 (six) hours as needed for fever or mild pain. 11/07/18   Awilda Bill, NP  levalbuterol (XOPENEX) 0.63 MG/3ML nebulizer solution Take 3 mLs (0.63 mg total) by nebulization every 8 (eight) hours as needed for wheezing or shortness of breath. 11/07/18   Awilda Bill, NP  metroNIDAZOLE (FLAGYL) 500 MG tablet Take 1 tablet (500 mg total) by mouth 2 (two) times daily for 7 days. 04/14/19 04/21/19  Lilia Pro., MD  nitrofurantoin, macrocrystal-monohydrate, (MACROBID) 100 MG capsule Take 1 capsule (100 mg total) by mouth 2 (two) times daily for 5 days. 04/14/19 04/19/19  Lilia Pro., MD  potassium chloride (KLOR-CON) 20 MEQ packet Place 40 mEq into feeding tube every 4 (four)  hours. 11/07/18   Awilda Bill, NP    Allergies Ampicillin and Penicillins  Family Hx No family history on file.  Social Hx Social History   Tobacco Use  . Smoking status: Current Every Day Smoker    Types: Cigars  . Smokeless tobacco: Never Used  Substance Use Topics  . Alcohol use: Yes    Comment: monthly  . Drug use: No     Review of Systems  Constitutional: Negative for fever, chills. Eyes: Negative for visual changes. ENT: Negative for sore throat. Cardiovascular: Negative for  chest pain. Respiratory: Negative for shortness of breath. Gastrointestinal: Negative for nausea, vomiting.  Genitourinary: + vaginal discharge Musculoskeletal: Negative for leg swelling. Skin: Negative for rash. Neurological: Negative for for headaches.   Physical Exam  Vital Signs: ED Triage Vitals [04/14/19 1725]  Enc Vitals Group     BP (!) 150/109     Pulse Rate (!) 110     Resp 18     Temp 98.4 F (36.9 C)     Temp Source Oral     SpO2 98 %     Weight 255 lb (115.7 kg)     Height 5\' 2"  (1.575 m)     Head Circumference      Peak Flow      Pain Score 7     Pain Loc      Pain Edu?      Excl. in [place]?     Constitutional: Alert and oriented.  Head: Normocephalic. Atraumatic. Eyes: Conjunctivae clear. Sclera anicteric. Nose: No congestion. No rhinorrhea. Mouth/Throat: Wearing a mask. Neck: No stridor.   Cardiovascular: Normal rate. Extremities well perfused. Respiratory: Normal respiratory effort. Gastrointestinal: Soft. Non-tender. Non-distended.  Pelvic: RN chaperone present.  Normal external genitalia.  No rashes or lesions.  Moderate amount of thin white discharge in the vaginal canal.  No CMT.  Swabs obtained and sent to lab.   Musculoskeletal: No lower extremity edema. No deformities. Neurologic:  Normal speech and language. No gross focal neurologic deficits are appreciated.  Skin: Skin is warm, dry and intact. No rash noted. Psychiatric: Mood and affect are appropriate for situation.  EKG  N/A    Radiology  N/A   Procedures  Procedure(s) performed (including critical care):  Procedures   Initial Impression / Assessment and Plan / ED Course  23 y.o. female who presents to the ED for vaginal discharge, mild dysuria as above.  On exam, she does have a moderate amount of thin, white discharge in the vaginal canal.  No CMT.  UA suggestive of infection with WBCs, LE, many bacteria.  Given her dysuria, will treat.  Wet prep is positive for clue  cells.  Negative urine pregnancy.  GC/chlamydia sent out.  We will plan to treat with a course of Macrobid for UTI and metronidazole for BV.  Updated patient on results and plan of care.  She voices understanding and is comfortable plan and discharge.  Given return precautions.  Provided refill for her blood pressure medication, and advise follow-up with your PCP for continued medication management.   Final Clinical Impression(s) / ED Diagnosis  Final diagnoses:  BV (bacterial vaginosis)  Urinary tract infection in female       Note:  This document was prepared using Dragon voice recognition software and may include unintentional dictation errors.   Lilia Pro., MD 04/14/19 [place]

## 2019-04-14 NOTE — Discharge Instructions (Addendum)
Thank you for letting us take care of you in the emergency department today. Your work up has shown that you have bacterial vaginosis, a common vaginal infection. This is NOT an STD.  New medications we have prescribed:  - Metronidazole - antibiotic for bacterial vaginosis - Keflex - antibiotic for urine infection - We have also refilled your Rx for your blood pressure medication, be sure to follow up with your general doctor for further refills  - Your primary care doctor to review your ER visit and follow up on your symptoms.   Please return to the ER for any new or worsening symptoms.

## 2019-04-14 NOTE — ED Notes (Signed)
See triage note  Presents with some vaginal itching for couple of days  And some dysuria    Also states she is out of her b/p meds

## 2019-04-14 NOTE — ED Triage Notes (Addendum)
Vaginal irritation and discharge x 1 week. Also states has been off blood pressure meds and needs refill.

## 2019-04-14 NOTE — ED Provider Notes (Signed)
[facility] Emergency Department Provider Note  ____________________________________________   None    (approximate)   I have reviewed the triage vital signs and the nursing notes.   Patient has been triaged with a MSE exam performed by myself at a minimum. Based on symptoms and screening exam, patient may receive a more in-depth exam, labs, imaging as detailed below. Patients have been advised of this setting and exam type at the time of patient interview.    HISTORY  Chief Complaint No chief complaint on file.    HPI Yesenia Carrillo is a 23 y.o. female presents to the emergency department with a complaint of vaginal discharge x 1 week. No associated pain. No vaginal bleeding. No dysuria, hematuria, polyuria. No reported chance of pregnancy. No abdominal/flank pain. No URI symptoms. No fever.   Patient will receive a medical screening exam as detailed below.  Based off of this exam, more in depth exam, labs, imaging will be performed as needed for complaint.  Patient care will be eventually transferred to another provider in the emergency department for final exam, diagnosis and disposition.    Past Medical History:  Diagnosis Date  . Hypertension     Patient Active Problem List   Diagnosis Date Noted  . Sepsis ([place]) 11/05/2018  . Acute respiratory failure with hypoxia ([place])   . CAP (community acquired pneumonia) 04/30/2018    Past Surgical History:  Procedure Laterality Date  . TONSILLECTOMY    . WISDOM TOOTH EXTRACTION      Prior to Admission medications   Medication Sig Start Date End Date Taking? Authorizing Provider  acetaminophen (TYLENOL) 325 MG tablet Take 2 tablets (650 mg total) by mouth every 6 (six) hours as needed for moderate pain (headache). 11/07/18   Awilda Bill, NP  albuterol (PROVENTIL) (2.5 MG/3ML) 0.083% nebulizer solution Take 3 mLs (2.5 mg total) by nebulization every 4 (four) hours. 11/07/18   Awilda Bill, NP   ceFEPIme 2 g in sodium chloride 0.9 % 100 mL Inject 2 g into the vein every 8 (eight) hours. 11/07/18   Awilda Bill, NP  docusate (COLACE) 50 MG/5ML liquid Place 10 mLs (100 mg total) into feeding tube 2 (two) times daily as needed for mild constipation. 11/07/18   Awilda Bill, NP  fentaNYL (SUBLIMAZE) SOLN Inject 50 mcg into the vein every 15 (fifteen) minutes as needed (to maintain RASS & CPOT goal.). 11/07/18   Awilda Bill, NP  fentaNYL 10 mcg/ml SOLN infusion Inject 50-200 mcg/hr into the vein continuous. 11/07/18   Awilda Bill, NP  fluticasone (FLONASE) 50 MCG/ACT nasal spray Place 1 spray into both nostrils daily. 11/08/18   Awilda Bill, NP  guaiFENesin-codeine 100-10 MG/5ML syrup Take 5 mLs by mouth every 6 (six) hours as needed for cough. 03/31/19   Johnn Hai, PA-C  ibuprofen (ADVIL) 400 MG tablet Take 1 tablet (400 mg total) by mouth every 6 (six) hours as needed for fever or mild pain. 11/07/18   Awilda Bill, NP  levalbuterol (XOPENEX) 0.63 MG/3ML nebulizer solution Take 3 mLs (0.63 mg total) by nebulization every 8 (eight) hours as needed for wheezing or shortness of breath. 11/07/18   Awilda Bill, NP  potassium chloride (KLOR-CON) 20 MEQ packet Place 40 mEq into feeding tube every 4 (four) hours. 11/07/18   Awilda Bill, NP    Allergies Ampicillin and Penicillins  No family history on file.  Social History Social History  Tobacco Use  . Smoking status: Current Every Day Smoker    Types: Cigars  . Smokeless tobacco: Never Used  Substance Use Topics  . Alcohol use: Yes    Comment: monthly  . Drug use: No    Review of Systems Constitutional: no fever ENT: no nasal congestion/rhinorhea. no sore throat Cardiovascular: no chest pain. Respiratory: no cough. no shortness of breath/difficulty breathing Gastroenterology: no abdominal pain Genitourinary: Vaginal discharge Musculoskeletal: no for musculoskeletal pain Integumentary: Negative for  rash. Neurological: No focal weakness nor numbness.   ____________________________________________   PHYSICAL EXAM:  VITAL SIGNS: ED Triage Vitals  Enc Vitals Group     BP      Pulse      Resp      Temp      Temp src      SpO2      Weight      Height      Head Circumference      Peak Flow      Pain Score      Pain Loc      Pain Edu?      Excl. in [place]?     Constitutional: Alert and oriented. Generally well appearing and in no acute distress. Eyes: Conjunctivae are normal.  Nose: No significant congestion/rhinnorhea. Mouth: No gross oropharyngeal edema. no erythema/edema Neck: No stridor.  No meningeal signs.   Cardiovascular: Grossly normal heart sounds. Respiratory: Normal respiratory effort without significant tachypnea and no observed retractions. Lungs CTAB Gastrointestinal: No significant visible abdominal wall findings.  Bowel sounds x4 quadrants. no tenderness to palpation. Musculoskeletal: No gross deformities of extremities. Neurologic:  Normal speech and language. No gross focal neurologic deficits are appreciated.  Skin:  Skin is warm, dry and intact. No rash noted.    ____________________________________________   LABS (all labs ordered are listed, but only abnormal results are displayed)  Labs Reviewed - No data to display  ____________________________________________   RADIOLOGY   Official radiology report(s): No results found.  ____________________________________________    INITIAL IMPRESSION / MDM / ASSESSMENT AND PLAN / ED COURSE  As part of my medical decision making, I reviewed the following data within the electronic MEDICAL RECORD NUMBER Notes from prior ED visits      Clinical Impression: Vaginal discharge   Plan: Urinalysis and pregnancy test. Pelvic exam/wet prep upon rooming  Patient has been screened based based on their arrival complaint, evaluated for an emergent condition, and at a minimum has received a medical screening  exam.  At this time, patient will receive the further work-up listed above that was determined by medical screening exam.  Patient care will be transferred to another provider in the emergency department once patient is roomed for final diagnosis and disposition.    ____________________________________________  Note:  This document was prepared using Systems analyst and may include unintentional dictation errors.    Darletta Moll, PA-C 04/14/19 1725    Earleen Newport, MD 04/16/19 1049

## 2019-04-18 LAB — GC/CHLAMYDIA PROBE AMP
Chlamydia trachomatis, NAA: NEGATIVE
Neisseria Gonorrhoeae by PCR: NEGATIVE

## 2019-05-04 ENCOUNTER — Other Ambulatory Visit: Payer: Self-pay

## 2019-05-04 ENCOUNTER — Inpatient Hospital Stay
Admission: EM | Admit: 2019-05-04 | Discharge: 2019-05-08 | DRG: 831 | Disposition: A | Discharge status: Home / Self Care | Payer: Medicaid Other | Attending: Internal Medicine | Admitting: Internal Medicine

## 2019-05-04 ENCOUNTER — Emergency Department: Payer: Medicaid Other

## 2019-05-04 ENCOUNTER — Encounter: Payer: Self-pay | Admitting: Emergency Medicine

## 2019-05-04 DIAGNOSIS — J1282 Pneumonia due to coronavirus disease 2019: Secondary | ICD-10-CM | POA: Diagnosis present

## 2019-05-04 DIAGNOSIS — O98511 Other viral diseases complicating pregnancy, first trimester: Secondary | ICD-10-CM | POA: Diagnosis present

## 2019-05-04 DIAGNOSIS — E669 Obesity, unspecified: Secondary | ICD-10-CM | POA: Diagnosis present

## 2019-05-04 DIAGNOSIS — Z86711 Personal history of pulmonary embolism: Secondary | ICD-10-CM

## 2019-05-04 DIAGNOSIS — O99211 Obesity complicating pregnancy, first trimester: Secondary | ICD-10-CM | POA: Diagnosis present

## 2019-05-04 DIAGNOSIS — O161 Unspecified maternal hypertension, first trimester: Secondary | ICD-10-CM | POA: Diagnosis present

## 2019-05-04 DIAGNOSIS — Z3A01 Less than 8 weeks gestation of pregnancy: Secondary | ICD-10-CM

## 2019-05-04 DIAGNOSIS — Z9089 Acquired absence of other organs: Secondary | ICD-10-CM | POA: Diagnosis not present

## 2019-05-04 DIAGNOSIS — Z3A00 Weeks of gestation of pregnancy not specified: Secondary | ICD-10-CM

## 2019-05-04 DIAGNOSIS — O99511 Diseases of the respiratory system complicating pregnancy, first trimester: Secondary | ICD-10-CM | POA: Diagnosis present

## 2019-05-04 DIAGNOSIS — O2311 Infections of bladder in pregnancy, first trimester: Secondary | ICD-10-CM | POA: Diagnosis not present

## 2019-05-04 DIAGNOSIS — J069 Acute upper respiratory infection, unspecified: Secondary | ICD-10-CM

## 2019-05-04 DIAGNOSIS — Z880 Allergy status to penicillin: Secondary | ICD-10-CM | POA: Diagnosis not present

## 2019-05-04 DIAGNOSIS — F17290 Nicotine dependence, other tobacco product, uncomplicated: Secondary | ICD-10-CM | POA: Diagnosis present

## 2019-05-04 DIAGNOSIS — U071 COVID-19: Secondary | ICD-10-CM | POA: Diagnosis present

## 2019-05-04 DIAGNOSIS — J9601 Acute respiratory failure with hypoxia: Secondary | ICD-10-CM

## 2019-05-04 DIAGNOSIS — N3001 Acute cystitis with hematuria: Secondary | ICD-10-CM

## 2019-05-04 DIAGNOSIS — Z3490 Encounter for supervision of normal pregnancy, unspecified, unspecified trimester: Secondary | ICD-10-CM

## 2019-05-04 DIAGNOSIS — O2310 Infections of bladder in pregnancy, unspecified trimester: Secondary | ICD-10-CM

## 2019-05-04 DIAGNOSIS — J1289 Other viral pneumonia: Secondary | ICD-10-CM | POA: Diagnosis present

## 2019-05-04 DIAGNOSIS — O99331 Smoking (tobacco) complicating pregnancy, first trimester: Secondary | ICD-10-CM | POA: Diagnosis present

## 2019-05-04 LAB — CBC WITH DIFFERENTIAL/PLATELET
Abs Immature Granulocytes: 0.03 10*3/uL (ref 0.00–0.07)
Basophils Absolute: 0 10*3/uL (ref 0.0–0.1)
Basophils Relative: 0 %
Eosinophils Absolute: 0 10*3/uL (ref 0.0–0.5)
Eosinophils Relative: 0 %
HCT: 37.4 % (ref 36.0–46.0)
Hemoglobin: 12.8 g/dL (ref 12.0–15.0)
Immature Granulocytes: 0 %
Lymphocytes Relative: 10 %
Lymphs Abs: 1.3 10*3/uL (ref 0.7–4.0)
MCH: 25.5 pg — ABNORMAL LOW (ref 26.0–34.0)
MCHC: 34.2 g/dL (ref 30.0–36.0)
MCV: 74.5 fL — ABNORMAL LOW (ref 80.0–100.0)
Monocytes Absolute: 0.9 10*3/uL (ref 0.1–1.0)
Monocytes Relative: 7 %
Neutro Abs: 10 10*3/uL — ABNORMAL HIGH (ref 1.7–7.7)
Neutrophils Relative %: 83 %
Platelets: 355 10*3/uL (ref 150–400)
RBC: 5.02 MIL/uL (ref 3.87–5.11)
RDW: 16.7 % — ABNORMAL HIGH (ref 11.5–15.5)
WBC: 12.2 10*3/uL — ABNORMAL HIGH (ref 4.0–10.5)
nRBC: 0 % (ref 0.0–0.2)

## 2019-05-04 LAB — URINALYSIS, COMPLETE (UACMP) WITH MICROSCOPIC
Bilirubin Urine: NEGATIVE
Glucose, UA: NEGATIVE mg/dL
Ketones, ur: NEGATIVE mg/dL
Nitrite: POSITIVE — AB
Protein, ur: NEGATIVE mg/dL
Specific Gravity, Urine: 1.015 (ref 1.005–1.030)
WBC, UA: >50 WBC/hpf — ABNORMAL HIGH (ref 0–5)
pH: 5 (ref 5.0–8.0)

## 2019-05-04 LAB — LACTIC ACID, PLASMA
Lactic Acid, Venous: 1 mmol/L (ref 0.5–1.9)
Lactic Acid, Venous: 0.8 mmol/L (ref 0.5–1.9)

## 2019-05-04 LAB — PROTIME-INR
INR: 1 (ref 0.8–1.2)
Prothrombin Time: 13.5 seconds (ref 11.4–15.2)

## 2019-05-04 LAB — ABO/RH: ABO/RH(D): O POS

## 2019-05-04 LAB — COMPREHENSIVE METABOLIC PANEL
ALT: 77 U/L — ABNORMAL HIGH (ref 0–44)
AST: 97 U/L — ABNORMAL HIGH (ref 15–41)
Albumin: 3.8 g/dL (ref 3.5–5.0)
Alkaline Phosphatase: 46 U/L (ref 38–126)
Anion gap: 13 (ref 5–15)
BUN: 6 mg/dL (ref 6–20)
CO2: 22 mmol/L (ref 22–32)
Calcium: 9 mg/dL (ref 8.9–10.3)
Chloride: 102 mmol/L (ref 98–111)
Creatinine, Ser: 0.62 mg/dL (ref 0.44–1.00)
GFR calc Af Amer: >60 mL/min (ref >60)
GFR calc non Af Amer: >60 mL/min (ref >60)
Glucose, Bld: 101 mg/dL — ABNORMAL HIGH (ref 70–99)
Potassium: 3.7 mmol/L (ref 3.5–5.1)
Sodium: 137 mmol/L (ref 135–145)
Total Bilirubin: 0.7 mg/dL (ref 0.3–1.2)
Total Protein: 8.1 g/dL (ref 6.5–8.1)

## 2019-05-04 LAB — PROCALCITONIN: Procalcitonin: <0.1 ng/mL

## 2019-05-04 LAB — FIBRIN DERIVATIVES D-DIMER (ARMC ONLY): Fibrin derivatives D-dimer (ARMC): 886.41 ng/mL (FEU) — ABNORMAL HIGH (ref 0.00–499.00)

## 2019-05-04 LAB — GLUCOSE, CAPILLARY
Glucose-Capillary: 92 mg/dL (ref 70–99)
Glucose-Capillary: 136 mg/dL — ABNORMAL HIGH (ref 70–99)

## 2019-05-04 LAB — TROPONIN I (HIGH SENSITIVITY)
Troponin I (High Sensitivity): 26 ng/L — ABNORMAL HIGH (ref <18)
Troponin I (High Sensitivity): 25 ng/L — ABNORMAL HIGH (ref <18)

## 2019-05-04 LAB — HEMOGLOBIN A1C
Hgb A1c MFr Bld: 5.1 % (ref 4.8–5.6)
Mean Plasma Glucose: 99.67 mg/dL

## 2019-05-04 LAB — POC SARS CORONAVIRUS 2 AG: SARS Coronavirus 2 Ag: POSITIVE — AB

## 2019-05-04 LAB — APTT: aPTT: 41 seconds — ABNORMAL HIGH (ref 24–36)

## 2019-05-04 LAB — POCT PREGNANCY, URINE: Preg Test, Ur: POSITIVE — AB

## 2019-05-04 LAB — HCG, QUANTITATIVE, PREGNANCY: hCG, Beta Chain, Quant, S: 51 m[IU]/mL — ABNORMAL HIGH (ref <5)

## 2019-05-04 MED ORDER — VANCOMYCIN HCL IN DEXTROSE 1-5 GM/200ML-% IV SOLN
1000.0000 mg | Freq: Once | INTRAVENOUS | Status: AC
  Administered 2019-05-04: 1000 mg via INTRAVENOUS
  Filled 2019-05-04: qty 200

## 2019-05-04 MED ORDER — HYDROCOD POLST-CPM POLST ER 10-8 MG/5ML PO SUER: 5.0000 mL | Freq: Two times a day (BID) | ORAL | Status: DC | PRN

## 2019-05-04 MED ORDER — ZINC SULFATE 220 (50 ZN) MG PO CAPS
220.0000 mg | ORAL_CAPSULE | Freq: Every day | ORAL | Status: DC
  Administered 2019-05-04 – 2019-05-08 (×5): 220 mg via ORAL
  Filled 2019-05-04 (×5): qty 1

## 2019-05-04 MED ORDER — GUAIFENESIN-DM 100-10 MG/5ML PO SYRP
10.0000 mL | ORAL_SOLUTION | ORAL | Status: DC | PRN
  Administered 2019-05-07: 10 mL via ORAL
  Filled 2019-05-04 (×2): qty 10

## 2019-05-04 MED ORDER — SODIUM CHLORIDE 0.9 % IV SOLN
2.0000 g | Freq: Once | INTRAVENOUS | Status: AC
  Administered 2019-05-04: 2 g via INTRAVENOUS
  Filled 2019-05-04: qty 2

## 2019-05-04 MED ORDER — INSULIN ASPART 100 UNIT/ML ~~LOC~~ SOLN: 0.0000 [IU] | Freq: Three times a day (TID) | SUBCUTANEOUS | Status: DC

## 2019-05-04 MED ORDER — NITROFURANTOIN MONOHYD MACRO 100 MG PO CAPS
100.0000 mg | ORAL_CAPSULE | Freq: Two times a day (BID) | ORAL | Status: AC
  Administered 2019-05-04 – 2019-05-06 (×5): 100 mg via ORAL
  Filled 2019-05-04 (×5): qty 1

## 2019-05-04 MED ORDER — ACETAMINOPHEN 325 MG PO TABS
650.0000 mg | ORAL_TABLET | Freq: Four times a day (QID) | ORAL | Status: DC | PRN
  Administered 2019-05-04: 650 mg via ORAL
  Filled 2019-05-04: qty 2

## 2019-05-04 MED ORDER — SENNOSIDES-DOCUSATE SODIUM 8.6-50 MG PO TABS: 1.0000 | ORAL_TABLET | Freq: Every evening | ORAL | Status: DC | PRN

## 2019-05-04 MED ORDER — SODIUM CHLORIDE 0.9 % IV BOLUS
1000.0000 mL | Freq: Once | INTRAVENOUS | Status: AC
  Administered 2019-05-04: 1000 mL via INTRAVENOUS

## 2019-05-04 MED ORDER — IPRATROPIUM-ALBUTEROL 20-100 MCG/ACT IN AERS
1.0000 | INHALATION_SPRAY | Freq: Four times a day (QID) | RESPIRATORY_TRACT | Status: DC
  Administered 2019-05-04 – 2019-05-08 (×15): 1 via RESPIRATORY_TRACT
  Filled 2019-05-04 (×2): qty 4

## 2019-05-04 MED ORDER — SODIUM CHLORIDE 0.9 % IV SOLN
200.0000 mg | Freq: Once | INTRAVENOUS | Status: AC
  Administered 2019-05-04: 200 mg via INTRAVENOUS
  Filled 2019-05-04 (×2): qty 40

## 2019-05-04 MED ORDER — SODIUM CHLORIDE 0.9 % IV SOLN
100.0000 mg | INTRAVENOUS | Status: AC
  Administered 2019-05-05 – 2019-05-08 (×4): 100 mg via INTRAVENOUS
  Filled 2019-05-04 (×4): qty 20

## 2019-05-04 MED ORDER — INSULIN ASPART 100 UNIT/ML ~~LOC~~ SOLN
6.0000 [IU] | Freq: Three times a day (TID) | SUBCUTANEOUS | Status: DC
  Filled 2019-05-04: qty 1

## 2019-05-04 MED ORDER — ACETAMINOPHEN 325 MG PO TABS
650.0000 mg | ORAL_TABLET | Freq: Once | ORAL | Status: AC | PRN
  Administered 2019-05-04: 650 mg via ORAL
  Filled 2019-05-04: qty 2

## 2019-05-04 MED ORDER — METHYLPREDNISOLONE SODIUM SUCC 40 MG IJ SOLR
40.0000 mg | Freq: Three times a day (TID) | INTRAMUSCULAR | Status: DC
  Administered 2019-05-04 – 2019-05-08 (×12): 40 mg via INTRAVENOUS
  Filled 2019-05-04 (×11): qty 1

## 2019-05-04 MED ORDER — ENOXAPARIN SODIUM 40 MG/0.4ML ~~LOC~~ SOLN
40.0000 mg | SUBCUTANEOUS | Status: DC
  Administered 2019-05-04: 40 mg via SUBCUTANEOUS
  Filled 2019-05-04: qty 0.4

## 2019-05-04 MED ORDER — HYDROCODONE-ACETAMINOPHEN 5-325 MG PO TABS: 1.0000 | ORAL_TABLET | ORAL | Status: DC | PRN

## 2019-05-04 MED ORDER — INSULIN ASPART 100 UNIT/ML ~~LOC~~ SOLN: 0.0000 [IU] | Freq: Every day | SUBCUTANEOUS | Status: DC

## 2019-05-04 MED ORDER — VITAMIN C 500 MG PO TABS
500.0000 mg | ORAL_TABLET | Freq: Every day | ORAL | Status: DC
  Administered 2019-05-04 – 2019-05-08 (×5): 500 mg via ORAL
  Filled 2019-05-04 (×5): qty 1

## 2019-05-04 MED ORDER — SODIUM CHLORIDE 0.9% FLUSH
3.0000 mL | Freq: Once | INTRAVENOUS | Status: AC
  Administered 2019-05-06: 3 mL via INTRAVENOUS

## 2019-05-04 NOTE — ED Notes (Signed)
Call bell answered again, pt with urine in bed and loose stool, pt bedding and clothing changed again and revitalized

## 2019-05-04 NOTE — H&P (Signed)
History and Physical    Yesenia Carrillo [id number] DOB: 1996/05/10 DOA: 05/04/2019  PCP: [facility] [place] Patient coming from: Home  Chief Complaint: Cough  HPI: Yesenia Carrillo is a 23 y.o. female with medical history significant of essential hypertension comes to the hospital with complaints of cough, fevers and chills.  Patient was admitted here back in June with similar complaints and was in ARDS requiring intubation.  Her COVID-19 testing was previously negative.  Her symptoms have progressed over the course the last couple of days where she was not able to speak in full sentences therefore came to the hospital. In the ER she was tested positive for COVID-19.  Her inflammatory markers are pending she was hypoxic requiring 2 L of nasal cannula.  Chest x-ray showed mild basilar infiltrate.  Her beta-hCG pregnancy came back positive.  She also thinks she might be pregnant.  It was decided to admit her. Unable to obtain CTA due to pregnancy.  I spoke with Dr. Kenton Kingfisher from [facility], recommend routine admission and okay with steroid, remdesivir and any other pregnancy safe antibiotics as needed.  Otherwise no other care for now.  Follow-up outpatient.  Too early for ultrasound.   Review of Systems: As per HPI otherwise 10 point review of systems negative.  Review of Systems Otherwise negative except as per HPI, including: General: Denies night sweats or unintended weight loss. Resp: Denies hemoptysis Cardiac: Denies chest pain, palpitations, orthopnea, paroxysmal nocturnal dyspnea. GI: Denies abdominal pain, nausea, vomiting, diarrhea or constipation GU: Denies dysuria, frequency, hesitancy or incontinence MS: Denies muscle aches, joint pain or swelling Neuro: Denies headache, neurologic deficits (focal weakness, numbness, tingling), abnormal gait Psych: Denies anxiety, depression, SI/HI/AVH Skin: Denies new rashes or lesions ID: Denies sick contacts, exotic exposures, travel   Past Medical History:  Diagnosis Date  . Hypertension     Past Surgical History:  Procedure Laterality Date  . TONSILLECTOMY    . WISDOM TOOTH EXTRACTION      SOCIAL HISTORY:  reports that she has been smoking cigars. She has never used smokeless tobacco. She reports current alcohol use. She reports that she does not use drugs.  Allergies  Allergen Reactions  . Ampicillin Rash and Other (See Comments)    Patient and family cannot remember the specifics of the reaction.  Mar[place] Kitchen Penicillins Hives    FAMILY HISTORY: No family history on file.   Prior to Admission medications   Medication Sig Start Date End Date Taking? Authorizing Provider  hydrochlorothiazide (HYDRODIURIL) 12.5 MG tablet Take 1 tablet (12.5 mg total) by mouth daily. 04/14/19 05/14/19 Yes Lilia Pro., MD  Phenylephrine-Pheniramine-DM [facility] COLD & COUGH PO) Take by mouth daily as needed.   Yes [provider]    Physical Exam: Vitals:   05/04/19 1310 05/04/19 1430 05/04/19 1530 05/04/19 1600  BP:  124/72 120/70 108/73  Pulse:  (!) 116 (!) 121 (!) 117  Resp:  (!) 22 (!) 38 (!) 23  Temp: (!) 102.5 F (39.2 C)     TempSrc: Oral     SpO2:  99% 100% 100%  Weight:      Height:          Constitutional: NAD, calm, comfortable, 2 L nasal cannula Eyes: PERRL, lids and conjunctivae normal ENMT: Mucous membranes are moist. Posterior pharynx clear of any exudate or lesions.Normal dentition.  Neck: normal, supple, no masses, no thyromegaly Respiratory: Minimal basilar rhonchi Cardiovascular: Regular rate and rhythm, no murmurs / rubs /  gallops. No extremity edema. 2+ pedal pulses. No carotid bruits.  Abdomen: no tenderness, no masses palpated. No hepatosplenomegaly. Bowel sounds positive.  Musculoskeletal: no clubbing / cyanosis. No joint deformity upper and lower extremities. Good ROM, no contractures. Normal muscle tone.  Skin: no rashes, lesions, ulcers. No induration Neurologic: CN 2-12 grossly  intact. Sensation intact, DTR normal. Strength 5/5 in all 4.  Psychiatric: Normal judgment and insight. Alert and oriented x 3. Normal mood.     Labs on Admission: I have personally reviewed following labs and imaging studies  CBC: Recent Labs  Lab 05/04/19 1120  WBC 12.2*  NEUTROABS 10.0*  HGB 12.8  HCT 37.4  MCV 74.5*  PLT [id number]   Basic Metabolic Panel: Recent Labs  Lab 05/04/19 1120  NA 137  K 3.7  CL 102  CO2 22  GLUCOSE 101*  BUN 6  CREATININE 0.62  CALCIUM 9.0   GFR: Estimated Creatinine Clearance: 132.9 mL/min (by C-G formula based on SCr of 0.62 mg/dL). Liver Function Tests: Recent Labs  Lab 05/04/19 1120  AST 97*  ALT 77*  ALKPHOS 46  BILITOT 0.7  PROT 8.1  ALBUMIN 3.8   No results for input(s): LIPASE, AMYLASE in the last 168 hours. No results for input(s): AMMONIA in the last 168 hours. Coagulation Profile: Recent Labs  Lab 05/04/19 1120  INR 1.0   Cardiac Enzymes: No results for input(s): CKTOTAL, CKMB, CKMBINDEX, TROPONINI in the last 168 hours. BNP (last 3 results) No results for input(s): PROBNP in the last 8760 hours. HbA1C: No results for input(s): HGBA1C in the last 72 hours. CBG: No results for input(s): GLUCAP in the last 168 hours. Lipid Profile: No results for input(s): CHOL, HDL, LDLCALC, TRIG, CHOLHDL, LDLDIRECT in the last 72 hours. Thyroid Function Tests: No results for input(s): TSH, T4TOTAL, FREET4, T3FREE, THYROIDAB in the last 72 hours. Anemia Panel: No results for input(s): VITAMINB12, FOLATE, FERRITIN, TIBC, IRON, RETICCTPCT in the last 72 hours. Urine analysis:    Component Value Date/Time   COLORURINE YELLOW (A) 05/04/2019 1511   APPEARANCEUR HAZY (A) 05/04/2019 1511   APPEARANCEUR Clear 06/06/2013 2001   LABSPEC 1.015 05/04/2019 1511   LABSPEC 1.013 06/06/2013 2001   PHURINE 5.0 05/04/2019 1511   GLUCOSEU NEGATIVE 05/04/2019 1511   GLUCOSEU Negative 06/06/2013 2001   HGBUR SMALL (A) 05/04/2019 1511    BILIRUBINUR NEGATIVE 05/04/2019 1511   BILIRUBINUR Negative 06/06/2013 2001   KETONESUR NEGATIVE 05/04/2019 1511   PROTEINUR NEGATIVE 05/04/2019 1511   NITRITE POSITIVE (A) 05/04/2019 1511   LEUKOCYTESUR MODERATE (A) 05/04/2019 1511   LEUKOCYTESUR Negative 06/06/2013 2001   Sepsis Labs: !!!!!!!!!!!!!!!!!!!!!!!!!!!!!!!!!!!!!!!!!!!! @LABRCNTIP (procalcitonin:4,lacticidven:4) )No results found for this or any previous visit (from the past 240 hour(s)).   Radiological Exams on Admission: Dg Chest 2 View  Result Date: 05/04/2019 CLINICAL DATA:  Cough and fever EXAM: CHEST - 2 VIEW COMPARISON:  March 31, 2019 FINDINGS: There is slight atelectasis in the right base. The lungs elsewhere clear. Heart is upper normal in size with pulmonary vascularity normal. No adenopathy. No bone lesions. IMPRESSION: Slight right base atelectasis. Earliest changes of pneumonia in this area cannot be excluded. Lungs elsewhere clear. Heart upper normal in size. No adenopathy. Electronically Signed   By: Lowella Grip III M.D.   On: 05/04/2019 12:11     All images have been reviewed by me personally.    Assessment/Plan Principal Problem:   Acute respiratory disease due to COVID-19 virus Active Problems:   Pregnancy   Acute  cystitis during pregnancy   Acute respiratory distress secondary to COVID-19 -Oxygen levels-2 L nasal cannula, 92% -Remdesivir-day 1 -IV Solu-Medrol -inflammatory labs-pending.  Routine: Labs have been reviewed including ferritin, LDH, CRP, d-dimer, fibrinogen.  Will need to trend this lab daily. -Vitamin C & Zinc. Prone >16hrs/day.  -procalcitonin-negative -Chest x-ray-slight right basilar infiltrate/atelectasis -Supportive care-antitussive, inhalers, I-S/flutter -CODE STATUS confirmed -If D-dimer significantly elevated, consider VQ scan  Acute cystitis during pregnancy -Macrobid twice daily X 5 days  Early pregnancy -Quantitative beta hCG 51.  Very early in pregnancy for  ultrasound to show anything.  Discussed with Dr. Kenton Kingfisher from [facility], nothing different to do at this point.  Okay with steroids and remdesivir, also any other pregnancy safe drugs.  Follow-up outpatient.    DVT prophylaxis: Subcutaneous Lovenox Code Status: Full code Family Communication: None Disposition Plan: To be determined Consults called: Curbside and Dr. Kenton Kingfisher from [facility] Admission status: Inpatient admission to [facility] for COVID-19 pneumonia.  Requires IV remdesivir and Solu-Medrol.   Time Spent: 65 minutes.  >50% of the time was devoted to discussing the patients care, assessment, plan and disposition with other care givers along with counseling the patient about the risks and benefits of treatment.    Ankit Arsenio Loader MD [facility]  If 7PM-7AM, please contact night-coverage   05/04/2019, 5:00 PM

## 2019-05-04 NOTE — ED Notes (Signed)
Pt placed on 2L via [place].  

## 2019-05-04 NOTE — ED Notes (Signed)
Per MD order, patient taken off of 2L [place] and trialed on room air. Oxygen saturation dropped to 88-91% on RA. Patient also reports increased SOB on room air. Patient then placed on 1L [place]. O2 increased to 95% and patient report improvement in breathing. MD made aware.

## 2019-05-04 NOTE — ED Notes (Signed)
Pt had urine and stool (loose) in bed. Pt. Was cleaned up and bedding changed. She was also given a sandwich tray, apple sauce, and cranberry juice.

## 2019-05-04 NOTE — Progress Notes (Signed)
PHARMACY -  BRIEF ANTIBIOTIC NOTE   Pharmacy has received consult(s) for Vancomycin and Azactam from an ED provider.  The patient's profile has been reviewed for ht/wt/allergies/indication/available labs.    One time order(s) placed for Vancomycin and Azactam by ED provider.  Further antibiotics/pharmacy consults should be ordered by admitting physician if indicated.                       Thank you, Vira Blanco 05/04/2019  1:13 PM

## 2019-05-04 NOTE — ED Notes (Signed)
This nurse drew labs for this pt, unable to get the blood type pink top tube. This nurse called lab and they states that they would send someone to get pink top tube.

## 2019-05-04 NOTE — ED Triage Notes (Signed)
Pt presents to ED via POV with c/o cough and chills. Pt ambulatory to the desk without difficulty. Pt able to speak in complete sentences at this time. Pt denies productive cough. Pt states one minute I'm hot, the next I have chills. Pt with dry cough noted upon arrival at this time.

## 2019-05-04 NOTE — Consult Note (Signed)
Remdesivir - Pharmacy Brief Note   O:  ALT: 77 CXR: Slight right base atelectasis. Earliest changes of pneumonia in this area cannot be excluded. Lungs elsewhere clear. Heart upper normal in size. No adenopathy. SpO2: Initially 94% on 2L/min, now 95% on 1L/min nasal cannula   A/P:  Remdesivir 200 mg IVPB once followed by 100 mg IVPB daily x 4 days.   Pearla Dubonnet, PharmD Clinical Pharmacist 05/04/2019 6:46 PM

## 2019-05-04 NOTE — Progress Notes (Signed)
CODE SEPSIS - PHARMACY COMMUNICATION  **Broad Spectrum Antibiotics should be administered within 1 hour of Sepsis diagnosis**  Time Code Sepsis Called/Page Received: 1302  Antibiotics Ordered: Azactam and Vancomycin   Time of 1st antibiotic administration: Azactam given at 1339  Additional action taken by pharmacy:    If necessary, Name of Provider/Nurse Contacted:      Vira Blanco ,PharmD Clinical Pharmacist  05/04/2019  1:44 PM

## 2019-05-04 NOTE — ED Provider Notes (Signed)
[facility] Emergency Department Provider Note  ____________________________________________   First MD Initiated Contact with Patient 05/04/19 1244     (approximate)  I have reviewed the triage vital signs and the nursing notes.   HISTORY  Chief Complaint Chills and Cough    HPI Yesenia Carrillo is a 23 y.o. female with community-acquired pneumonia, sepsis, respiratory failure with hypoxia, PE status post anticoagulation who presents with cough and chills.  Patient states that back and June 2020 she had developed ARDS with multifocal pneumonia.  Also complicated by PE.  She is negative for coronavirus x2.  She was on ECMO and was intubated during this stay.  She states she is had a cough for the past month however over the past 2 days she developed increasing chills and dizziness that are moderate, constant, nothing makes better, nothing some worse.  She denies any shortness of breath was found to be hypoxic to 88%.  Denies any chest pain.  Denies any leg swelling.       Past Medical History:  Diagnosis Date  . Hypertension     Patient Active Problem List   Diagnosis Date Noted  . Sepsis ([place]) 11/05/2018  . Acute respiratory failure with hypoxia ([place])   . CAP (community acquired pneumonia) 04/30/2018    Past Surgical History:  Procedure Laterality Date  . TONSILLECTOMY    . WISDOM TOOTH EXTRACTION      Prior to Admission medications   Medication Sig Start Date End Date Taking? Authorizing Provider  acetaminophen (TYLENOL) 325 MG tablet Take 2 tablets (650 mg total) by mouth every 6 (six) hours as needed for moderate pain (headache). 11/07/18   Awilda Bill, NP  albuterol (PROVENTIL) (2.5 MG/3ML) 0.083% nebulizer solution Take 3 mLs (2.5 mg total) by nebulization every 4 (four) hours. 11/07/18   Awilda Bill, NP  ceFEPIme 2 g in sodium chloride 0.9 % 100 mL Inject 2 g into the vein every 8 (eight) hours. 11/07/18   Awilda Bill, NP   docusate (COLACE) 50 MG/5ML liquid Place 10 mLs (100 mg total) into feeding tube 2 (two) times daily as needed for mild constipation. 11/07/18   Awilda Bill, NP  fentaNYL (SUBLIMAZE) SOLN Inject 50 mcg into the vein every 15 (fifteen) minutes as needed (to maintain RASS & CPOT goal.). 11/07/18   Awilda Bill, NP  fentaNYL 10 mcg/ml SOLN infusion Inject 50-200 mcg/hr into the vein continuous. 11/07/18   Awilda Bill, NP  fluticasone (FLONASE) 50 MCG/ACT nasal spray Place 1 spray into both nostrils daily. 11/08/18   Awilda Bill, NP  guaiFENesin-codeine 100-10 MG/5ML syrup Take 5 mLs by mouth every 6 (six) hours as needed for cough. 03/31/19   Johnn Hai, PA-C  hydrochlorothiazide (HYDRODIURIL) 12.5 MG tablet Take 1 tablet (12.5 mg total) by mouth daily. 04/14/19 05/14/19  Lilia Pro., MD  ibuprofen (ADVIL) 400 MG tablet Take 1 tablet (400 mg total) by mouth every 6 (six) hours as needed for fever or mild pain. 11/07/18   Awilda Bill, NP  levalbuterol (XOPENEX) 0.63 MG/3ML nebulizer solution Take 3 mLs (0.63 mg total) by nebulization every 8 (eight) hours as needed for wheezing or shortness of breath. 11/07/18   Awilda Bill, NP  potassium chloride (KLOR-CON) 20 MEQ packet Place 40 mEq into feeding tube every 4 (four) hours. 11/07/18   Awilda Bill, NP    Allergies Ampicillin and Penicillins  No family history on file.  Social History Social History   Tobacco Use  . Smoking status: Current Every Day Smoker    Types: Cigars  . Smokeless tobacco: Never Used  Substance Use Topics  . Alcohol use: Yes    Comment: monthly  . Drug use: No      Review of Systems Constitutional: No fever/chills Eyes: No visual changes. ENT: No sore throat. Cardiovascular: Denies chest pain. Respiratory: Denies shortness of breath. Gastrointestinal: No abdominal pain.  No nausea, no vomiting.  No diarrhea.  No constipation. Genitourinary: Negative for dysuria. Musculoskeletal:  Negative for back pain. Skin: Negative for rash. Neurological: Negative for headaches, focal weakness or numbness. All other ROS negative ____________________________________________   PHYSICAL EXAM:  VITAL SIGNS: ED Triage Vitals  Enc Vitals Group     BP 05/04/19 1108 133/74     Pulse Rate 05/04/19 1108 (!) 143     Resp 05/04/19 1108 (!) 24     Temp 05/04/19 1108 (!) 103.1 F (39.5 C)     Temp Source 05/04/19 1108 Oral     SpO2 05/04/19 1108 (!) 89 %     Weight 05/04/19 1104 255 lb (115.7 kg)     Height 05/04/19 1104 _0  (1.575 m)     Head Circumference --      Peak Flow --      Pain Score 05/04/19 1104 0     Pain Loc --      Pain Edu? --      Excl. in [place]? --     Constitutional: Alert and oriented. Well appearing and in no acute distress. Eyes: Conjunctivae are normal. EOMI. Head: Atraumatic. Nose: No congestion/rhinnorhea. Mouth/Throat: Mucous membranes are moist.   Neck: No stridor. Trachea Midline. FROM Cardiovascular:tachycardia  regular rhythm. Grossly normal heart sounds.  Good peripheral circulation. Respiratory: Mild increased work of breathing.  On 2 L Gastrointestinal: Soft and nontender. No distention. No abdominal bruits.  Musculoskeletal: No lower extremity tenderness nor edema.  No joint effusions. Neurologic:  Normal speech and language. No gross focal neurologic deficits are appreciated.  Skin:  Skin is warm, dry and intact. No rash noted. Psychiatric: Mood and affect are normal. Speech and behavior are normal. GU: Deferred   ____________________________________________   LABS (all labs ordered are listed, but only abnormal results are displayed)  Labs Reviewed  COMPREHENSIVE METABOLIC PANEL - Abnormal; Notable for the following components:      Result Value   Glucose, Bld 101 (*)    AST 97 (*)    ALT 77 (*)    All other components within normal limits  CBC WITH DIFFERENTIAL/PLATELET - Abnormal; Notable for the following components:   WBC  12.2 (*)    MCV 74.5 (*)    MCH 25.5 (*)    RDW 16.7 (*)    Neutro Abs 10.0 (*)    All other components within normal limits  URINALYSIS, COMPLETE (UACMP) WITH MICROSCOPIC - Abnormal; Notable for the following components:   Color, Urine YELLOW (*)    APPearance HAZY (*)    Hgb urine dipstick SMALL (*)    Nitrite POSITIVE (*)    Leukocytes,Ua MODERATE (*)    WBC, UA >50 (*)    Bacteria, UA RARE (*)    All other components within normal limits  APTT - Abnormal; Notable for the following components:   aPTT 41 (*)    All other components within normal limits  HCG, QUANTITATIVE, PREGNANCY - Abnormal; Notable for the following components:   hCG, Beta  Chain, Quant, S 51 (*)    All other components within normal limits  POC SARS CORONAVIRUS 2 AG - Abnormal; Notable for the following components:   SARS Coronavirus 2 Ag POSITIVE (*)    All other components within normal limits  POCT PREGNANCY, URINE - Abnormal; Notable for the following components:   Preg Test, Ur POSITIVE (*)    All other components within normal limits  CULTURE, BLOOD (ROUTINE X 2)  CULTURE, BLOOD (ROUTINE X 2)  URINE CULTURE  LACTIC ACID, PLASMA  LACTIC ACID, PLASMA  PROCALCITONIN  PROTIME-INR  FIBRIN DERIVATIVES D-DIMER ([facility] ONLY)  POC SARS CORONAVIRUS 2 AG -  ED  POC URINE PREG, ED  TROPONIN I (HIGH SENSITIVITY)   ____________________________________________   ED ECG REPORT I, Vanessa [place], the attending physician, personally viewed and interpreted this ECG.  EKG sinus tachycardia rate of 140, no ST elevations, some T wave inversions, QTC of 525 ____________________________________________  RADIOLOGY Robert Bellow, personally viewed and evaluated these images (plain radiographs) as part of my medical decision making, as well as reviewing the written report by the radiologist.  ED MD interpretation: No lobar pneumonia  Official radiology report(s): Dg Chest 2 View  Result Date: 05/04/2019  CLINICAL DATA:  Cough and fever EXAM: CHEST - 2 VIEW COMPARISON:  March 31, 2019 FINDINGS: There is slight atelectasis in the right base. The lungs elsewhere clear. Heart is upper normal in size with pulmonary vascularity normal. No adenopathy. No bone lesions. IMPRESSION: Slight right base atelectasis. Earliest changes of pneumonia in this area cannot be excluded. Lungs elsewhere clear. Heart upper normal in size. No adenopathy. Electronically Signed   By: Lowella Grip III M.D.   On: 05/04/2019 12:11    ____________________________________________   PROCEDURES  Procedure(s) performed (including Critical Care):  .Critical Care Performed by: Vanessa [place], MD Authorized by: Vanessa [place], MD   Critical care provider statement:    Critical care time (minutes):  35   Critical care was necessary to treat or prevent imminent or life-threatening deterioration of the following conditions:  Respiratory failure and sepsis   Critical care was time spent personally by me on the following activities:  Discussions with consultants, evaluation of patient's response to treatment, examination of patient, ordering and performing treatments and interventions, ordering and review of laboratory studies, ordering and review of radiographic studies, pulse oximetry, re-evaluation of patient's condition, obtaining history from patient or surrogate and review of old charts     ____________________________________________   INITIAL IMPRESSION / [place] / ED COURSE  Crislyn L Golob was evaluated in Emergency Department on 05/04/2019 for the symptoms described in the history of present illness. She was evaluated in the context of the global COVID-19 pandemic, which necessitated consideration that the patient might be at risk for infection with the SARS-CoV-2 virus that causes COVID-19. Institutional protocols and algorithms that pertain to the evaluation of patients at risk for COVID-19 are in a  state of rapid change based on information released by regulatory bodies including the CDC and federal and state organizations. These policies and algorithms were followed during the patient's care in the ED.     Patient initially presented she met sepsis criteria with elevated heart rate and fever and respiratory rate.  Given her history of significant ARDS and required intubation I did cover patient with broad-spectrum antibiotics.  However I am most concerned that this could just be coronavirus so we will get testing.  We will  get x-ray to evaluate for pneumonia.  Will get urine to evaluate for UTI blood cultures to evaluate for bacteremia.  Initially ordered CT PE given her history of prior pulmonary embolism with her prior infection but patient's pregnancy test resulted back positive.  Patient was already admitted to the hospital team I did discuss this with them.  They were to follow-up on patient's D-dimer and decide if they should continue on with CT or do a VQ scan versus just it being elevated from her known coronavirus.  hCG is too low that ultrasound would not show anything.  Patient was positive for coronavirus.  Will hold off on giving a significant amount of fluids given patient's history of ARDS in the past and the fact that she is coronavirus positive given patient's blood pressure is normal.    ____________________________________________   FINAL CLINICAL IMPRESSION(S) / ED DIAGNOSES   Final diagnoses:  Acute respiratory failure with hypoxia ([place])  COVID-19      MEDICATIONS GIVEN DURING THIS VISIT:  Medications  sodium chloride flush (NS) 0.9 % injection 3 mL (3 mLs Intravenous Not Given 05/04/19 1137)  acetaminophen (TYLENOL) tablet 650 mg (650 mg Oral Given 05/04/19 1111)  sodium chloride 0.9 % bolus 1,000 mL (0 mLs Intravenous Stopped 05/04/19 1430)  vancomycin (VANCOCIN) IVPB 1000 mg/200 mL premix (1,000 mg Intravenous New Bag/Given 05/04/19 1428)  aztreonam  (AZACTAM) 2 g in sodium chloride 0.9 % 100 mL IVPB (0 g Intravenous Stopped 05/04/19 1430)     ED Discharge Orders    None       Note:  This document was prepared using Dragon voice recognition software and may include unintentional dictation errors.   Vanessa [place], MD 05/04/19 [phone]

## 2019-05-05 ENCOUNTER — Inpatient Hospital Stay (HOSPITAL_COMMUNITY)
Admission: AD | Admit: 2019-05-05 | Payer: Medicaid Other | Source: Other Acute Inpatient Hospital | Admitting: Internal Medicine

## 2019-05-05 DIAGNOSIS — O2311 Infections of bladder in pregnancy, first trimester: Secondary | ICD-10-CM

## 2019-05-05 DIAGNOSIS — Z3A01 Less than 8 weeks gestation of pregnancy: Secondary | ICD-10-CM

## 2019-05-05 LAB — GLUCOSE, CAPILLARY: Glucose-Capillary: 119 mg/dL — ABNORMAL HIGH (ref 70–99)

## 2019-05-05 LAB — CBC WITH DIFFERENTIAL/PLATELET
Abs Immature Granulocytes: 0.04 10*3/uL (ref 0.00–0.07)
Basophils Absolute: 0 10*3/uL (ref 0.0–0.1)
Basophils Relative: 0 %
Eosinophils Absolute: 0 10*3/uL (ref 0.0–0.5)
Eosinophils Relative: 0 %
HCT: 35.8 % — ABNORMAL LOW (ref 36.0–46.0)
Hemoglobin: 12.2 g/dL (ref 12.0–15.0)
Immature Granulocytes: 0 %
Lymphocytes Relative: 16 %
Lymphs Abs: 1.6 10*3/uL (ref 0.7–4.0)
MCH: 25.6 pg — ABNORMAL LOW (ref 26.0–34.0)
MCHC: 34.1 g/dL (ref 30.0–36.0)
MCV: 75.1 fL — ABNORMAL LOW (ref 80.0–100.0)
Monocytes Absolute: 0.2 10*3/uL (ref 0.1–1.0)
Monocytes Relative: 2 %
Neutro Abs: 8.2 10*3/uL — ABNORMAL HIGH (ref 1.7–7.7)
Neutrophils Relative %: 82 %
Platelets: 359 10*3/uL (ref 150–400)
RBC: 4.77 MIL/uL (ref 3.87–5.11)
RDW: 16.3 % — ABNORMAL HIGH (ref 11.5–15.5)
WBC: 10 10*3/uL (ref 4.0–10.5)
nRBC: 0 % (ref 0.0–0.2)

## 2019-05-05 LAB — URINE CULTURE

## 2019-05-05 LAB — COMPREHENSIVE METABOLIC PANEL
ALT: 75 U/L — ABNORMAL HIGH (ref 0–44)
AST: 93 U/L — ABNORMAL HIGH (ref 15–41)
Albumin: 3.5 g/dL (ref 3.5–5.0)
Alkaline Phosphatase: 45 U/L (ref 38–126)
Anion gap: 10 (ref 5–15)
BUN: 8 mg/dL (ref 6–20)
CO2: 22 mmol/L (ref 22–32)
Calcium: 8.7 mg/dL — ABNORMAL LOW (ref 8.9–10.3)
Chloride: 106 mmol/L (ref 98–111)
Creatinine, Ser: 0.45 mg/dL (ref 0.44–1.00)
GFR calc Af Amer: >60 mL/min (ref >60)
GFR calc non Af Amer: >60 mL/min (ref >60)
Glucose, Bld: 129 mg/dL — ABNORMAL HIGH (ref 70–99)
Potassium: 3.9 mmol/L (ref 3.5–5.1)
Sodium: 138 mmol/L (ref 135–145)
Total Bilirubin: 0.7 mg/dL (ref 0.3–1.2)
Total Protein: 8.1 g/dL (ref 6.5–8.1)

## 2019-05-05 LAB — PROCALCITONIN: Procalcitonin: <0.1 ng/mL

## 2019-05-05 LAB — BRAIN NATRIURETIC PEPTIDE: B Natriuretic Peptide: 37 pg/mL (ref 0.0–100.0)

## 2019-05-05 LAB — FERRITIN: Ferritin: 120 ng/mL (ref 11–307)

## 2019-05-05 LAB — MAGNESIUM: Magnesium: 2.4 mg/dL (ref 1.7–2.4)

## 2019-05-05 LAB — FIBRIN DERIVATIVES D-DIMER (ARMC ONLY): Fibrin derivatives D-dimer (ARMC): 2395.06 ng/mL (FEU) — ABNORMAL HIGH (ref 0.00–499.00)

## 2019-05-05 LAB — PHOSPHORUS: Phosphorus: 3 mg/dL (ref 2.5–4.6)

## 2019-05-05 LAB — C-REACTIVE PROTEIN: CRP: 22.4 mg/dL — ABNORMAL HIGH (ref <1.0)

## 2019-05-05 MED ORDER — METHYLPREDNISOLONE SODIUM SUCC 40 MG IJ SOLR: 40.0000 mg | Freq: Three times a day (TID) | INTRAMUSCULAR | 0 refills | Status: DC

## 2019-05-05 MED ORDER — ASCORBIC ACID 500 MG PO TABS: 500.0000 mg | ORAL_TABLET | Freq: Every day | ORAL | Status: DC

## 2019-05-05 MED ORDER — ZINC SULFATE 220 (50 ZN) MG PO CAPS: 220.0000 mg | ORAL_CAPSULE | Freq: Every day | ORAL | Status: DC

## 2019-05-05 MED ORDER — ENOXAPARIN SODIUM 60 MG/0.6ML ~~LOC~~ SOLN
55.0000 mg | SUBCUTANEOUS | Status: DC
  Administered 2019-05-05: 55 mg via SUBCUTANEOUS
  Filled 2019-05-05: qty 0.6

## 2019-05-05 NOTE — ED Notes (Signed)
Pt given clean gown and clean pillow cases, chux pad changed. Pt assisted to the bathroom.

## 2019-05-05 NOTE — Progress Notes (Signed)
[place] [facility] [place] NAME: Yesenia Carrillo    MR#:  [id number]  DATE OF BIRTH:  03/05/1996  SUBJECTIVE:  CHIEF COMPLAINT:   Chief Complaint  Patient presents with  . Chills  . Cough  cough +, no other complaints REVIEW OF SYSTEMS:  Review of Systems  Constitutional: Negative for diaphoresis, fever, malaise/fatigue and weight loss.  HENT: Negative for ear discharge, ear pain, hearing loss, nosebleeds, sore throat and tinnitus.   Eyes: Negative for blurred vision and pain.  Respiratory: Positive for cough. Negative for hemoptysis, shortness of breath and wheezing.   Cardiovascular: Negative for chest pain, palpitations, orthopnea and leg swelling.  Gastrointestinal: Negative for abdominal pain, blood in stool, constipation, diarrhea, heartburn, nausea and vomiting.  Genitourinary: Negative for dysuria, frequency and urgency.  Musculoskeletal: Negative for back pain and myalgias.  Skin: Negative for itching and rash.  Neurological: Negative for dizziness, tingling, tremors, focal weakness, seizures, weakness and headaches.  Psychiatric/Behavioral: Negative for depression. The patient is not nervous/anxious.     DRUG ALLERGIES:   Allergies  Allergen Reactions  . Ampicillin Rash and Other (See Comments)    Patient and family cannot remember the specifics of the reaction.  Marland Kitchen Penicillins Hives   VITALS:  Blood pressure 130/76, pulse 97, temperature 98.3 F (36.8 C), resp. rate 16, height 5\' 2"  (1.575 m), weight 115.7 kg, last menstrual period 04/06/2019, SpO2 97 %. PHYSICAL EXAMINATION:  Physical Exam HENT:     Head: Normocephalic and atraumatic.  Eyes:     Conjunctiva/sclera: Conjunctivae normal.     Pupils: Pupils are equal, round, and reactive to light.  Neck:     Musculoskeletal: Normal range of motion and neck supple.     Thyroid: No thyromegaly.     Trachea: No tracheal deviation.  Cardiovascular:     Rate and Rhythm: Normal rate and regular  rhythm.     Heart sounds: Normal heart sounds.  Pulmonary:     Effort: Pulmonary effort is normal. No respiratory distress.     Breath sounds: Normal breath sounds. No wheezing.  Chest:     Chest wall: No tenderness.  Abdominal:     General: Bowel sounds are normal. There is no distension.     Palpations: Abdomen is soft.     Tenderness: There is no abdominal tenderness.  Musculoskeletal: Normal range of motion.  Skin:    General: Skin is warm and dry.     Findings: No rash.  Neurological:     Mental Status: She is alert and oriented to person, place, and time.     Cranial Nerves: No cranial nerve deficit.    LABORATORY PANEL:  Female CBC Recent Labs  Lab 05/05/19 0500  WBC 10.0  HGB 12.2  HCT 35.8*  PLT 359   ------------------------------------------------------------------------------------------------------------------ Chemistries  Recent Labs  Lab 05/05/19 0500  NA 138  K 3.9  CL 106  CO2 22  GLUCOSE 129*  BUN 8  CREATININE 0.45  CALCIUM 8.7*  MG 2.4  AST 93*  ALT 75*  ALKPHOS 45  BILITOT 0.7   RADIOLOGY:  No results found. ASSESSMENT AND PLAN:   Acute respiratory distress secondary to COVID-19 -Oxygen levels-2 L nasal cannula, 92% -Remdesivir-day 2/5 -IV Solu-Medrol day 2/10 -daily ferritin, LDH, CRP, d-dimer, fibrinogen.  Will need to trend this lab daily. -Vitamin C & Zinc. Prone >16hrs/day.  -procalcitonin-negative -Chest x-ray-slight right basilar infiltrate/atelectasis -Supportive care-antitussive, inhalers, I-S/flutter -as D-dimer significantly elevated, will get VQ scan  Acute cystitis during pregnancy -Macrobid twice daily X 5 days  Early pregnancy -Quantitative beta hCG 51.  Very early in pregnancy for ultrasound to show anything.  Discussed with Dr. Kenton Kingfisher from [facility], nothing different to do at this point.  Okay with steroids and remdesivir, also any other pregnancy safe drugs.  Follow-up with OB as an outpatient.    DVT  prophylaxis: Subcutaneous Lovenox   Initially she was planned to go [facility] due to COVID but considering she is pregnancy, they may not take her.   All the records are reviewed and case discussed with Care Management/Social Worker. Management plans discussed with the patient, nursing and they are in agreement.  CODE STATUS: Full Code  TOTAL TIME TAKING CARE OF THIS PATIENT: 35 minutes.   More than 50% of the time was spent in counseling/coordination of care: YES  POSSIBLE D/C IN 4-5 DAYS, DEPENDING ON CLINICAL CONDITION.   Max Sane M.D on 05/05/2019 at 8:19 PM  Between 7am to 6pm - Pager - [phone]  After 6pm go to [email or web address] - password [email or web address]  [facility]   CC: Primary care physician; Center, [place]  Note: This dictation was prepared with Diplomatic Services operational officer dictation along with smaller Company secretary. Any transcriptional errors that result from this process are unintentional.

## 2019-05-05 NOTE — ED Notes (Signed)
Pt given meal tray. Pt up to the bathroom at this time.

## 2019-05-05 NOTE — ED Notes (Signed)
Informed pt of update with transport to [place]. Pt advised it may be a bit longer than expected. Pt verbalizes understanding.

## 2019-05-05 NOTE — Progress Notes (Signed)
PHARMACIST - PHYSICIAN COMMUNICATION  CONCERNING:  Enoxaparin (Lovenox) for DVT Prophylaxis in COVID + Patient   RECOMMENDATION: Patient was prescribed enoxaparin 40mg  q24 hours for VTE prophylaxis.   Filed Weights   05/04/19 1104 05/05/19 0703  Weight: 255 lb (115.7 kg) 255 lb (115.7 kg)    Body mass index is 46.64 kg/m.  Estimated Creatinine Clearance: 132.9 mL/min (by C-G formula based on SCr of 0.45 mg/dL).  Based on [place] patient is candidate for enoxaparin 0.5 mg/kg q 24hour dosing due to BMI being >35.  DESCRIPTION: Pharmacy has adjusted enoxaparin dose per [facility] COVID-19 daily monitoring guidance policy.  Patient is now receiving enoxaparin 55mg  SQ every 24 hours for DVT prophylaxis.   Lu Duffel, PharmD, BCPS Clinical Pharmacist 05/05/2019 10:20 AM

## 2019-05-05 NOTE — ED Notes (Signed)
Report given Horald Chestnut RN at  [facility]. Informed her that [facility] is on there way to pick up patient.

## 2019-05-05 NOTE — ED Notes (Signed)
Pt given meal tray.

## 2019-05-05 NOTE — ED Notes (Signed)
Pt stated she is not a diabetic and has never had to check her sugars. Dr Manuella Ghazi contacted, per Dr Manuella Ghazi, pt does not ned the insulin. Insulin held.

## 2019-05-05 NOTE — ED Notes (Signed)
Dr. Shah at bedside.

## 2019-05-05 NOTE — ED Notes (Signed)
Spoke with pharamacy to bring pt's remdesivir.

## 2019-05-05 NOTE — ED Notes (Addendum)
Pt signed paper consent to be transferred form, form handed to Network engineer.

## 2019-05-05 NOTE — ED Notes (Signed)
Pt resting comfortably at this time. Light turned off per pt request

## 2019-05-06 DIAGNOSIS — U071 COVID-19: Secondary | ICD-10-CM | POA: Diagnosis present

## 2019-05-06 DIAGNOSIS — J1282 Pneumonia due to coronavirus disease 2019: Secondary | ICD-10-CM | POA: Diagnosis present

## 2019-05-06 LAB — COMPREHENSIVE METABOLIC PANEL
ALT: 74 U/L — ABNORMAL HIGH (ref 0–44)
AST: 72 U/L — ABNORMAL HIGH (ref 15–41)
Albumin: 3.5 g/dL (ref 3.5–5.0)
Alkaline Phosphatase: 43 U/L (ref 38–126)
Anion gap: 10 (ref 5–15)
BUN: 11 mg/dL (ref 6–20)
CO2: 23 mmol/L (ref 22–32)
Calcium: 9 mg/dL (ref 8.9–10.3)
Chloride: 107 mmol/L (ref 98–111)
Creatinine, Ser: 0.49 mg/dL (ref 0.44–1.00)
GFR calc Af Amer: >60 mL/min (ref >60)
GFR calc non Af Amer: >60 mL/min (ref >60)
Glucose, Bld: 139 mg/dL — ABNORMAL HIGH (ref 70–99)
Potassium: 4 mmol/L (ref 3.5–5.1)
Sodium: 140 mmol/L (ref 135–145)
Total Bilirubin: 0.5 mg/dL (ref 0.3–1.2)
Total Protein: 8.1 g/dL (ref 6.5–8.1)

## 2019-05-06 LAB — PROCALCITONIN: Procalcitonin: <0.1 ng/mL

## 2019-05-06 LAB — CBC WITH DIFFERENTIAL/PLATELET
Abs Immature Granulocytes: 0.09 10*3/uL — ABNORMAL HIGH (ref 0.00–0.07)
Basophils Absolute: 0 10*3/uL (ref 0.0–0.1)
Basophils Relative: 0 %
Eosinophils Absolute: 0 10*3/uL (ref 0.0–0.5)
Eosinophils Relative: 0 %
HCT: 36.3 % (ref 36.0–46.0)
Hemoglobin: 11.9 g/dL — ABNORMAL LOW (ref 12.0–15.0)
Immature Granulocytes: 1 %
Lymphocytes Relative: 16 %
Lymphs Abs: 2.3 10*3/uL (ref 0.7–4.0)
MCH: 25.4 pg — ABNORMAL LOW (ref 26.0–34.0)
MCHC: 32.8 g/dL (ref 30.0–36.0)
MCV: 77.4 fL — ABNORMAL LOW (ref 80.0–100.0)
Monocytes Absolute: 0.7 10*3/uL (ref 0.1–1.0)
Monocytes Relative: 5 %
Neutro Abs: 10.9 10*3/uL — ABNORMAL HIGH (ref 1.7–7.7)
Neutrophils Relative %: 78 %
Platelets: 397 10*3/uL (ref 150–400)
RBC: 4.69 MIL/uL (ref 3.87–5.11)
RDW: 16.2 % — ABNORMAL HIGH (ref 11.5–15.5)
WBC: 14 10*3/uL — ABNORMAL HIGH (ref 4.0–10.5)
nRBC: 0 % (ref 0.0–0.2)

## 2019-05-06 LAB — C-REACTIVE PROTEIN: CRP: 13.9 mg/dL — ABNORMAL HIGH (ref <1.0)

## 2019-05-06 LAB — FIBRIN DERIVATIVES D-DIMER (ARMC ONLY): Fibrin derivatives D-dimer (ARMC): 748.97 ng/mL (FEU) — ABNORMAL HIGH (ref 0.00–499.00)

## 2019-05-06 LAB — MAGNESIUM: Magnesium: 2.6 mg/dL — ABNORMAL HIGH (ref 1.7–2.4)

## 2019-05-06 LAB — PHOSPHORUS: Phosphorus: 2.9 mg/dL (ref 2.5–4.6)

## 2019-05-06 LAB — FERRITIN: Ferritin: 159 ng/mL (ref 11–307)

## 2019-05-06 MED ORDER — ENOXAPARIN SODIUM 120 MG/0.8ML ~~LOC~~ SOLN
110.0000 mg | Freq: Two times a day (BID) | SUBCUTANEOUS | Status: DC
  Administered 2019-05-06 – 2019-05-08 (×5): 110 mg via SUBCUTANEOUS
  Filled 2019-05-06 (×8): qty 0.8

## 2019-05-06 NOTE — Consult Note (Signed)
ANTICOAGULATION CONSULT NOTE - Initial Consult  Pharmacy Consult for LMWH Treatment dosing Indication: Elevated D-Dimer in COVID+ obese pregnant patient (high risk for VTE)  Allergies  Allergen Reactions  . Ampicillin Rash and Other (See Comments)    Patient and family cannot remember the specifics of the reaction.  Marland Kitchen Penicillins Hives    Patient Measurements: Height: 5\' 2"  (157.5 cm) Weight: 255 lb (115.7 kg) IBW/kg (Calculated) : 50.1  Vital Signs: Temp: 97.7 F (36.5 C) (12/01 0411) Temp Source: Oral (12/01 0411) BP: 135/98 (12/01 0803) Pulse Rate: 93 (12/01 0803)  Labs: Recent Labs    05/04/19 1120 05/04/19 1623 05/04/19 1804 05/05/19 0500 05/06/19 0402  HGB 12.8  --   --  12.2 11.9*  HCT 37.4  --   --  35.8* 36.3  PLT 355  --   --  359 397  APTT 41*  --   --   --   --   LABPROT 13.5  --   --   --   --   INR 1.0  --   --   --   --   CREATININE 0.62  --   --  0.45 0.49  TROPONINIHS  --  26* 25*  --   --     Estimated Creatinine Clearance: 132.9 mL/min (by C-G formula based on SCr of 0.49 mg/dL).   Medical History: Past Medical History:  Diagnosis Date  . Hypertension     Medications:  No PTA anticoagulation of note  Assessment: Pharmacy has been consulted to initiate Full Dose LMWH in this 23 year old pregnant female with BMI 46.6 and total weight of 115.7kg.  Pregnancy/Obesity considerations:   Twice daily dosing is preferred.  Anti-Xa monitoring is preferred for both pregnant and obese patients.    This is a send-off lab at this facility and will be difficult to use for consistent therapeutic monitoring.  Will order an anti-Xa level 4 hours after 4th dose (steady state) and adjust dose as lab information becomes available.  Will monitor for signs/symptoms of bleeding in the meantime.  Goal of Therapy:  Anti-Xa level 0.6-1 units/ml 4hrs after LMWH dose given (4th dose/steady state) Monitor platelets by anticoagulation protocol: Yes   Plan:   Enoxaparin 110mg  SQ q12h  Lu Duffel, PharmD, BCPS Clinical Pharmacist 05/06/2019 8:20 AM

## 2019-05-06 NOTE — ED Notes (Signed)
Meal tray given 

## 2019-05-06 NOTE — Progress Notes (Signed)
PROGRESS NOTE    Yesenia Carrillo  [id number] DOB: September 30, 1995 DOA: 05/04/2019 PCP: [facility] [place] (Confirm with patient/family/NH records and if not entered, this HAS to be entered at [facility] point of entry. "No PCP" if truly none.)     Assessment & Plan:   Principal Problem:   COVID-19 Active Problems:   Pregnancy   Acute cystitis during pregnancy   Pneumonia due to COVID-19 virus   Acute respiratory distress secondary to COVID19: continue on remdesivir day #3/5, steroids day #3/10, vitamin C & zinc. Procalcitonin neg. Continue w/ antitussive, inhalers, incentive spirometry. Continue on supplemental oxygen and wean as tolerated, currently on 2L [place]   Elevated D-dimer: unable to get CTA or V/Q scan secondary to early pregnancy. Started on full dose lovenox  Hx of pulmonary embolus: took 3 months of eliquis and was taken off as per pt  Leukocytosis: likely secondary to infection & steroid use. Will continue to monitor   Transaminitis: likely secondary remdesivir use. Trending down today. Will continue to monitor   Acute cystitis: during pregnancy. Continue on macrobid x 5 days  Early pregnancy: Quantitative beta hCG 51. Previously discussed w/ OB/GYN, Dr. Kenton Kingfisher, by Dr. Manuella Ghazi and nothing to do inpatient at this point. Ok w/ steroids & remdesivir. Will need to f/u w/ OB as an outpatient  Obesity: BMI 46.6. Would benefit from weight loss      DVT prophylaxis: full dose lovenox Code Status: full  Disposition Plan: 2-3 days if pt continues to improve   Consultants:  OB    Antimicrobials: macrobid x 5 days   Subjective: Pt c/o cough and shortness of breath  Objective: Vitals:   05/06/19 0411 05/06/19 0654 05/06/19 0803 05/06/19 0936  BP: (!) 143/93 (!) 127/115 (!) 135/98 (!) 136/94  Pulse: 88 97 93 93  Resp: 18 17 16 17   Temp: 97.7 F (36.5 C)     TempSrc: Oral     SpO2: 97% 95% 96% 99%  Weight:      Height:       No intake or output data  in the 24 hours ending 05/06/19 1711 Filed Weights   05/04/19 1104 05/05/19 0703  Weight: 115.7 kg 115.7 kg    Examination:  General exam: Appears calm and comfortable  Respiratory system: diminished breath sounds b/l. No wheezes.  Cardiovascular system: S1 & S2 normal. No gallops or rubs  Gastrointestinal system: obese, soft and nontender.Hypoactive bowel sounds heard. Central nervous system: Alert and oriented. No focal neurological deficits. Psychiatry: Judgement and insight appear normal. Mood & affect appropriate.     Data Reviewed: I have personally reviewed following labs and imaging studies  CBC: Recent Labs  Lab 05/04/19 1120 05/05/19 0500 05/06/19 0402  WBC 12.2* 10.0 14.0*  NEUTROABS 10.0* 8.2* 10.9*  HGB 12.8 12.2 11.9*  HCT 37.4 35.8* 36.3  MCV 74.5* 75.1* 77.4*  PLT 355 359 [id number]   Basic Metabolic Panel: Recent Labs  Lab 05/04/19 1120 05/05/19 0500 05/06/19 0402  NA 137 138 140  K 3.7 3.9 4.0  CL 102 106 107  CO2 22 22 23   GLUCOSE 101* 129* 139*  BUN 6 8 11   CREATININE 0.62 0.45 0.49  CALCIUM 9.0 8.7* 9.0  MG  --  2.4 2.6*  PHOS  --  3.0 2.9   GFR: Estimated Creatinine Clearance: 132.9 mL/min (by C-G formula based on SCr of 0.49 mg/dL). Liver Function Tests: Recent Labs  Lab 05/04/19 1120 05/05/19 0500 05/06/19 0402  AST  97* 93* 72*  ALT 77* 75* 74*  ALKPHOS 46 45 43  BILITOT 0.7 0.7 0.5  PROT 8.1 8.1 8.1  ALBUMIN 3.8 3.5 3.5   No results for input(s): LIPASE, AMYLASE in the last 168 hours. No results for input(s): AMMONIA in the last 168 hours. Coagulation Profile: Recent Labs  Lab 05/04/19 1120  INR 1.0   Cardiac Enzymes: No results for input(s): CKTOTAL, CKMB, CKMBINDEX, TROPONINI in the last 168 hours. BNP (last 3 results) No results for input(s): PROBNP in the last 8760 hours. HbA1C: Recent Labs    05/04/19 1839  HGBA1C 5.1   CBG: Recent Labs  Lab 05/04/19 1849 05/04/19 2224 05/05/19 0908  GLUCAP 92 136* 119*    Lipid Profile: No results for input(s): CHOL, HDL, LDLCALC, TRIG, CHOLHDL, LDLDIRECT in the last 72 hours. Thyroid Function Tests: No results for input(s): TSH, T4TOTAL, FREET4, T3FREE, THYROIDAB in the last 72 hours. Anemia Panel: Recent Labs    05/05/19 0500 05/06/19 0402  FERRITIN 120 159   Sepsis Labs: Recent Labs  Lab 05/04/19 1120 05/04/19 1259 05/05/19 0500 05/06/19 0402  PROCALCITON <0.10  --  <0.10 <0.10  LATICACIDVEN 1.0 0.8  --   --     Recent Results (from the past 240 hour(s))  Blood Culture (routine x 2)     Status: None (Preliminary result)   Collection Time: 05/04/19  1:23 PM   Specimen: BLOOD  Result Value Ref Range Status   Specimen Description BLOOD RIGHT ANTECUBITAL  Final   Special Requests   Final    BOTTLES DRAWN AEROBIC AND ANAEROBIC Blood Culture results may not be optimal due to an excessive volume of blood received in culture bottles   Culture   Final    NO GROWTH 2 DAYS Performed at [facility], [address], [place], [place] [address]    Report Status PENDING  Incomplete  Blood Culture (routine x 2)     Status: None (Preliminary result)   Collection Time: 05/04/19  1:36 PM   Specimen: BLOOD  Result Value Ref Range Status   Specimen Description BLOOD BLOOD LEFT FOREARM  Final   Special Requests   Final    BOTTLES DRAWN AEROBIC AND ANAEROBIC Blood Culture results may not be optimal due to an excessive volume of blood received in culture bottles   Culture   Final    NO GROWTH 2 DAYS Performed at [facility], [address], [place], [place] [address]    Report Status PENDING  Incomplete  Urine culture     Status: Abnormal   Collection Time: 05/04/19  3:11 PM   Specimen: In/Out Cath Urine  Result Value Ref Range Status   Specimen Description   Final    IN/OUT CATH URINE Performed at [facility], [address] [place], [place] [address]    Special Requests   Final    NONE Performed at [facility], [address] [place], [place] [address]    Culture MULTIPLE SPECIES PRESENT, SUGGEST RECOLLECTION (A)  Final   Report Status 05/05/2019 FINAL  Final         Radiology Studies: No results found.      Scheduled Meds: . enoxaparin (LOVENOX) injection  110 mg Subcutaneous Q12H  . Ipratropium-Albuterol  1 puff Inhalation Q6H  . methylPREDNISolone (SOLU-MEDROL) injection  40 mg Intravenous Q8H  . nitrofurantoin (macrocrystal-monohydrate)  100 mg Oral Q12H  . sodium chloride flush  3 mL Intravenous Once  .  vitamin C  500 mg Oral Daily  . zinc sulfate  220 mg Oral Daily   Continuous Infusions: . remdesivir 100 mg in NS 250 mL Stopped (05/06/19 0832)     LOS: 2 days    Time spent: more than 30 mins     Wyvonnia Dusky, MD [facility] Pager [phone]  If 7PM-7AM, please contact night-coverage [email or web address] Password [email or web address] 05/06/2019, 5:11 PM

## 2019-05-06 NOTE — ED Notes (Signed)
Patient has eaten dinner, is requesting Sprite to drink. Sent to room with Sprite and ice

## 2019-05-06 NOTE — ED Notes (Signed)
ED TO INPATIENT HANDOFF REPORT  ED Nurse Name and Phone #: [place] Levels Name/Age/Gender Yesenia Carrillo 23 y.o. female Room/Bed: ED05A/ED05A  Code Status   Code Status: Full Code  Home/SNF/Other Home Patient oriented to: self, place, time and situation Is this baseline? Yes   Triage Complete: Triage complete  Chief Complaint chills hot dizzy coughing  Triage Note Pt presents to ED via POV with c/o cough and chills. Pt ambulatory to the desk without difficulty. Pt able to speak in complete sentences at this time. Pt denies productive cough. Pt states one minute I'm hot, the next I have chills. Pt with dry cough noted upon arrival at this time.    Allergies Allergies  Allergen Reactions  . Ampicillin Rash and Other (See Comments)    Patient and family cannot remember the specifics of the reaction.  Marland Kitchen Penicillins Hives    Level of Care/Admitting Diagnosis ED Disposition    ED Disposition Condition [facility] Area: [place] [[id number]]  Level of Care: Med-Surg [16]  Covid Evaluation: Confirmed COVID Positive  Diagnosis: Pneumonia due to COVID-19 virus [id number]  Admitting Physician: Wyvonnia Dusky [id number]  Attending Physician: Wyvonnia Dusky [id number]  Estimated length of stay: 3 - 4 days  Certification:: I certify this patient will need inpatient services for at least 2 midnights  PT Class (Do Not Modify): Inpatient [101]  PT Acc Code (Do Not Modify): Private [1]       B Medical/Surgery History Past Medical History:  Diagnosis Date  . Hypertension    Past Surgical History:  Procedure Laterality Date  . TONSILLECTOMY    . WISDOM TOOTH EXTRACTION       A IV Location/Drains/Wounds Patient Lines/Drains/Airways Status   Active Line/Drains/Airways    Name:   Placement date:   Placement time:   Site:   Days:   Peripheral IV 05/04/19 Left;Lateral Antecubital   05/04/19    1120    Antecubital   2   Peripheral IV  05/04/19 Left Forearm   05/04/19    1333    Forearm   2          Intake/Output Last 24 hours No intake or output data in the 24 hours ending 05/06/19 1922  Labs/Imaging Results for orders placed or performed during the hospital encounter of 05/04/19 (from the past 48 hour(s))  Glucose, capillary     Status: Abnormal   Collection Time: 05/04/19 10:24 PM  Result Value Ref Range   Glucose-Capillary 136 (H) 70 - 99 mg/dL  Procalcitonin     Status: None   Collection Time: 05/05/19  5:00 AM  Result Value Ref Range   Procalcitonin <0.10 ng/mL    Comment:        Interpretation: PCT (Procalcitonin) <= 0.5 ng/mL: Systemic infection (sepsis) is not likely. Local bacterial infection is possible. (NOTE)       Sepsis PCT Algorithm           Lower Respiratory Tract                                      Infection PCT Algorithm    ----------------------------     ----------------------------         PCT < 0.25 ng/mL                PCT < 0.10 ng/mL  Strongly encourage             Strongly discourage   discontinuation of antibiotics    initiation of antibiotics    ----------------------------     -----------------------------       PCT 0.25 - 0.50 ng/mL            PCT 0.10 - 0.25 ng/mL               OR       >80% decrease in PCT            Discourage initiation of                                            antibiotics      Encourage discontinuation           of antibiotics    ----------------------------     -----------------------------         PCT >= 0.50 ng/mL              PCT 0.26 - 0.50 ng/mL               AND        <80% decrease in PCT             Encourage initiation of                                             antibiotics       Encourage continuation           of antibiotics    ----------------------------     -----------------------------        PCT >= 0.50 ng/mL                  PCT > 0.50 ng/mL               AND         increase in PCT                  Strongly  encourage                                      initiation of antibiotics    Strongly encourage escalation           of antibiotics                                     -----------------------------                                           PCT <= 0.25 ng/mL                                                 OR                                        >  80% decrease in PCT                                     Discontinue / Do not initiate                                             antibiotics Performed at [facility], [address] [place], [place] [address]   CBC with Differential/Platelet     Status: Abnormal   Collection Time: 05/05/19  5:00 AM  Result Value Ref Range   WBC 10.0 4.0 - 10.5 K/uL   RBC 4.77 3.87 - 5.11 MIL/uL   Hemoglobin 12.2 12.0 - 15.0 g/dL   HCT 35.8 (L) 36.0 - 46.0 %   MCV 75.1 (L) 80.0 - 100.0 fL   MCH 25.6 (L) 26.0 - 34.0 pg   MCHC 34.1 30.0 - 36.0 g/dL   RDW 16.3 (H) 11.5 - 15.5 %   Platelets 359 150 - 400 K/uL   nRBC 0.0 0.0 - 0.2 %   Neutrophils Relative % 82 %   Neutro Abs 8.2 (H) 1.7 - 7.7 K/uL   Lymphocytes Relative 16 %   Lymphs Abs 1.6 0.7 - 4.0 K/uL   Monocytes Relative 2 %   Monocytes Absolute 0.2 0.1 - 1.0 K/uL   Eosinophils Relative 0 %   Eosinophils Absolute 0.0 0.0 - 0.5 K/uL   Basophils Relative 0 %   Basophils Absolute 0.0 0.0 - 0.1 K/uL   Immature Granulocytes 0 %   Abs Immature Granulocytes 0.04 0.00 - 0.07 K/uL    Comment: Performed at [facility], [address] [place], [place] [address]  Comprehensive metabolic panel     Status: Abnormal   Collection Time: 05/05/19  5:00 AM  Result Value Ref Range   Sodium 138 135 - 145 mmol/L   Potassium 3.9 3.5 - 5.1 mmol/L   Chloride 106 98 - 111 mmol/L   CO2 22 22 - 32 mmol/L   Glucose, Bld 129 (H) 70 - 99 mg/dL   BUN 8 6 - 20 mg/dL   Creatinine, Ser 0.45 0.44 - 1.00 mg/dL   Calcium 8.7 (L) 8.9 - 10.3 mg/dL   Total Protein 8.1 6.5 - 8.1 g/dL   Albumin 3.5 3.5 - 5.0  g/dL   AST 93 (H) 15 - 41 U/L   ALT 75 (H) 0 - 44 U/L   Alkaline Phosphatase 45 38 - 126 U/L   Total Bilirubin 0.7 0.3 - 1.2 mg/dL   GFR calc non Af Amer >60 >60 mL/min   GFR calc Af Amer >60 >60 mL/min   Anion gap 10 5 - 15    Comment: Performed at [facility], [address] [place], [place] [address]  Fibrin derivatives D-Dimer [facility] only)     Status: Abnormal   Collection Time: 05/05/19  5:00 AM  Result Value Ref Range   Fibrin derivatives D-dimer ([facility]) 2,395.06 (H) 0.00 - 499.00 ng/mL (FEU)    Comment: (NOTE) <> Exclusion of Venous Thromboembolism (VTE) - OUTPATIENT ONLY   (Emergency Department or [place])   0-499 ng/ml (FEU): With a low to intermediate pretest probability                      for VTE  this test result excludes the diagnosis                      of VTE.   >499 ng/ml (FEU) : VTE not excluded; additional work up for VTE is                      required. <> Testing on Inpatients and Evaluation of Disseminated Intravascular   Coagulation (DIC) Reference Range:   0-499 ng/ml (FEU) Performed at [facility], [address] [place], [place] [address]   Ferritin     Status: None   Collection Time: 05/05/19  5:00 AM  Result Value Ref Range   Ferritin 120 11 - 307 ng/mL    Comment: Performed at [facility], [address] [place], [place] [address]  Magnesium     Status: None   Collection Time: 05/05/19  5:00 AM  Result Value Ref Range   Magnesium 2.4 1.7 - 2.4 mg/dL    Comment: Performed at [facility], [address] [place], [place] [address]  Phosphorus     Status: None   Collection Time: 05/05/19  5:00 AM  Result Value Ref Range   Phosphorus 3.0 2.5 - 4.6 mg/dL    Comment: Performed at [facility], [address] [place], [place] [address]  C-reactive protein     Status: Abnormal   Collection Time: 05/05/19  7:22 AM  Result Value Ref Range   CRP 22.4 (H) <1.0 mg/dL    Comment: Performed at [facility], [place] [address] [place], [place] [address]  BNP     Status: None   Collection Time: 05/05/19  7:22 AM  Result Value Ref Range   B Natriuretic Peptide 37.0 0.0 - 100.0 pg/mL    Comment: Performed at [facility], [address] [place], [place] [address]  Glucose, capillary     Status: Abnormal   Collection Time: 05/05/19  9:08 AM  Result Value Ref Range   Glucose-Capillary 119 (H) 70 - 99 mg/dL  Procalcitonin     Status: None   Collection Time: 05/06/19  4:02 AM  Result Value Ref Range   Procalcitonin <0.10 ng/mL    Comment:        Interpretation: PCT (Procalcitonin) <= 0.5 ng/mL: Systemic infection (sepsis) is not likely. Local bacterial infection is possible. (NOTE)       Sepsis PCT Algorithm           Lower Respiratory Tract                                      Infection PCT Algorithm    ----------------------------     ----------------------------         PCT < 0.25 ng/mL                PCT < 0.10 ng/mL         Strongly encourage             Strongly discourage   discontinuation of antibiotics    initiation of antibiotics    ----------------------------     -----------------------------       PCT 0.25 - 0.50 ng/mL            PCT 0.10 - 0.25 ng/mL  OR       >80% decrease in PCT            Discourage initiation of                                            antibiotics      Encourage discontinuation           of antibiotics    ----------------------------     -----------------------------         PCT >= 0.50 ng/mL              PCT 0.26 - 0.50 ng/mL               AND        <80% decrease in PCT             Encourage initiation of                                             antibiotics       Encourage continuation           of antibiotics    ----------------------------     -----------------------------        PCT >= 0.50 ng/mL                  PCT > 0.50 ng/mL               AND         increase in PCT                  Strongly  encourage                                      initiation of antibiotics    Strongly encourage escalation           of antibiotics                                     -----------------------------                                           PCT <= 0.25 ng/mL                                                 OR                                        > 80% decrease in PCT                                     Discontinue / Do not initiate  antibiotics Performed at [facility], [address] [place], [place] [address]   CBC with Differential/Platelet     Status: Abnormal   Collection Time: 05/06/19  4:02 AM  Result Value Ref Range   WBC 14.0 (H) 4.0 - 10.5 K/uL   RBC 4.69 3.87 - 5.11 MIL/uL   Hemoglobin 11.9 (L) 12.0 - 15.0 g/dL   HCT 36.3 36.0 - 46.0 %   MCV 77.4 (L) 80.0 - 100.0 fL   MCH 25.4 (L) 26.0 - 34.0 pg   MCHC 32.8 30.0 - 36.0 g/dL   RDW 16.2 (H) 11.5 - 15.5 %   Platelets 397 150 - 400 K/uL   nRBC 0.0 0.0 - 0.2 %   Neutrophils Relative % 78 %   Neutro Abs 10.9 (H) 1.7 - 7.7 K/uL   Lymphocytes Relative 16 %   Lymphs Abs 2.3 0.7 - 4.0 K/uL   Monocytes Relative 5 %   Monocytes Absolute 0.7 0.1 - 1.0 K/uL   Eosinophils Relative 0 %   Eosinophils Absolute 0.0 0.0 - 0.5 K/uL   Basophils Relative 0 %   Basophils Absolute 0.0 0.0 - 0.1 K/uL   Immature Granulocytes 1 %   Abs Immature Granulocytes 0.09 (H) 0.00 - 0.07 K/uL    Comment: Performed at [facility], [address] [place], [place] [address]  Comprehensive metabolic panel     Status: Abnormal   Collection Time: 05/06/19  4:02 AM  Result Value Ref Range   Sodium 140 135 - 145 mmol/L   Potassium 4.0 3.5 - 5.1 mmol/L   Chloride 107 98 - 111 mmol/L   CO2 23 22 - 32 mmol/L   Glucose, Bld 139 (H) 70 - 99 mg/dL   BUN 11 6 - 20 mg/dL   Creatinine, Ser 0.49 0.44 - 1.00 mg/dL   Calcium 9.0 8.9 - 10.3 mg/dL   Total Protein 8.1 6.5 - 8.1 g/dL   Albumin 3.5 3.5  - 5.0 g/dL   AST 72 (H) 15 - 41 U/L   ALT 74 (H) 0 - 44 U/L   Alkaline Phosphatase 43 38 - 126 U/L   Total Bilirubin 0.5 0.3 - 1.2 mg/dL   GFR calc non Af Amer >60 >60 mL/min   GFR calc Af Amer >60 >60 mL/min   Anion gap 10 5 - 15    Comment: Performed at [facility], [address] [place], [place] [address]  C-reactive protein     Status: Abnormal   Collection Time: 05/06/19  4:02 AM  Result Value Ref Range   CRP 13.9 (H) <1.0 mg/dL    Comment: Performed at [facility], [address], [place], [place] [address]  Fibrin derivatives D-Dimer [facility] only)     Status: Abnormal   Collection Time: 05/06/19  4:02 AM  Result Value Ref Range   Fibrin derivatives D-dimer ([facility]) 748.97 (H) 0.00 - 499.00 ng/mL (FEU)    Comment: (NOTE) <> Exclusion of Venous Thromboembolism (VTE) - OUTPATIENT ONLY   (Emergency Department or [place])   0-499 ng/ml (FEU): With a low to intermediate pretest probability                      for VTE this test result excludes the diagnosis                      of VTE.   >499 ng/ml (FEU) : VTE not excluded; additional work up for  VTE is                      required. <> Testing on Inpatients and Evaluation of Disseminated Intravascular   Coagulation (DIC) Reference Range:   0-499 ng/ml (FEU) Performed at [facility], [address] [place], [place] [address]   Ferritin     Status: None   Collection Time: 05/06/19  4:02 AM  Result Value Ref Range   Ferritin 159 11 - 307 ng/mL    Comment: Performed at [facility], [address] [place], [place] [address]  Magnesium     Status: Abnormal   Collection Time: 05/06/19  4:02 AM  Result Value Ref Range   Magnesium 2.6 (H) 1.7 - 2.4 mg/dL    Comment: Performed at [facility], [address] [place], [place] [address]  Phosphorus     Status: None   Collection Time: 05/06/19  4:02 AM  Result Value Ref Range   Phosphorus 2.9 2.5 - 4.6 mg/dL    Comment: Performed at  [facility], [address] [place], [place] [address]   No results found.  Pending Labs Unresulted Labs (From admission, onward)    Start     Ordered   05/05/19 0500  CBC with Differential/Platelet  Daily,   STAT     05/04/19 1655   05/05/19 0500  Comprehensive metabolic panel  Daily,   STAT     05/04/19 1655   05/05/19 0500  C-reactive protein  Daily,   STAT     05/04/19 1655   05/05/19 0500  Fibrin derivatives D-Dimer ([facility] only)  Daily,   STAT     05/04/19 1655   05/05/19 0500  Ferritin  Daily,   STAT     05/04/19 1655   05/05/19 0500  Magnesium  Daily,   STAT     05/04/19 1655   05/05/19 0500  Phosphorus  Daily,   STAT     05/04/19 1655   05/04/19 1640  Fibrin derivatives D-Dimer ([facility] only)  Once,   STAT     05/04/19 1640          Vitals/Pain Today's Vitals   05/06/19 0936 05/06/19 1505 05/06/19 1829 05/06/19 1915  BP: (!) 136/94  120/74   Pulse: 93  87 87  Resp: 17  16   Temp:      TempSrc:      SpO2: 99%  99% 100%  Weight:      Height:      PainSc:  0-No pain      Isolation Precautions Airborne and Contact precautions  Medications Medications  sodium chloride flush (NS) 0.9 % injection 3 mL (3 mLs Intravenous Not Given 05/04/19 1137)  guaiFENesin-dextromethorphan (ROBITUSSIN DM) 100-10 MG/5ML syrup 10 mL (has no administration in time range)  chlorpheniramine-HYDROcodone (TUSSIONEX) 10-8 MG/5ML suspension 5 mL (has no administration in time range)  Ipratropium-Albuterol (COMBIVENT) respimat 1 puff (1 puff Inhalation Given 05/06/19 1407)  vitamin C (ASCORBIC ACID) tablet 500 mg (500 mg Oral Given 05/06/19 0939)  zinc sulfate capsule 220 mg (220 mg Oral Given 05/06/19 0939)  acetaminophen (TYLENOL) tablet 650 mg (650 mg Oral Given 05/04/19 1859)  HYDROcodone-acetaminophen (NORCO/VICODIN) 5-325 MG per tablet 1-2 tablet (has no administration in time range)  senna-docusate (Senokot-S) tablet 1 tablet (has no administration in time range)   methylPREDNISolone sodium succinate (SOLU-MEDROL) 40 mg/mL injection 40 mg (40 mg Intravenous Given 05/06/19 1406)  nitrofurantoin (macrocrystal-monohydrate) (MACROBID) capsule  100 mg (100 mg Oral Given 05/06/19 0939)  remdesivir 200 mg in sodium chloride 0.9 % 250 mL IVPB ( Intravenous Stopped 05/04/19 2307)    Followed by  remdesivir 100 mg in sodium chloride 0.9 % 250 mL IVPB (0 mg Intravenous Stopped 05/06/19 0832)  enoxaparin (LOVENOX) injection 110 mg (110 mg Subcutaneous Given 05/06/19 0940)  acetaminophen (TYLENOL) tablet 650 mg (650 mg Oral Given 05/04/19 1111)  sodium chloride 0.9 % bolus 1,000 mL (0 mLs Intravenous Stopped 05/04/19 1430)  vancomycin (VANCOCIN) IVPB 1000 mg/200 mL premix (0 mg Intravenous Stopped 05/04/19 1528)  aztreonam (AZACTAM) 2 g in sodium chloride 0.9 % 100 mL IVPB (0 g Intravenous Stopped 05/04/19 1430)  sodium chloride 0.9 % bolus 1,000 mL (0 mLs Intravenous Stopped 05/04/19 1909)    Mobility walks with person assist Low fall risk   Focused Assessments Pulmonary Assessment Handoff:  Lung sounds: Bilateral Breath Sounds: Clear L Breath Sounds: Clear R Breath Sounds: Clear O2 Device: Nasal Cannula O2 Flow Rate (L/min): 2 L/min      R Recommendations: See Admitting Provider Note  Report given to:   Additional Notes:

## 2019-05-06 NOTE — ED Notes (Signed)
Pt given clean undergarments and pads. Pt denies any further needs at this time.

## 2019-05-06 NOTE — ED Provider Notes (Signed)
-----------------------------------------   7:18 AM on 05/06/2019 -----------------------------------------   Blood pressure (!) 127/115, pulse 97, temperature 97.7 F (36.5 C), temperature source Oral, resp. rate 17, height 5\' 2"  (1.575 m), weight 115.7 kg, SpO2 95 %.  Patient is stable, has been admitted to the hospitalist service and is waiting for a bed.    Arta Silence, MD 05/06/19 [phone]

## 2019-05-07 LAB — COMPREHENSIVE METABOLIC PANEL
ALT: 71 U/L — ABNORMAL HIGH (ref 0–44)
AST: 54 U/L — ABNORMAL HIGH (ref 15–41)
Albumin: 3.4 g/dL — ABNORMAL LOW (ref 3.5–5.0)
Alkaline Phosphatase: 39 U/L (ref 38–126)
Anion gap: 8 (ref 5–15)
BUN: 14 mg/dL (ref 6–20)
CO2: 25 mmol/L (ref 22–32)
Calcium: 8.8 mg/dL — ABNORMAL LOW (ref 8.9–10.3)
Chloride: 107 mmol/L (ref 98–111)
Creatinine, Ser: 0.4 mg/dL — ABNORMAL LOW (ref 0.44–1.00)
GFR calc Af Amer: >60 mL/min (ref >60)
GFR calc non Af Amer: >60 mL/min (ref >60)
Glucose, Bld: 118 mg/dL — ABNORMAL HIGH (ref 70–99)
Potassium: 4.3 mmol/L (ref 3.5–5.1)
Sodium: 140 mmol/L (ref 135–145)
Total Bilirubin: 0.4 mg/dL (ref 0.3–1.2)
Total Protein: 7.6 g/dL (ref 6.5–8.1)

## 2019-05-07 LAB — CBC WITH DIFFERENTIAL/PLATELET
Abs Immature Granulocytes: 0.1 10*3/uL — ABNORMAL HIGH (ref 0.00–0.07)
Basophils Absolute: 0 10*3/uL (ref 0.0–0.1)
Basophils Relative: 0 %
Eosinophils Absolute: 0 10*3/uL (ref 0.0–0.5)
Eosinophils Relative: 0 %
HCT: 38 % (ref 36.0–46.0)
Hemoglobin: 13 g/dL (ref 12.0–15.0)
Immature Granulocytes: 1 %
Lymphocytes Relative: 18 %
Lymphs Abs: 3.1 10*3/uL (ref 0.7–4.0)
MCH: 25.7 pg — ABNORMAL LOW (ref 26.0–34.0)
MCHC: 34.2 g/dL (ref 30.0–36.0)
MCV: 75.1 fL — ABNORMAL LOW (ref 80.0–100.0)
Monocytes Absolute: 0.5 10*3/uL (ref 0.1–1.0)
Monocytes Relative: 3 %
Neutro Abs: 13.5 10*3/uL — ABNORMAL HIGH (ref 1.7–7.7)
Neutrophils Relative %: 78 %
Platelets: 470 10*3/uL — ABNORMAL HIGH (ref 150–400)
RBC: 5.06 MIL/uL (ref 3.87–5.11)
RDW: 16.1 % — ABNORMAL HIGH (ref 11.5–15.5)
WBC: 17.2 10*3/uL — ABNORMAL HIGH (ref 4.0–10.5)
nRBC: 0 % (ref 0.0–0.2)

## 2019-05-07 LAB — FERRITIN: Ferritin: 149 ng/mL (ref 11–307)

## 2019-05-07 LAB — PHOSPHORUS: Phosphorus: 3.6 mg/dL (ref 2.5–4.6)

## 2019-05-07 LAB — C-REACTIVE PROTEIN: CRP: 4.6 mg/dL — ABNORMAL HIGH (ref <1.0)

## 2019-05-07 LAB — MAGNESIUM: Magnesium: 2.5 mg/dL — ABNORMAL HIGH (ref 1.7–2.4)

## 2019-05-07 LAB — FIBRIN DERIVATIVES D-DIMER (ARMC ONLY): Fibrin derivatives D-dimer (ARMC): 609.62 ng/mL (FEU) — ABNORMAL HIGH (ref 0.00–499.00)

## 2019-05-07 NOTE — Progress Notes (Signed)
PROGRESS NOTE   Yesenia Carrillo  [id number] DOB: 1995-10-29 DOA: 05/04/2019 [facility]CP: [facility]      Assessment & Plan:   Principal Problem:   COVID-19 Active Problems:   Pregnancy   Acute cystitis during pregnancy   Pneumonia due to COVID-19 virus   Acute respiratory distress secondary to COVID19: improving. Continue on remdesivir day #4/5, steroids day #4/10, vitamin C & zinc. Procalcitonin neg. Continue w/ antitussive, inhalers, incentive spirometry. Continue on supplemental oxygen and wean as tolerated, currently on 2L [place]   Elevated D-dimer: unable to get CTA or V/Q scan secondary to early pregnancy. Continue on full dose lovenox as NOACs & warfarin contraindicated during pregnancy   Hx of pulmonary embolus: took 3 months of eliquis and was taken off as per pt  Leukocytosis: likely secondary to infection & steroid use. Will continue to monitor   Transaminitis: likely secondary remdesivir use. Continues to trend down. Will continue to monitor   Acute cystitis: during pregnancy. Continue on macrobid x 5 days  Early pregnancy: Quantitative beta hCG 51. Previously discussed w/ OB/GYN, Dr. Kenton Kingfisher, by Dr. Manuella Ghazi and nothing to do inpatient at this point. Ok w/ steroids & remdesivir. Will need to f/u w/ OB as an outpatient  Obesity: BMI 46.6. Would benefit from weight loss    DVT prophylaxis: full dose lovenox Code Status: full     Consultants:   OB   Procedures:    Antimicrobials: macrobid x 5 days   Subjective: Pt c/o shortness of breath intermittently but improved from day prior.  Objective: Vitals:   05/06/19 2010 05/06/19 2248 05/06/19 2252 05/07/19 0636  BP: 123/86   133/87  Pulse: 89 92 87 84  Resp: (!) 22   18  Temp: 97.9 F (36.6 C)   (!) 97.5 F (36.4 C)  TempSrc: Oral   Oral  SpO2: 93% 92% 99% 100%  Weight:      Height:        Intake/Output Summary (Last 24 hours) at 05/07/2019 0732 Last data filed at 05/07/2019  0641 Gross per 24 hour  Intake 180 ml  Output 225 ml  Net -45 ml   Filed Weights   05/04/19 1104 05/05/19 0703  Weight: 115.7 kg 115.7 kg    Examination:  General exam: Appears calm and comfortable  Respiratory system: decreased breath sounds b/l. No wheezes or rales  Cardiovascular system: S1 & S2 normal. No rubs or gallops Gastrointestinal system: Abdomen is obese. soft and nontender.Normal bowel sounds heard. Central nervous system: Alert and oriented. Moves all 4 extremities  Psychiatry: Judgement and insight appear normal. Mood & affect appropriate.     Data Reviewed: I have personally reviewed following labs and imaging studies  CBC: Recent Labs  Lab 05/04/19 1120 05/05/19 0500 05/06/19 0402 05/07/19 0523  WBC 12.2* 10.0 14.0* 17.2*  NEUTROABS 10.0* 8.2* 10.9* 13.5*  HGB 12.8 12.2 11.9* 13.0  HCT 37.4 35.8* 36.3 38.0  MCV 74.5* 75.1* 77.4* 75.1*  PLT 355 359 397 [id number]   Basic Metabolic Panel: Recent Labs  Lab 05/04/19 1120 05/05/19 0500 05/06/19 0402 05/07/19 0523  NA 137 138 140 140  K 3.7 3.9 4.0 4.3  CL 102 106 107 107  CO2 22 22 23 25   GLUCOSE 101* 129* 139* 118*  BUN 6 8 11 14   CREATININE 0.62 0.45 0.49 0.40*  CALCIUM 9.0 8.7* 9.0 8.8*  MG  --  2.4 2.6* 2.5*  PHOS  --  3.0 2.9 3.6  GFR: Estimated Creatinine Clearance: 132.9 mL/min (A) (by C-G formula based on SCr of 0.4 mg/dL (L)). Liver Function Tests: Recent Labs  Lab 05/04/19 1120 05/05/19 0500 05/06/19 0402 05/07/19 0523  AST 97* 93* 72* 54*  ALT 77* 75* 74* 71*  ALKPHOS 46 45 43 39  BILITOT 0.7 0.7 0.5 0.4  PROT 8.1 8.1 8.1 7.6  ALBUMIN 3.8 3.5 3.5 3.4*   No results for input(s): LIPASE, AMYLASE in the last 168 hours. No results for input(s): AMMONIA in the last 168 hours. Coagulation Profile: Recent Labs  Lab 05/04/19 1120  INR 1.0   Cardiac Enzymes: No results for input(s): CKTOTAL, CKMB, CKMBINDEX, TROPONINI in the last 168 hours. BNP (last 3 results) No results for  input(s): PROBNP in the last 8760 hours. HbA1C: Recent Labs    05/04/19 1839  HGBA1C 5.1   CBG: Recent Labs  Lab 05/04/19 1849 05/04/19 2224 05/05/19 0908  GLUCAP 92 136* 119*   Lipid Profile: No results for input(s): CHOL, HDL, LDLCALC, TRIG, CHOLHDL, LDLDIRECT in the last 72 hours. Thyroid Function Tests: No results for input(s): TSH, T4TOTAL, FREET4, T3FREE, THYROIDAB in the last 72 hours. Anemia Panel: Recent Labs    05/06/19 0402 05/07/19 0523  FERRITIN 159 149   Sepsis Labs: Recent Labs  Lab 05/04/19 1120 05/04/19 1259 05/05/19 0500 05/06/19 0402  PROCALCITON <0.10  --  <0.10 <0.10  LATICACIDVEN 1.0 0.8  --   --     Recent Results (from the past 240 hour(s))  Blood Culture (routine x 2)     Status: None (Preliminary result)   Collection Time: 05/04/19  1:23 PM   Specimen: BLOOD  Result Value Ref Range Status   Specimen Description BLOOD RIGHT ANTECUBITAL  Final   Special Requests   Final    BOTTLES DRAWN AEROBIC AND ANAEROBIC Blood Culture results may not be optimal due to an excessive volume of blood received in culture bottles   Culture   Final    NO GROWTH 2 DAYS Performed at [facility], [address], [place], [place] [address]    Report Status PENDING  Incomplete  Blood Culture (routine x 2)     Status: None (Preliminary result)   Collection Time: 05/04/19  1:36 PM   Specimen: BLOOD  Result Value Ref Range Status   Specimen Description BLOOD BLOOD LEFT FOREARM  Final   Special Requests   Final    BOTTLES DRAWN AEROBIC AND ANAEROBIC Blood Culture results may not be optimal due to an excessive volume of blood received in culture bottles   Culture   Final    NO GROWTH 2 DAYS Performed at [facility], [address], [place], [place] [address]    Report Status PENDING  Incomplete  Urine culture     Status: Abnormal   Collection Time: 05/04/19  3:11 PM   Specimen: In/Out Cath Urine  Result Value Ref Range Status    Specimen Description   Final    IN/OUT CATH URINE Performed at [facility], [address] [place], [place] [address]    Special Requests   Final    NONE Performed at [facility], [address] [place], [place] [address]    Culture MULTIPLE SPECIES PRESENT, SUGGEST RECOLLECTION (A)  Final   Report Status 05/05/2019 FINAL  Final         Radiology Studies: No results found.      Scheduled Meds: . enoxaparin (LOVENOX) injection  110 mg Subcutaneous  Q12H  . Ipratropium-Albuterol  1 puff Inhalation Q6H  . methylPREDNISolone (SOLU-MEDROL) injection  40 mg Intravenous Q8H  . vitamin C  500 mg Oral Daily  . zinc sulfate  220 mg Oral Daily   Continuous Infusions: . remdesivir 100 mg in NS 250 mL Stopped (05/06/19 0832)     LOS: 3 days    Time spent: more than 30 mins     Wyvonnia Dusky, MD [facility] Pager [phone]  If 7PM-7AM, please contact night-coverage [email or web address] Password [facility] 05/07/2019, 7:32 AM

## 2019-05-08 LAB — CBC WITH DIFFERENTIAL/PLATELET
Abs Immature Granulocytes: 0.21 10*3/uL — ABNORMAL HIGH (ref 0.00–0.07)
Basophils Absolute: 0 10*3/uL (ref 0.0–0.1)
Basophils Relative: 0 %
Eosinophils Absolute: 0 10*3/uL (ref 0.0–0.5)
Eosinophils Relative: 0 %
HCT: 39.5 % (ref 36.0–46.0)
Hemoglobin: 13.3 g/dL (ref 12.0–15.0)
Immature Granulocytes: 1 %
Lymphocytes Relative: 19 %
Lymphs Abs: 4.3 10*3/uL — ABNORMAL HIGH (ref 0.7–4.0)
MCH: 25.1 pg — ABNORMAL LOW (ref 26.0–34.0)
MCHC: 33.7 g/dL (ref 30.0–36.0)
MCV: 74.7 fL — ABNORMAL LOW (ref 80.0–100.0)
Monocytes Absolute: 1.1 10*3/uL — ABNORMAL HIGH (ref 0.1–1.0)
Monocytes Relative: 5 %
Neutro Abs: 17.7 10*3/uL — ABNORMAL HIGH (ref 1.7–7.7)
Neutrophils Relative %: 75 %
Platelets: 511 10*3/uL — ABNORMAL HIGH (ref 150–400)
RBC: 5.29 MIL/uL — ABNORMAL HIGH (ref 3.87–5.11)
RDW: 16 % — ABNORMAL HIGH (ref 11.5–15.5)
WBC: 23.4 10*3/uL — ABNORMAL HIGH (ref 4.0–10.5)
nRBC: 0 % (ref 0.0–0.2)

## 2019-05-08 LAB — FIBRIN DERIVATIVES D-DIMER (ARMC ONLY): Fibrin derivatives D-dimer (ARMC): 426.28 ng/mL (FEU) (ref 0.00–499.00)

## 2019-05-08 LAB — COMPREHENSIVE METABOLIC PANEL
ALT: 65 U/L — ABNORMAL HIGH (ref 0–44)
AST: 47 U/L — ABNORMAL HIGH (ref 15–41)
Albumin: 3.3 g/dL — ABNORMAL LOW (ref 3.5–5.0)
Alkaline Phosphatase: 40 U/L (ref 38–126)
Anion gap: 10 (ref 5–15)
BUN: 14 mg/dL (ref 6–20)
CO2: 24 mmol/L (ref 22–32)
Calcium: 8.7 mg/dL — ABNORMAL LOW (ref 8.9–10.3)
Chloride: 105 mmol/L (ref 98–111)
Creatinine, Ser: 0.41 mg/dL — ABNORMAL LOW (ref 0.44–1.00)
GFR calc Af Amer: >60 mL/min (ref >60)
GFR calc non Af Amer: >60 mL/min (ref >60)
Glucose, Bld: 114 mg/dL — ABNORMAL HIGH (ref 70–99)
Potassium: 4.1 mmol/L (ref 3.5–5.1)
Sodium: 139 mmol/L (ref 135–145)
Total Bilirubin: 0.4 mg/dL (ref 0.3–1.2)
Total Protein: 7.5 g/dL (ref 6.5–8.1)

## 2019-05-08 LAB — MAGNESIUM: Magnesium: 2.6 mg/dL — ABNORMAL HIGH (ref 1.7–2.4)

## 2019-05-08 LAB — IRON AND TIBC
Iron: 59 ug/dL (ref 28–170)
Saturation Ratios: 26 % (ref 10.4–31.8)
TIBC: 225 ug/dL — ABNORMAL LOW (ref 250–450)
UIBC: 166 ug/dL

## 2019-05-08 LAB — FERRITIN: Ferritin: 116 ng/mL (ref 11–307)

## 2019-05-08 LAB — C-REACTIVE PROTEIN: CRP: 1.8 mg/dL — ABNORMAL HIGH (ref <1.0)

## 2019-05-08 LAB — PHOSPHORUS: Phosphorus: 3.5 mg/dL (ref 2.5–4.6)

## 2019-05-08 MED ORDER — ZINC SULFATE 220 (50 ZN) MG PO CAPS: 220.0000 mg | ORAL_CAPSULE | Freq: Every day | ORAL | Status: DC

## 2019-05-08 MED ORDER — ENOXAPARIN SODIUM 120 MG/0.8ML ~~LOC~~ SOLN: 110.0000 mg | Freq: Two times a day (BID) | SUBCUTANEOUS | 0 refills | Status: DC

## 2019-05-08 MED ORDER — METHYLDOPA 250 MG PO TABS: 250.0000 mg | ORAL_TABLET | Freq: Every day | ORAL | 0 refills | Status: DC

## 2019-05-08 MED ORDER — K PHOS MONO-SOD PHOS DI & MONO 155-852-130 MG PO TABS
250.0000 mg | ORAL_TABLET | Freq: Once | ORAL | Status: DC
  Filled 2019-05-08: qty 1

## 2019-05-08 MED ORDER — PREDNISONE 20 MG PO TABS: 40.0000 mg | ORAL_TABLET | Freq: Every day | ORAL | 0 refills | Status: AC

## 2019-05-08 MED ORDER — PREDNISONE 20 MG PO TABS: 40.0000 mg | ORAL_TABLET | Freq: Every day | ORAL | 0 refills | Status: DC

## 2019-05-08 MED ORDER — ASCORBIC ACID 500 MG PO TABS: 500.0000 mg | ORAL_TABLET | Freq: Every day | ORAL | Status: DC

## 2019-05-08 NOTE — Discharge Summary (Signed)
Physician Discharge Summary  Yesenia Carrillo [id number] DOB: 12-07-1995 DOA: 05/04/2019  PCP: [facility] [place] date: 05/04/2019 Discharge date: 05/08/2019  Admitted From: home Disposition:  home  Recommendations for Outpatient Follow-up:  1. Follow up with PCP in 1-2 weeks 2. F/U w/ OB (Dr. Kenton Kingfisher) on 05/28/19  Home Health: no Equipment/Devices: n/a  Discharge Condition: stable CODE STATUS: full Diet recommendation: carb controlled   Brief/Interim Summary: HPI taken from Dr. Reesa Chew: Yesenia Carrillo is a 23 y.o. female with medical history significant of essential hypertension comes to the hospital with complaints of cough, fevers and chills.  Patient was admitted here back in June with similar complaints and was in ARDS requiring intubation.  Her COVID-19 testing was previously negative.  Her symptoms have progressed over the course the last couple of days where she was not able to speak in full sentences therefore came to the hospital. In the ER she was tested positive for COVID-19.  Her inflammatory markers are pending she was hypoxic requiring 2 L of nasal cannula.  Chest x-ray showed mild basilar infiltrate.  Her beta-hCG pregnancy came back positive.  She also thinks she might be pregnant.  It was decided to admit her. Unable to obtain CTA due to pregnancy.  I spoke with Dr. Kenton Kingfisher from [facility], recommend routine admission and okay with steroid, remdesivir and any other pregnancy safe antibiotics as needed.  Otherwise no other care for now.  Follow-up outpatient.  Too early for ultrasound.   Hospital Course from Dr. Lenise Herald  12/1-12/3/20: Pt was found to be positive for COVID19. Pt did require supplemental oxygen but was able to be weaned off prior to d/c. Pt had a significantly elevated d-dimer but pt was unable to get V/Q scan or CTA chest secondary to the pt being pregnant so pt was started on lovenox. Pt will have to take lovenox at least for 3 months. Pt  was told about the bleeding risk with lovenox and pt verbalized her understanding. Pt has no insurance and the care transition team was able to get the pt lovenox for 30 days but the pt will have to apply for medicaid and apply for assistance through the drug company. All this was told to the pt and the pt verbalized her understanding. Of note, pt has recent hx of pulmonary emboli and took eliquis for 3 months as per pt and was then taken off. Also, pt was found to be pregnant, 1 st trimester, so OB was called. Pt will f/u w/ OB (Dr. Kenton Kingfisher) as an outpatient on 05/28/19. There was nothing to do from an OB standard inpatient as per Dr. Kenton Kingfisher. Finally, pt was found to have acute cystitis and was treated w/ macrobid and complete the course while in the hospital.   Discharge Diagnoses:  Principal Problem:   COVID-19 Active Problems:   Pregnancy   Acute cystitis during pregnancy   Pneumonia due to COVID-19 virus  Acute respiratory distress secondary to COVID19: improving. Continue on remdesivir day #5/5, steroids day #5/10, vitamin C &zinc. Procalcitonin neg. Continue w/ antitussive, inhalers, incentive spirometry. Weaned off of supplemental oxygen  Elevated D-dimer: unable to get CTA or V/Q scan secondary to early pregnancy. Continue on full dose lovenox as NOACs & warfarin contraindicated during pregnancy   Hx of pulmonary embolus: took 3 months of eliquis and was taken off as per pt  Leukocytosis: likely secondary to infection &steroid use. Will continue to monitor   Transaminitis: likely secondary  remdesivir use. Continues to trend down. Will continue to monitor   Acute cystitis: during pregnancy. Complete abx course  Early pregnancy:Quantitative beta hCG 51. Previously discussed w/ OB/GYN, Dr. Kenton Kingfisher, by Dr. Manuella Ghazi and nothing to do inpatient at this point. Ok w/ steroids & remdesivir. Will need to f/u w/ OB as an outpatient  Obesity: BMI 46.6. Would benefit from weight  loss   Discharge Instructions  Discharge Instructions    MyChart COVID-19 home monitoring program   Complete by: May 05, 2019    Is the patient willing to use the [place] for home monitoring?: Yes   Temperature monitoring   Complete by: May 05, 2019    After how many days would you like to receive a notification of this patient's flowsheet entries?: 1   Diet - low sodium heart healthy   Complete by: As directed    Diet Carb Modified   Complete by: As directed    Increase activity slowly   Complete by: As directed    Increase activity slowly   Complete by: As directed      Allergies as of 05/08/2019      Reactions   Ampicillin Rash, Other (See Comments)   Patient and family cannot remember the specifics of the reaction.   Penicillins Hives      Medication List    STOP taking these medications   hydrochlorothiazide 12.5 MG tablet Commonly known as: HYDRODIURIL   THERAFLU COLD & COUGH PO     TAKE these medications   ascorbic acid 500 MG tablet Commonly known as: VITAMIN C Take 1 tablet (500 mg total) by mouth daily.   enoxaparin 120 MG/0.8ML injection Commonly known as: LOVENOX Inject 0.73 mLs (110 mg total) into the skin every 12 (twelve) hours.   methyldopa 250 MG tablet Commonly known as: ALDOMET Take 1 tablet (250 mg total) by mouth daily.   predniSONE 20 MG tablet Commonly known as: Deltasone Take 2 tablets (40 mg total) by mouth daily for 5 days.   zinc sulfate 220 (50 Zn) MG capsule Take 1 capsule (220 mg total) by mouth daily.      Follow-up Information    Gae Dry, MD. Go on 05/28/2019.   Specialty: Obstetrics and Gynecology Why: new patient appointment 9:20am appoinment Contact information: [place] [address] [phone]          Allergies  Allergen Reactions  . Ampicillin Rash and Other (See Comments)    Patient and family cannot remember the specifics of the reaction.  Marland Kitchen Penicillins Hives     Consultations: OB (Dr. Kenton Kingfisher)  Procedures/Studies: Dg Chest 2 View  Result Date: 05/04/2019 CLINICAL DATA:  Cough and fever EXAM: CHEST - 2 VIEW COMPARISON:  March 31, 2019 FINDINGS: There is slight atelectasis in the right base. The lungs elsewhere clear. Heart is upper normal in size with pulmonary vascularity normal. No adenopathy. No bone lesions. IMPRESSION: Slight right base atelectasis. Earliest changes of pneumonia in this area cannot be excluded. Lungs elsewhere clear. Heart upper normal in size. No adenopathy. Electronically Signed   By: Lowella Grip III M.D.   On: 05/04/2019 12:11     Subjective:   Discharge Exam: Vitals:   05/08/19 0909 05/08/19 0917  BP:    Pulse:    Resp:    Temp:    SpO2: 97% 92%   Vitals:   05/07/19 2142 05/08/19 0608 05/08/19 0909 05/08/19 0917  BP: (!) 125/92 125/74  Pulse: 91 81    Resp: 18     Temp: (!) 97.4 F (36.3 C) 98.1 F (36.7 C)    TempSrc: Oral Oral    SpO2: 92% 99% 97% 92%  Weight:      Height:        General: Pt is alert, awake, not in acute distress Cardiovascular: S1/S2 normal, no rubs, no gallops Respiratory: diminished breath sounds b/l otherwise clear, no wheezing Abdominal: Soft, NT, obese, bowel sounds + Extremities:  no cyanosis    The results of significant diagnostics from this hospitalization (including imaging, microbiology, ancillary and laboratory) are listed below for reference.     Microbiology: Recent Results (from the past 240 hour(s))  Blood Culture (routine x 2)     Status: None (Preliminary result)   Collection Time: 05/04/19  1:23 PM   Specimen: BLOOD  Result Value Ref Range Status   Specimen Description BLOOD RIGHT ANTECUBITAL  Final   Special Requests   Final    BOTTLES DRAWN AEROBIC AND ANAEROBIC Blood Culture results may not be optimal due to an excessive volume of blood received in culture bottles   Culture   Final    NO GROWTH 4 DAYS Performed at [facility], [address] [place], [place] [address]    Report Status PENDING  Incomplete  Blood Culture (routine x 2)     Status: None (Preliminary result)   Collection Time: 05/04/19  1:36 PM   Specimen: BLOOD  Result Value Ref Range Status   Specimen Description BLOOD BLOOD LEFT FOREARM  Final   Special Requests   Final    BOTTLES DRAWN AEROBIC AND ANAEROBIC Blood Culture results may not be optimal due to an excessive volume of blood received in culture bottles   Culture   Final    NO GROWTH 4 DAYS Performed at [facility], [address] [place], [place] [address]    Report Status PENDING  Incomplete  Urine culture     Status: Abnormal   Collection Time: 05/04/19  3:11 PM   Specimen: In/Out Cath Urine  Result Value Ref Range Status   Specimen Description   Final    IN/OUT CATH URINE Performed at [facility], [address] [place], [place] [address]    Special Requests   Final    NONE Performed at [facility], [address] [place], [place] [address]    Culture MULTIPLE SPECIES PRESENT, SUGGEST RECOLLECTION (A)  Final   Report Status 05/05/2019 FINAL  Final     Labs: BNP (last 3 results) Recent Labs    05/20/18 1229 11/05/18 1219 05/05/19 0722  BNP 18.0 74.0 [id number]   Basic Metabolic Panel: Recent Labs  Lab 05/04/19 1120 05/05/19 0500 05/06/19 0402 05/07/19 0523 05/08/19 0455  NA 137 138 140 140 139  K 3.7 3.9 4.0 4.3 4.1  CL 102 106 107 107 105  CO2 22 22 23 25 24   GLUCOSE 101* 129* 139* 118* 114*  BUN 6 8 11 14 14   CREATININE 0.62 0.45 0.49 0.40* 0.41*  CALCIUM 9.0 8.7* 9.0 8.8* 8.7*  MG  --  2.4 2.6* 2.5* 2.6*  PHOS  --  3.0 2.9 3.6 3.5   Liver Function Tests: Recent Labs  Lab 05/04/19 1120 05/05/19 0500 05/06/19 0402 05/07/19 0523 05/08/19 0455  AST 97* 93* 72* 54* 47*  ALT 77* 75* 74* 71* 65*  ALKPHOS 46 45 43 39 40  BILITOT 0.7 0.7 0.5 0.4 0.4  PROT 8.1 8.1 8.1 7.6 7.5  ALBUMIN 3.8 3.5 3.5 3.4* 3.3*   No  results for input(s): LIPASE, AMYLASE in the last 168 hours. No results for input(s): AMMONIA in the last 168 hours. CBC: Recent Labs  Lab 05/04/19 1120 05/05/19 0500 05/06/19 0402 05/07/19 0523 05/08/19 0455  WBC 12.2* 10.0 14.0* 17.2* 23.4*  NEUTROABS 10.0* 8.2* 10.9* 13.5* 17.7*  HGB 12.8 12.2 11.9* 13.0 13.3  HCT 37.4 35.8* 36.3 38.0 39.5  MCV 74.5* 75.1* 77.4* 75.1* 74.7*  PLT 355 359 397 470* 511*   Cardiac Enzymes: No results for input(s): CKTOTAL, CKMB, CKMBINDEX, TROPONINI in the last 168 hours. BNP: Invalid input(s): POCBNP CBG: Recent Labs  Lab 05/04/19 1849 05/04/19 2224 05/05/19 0908  GLUCAP 92 136* 119*   D-Dimer No results for input(s): DDIMER in the last 72 hours. Hgb A1c No results for input(s): HGBA1C in the last 72 hours. Lipid Profile No results for input(s): CHOL, HDL, LDLCALC, TRIG, CHOLHDL, LDLDIRECT in the last 72 hours. Thyroid function studies No results for input(s): TSH, T4TOTAL, T3FREE, THYROIDAB in the last 72 hours.  Invalid input(s): FREET3 Anemia work up Recent Labs    05/07/19 0523 05/08/19 0455  FERRITIN 149 116  TIBC  --  225*  IRON  --  59   Urinalysis    Component Value Date/Time   COLORURINE YELLOW (A) 05/04/2019 1511   APPEARANCEUR HAZY (A) 05/04/2019 1511   APPEARANCEUR Clear 06/06/2013 2001   LABSPEC 1.015 05/04/2019 1511   LABSPEC 1.013 06/06/2013 2001   PHURINE 5.0 05/04/2019 1511   GLUCOSEU NEGATIVE 05/04/2019 1511   GLUCOSEU Negative 06/06/2013 2001   HGBUR SMALL (A) 05/04/2019 1511   BILIRUBINUR NEGATIVE 05/04/2019 1511   BILIRUBINUR Negative 06/06/2013 2001   KETONESUR NEGATIVE 05/04/2019 1511   PROTEINUR NEGATIVE 05/04/2019 1511   NITRITE POSITIVE (A) 05/04/2019 1511   LEUKOCYTESUR MODERATE (A) 05/04/2019 1511   LEUKOCYTESUR Negative 06/06/2013 2001   Sepsis Labs Invalid input(s): PROCALCITONIN,  WBC,  LACTICIDVEN Microbiology Recent Results (from the past 240 hour(s))  Blood Culture (routine x 2)      Status: None (Preliminary result)   Collection Time: 05/04/19  1:23 PM   Specimen: BLOOD  Result Value Ref Range Status   Specimen Description BLOOD RIGHT ANTECUBITAL  Final   Special Requests   Final    BOTTLES DRAWN AEROBIC AND ANAEROBIC Blood Culture results may not be optimal due to an excessive volume of blood received in culture bottles   Culture   Final    NO GROWTH 4 DAYS Performed at [facility], [address] [place], [place] [address]    Report Status PENDING  Incomplete  Blood Culture (routine x 2)     Status: None (Preliminary result)   Collection Time: 05/04/19  1:36 PM   Specimen: BLOOD  Result Value Ref Range Status   Specimen Description BLOOD BLOOD LEFT FOREARM  Final   Special Requests   Final    BOTTLES DRAWN AEROBIC AND ANAEROBIC Blood Culture results may not be optimal due to an excessive volume of blood received in culture bottles   Culture   Final    NO GROWTH 4 DAYS Performed at [facility], [address], [place], [place] [address]    Report Status PENDING  Incomplete  Urine culture     Status: Abnormal   Collection Time: 05/04/19  3:11 PM   Specimen: In/Out Cath Urine  Result Value Ref Range Status   Specimen Description  Final    IN/OUT CATH URINE Performed at [facility], [address], [place], [place] [address]    Special Requests   Final    NONE Performed at [facility], [address] [place], [place] [address]    Culture MULTIPLE SPECIES PRESENT, SUGGEST RECOLLECTION (A)  Final   Report Status 05/05/2019 FINAL  Final     Time coordinating discharge: Over 30 minutes  SIGNED:   Wyvonnia Dusky, MD  [facility] 05/08/2019, 3:54 PM Pager   If 7PM-7AM, please contact night-coverage [email or web address] Password [email or web address]

## 2019-05-08 NOTE — TOC Initial Note (Signed)
Transition of Care [facility]) - Initial/Assessment Note    Patient Details  Name: Yesenia Carrillo MRN: [id number] Date of Birth: October 01, 1995  Transition of [facility]) CM/SW Contact:    Beverly Sessions, RN Phone Number: 05/08/2019, 5:01 PM  Clinical Narrative:                 Patient admitted with covid.   Due to regulations assessment was completed via phone  Patient states she lives at home with her parents.  Her grandmother will be picking her up at discharge, and picking up her medications.    PCP [place] - normally uses [facility]  Patient to discharge today on lovenox.  5 day supply obtained from [facility], delivered to patient by [facility] RN  RNCM spoke with Crystal at [facility] on [address]  Patient was enrolled in [facility] with price override.  RNCM confirmed with Crystal that [place] has been approved.  Copay will be $3,  Other discharge medications were also sent to [facility] and will be covered under [facility]  Patient is pregnant.  New patient appointment with Dr Kenton Kingfisher OBGYN made for 12/23.    Patient states that she has already applied for Medicaid.  Patient is aware that assistance from the hospital for Lovenox is only valid for 1 month.  She is aware that she will need to apply for financial assistance through the Lovenox web site, that way she has additional resources if her medicaid is not approved in time  MD updated with above information  Patient is established with PCP at [facility]  Expected Discharge Plan: Home/Self Care     Patient Goals and CMS Choice        Expected Discharge Plan and Services Expected Discharge Plan: Home/Self Care   Discharge Planning Services: CM Consult, Medication Assistance   Living arrangements for the past 2 months: Single Family Home Expected Discharge Date: 05/08/19                                    Prior Living Arrangements/Services Living arrangements for the past 2 months: Single Family Home Lives  with:: Parents Patient language and need for interpreter reviewed:: Yes        Need for Family Participation in Patient Care: No (Comment) Care giver support system in place?: Yes (comment)   Criminal Activity/Legal Involvement Pertinent to Current Situation/Hospitalization: No - Comment as needed  Activities of Daily Living Home Assistive Devices/Equipment: None ADL Screening (condition at time of admission) Patient's cognitive ability adequate to safely complete daily activities?: Yes Is the patient deaf or have difficulty hearing?: No Does the patient have difficulty seeing, even when wearing glasses/contacts?: No Does the patient have difficulty concentrating, remembering, or making decisions?: No Patient able to express need for assistance with ADLs?: Yes Does the patient have difficulty dressing or bathing?: No Independently performs ADLs?: Yes (appropriate for developmental age) Does the patient have difficulty walking or climbing stairs?: No Weakness of Legs: None Weakness of Arms/Hands: None  Permission Sought/Granted                  Emotional Assessment       Orientation: : Oriented to Self, Oriented to Situation, Oriented to  Time, Oriented to Place   Psych Involvement: No (comment)  Admission diagnosis:  Acute respiratory failure with hypoxia ([place]) [J96.01] Acute respiratory disease due to COVID-19 virus [U07.1, J06.9] COVID-19 [  U07.1] Pneumonia due to COVID-19 virus [U07.1, J12.89] Patient Active Problem List   Diagnosis Date Noted  . Pneumonia due to COVID-19 virus 05/06/2019  . COVID-19 05/04/2019  . Pregnancy 05/04/2019  . Acute cystitis during pregnancy 05/04/2019  . Sepsis ([place]) 11/05/2018  . Acute respiratory failure with hypoxia ([place])   . CAP (community acquired pneumonia) 04/30/2018   PCP:  [place]:   [place], [place] AT [facility] [address] [place]  [address] Phone: [phone] Fax: [phone]  [facility] [id number] [place], [place] - [id number] AT [place] [address] Phone: [phone] Fax: [phone]  [place] [address] [place] - [place] [address] Phone: [phone] Fax: [phone]  [place], [place] [address] Phone: [phone] Fax: [phone]     Social Determinants of Health (SDOH) Interventions    Readmission Risk Interventions No flowsheet data found.

## 2019-05-08 NOTE — Progress Notes (Addendum)
Discharge order received. Patient mental status is at baseline. Vital signs stable . No signs of acute distress. Discharge instructions given. Pt was also educated on lovenox shot and to monitor signs bleeding from anywhere.  Patient verbalized understanding. MD was notified of red vaginal discharge. Lab drawn per MD order. Pt was discharged home and instructed to call OB Doc when bloody vaginal discharge increases or any other concern with pregnancy.

## 2019-05-08 NOTE — Discharge Instructions (Signed)
You will need to take lovenox for at least 3 months;  F/U w/ PCP in 2 weeks;  F/U w/ Dr. Kenton Kingfisher (OB) on 05/28/19;  You will need to quarantine at home for at least 10 more days

## 2019-05-09 ENCOUNTER — Other Ambulatory Visit: Payer: Self-pay

## 2019-05-09 ENCOUNTER — Telehealth: Payer: Self-pay

## 2019-05-09 ENCOUNTER — Emergency Department: Payer: Medicaid Other

## 2019-05-09 ENCOUNTER — Inpatient Hospital Stay
Admission: EM | Admit: 2019-05-09 | Discharge: 2019-05-10 | DRG: 779 | Disposition: A | Discharge status: Home / Self Care | Payer: Medicaid Other | Attending: Internal Medicine | Admitting: Internal Medicine

## 2019-05-09 ENCOUNTER — Ambulatory Visit: Payer: Medicaid Other | Admitting: Obstetrics and Gynecology

## 2019-05-09 DIAGNOSIS — J1289 Other viral pneumonia: Secondary | ICD-10-CM | POA: Diagnosis present

## 2019-05-09 DIAGNOSIS — J1282 Pneumonia due to coronavirus disease 2019: Secondary | ICD-10-CM | POA: Diagnosis present

## 2019-05-09 DIAGNOSIS — J9601 Acute respiratory failure with hypoxia: Secondary | ICD-10-CM | POA: Diagnosis present

## 2019-05-09 DIAGNOSIS — I10 Essential (primary) hypertension: Secondary | ICD-10-CM

## 2019-05-09 DIAGNOSIS — O039 Complete or unspecified spontaneous abortion without complication: Secondary | ICD-10-CM | POA: Diagnosis not present

## 2019-05-09 DIAGNOSIS — N939 Abnormal uterine and vaginal bleeding, unspecified: Secondary | ICD-10-CM

## 2019-05-09 DIAGNOSIS — Z3A01 Less than 8 weeks gestation of pregnancy: Secondary | ICD-10-CM

## 2019-05-09 DIAGNOSIS — R0902 Hypoxemia: Secondary | ICD-10-CM

## 2019-05-09 DIAGNOSIS — O021 Missed abortion: Principal | ICD-10-CM | POA: Diagnosis present

## 2019-05-09 DIAGNOSIS — U071 COVID-19: Secondary | ICD-10-CM

## 2019-05-09 DIAGNOSIS — F17290 Nicotine dependence, other tobacco product, uncomplicated: Secondary | ICD-10-CM | POA: Diagnosis present

## 2019-05-09 DIAGNOSIS — Z86711 Personal history of pulmonary embolism: Secondary | ICD-10-CM

## 2019-05-09 LAB — COMPREHENSIVE METABOLIC PANEL
ALT: 70 U/L — ABNORMAL HIGH (ref 0–44)
AST: 71 U/L — ABNORMAL HIGH (ref 15–41)
Albumin: 3.4 g/dL — ABNORMAL LOW (ref 3.5–5.0)
Alkaline Phosphatase: 42 U/L (ref 38–126)
Anion gap: 11 (ref 5–15)
BUN: 17 mg/dL (ref 6–20)
CO2: 24 mmol/L (ref 22–32)
Calcium: 8.3 mg/dL — ABNORMAL LOW (ref 8.9–10.3)
Chloride: 103 mmol/L (ref 98–111)
Creatinine, Ser: 0.74 mg/dL (ref 0.44–1.00)
GFR calc Af Amer: >60 mL/min (ref >60)
GFR calc non Af Amer: >60 mL/min (ref >60)
Glucose, Bld: 93 mg/dL (ref 70–99)
Potassium: 3.3 mmol/L — ABNORMAL LOW (ref 3.5–5.1)
Sodium: 138 mmol/L (ref 135–145)
Total Bilirubin: 0.6 mg/dL (ref 0.3–1.2)
Total Protein: 7.2 g/dL (ref 6.5–8.1)

## 2019-05-09 LAB — CBC
HCT: 42.8 % (ref 36.0–46.0)
Hemoglobin: 14.7 g/dL (ref 12.0–15.0)
MCH: 25.3 pg — ABNORMAL LOW (ref 26.0–34.0)
MCHC: 34.3 g/dL (ref 30.0–36.0)
MCV: 73.8 fL — ABNORMAL LOW (ref 80.0–100.0)
Platelets: 542 10*3/uL — ABNORMAL HIGH (ref 150–400)
RBC: 5.8 MIL/uL — ABNORMAL HIGH (ref 3.87–5.11)
RDW: 17.6 % — ABNORMAL HIGH (ref 11.5–15.5)
WBC: 21.2 10*3/uL — ABNORMAL HIGH (ref 4.0–10.5)
nRBC: 0 % (ref 0.0–0.2)

## 2019-05-09 LAB — CULTURE, BLOOD (ROUTINE X 2)
Culture: NO GROWTH
Culture: NO GROWTH

## 2019-05-09 LAB — HCG, QUANTITATIVE, PREGNANCY: hCG, Beta Chain, Quant, S: 10 m[IU]/mL — ABNORMAL HIGH (ref <5)

## 2019-05-09 MED ORDER — SODIUM CHLORIDE 0.9 % IV BOLUS
1000.0000 mL | Freq: Once | INTRAVENOUS | Status: AC
  Administered 2019-05-09: 18:00:00 1000 mL via INTRAVENOUS

## 2019-05-09 MED ORDER — ACETAMINOPHEN 500 MG PO TABS
1000.0000 mg | ORAL_TABLET | Freq: Once | ORAL | Status: AC
  Administered 2019-05-09: 1000 mg via ORAL
  Filled 2019-05-09: qty 2

## 2019-05-09 NOTE — ED Notes (Signed)
Pt given cranberry juice.

## 2019-05-09 NOTE — ED Provider Notes (Signed)
[facility] Emergency Department Provider Note  Time seen: 6:00 PM  I have reviewed the triage vital signs and the nursing notes.   HISTORY  Chief Complaint Vaginal Bleeding and Weakness   HPI Yesenia Carrillo is a 23 y.o. female with a past medical history of hypertension, recent Covid infection, presents to the emergency department  with vaginal bleeding.  According to the patient she is approximately 4 to [redacted] weeks pregnant, was admitted recently for Covid discharged yesterday.  Began having some vaginal spotting yesterday morning today the vaginal bleeding has increased somewhat along with some lower abdominal cramping.  Patient states she continues to feel weak today currently febrile to 102.8 but denies any Tylenol use at home, states she did not know she had a fever.  Continues to have cough.  Denies any significant shortness of breath.  Past Medical History:  Diagnosis Date  . Hypertension     Patient Active Problem List   Diagnosis Date Noted  . Pneumonia due to COVID-19 virus 05/06/2019  . COVID-19 05/04/2019  . Pregnancy 05/04/2019  . Acute cystitis during pregnancy 05/04/2019  . Sepsis ([place]) 11/05/2018  . Acute respiratory failure with hypoxia ([place])   . CAP (community acquired pneumonia) 04/30/2018    Past Surgical History:  Procedure Laterality Date  . TONSILLECTOMY    . WISDOM TOOTH EXTRACTION      Prior to Admission medications   Medication Sig Start Date End Date Taking? Authorizing Provider  ascorbic acid (VITAMIN C) 500 MG tablet Take 1 tablet (500 mg total) by mouth daily. 05/08/19   Wyvonnia Dusky, MD  enoxaparin (LOVENOX) 120 MG/0.8ML injection Inject 0.73 mLs (110 mg total) into the skin every 12 (twelve) hours. 05/08/19 06/07/19  Wyvonnia Dusky, MD  enoxaparin (LOVENOX) 120 MG/0.8ML injection Inject 0.73 mLs (110 mg total) into the skin 2 (two) times daily for 5 days. 05/08/19 05/13/19  Wyvonnia Dusky, MD  methyldopa (ALDOMET)  250 MG tablet Take 1 tablet (250 mg total) by mouth daily. 05/08/19 06/07/19  Wyvonnia Dusky, MD  predniSONE (DELTASONE) 20 MG tablet Take 2 tablets (40 mg total) by mouth daily for 5 days. 05/08/19 05/13/19  Wyvonnia Dusky, MD  zinc sulfate 220 (50 Zn) MG capsule Take 1 capsule (220 mg total) by mouth daily. 05/08/19   Wyvonnia Dusky, MD    Allergies  Allergen Reactions  . Ampicillin Rash and Other (See Comments)    Patient and family cannot remember the specifics of the reaction.  [place] Kitchen Penicillins Hives    No family history on file.  Social History Social History   Tobacco Use  . Smoking status: Current Every Day Smoker    Types: Cigars  . Smokeless tobacco: Never Used  Substance Use Topics  . Alcohol use: Yes    Comment: monthly  . Drug use: No    Review of Systems Constitutional: Found to be febrile in the emergency department Cardiovascular: Negative for chest pain. Respiratory: Negative for shortness of breath.  Positive for cough. Gastrointestinal: Mild lower abdominal cramping. Genitourinary: Positive for vaginal bleeding. Musculoskeletal: Negative for musculoskeletal complaints Neurological: Negative for headache All other ROS negative  ____________________________________________   PHYSICAL EXAM:  VITAL SIGNS: ED Triage Vitals  Enc Vitals Group     BP 05/09/19 1727 (!) 103/58     Pulse Rate 05/09/19 1727 (!) 138     Resp 05/09/19 1727 (!) 22     Temp 05/09/19 1727 (!) 102.8 F (39.3  C)     Temp src --      SpO2 05/09/19 1727 91 %     Weight 05/09/19 1727 255 lb (115.7 kg)     Height 05/09/19 1727 5\' 2"  (1.575 m)     Head Circumference --      Peak Flow --      Pain Score 05/09/19 1733 6     Pain Loc --      Pain Edu? --      Excl. in [place]? --    Constitutional: Alert and oriented. Well appearing and in no distress. Eyes: Normal exam ENT      Head: Normocephalic and atraumatic.      Mouth/Throat: Mucous membranes are  moist. Cardiovascular: Normal rate, regular rhythm. Respiratory: Normal respiratory effort without tachypnea nor retractions. Breath sounds are clear Gastrointestinal: Soft and nontender. No distention. Musculoskeletal: Nontender with normal range of motion in all extremities.  Neurologic:  Normal speech and language. No gross focal neurologic deficits Skin:  Skin is warm, dry and intact.  Psychiatric: Mood and affect are normal.  ____________________________________________   INITIAL IMPRESSION / ASSESSMENT AND PLAN / ED COURSE  Pertinent labs & imaging results that were available during my care of the patient were reviewed by me and considered in my medical decision making (see chart for details).   Patient presents to the emergency department for vaginal bleeding approximately [redacted] weeks pregnant, complicated by Covid positive status.  Patient currently febrile and tachycardic.  We will dose Tylenol, IV hydrate check basic labs and continue to closely monitor.  Currently satting between 93-98% on room air.  We will attempt to obtain an ultrasound as well as a quantitative beta-hCG.  Overall patient appears well, with no acute distress.  Patient's beta-hCG unfortunately is reading at 10 down from 50 along with her vaginal bleeding and mild cramping this is unfortunately consistent with likely miscarriage.  Do not believe an ultrasound would be of much utility at this point.  Patient however has now desatted she remains tachycardic remains febrile desats to around 86 to 88% on room air.  Satting in the mid 90s on 2 L.  We will repeat a chest x-ray.  White blood cell count is 21,000 although down from 23,000 yesterday likely due to Decadron use.  Patient consistently hypoxic to 86% on room air satting well on 2 L currently 96%.  Given the patient's hypoxia with what appears to be worsening multifocal or bilateral pneumonia on her chest x-ray and Covid positive status we will admit to the hospitalist  service for further treatment.  Patient agreeable.  Yesenia Carrillo was evaluated in Emergency Department on 05/09/2019 for the symptoms described in the history of present illness. She was evaluated in the context of the global COVID-19 pandemic, which necessitated consideration that the patient might be at risk for infection with the SARS-CoV-2 virus that causes COVID-19. Institutional protocols and algorithms that pertain to the evaluation of patients at risk for COVID-19 are in a state of rapid change based on information released by regulatory bodies including the CDC and federal and state organizations. These policies and algorithms were followed during the patient's care in the ED.  ____________________________________________   FINAL CLINICAL IMPRESSION(S) / ED DIAGNOSES  COVID-19 Miscarriage Hypoxia   Harvest Dark, MD 05/09/19 2058

## 2019-05-09 NOTE — ED Notes (Signed)
MD at bedside to update patient

## 2019-05-09 NOTE — ED Triage Notes (Addendum)
Pt comes via POV from home with c/o vaginal bleeding and weakness.  Pt just d/c yesterday after being admitted for COVID + and since has had heavy bleeding. Pt is about 4 weeks or so along .  Pt states weakness and dizziness.  Pt is febrile, tachy and O2-91% RA

## 2019-05-09 NOTE — ED Notes (Signed)
Pt SpO2 86%, pt placed on 2L [place]

## 2019-05-09 NOTE — Telephone Encounter (Signed)
Patient inquiring about lab results that were drawn before she left the hospital yesterday. [id number]

## 2019-05-09 NOTE — Telephone Encounter (Signed)
Spoke w/patient who states she was advised to contact us if she was still bleeding. She reports that she is still bleeding, but she has an apt at 3pm with [place] today. Advised some of her results have returned, however, they have not been reviewed by the provider yet. Pt will discuss at today's visit.

## 2019-05-09 NOTE — Telephone Encounter (Signed)
Spoke w/patient. Front desk has notified her of apt cancelled for today and r/s for 05/28/2019. Patient notified of AMS repsonse regarding labs. She was contacting us regarding the Beta as ER advised her to f/u w/us if she was still bleeding. Reports she is still bleeding/spotting pink. Advised to report to ER if completely soaking a pad and having to change more than 1x qhr or if cramping pain isn't controlled by tylenol. She would like her Beta results when received.

## 2019-05-09 NOTE — ED Notes (Signed)
Pt resting, reporting only bleeding when she gets up to use the restroom. Denies pain

## 2019-05-09 NOTE — ED Notes (Signed)
X-ray at bedside

## 2019-05-09 NOTE — ED Notes (Signed)
Pt placed on 2L oxygen via [place] due to Oxygen saturation of 90%. Pt reporting the oxygen was uncomfortable. RN turned oxygen off for the time being because pts oxygen increased to 95% on RA.

## 2019-05-09 NOTE — Telephone Encounter (Signed)
Has not resulted yet, it won't come to my inbox either so not sure if it will result later this weekend but I won't be checking over the weekend.

## 2019-05-09 NOTE — Telephone Encounter (Signed)
Does labs are for her covid admission and that should be discussed with her PCP I only have the HCG value 51 from 11/29 that I see.  I can't comment to the meaning of the other labs and she does not need to follow up with a current active case of COVID

## 2019-05-09 NOTE — ED Notes (Signed)
RN Larene Beach at bedside, MD [facility] aware

## 2019-05-10 DIAGNOSIS — F17290 Nicotine dependence, other tobacco product, uncomplicated: Secondary | ICD-10-CM | POA: Diagnosis present

## 2019-05-10 DIAGNOSIS — J1289 Other viral pneumonia: Secondary | ICD-10-CM | POA: Diagnosis present

## 2019-05-10 DIAGNOSIS — J9601 Acute respiratory failure with hypoxia: Secondary | ICD-10-CM

## 2019-05-10 DIAGNOSIS — U071 COVID-19: Secondary | ICD-10-CM | POA: Diagnosis present

## 2019-05-10 DIAGNOSIS — O021 Missed abortion: Secondary | ICD-10-CM | POA: Diagnosis not present

## 2019-05-10 DIAGNOSIS — O039 Complete or unspecified spontaneous abortion without complication: Secondary | ICD-10-CM

## 2019-05-10 DIAGNOSIS — O209 Hemorrhage in early pregnancy, unspecified: Secondary | ICD-10-CM | POA: Diagnosis not present

## 2019-05-10 DIAGNOSIS — I10 Essential (primary) hypertension: Secondary | ICD-10-CM

## 2019-05-10 DIAGNOSIS — Z3A01 Less than 8 weeks gestation of pregnancy: Secondary | ICD-10-CM | POA: Diagnosis not present

## 2019-05-10 DIAGNOSIS — Z86711 Personal history of pulmonary embolism: Secondary | ICD-10-CM | POA: Diagnosis not present

## 2019-05-10 LAB — FERRITIN: Ferritin: 115 ng/mL (ref 11–307)

## 2019-05-10 LAB — PROCALCITONIN: Procalcitonin: <0.1 ng/mL

## 2019-05-10 LAB — LACTATE DEHYDROGENASE: LDH: 300 U/L — ABNORMAL HIGH (ref 98–192)

## 2019-05-10 LAB — HCG, QUANTITATIVE, PREGNANCY: hCG, Beta Chain, Quant, S: 6 m[IU]/mL — ABNORMAL HIGH (ref <5)

## 2019-05-10 LAB — BETA HCG QUANT (REF LAB): hCG Quant: 31 m[IU]/mL

## 2019-05-10 LAB — FIBRIN DERIVATIVES D-DIMER (ARMC ONLY): Fibrin derivatives D-dimer (ARMC): 593.2 ng/mL (FEU) — ABNORMAL HIGH (ref 0.00–499.00)

## 2019-05-10 MED ORDER — METHYLDOPA 250 MG PO TABS: 250.0000 mg | ORAL_TABLET | Freq: Every day | ORAL | Status: DC

## 2019-05-10 MED ORDER — DEXAMETHASONE SODIUM PHOSPHATE 10 MG/ML IJ SOLN
6.0000 mg | INTRAMUSCULAR | Status: DC
  Administered 2019-05-10: 06:00:00 6 mg via INTRAVENOUS
  Filled 2019-05-10: qty 1

## 2019-05-10 MED ORDER — ENOXAPARIN SODIUM 40 MG/0.4ML ~~LOC~~ SOLN: 40.0000 mg | Freq: Two times a day (BID) | SUBCUTANEOUS | Status: DC

## 2019-05-10 MED ORDER — ENOXAPARIN SODIUM 60 MG/0.6ML ~~LOC~~ SOLN
60.0000 mg | Freq: Two times a day (BID) | SUBCUTANEOUS | Status: DC
  Filled 2019-05-10: qty 0.6

## 2019-05-10 MED ORDER — ENOXAPARIN SODIUM 40 MG/0.4ML ~~LOC~~ SOLN
40.0000 mg | Freq: Two times a day (BID) | SUBCUTANEOUS | Status: DC
  Administered 2019-05-10: 08:00:00 40 mg via SUBCUTANEOUS
  Filled 2019-05-10: qty 0.4

## 2019-05-10 NOTE — Telephone Encounter (Signed)
It was 10, she is admitted in the hospital again for covid and possible PE.

## 2019-05-10 NOTE — ED Notes (Signed)
Pt assessed for knowledge regarding lovenox injection. Pt states she is unsure if she is injecting the correct dose because she experienced dizziness after injection. Script reviewed with pt and return demonstration given. Pt instructed to compare dose on prefilled vial to prescribed dose on script, and to discuss with pharmacist if unsure. Pt verbalizes understanding.

## 2019-05-10 NOTE — ED Notes (Signed)
Pt provided breakfast tray, sitting on side of bed eating. Denies any other needs.

## 2019-05-10 NOTE — ED Notes (Signed)
Pt ambulated on room air with continuous O2 saturation, pt maintains saturations above 94% on room air. Denies SOB.

## 2019-05-10 NOTE — Discharge Summary (Signed)
Physician Discharge Summary  JANEIL BATTEN [id number] DOB: [birth year] DOA: 05/09/2019  PCP: [facility] date: 05/09/2019 Discharge date: 05/10/2019  Admitted From: home Disposition:  home  Recommendations for Outpatient Follow-up:  1. Follow up with PCP in 2 weeks   Home Health:no Equipment/Devices: n/a  Discharge Condition: stable  CODE STATUS: full  Diet recommendation: Heart Healthy / Carb Modified   Brief/Interim Summary: HPI taken from Dr. Flossie Buffy: HPI: ROSIE REIF is a 23 y.o. female G1P0 with medical history significant of pulmonary embolism,  hypertension, early pregnancy and recent COVID pneumonia who presents with concerns of vaginal bleeding.  Patient was hospitalized from 11/29 -12/3 for acute hypoxic respiratory failure secondary to Covid pneumonia requiring up to 2 L of oxygen.  She finished a course of remdesivir and received 5 out of 10 days of steroids.  She had elevated D-dimer at that time but was unable to get CTA or VQ scan due to early pregnancy with quantitative beta-hCG of 51.  She was discharged home on full dose Lovenox. Patient's main concern today is that she started to noticed vaginal spotting today with clots.  Denies any heavy bleeding states that it was minimal and that she did not really fill up any menstrual pads.  Also noted lower abdominal cramping yesterday that has resolved today.  She denies any shortness of breath and is unsure why she is on oxygen at this time.  Denies any chest pain.  No nausea, vomiting.  Patient reports that her pregnancy was "somewhat planned."  ED Course: She was febrile up to 102.8, tachycardiac up to 130 and had desaturation down to 86% on room air and was placed on 2L. CBC shows leukocytosis of  21.2 and no anemia. CMP had potassium of 3.3. beta HCG of 10 down from 51. CXR shows finding most consistent with multifocal pneumonia.  Hospital course from Dr. Lenise Herald:  Pt was able to be weaned  off of supplemental oxygen prior to d/c. Pt was saturating in mids 90s on RA at rest as well as with exertion. Pt was able to ambulate independently. Vitals were stable at the time of d/c. Furthermore, pt was given further education on how to give herself Lovenox as well as ADRs of lovenox. Of note, B-HCG on 05/10/19 was 6 which significantly down from 51 on 05/04/19.    Discharge Diagnoses:  Principal Problem:   Acute respiratory failure with hypoxia ([place]) Active Problems:   COVID-19   Pneumonia due to COVID-19 virus   Spontaneous pregnancy loss   Essential hypertension  Sepsis: Resolved.  Acute hypoxia respiratory failure: secondary COVID19 pneumonia. Resolved.  COVID19 pneumonia: Continue on steroids.  Patient completed course of remdesivir so will not restart.  Airborne and contact precautions.  Weaned off of supplemental oxygen   Early pregnancy loss: Vaginal spotting and now has decreasing beta hCG, currently at 6. Pt has a f/u appt w/ Dr. Kenton Kingfisher (OB) on 05/28/19  Hypertension: continue 30 day supply of methyldopa as pt was previously pregnant and pt can restart home dose of HCTZ after then   Discharge Instructions  Discharge Instructions    Diet - low sodium heart healthy   Complete by: As directed    Discharge instructions   Complete by: As directed    F/U w/ PCP in 2 weeks; Continue to quarantine for a total of 14 days; you can finish up the 30 day script of methyldopa and then start back your home dose  of hydrochlorothiazide after the 30 days are up   Increase activity slowly   Complete by: As directed      Allergies as of 05/10/2019      Reactions   Ampicillin Rash, Other (See Comments)   Patient and family cannot remember the specifics of the reaction.   Penicillins Hives      Medication List    TAKE these medications   ascorbic acid 500 MG tablet Commonly known as: VITAMIN C Take 1 tablet (500 mg total) by mouth daily.   enoxaparin 120 MG/0.8ML  injection Commonly known as: LOVENOX Inject 0.73 mLs (110 mg total) into the skin 2 (two) times daily for 5 days.   methyldopa 250 MG tablet Commonly known as: ALDOMET Take 1 tablet (250 mg total) by mouth daily.   predniSONE 20 MG tablet Commonly known as: Deltasone Take 2 tablets (40 mg total) by mouth daily for 5 days.   zinc sulfate 220 (50 Zn) MG capsule Take 1 capsule (220 mg total) by mouth daily.       Allergies  Allergen Reactions  . Ampicillin Rash and Other (See Comments)    Patient and family cannot remember the specifics of the reaction.  Marland Kitchen Penicillins Hives    Consultations:  none   Procedures/Studies: Dg Chest 2 View  Result Date: 05/04/2019 CLINICAL DATA:  Cough and fever EXAM: CHEST - 2 VIEW COMPARISON:  March 31, 2019 FINDINGS: There is slight atelectasis in the right base. The lungs elsewhere clear. Heart is upper normal in size with pulmonary vascularity normal. No adenopathy. No bone lesions. IMPRESSION: Slight right base atelectasis. Earliest changes of pneumonia in this area cannot be excluded. Lungs elsewhere clear. Heart upper normal in size. No adenopathy. Electronically Signed   By: Lowella Grip III M.D.   On: 05/04/2019 12:11   Dg Chest Portable 1 View  Result Date: 05/09/2019 CLINICAL DATA:  23 year old female with shortness of breath. Positive COVID-19. EXAM: PORTABLE CHEST 1 VIEW COMPARISON:  Chest radiograph dated 05/04/2019 and CT dated 11/05/2018. FINDINGS: Bilateral confluent opacities primarily involving the right lower lobe most consistent with multifocal pneumonia, likely viral or atypical infection such as COVID-19. Clinical correlation is recommended. There is no pleural effusion or pneumothorax. Stable cardiac silhouette. No acute osseous pathology. IMPRESSION: Findings most consistent with multifocal pneumonia. Electronically Signed   By: Anner Crete M.D.   On: 05/09/2019 20:32   (Echo, Carotid, EGD, Colonoscopy, ERCP)     Subjective: Pt c/o fatigue  Discharge Exam: Vitals:   05/10/19 1100 05/10/19 1237  BP:  121/81  Pulse: (!) 106 (!) 106  Resp:  18  Temp:  98.7 F (37.1 C)  SpO2: 95% 94%   Vitals:   05/10/19 1000 05/10/19 1030 05/10/19 1100 05/10/19 1237  BP:    121/81  Pulse: (!) 103 (!) 114 (!) 106 (!) 106  Resp:    18  Temp:    98.7 F (37.1 C)  TempSrc:    Oral  SpO2: 100% 94% 95% 94%  Weight:      Height:        General: Pt is alert, awake, not in acute distress Cardiovascular:  S1/S2 normal, no rubs, no gallops Respiratory: diminished breath sounds b/l, no wheezing, no rhonchi Abdominal: Soft, NT, obese, bowel sounds + Extremities:  no cyanosis    The results of significant diagnostics from this hospitalization (including imaging, microbiology, ancillary and laboratory) are listed below for reference.     Microbiology: Recent Results (from  the past 240 hour(s))  Blood Culture (routine x 2)     Status: None   Collection Time: 05/04/19  1:23 PM   Specimen: BLOOD  Result Value Ref Range Status   Specimen Description BLOOD RIGHT ANTECUBITAL  Final   Special Requests   Final    BOTTLES DRAWN AEROBIC AND ANAEROBIC Blood Culture results may not be optimal due to an excessive volume of blood received in culture bottles   Culture   Final    NO GROWTH 5 DAYS Performed at [facility], [address] [place], [place] [address]    Report Status 05/09/2019 FINAL  Final  Blood Culture (routine x 2)     Status: None   Collection Time: 05/04/19  1:36 PM   Specimen: BLOOD  Result Value Ref Range Status   Specimen Description BLOOD BLOOD LEFT FOREARM  Final   Special Requests   Final    BOTTLES DRAWN AEROBIC AND ANAEROBIC Blood Culture results may not be optimal due to an excessive volume of blood received in culture bottles   Culture   Final    NO GROWTH 5 DAYS Performed at [facility], [address] [place], [place] [address]    Report Status  05/09/2019 FINAL  Final  Urine culture     Status: Abnormal   Collection Time: 05/04/19  3:11 PM   Specimen: In/Out Cath Urine  Result Value Ref Range Status   Specimen Description   Final    IN/OUT CATH URINE Performed at [facility], [address] [place], [place] [address]    Special Requests   Final    NONE Performed at [facility], [address] [place], [place] [address]    Culture MULTIPLE SPECIES PRESENT, SUGGEST RECOLLECTION (A)  Final   Report Status 05/05/2019 FINAL  Final     Labs: BNP (last 3 results) Recent Labs    05/20/18 1229 11/05/18 1219 05/05/19 0722  BNP 18.0 74.0 99991111   Basic Metabolic Panel: Recent Labs  Lab 05/05/19 0500 05/06/19 0402 05/07/19 0523 05/08/19 0455 05/09/19 1742  NA 138 140 140 139 138  K 3.9 4.0 4.3 4.1 3.3*  CL 106 107 107 105 103  CO2 22 23 25 24 24   GLUCOSE 129* 139* 118* 114* 93  BUN 8 11 14 14 17   CREATININE 0.45 0.49 0.40* 0.41* 0.74  CALCIUM 8.7* 9.0 8.8* 8.7* 8.3*  MG 2.4 2.6* 2.5* 2.6*  --   PHOS 3.0 2.9 3.6 3.5  --    Liver Function Tests: Recent Labs  Lab 05/05/19 0500 05/06/19 0402 05/07/19 0523 05/08/19 0455 05/09/19 1742  AST 93* 72* 54* 47* 71*  ALT 75* 74* 71* 65* 70*  ALKPHOS 45 43 39 40 42  BILITOT 0.7 0.5 0.4 0.4 0.6  PROT 8.1 8.1 7.6 7.5 7.2  ALBUMIN 3.5 3.5 3.4* 3.3* 3.4*   No results for input(s): LIPASE, AMYLASE in the last 168 hours. No results for input(s): AMMONIA in the last 168 hours. CBC: Recent Labs  Lab 05/04/19 1120 05/05/19 0500 05/06/19 0402 05/07/19 0523 05/08/19 0455 05/09/19 1742  WBC 12.2* 10.0 14.0* 17.2* 23.4* 21.2*  NEUTROABS 10.0* 8.2* 10.9* 13.5* 17.7*  --   HGB 12.8 12.2 11.9* 13.0 13.3 14.7  HCT 37.4 35.8* 36.3 38.0 39.5 42.8  MCV 74.5* 75.1* 77.4* 75.1* 74.7* 73.8*  PLT 355 359 397 470* 511* 542*   Cardiac Enzymes: No results for input(s): CKTOTAL, CKMB, CKMBINDEX, TROPONINI in the last  168 hours. BNP: Invalid input(s):  POCBNP CBG: Recent Labs  Lab 05/04/19 1849 05/04/19 2224 05/05/19 0908  GLUCAP 92 136* 119*   D-Dimer No results for input(s): DDIMER in the last 72 hours. Hgb A1c No results for input(s): HGBA1C in the last 72 hours. Lipid Profile No results for input(s): CHOL, HDL, LDLCALC, TRIG, CHOLHDL, LDLDIRECT in the last 72 hours. Thyroid function studies No results for input(s): TSH, T4TOTAL, T3FREE, THYROIDAB in the last 72 hours.  Invalid input(s): FREET3 Anemia work up Recent Labs    05/08/19 0455 05/10/19 0249  FERRITIN 116 115  TIBC 225*  --   IRON 59  --    Urinalysis    Component Value Date/Time   COLORURINE YELLOW (A) 05/04/2019 1511   APPEARANCEUR HAZY (A) 05/04/2019 1511   APPEARANCEUR Clear 06/06/2013 2001   LABSPEC 1.015 05/04/2019 1511   LABSPEC 1.013 06/06/2013 2001   PHURINE 5.0 05/04/2019 1511   GLUCOSEU NEGATIVE 05/04/2019 1511   GLUCOSEU Negative 06/06/2013 2001   HGBUR SMALL (A) 05/04/2019 1511   BILIRUBINUR NEGATIVE 05/04/2019 1511   BILIRUBINUR Negative 06/06/2013 2001   KETONESUR NEGATIVE 05/04/2019 1511   PROTEINUR NEGATIVE 05/04/2019 1511   NITRITE POSITIVE (A) 05/04/2019 1511   LEUKOCYTESUR MODERATE (A) 05/04/2019 1511   LEUKOCYTESUR Negative 06/06/2013 2001   Sepsis Labs Invalid input(s): PROCALCITONIN,  WBC,  LACTICIDVEN Microbiology Recent Results (from the past 240 hour(s))  Blood Culture (routine x 2)     Status: None   Collection Time: 05/04/19  1:23 PM   Specimen: BLOOD  Result Value Ref Range Status   Specimen Description BLOOD RIGHT ANTECUBITAL  Final   Special Requests   Final    BOTTLES DRAWN AEROBIC AND ANAEROBIC Blood Culture results may not be optimal due to an excessive volume of blood received in culture bottles   Culture   Final    NO GROWTH 5 DAYS Performed at [facility], [address] [place], [place] [address]    Report Status 05/09/2019 FINAL  Final  Blood Culture (routine x 2)     Status: None    Collection Time: 05/04/19  1:36 PM   Specimen: BLOOD  Result Value Ref Range Status   Specimen Description BLOOD BLOOD LEFT FOREARM  Final   Special Requests   Final    BOTTLES DRAWN AEROBIC AND ANAEROBIC Blood Culture results may not be optimal due to an excessive volume of blood received in culture bottles   Culture   Final    NO GROWTH 5 DAYS Performed at [facility], [address] [place], [place] [address]    Report Status 05/09/2019 FINAL  Final  Urine culture     Status: Abnormal   Collection Time: 05/04/19  3:11 PM   Specimen: In/Out Cath Urine  Result Value Ref Range Status   Specimen Description   Final    IN/OUT CATH URINE Performed at [facility], [address] [place], [place] [address]    Special Requests   Final    NONE Performed at [facility], [address] [place],  [address]    Culture MULTIPLE SPECIES PRESENT, [place] (A)  Final   Report Status 05/05/2019 FINAL  Final     Time coordinating discharge: Over 30 minutes  SIGNED:   Wyvonnia Dusky, MD  [facility] 05/10/2019, 12:41 PM Pager   If 7PM-7AM, please contact night-coverage [email or web address] Password [email or web address]

## 2019-05-10 NOTE — Progress Notes (Signed)
PHARMACIST - PHYSICIAN COMMUNICATION  CONCERNING:  Enoxaparin (Lovenox) for DVT Prophylaxis   RECOMMENDATION: Patient was prescribed enoxaprin 40mg  q24 hours for VTE prophylaxis.   Filed Weights   05/09/19 1727  Weight: 255 lb (115.7 kg)    Body mass index is 46.64 kg/m.  Estimated Creatinine Clearance: 132.9 mL/min (by C-G formula based on SCr of 0.74 mg/dL).  Based on [place] patient is candidate for enoxaparin 40mg  every 12 hour dosing due to BMI being >40.  DESCRIPTION: Pharmacy has adjusted enoxaparin dose per [facility] policy.  Patient is now receiving enoxaparin 40mg  every 12 hours.   Pernell Dupre, PharmD, BCPS Clinical Pharmacist 05/10/2019 1:14 AM

## 2019-05-10 NOTE — Consult Note (Signed)
[place] for Enoxaparin Indication: VTE prophylaxis elevated D-dimer COVID positive  Allergies  Allergen Reactions  . Ampicillin Rash and Other (See Comments)    Patient and family cannot remember the specifics of the reaction.  Marland Kitchen Penicillins Hives    Patient Measurements: Height: 5\' 2"  (157.5 cm) Weight: 255 lb (115.7 kg) IBW/kg (Calculated) : 50.1  Vital Signs: Temp: 99.4 F (37.4 C) (12/05 0753) Temp Source: Oral (12/05 0753) BP: 97/65 (12/05 0753) Pulse Rate: 106 (12/05 1100)  Labs: Recent Labs    05/08/19 0455 05/09/19 1742  HGB 13.3 14.7  HCT 39.5 42.8  PLT 511* 542*  CREATININE 0.41* 0.74    Estimated Creatinine Clearance: 132.9 mL/min (by C-G formula based on SCr of 0.74 mg/dL).   Medical History: Past Medical History:  Diagnosis Date  . Hypertension     Medications:  (Not in a hospital admission)  Scheduled:  . dexamethasone (DECADRON) injection  6 mg Intravenous Q24H  . enoxaparin (LOVENOX) injection  60 mg Subcutaneous Q12H   Infusions:   PRN:  Anti-infectives (From admission, onward)   None      Assessment: Pharmacy consulted to order enoxaparin for VTE ppx for elevated d-dimer.   Goal of Therapy:  Monitor platelets by anticoagulation protocol: Yes   Plan:  Since D-dimer is > 500, will give enoxaparin 60 mg BID (~0.5 mg/kg BID. Continue to monitor Hgb/Plt.   Oswald Hillock, PharmD, BCPS 05/10/2019,11:27 AM

## 2019-05-10 NOTE — ED Notes (Signed)
Pt grandmother called and requested the patient call her, I notified the patient, she said she would call her back.

## 2019-05-10 NOTE — Consult Note (Signed)
[place] for Enoxaparin Indication: VTE prophylaxis  Allergies  Allergen Reactions  . Ampicillin Rash and Other (See Comments)    Patient and family cannot remember the specifics of the reaction.  Marland Kitchen Penicillins Hives    Patient Measurements: Height: 5\' 2"  (157.5 cm) Weight: 255 lb (115.7 kg) IBW/kg (Calculated) : 50.1  Vital Signs: Temp: 99.7 F (37.6 C) (12/05 0624) Temp Source: Oral (12/04 2339) BP: 105/49 (12/05 0624) Pulse Rate: 114 (12/05 0624)  Labs: Recent Labs    05/08/19 0455 05/09/19 1742  HGB 13.3 14.7  HCT 39.5 42.8  PLT 511* 542*  CREATININE 0.41* 0.74    Estimated Creatinine Clearance: 132.9 mL/min (by C-G formula based on SCr of 0.74 mg/dL).   Medical History: Past Medical History:  Diagnosis Date  . Hypertension     Medications:  (Not in a hospital admission)  Scheduled:  . dexamethasone (DECADRON) injection  6 mg Intravenous Q24H  . enoxaparin (LOVENOX) injection  40 mg Subcutaneous Q12H  . methyldopa  250 mg Oral Daily   Infusions:   PRN:  Anti-infectives (From admission, onward)   None      Assessment: Pharmacy consulted to order enoxaparin for VTE ppx for elevated d-dimer.   Goal of Therapy:  Monitor platelets by anticoagulation protocol: Yes   Plan:  Since D-dimer is < 500, will give enoxaparin 40 mg BID. Will follow up with d-dimer and adjust enoxaparin dose as appropriate.   Oswald Hillock, PharmD, BCPS 05/10/2019,7:19 AM

## 2019-05-10 NOTE — H&P (Signed)
History and Physical    Yesenia Carrillo [id number] DOB: 1995-06-21 DOA: 05/09/2019  PCP: [facility] [place]  Patient coming from: Home  I have personally briefly reviewed patient's old medical records in [place]  Chief Complaint: Vaginal bleeding  HPI: Yesenia Carrillo is a 23 y.o. female G1P0 with medical history significant of pulmonary embolism,  hypertension, early pregnancy and recent COVID pneumonia who presents with concerns of vaginal bleeding.  Patient was hospitalized from 11/29 -12/3 for acute hypoxic respiratory failure secondary to Covid pneumonia requiring up to 2 L of oxygen.  She finished a course of remdesivir and received 5 out of 10 days of steroids.  She had elevated D-dimer at that time but was unable to get CTA or VQ scan due to early pregnancy with quantitative beta-hCG of 51.  She was discharged home on full dose Lovenox. Patient's main concern today is that she started to noticed vaginal spotting today with clots.  Denies any heavy bleeding states that it was minimal and that she did not really fill up any menstrual pads.  Also noted lower abdominal cramping yesterday that has resolved today.  She denies any shortness of breath and is unsure why she is on oxygen at this time.  Denies any chest pain.  No nausea, vomiting.  Patient reports that her pregnancy was "somewhat planned."  ED Course: She was febrile up to 102.8, tachycardiac up to 130 and had desaturation down to 86% on room air and was placed on 2L. CBC shows leukocytosis of  21.2 and no anemia. CMP had potassium of 3.3. beta HCG of 10 down from 51. CXR shows finding most consistent with multifocal pneumonia.   Review of Systems:  Constitutional: No Weight Change, No Fever ENT/Mouth: No sore throat, No Rhinorrhea Eyes: No Eye Pain, No Vision Changes Cardiovascular: No Chest Pain, no SOB Respiratory: No Cough, No Sputum, No Wheezing, no Dyspnea  Gastrointestinal: No Nausea, No  Vomiting, No Diarrhea, No Constipation, No Pain Genitourinary:No Flank Pain Musculoskeletal: No Arthralgias, No Myalgias Skin: No Skin Lesions, No Pruritus, Neuro: no Weakness, No Numbness Psych: No Anxiety/Panic, No Depression, no decrease appetite Heme/Lymph: No Bruising, No Bleeding  Past Medical History:  Diagnosis Date  . Hypertension     Past Surgical History:  Procedure Laterality Date  . TONSILLECTOMY    . WISDOM TOOTH EXTRACTION       reports that she has been smoking cigars. She has never used smokeless tobacco. She reports current alcohol use. She reports that she does not use drugs.  Allergies  Allergen Reactions  . Ampicillin Rash and Other (See Comments)    Patient and family cannot remember the specifics of the reaction.  Marland Kitchen Penicillins Hives    No family history on file.   Prior to Admission medications   Medication Sig Start Date End Date Taking? Authorizing Provider  ascorbic acid (VITAMIN C) 500 MG tablet Take 1 tablet (500 mg total) by mouth daily. 05/08/19  Yes Wyvonnia Dusky, MD  enoxaparin (LOVENOX) 120 MG/0.8ML injection Inject 0.73 mLs (110 mg total) into the skin 2 (two) times daily for 5 days. 05/08/19 05/13/19 Yes Wyvonnia Dusky, MD  methyldopa (ALDOMET) 250 MG tablet Take 1 tablet (250 mg total) by mouth daily. 05/08/19 06/07/19 Yes Wyvonnia Dusky, MD  predniSONE (DELTASONE) 20 MG tablet Take 2 tablets (40 mg total) by mouth daily for 5 days. 05/08/19 05/13/19 Yes Wyvonnia Dusky, MD  zinc sulfate 220 (50 Zn)  MG capsule Take 1 capsule (220 mg total) by mouth daily. 05/08/19  Yes Wyvonnia Dusky, MD    Physical Exam: Vitals:   05/09/19 2339 05/10/19 0100 05/10/19 0200 05/10/19 0230  BP: 108/72 102/69 94/62 102/63  Pulse: (!) 108 97 96 (!) 103  Resp: 19 14 (!) 27 (!) 30  Temp: 98.9 F (37.2 C)     TempSrc: Oral     SpO2: 98% 100% 99% 99%  Weight:      Height:        Constitutional: Nontoxic appearing obese female laying  flat in bed asleep but awoke easily to voice Vitals:   05/09/19 2339 05/10/19 0100 05/10/19 0200 05/10/19 0230  BP: 108/72 102/69 94/62 102/63  Pulse: (!) 108 97 96 (!) 103  Resp: 19 14 (!) 27 (!) 30  Temp: 98.9 F (37.2 C)     TempSrc: Oral     SpO2: 98% 100% 99% 99%  Weight:      Height:       Eyes: lids and conjunctivae normal ENMT: Mucous membranes are moist.  Neck: normal, supple, no masses Respiratory: clear to auscultation bilaterally, no wheezing, no crackles. Normal respiratory effort on 2 L with 96% oxygen saturation.   Cardiovascular: Regular rate and rhythm, no murmurs / rubs / gallops. No extremity edema.  Abdomen: no tenderness,  Bowel sounds positive.  Musculoskeletal: no clubbing / cyanosis. No joint deformity upper and lower extremities. Good ROM, no contractures. Normal muscle tone.  Skin: no rashes, lesions, ulcers. No induration Neurologic: CN 2-12 grossly intact. Sensation intact. Strength 5/5 in all 4.  Psychiatric: Normal judgment and insight. Alert and oriented x 3. Normal mood.     Labs on Admission: I have personally reviewed following labs and imaging studies  CBC: Recent Labs  Lab 05/04/19 1120 05/05/19 0500 05/06/19 0402 05/07/19 0523 05/08/19 0455 05/09/19 1742  WBC 12.2* 10.0 14.0* 17.2* 23.4* 21.2*  NEUTROABS 10.0* 8.2* 10.9* 13.5* 17.7*  --   HGB 12.8 12.2 11.9* 13.0 13.3 14.7  HCT 37.4 35.8* 36.3 38.0 39.5 42.8  MCV 74.5* 75.1* 77.4* 75.1* 74.7* 73.8*  PLT 355 359 397 470* 511* [id number]*   Basic Metabolic Panel: Recent Labs  Lab 05/05/19 0500 05/06/19 0402 05/07/19 0523 05/08/19 0455 05/09/19 1742  NA 138 140 140 139 138  K 3.9 4.0 4.3 4.1 3.3*  CL 106 107 107 105 103  CO2 22 23 25 24 24   GLUCOSE 129* 139* 118* 114* 93  BUN 8 11 14 14 17   CREATININE 0.45 0.49 0.40* 0.41* 0.74  CALCIUM 8.7* 9.0 8.8* 8.7* 8.3*  MG 2.4 2.6* 2.5* 2.6*  --   PHOS 3.0 2.9 3.6 3.5  --    GFR: Estimated Creatinine Clearance: 132.9 mL/min (by C-G  formula based on SCr of 0.74 mg/dL). Liver Function Tests: Recent Labs  Lab 05/05/19 0500 05/06/19 0402 05/07/19 0523 05/08/19 0455 05/09/19 1742  AST 93* 72* 54* 47* 71*  ALT 75* 74* 71* 65* 70*  ALKPHOS 45 43 39 40 42  BILITOT 0.7 0.5 0.4 0.4 0.6  PROT 8.1 8.1 7.6 7.5 7.2  ALBUMIN 3.5 3.5 3.4* 3.3* 3.4*   No results for input(s): LIPASE, AMYLASE in the last 168 hours. No results for input(s): AMMONIA in the last 168 hours. Coagulation Profile: Recent Labs  Lab 05/04/19 1120  INR 1.0   Cardiac Enzymes: No results for input(s): CKTOTAL, CKMB, CKMBINDEX, TROPONINI in the last 168 hours. BNP (last 3 results) No results for  input(s): PROBNP in the last 8760 hours. HbA1C: No results for input(s): HGBA1C in the last 72 hours. CBG: Recent Labs  Lab 05/04/19 1849 05/04/19 2224 05/05/19 0908  GLUCAP 92 136* 119*   Lipid Profile: No results for input(s): CHOL, HDL, LDLCALC, TRIG, CHOLHDL, LDLDIRECT in the last 72 hours. Thyroid Function Tests: No results for input(s): TSH, T4TOTAL, FREET4, T3FREE, THYROIDAB in the last 72 hours. Anemia Panel: Recent Labs    05/07/19 0523 05/08/19 0455  FERRITIN 149 116  TIBC  --  225*  IRON  --  59   Urine analysis:    Component Value Date/Time   COLORURINE YELLOW (A) 05/04/2019 1511   APPEARANCEUR HAZY (A) 05/04/2019 1511   APPEARANCEUR Clear 06/06/2013 2001   LABSPEC 1.015 05/04/2019 1511   LABSPEC 1.013 06/06/2013 2001   PHURINE 5.0 05/04/2019 1511   GLUCOSEU NEGATIVE 05/04/2019 1511   GLUCOSEU Negative 06/06/2013 2001   HGBUR SMALL (A) 05/04/2019 1511   BILIRUBINUR NEGATIVE 05/04/2019 1511   BILIRUBINUR Negative 06/06/2013 2001   KETONESUR NEGATIVE 05/04/2019 1511   PROTEINUR NEGATIVE 05/04/2019 1511   NITRITE POSITIVE (A) 05/04/2019 1511   LEUKOCYTESUR MODERATE (A) 05/04/2019 1511   LEUKOCYTESUR Negative 06/06/2013 2001    Radiological Exams on Admission: Dg Chest Portable 1 View  Result Date: 05/09/2019 CLINICAL  DATA:  23 year old female with shortness of breath. Positive COVID-19. EXAM: PORTABLE CHEST 1 VIEW COMPARISON:  Chest radiograph dated 05/04/2019 and CT dated 11/05/2018. FINDINGS: Bilateral confluent opacities primarily involving the right lower lobe most consistent with multifocal pneumonia, likely viral or atypical infection such as COVID-19. Clinical correlation is recommended. There is no pleural effusion or pneumothorax. Stable cardiac silhouette. No acute osseous pathology. IMPRESSION: Findings most consistent with multifocal pneumonia. Electronically Signed   By: Anner Crete M.D.   On: 05/09/2019 20:32    EKG: Independently reviewed.   Assessment/Plan  Sepsis and Acute hypoxia secondary to COVID pneumonia - was recently just d/c 12/2 for the same - now on 2L  -Start IV Decadron  - has recently finished a course of remdesivir.  Will not restart. -She was noted to have elevated D-dimer at the last admission but cannot obtain a CTA or VQ scan due to early pregnancy.  Will repeat D-dimer today.  Early pregnancy loss -Patient presented with vaginal spotting and now has decreasing beta hCG has gone from 51-10 on admission -will continue to follow serial beta hCG  Hypertension - continue methyldopa  DVT prophylaxis:.Lovenox Code Status: Full Family Communication: Plan discussed with patient at bedside  disposition Plan: Home with at least 2 midnight stays  Consults called:  Admission status: inpatient    Elma Shands T Morris Longenecker DO [facility]   If 7PM-7AM, please contact night-coverage [email or web address] Password [email or web address]  05/10/2019, 3:09 AM

## 2019-05-11 LAB — C-REACTIVE PROTEIN: CRP: 13.2 mg/dL — ABNORMAL HIGH (ref <1.0)

## 2019-05-15 ENCOUNTER — Other Ambulatory Visit: Payer: Self-pay | Admitting: Internal Medicine

## 2019-05-15 DIAGNOSIS — U071 COVID-19: Secondary | ICD-10-CM

## 2019-05-28 ENCOUNTER — Encounter: Payer: Medicaid Other | Admitting: Obstetrics & Gynecology

## 2019-09-05 ENCOUNTER — Emergency Department
Admission: EM | Admit: 2019-09-05 | Discharge: 2019-09-05 | Disposition: A | Discharge status: Home / Self Care | Payer: Medicaid Other | Attending: Emergency Medicine | Admitting: Emergency Medicine

## 2019-09-05 ENCOUNTER — Other Ambulatory Visit: Payer: Self-pay

## 2019-09-05 ENCOUNTER — Emergency Department: Payer: Medicaid Other

## 2019-09-05 DIAGNOSIS — I10 Essential (primary) hypertension: Secondary | ICD-10-CM | POA: Insufficient documentation

## 2019-09-05 DIAGNOSIS — Z7901 Long term (current) use of anticoagulants: Secondary | ICD-10-CM | POA: Diagnosis not present

## 2019-09-05 DIAGNOSIS — Z720 Tobacco use: Secondary | ICD-10-CM | POA: Insufficient documentation

## 2019-09-05 DIAGNOSIS — R05 Cough: Secondary | ICD-10-CM | POA: Diagnosis not present

## 2019-09-05 DIAGNOSIS — Z8616 Personal history of COVID-19: Secondary | ICD-10-CM | POA: Diagnosis not present

## 2019-09-05 DIAGNOSIS — Z79899 Other long term (current) drug therapy: Secondary | ICD-10-CM | POA: Insufficient documentation

## 2019-09-05 DIAGNOSIS — J069 Acute upper respiratory infection, unspecified: Secondary | ICD-10-CM | POA: Diagnosis not present

## 2019-09-05 DIAGNOSIS — J029 Acute pharyngitis, unspecified: Secondary | ICD-10-CM | POA: Diagnosis present

## 2019-09-05 HISTORY — DX: Morbid (severe) obesity due to excess calories: E66.01

## 2019-09-05 HISTORY — DX: Body Mass Index (BMI) 40.0 and over, adult: Z684

## 2019-09-05 MED ORDER — MAGIC MOUTHWASH W/LIDOCAINE: 5.0000 mL | Freq: Four times a day (QID) | ORAL | 0 refills | Status: DC | PRN

## 2019-09-05 MED ORDER — BENZONATATE 100 MG PO CAPS: 100.0000 mg | ORAL_CAPSULE | Freq: Three times a day (TID) | ORAL | 0 refills | Status: DC | PRN

## 2019-09-05 NOTE — Discharge Instructions (Addendum)
You have been seen in the Emergency Department (ED) today for a likely viral illness.  Please drink plenty of clear fluids (water, Gatorade, chicken broth, etc).  You may use Tylenol and/or Motrin according to label instructions.  You can alternate between the two without any side effects.  Try using the prescribed medications as instructed on the label.  You might also want to try taking a daily allergy medication such as cetirizine (Zyrtec) to see if this helps with your symptoms.  Please follow up with your doctor as listed above.  Call your doctor or return to the Emergency Department (ED) if you are unable to tolerate fluids due to vomiting, have worsening trouble breathing, become extremely tired or difficult to awaken, or if you develop any other symptoms that concern you.'

## 2019-09-05 NOTE — ED Provider Notes (Signed)
[facility] Emergency Department Provider Note  ____________________________________________   First MD Initiated Contact with Patient 09/05/19 0410     (approximate)  I have reviewed the triage vital signs and the nursing notes.   HISTORY  Chief Complaint Sore Throat and Cough    HPI Yesenia Carrillo is a 24 y.o. female with medical history as listed below who presents for evaluation of about 4 days of sore throat, nasal congestion, and mild cough.  She said that she had COVID-19 about 5 months ago and this feels different.  She has no significant allergy history.  She said that when she coughs it makes her throat hurt worse.  She is able to eat and drink and speak normally.  She denies fever, chest pain, abdominal pain, nausea, and vomiting.  Symptoms are mild.  Nothing in particular makes them better or worse.         Past Medical History:  Diagnosis Date  . COVID-19 04/2019   patient reports diagnosis in Nov 2020  . Hypertension   . Morbid obesity with BMI of 40.0-44.9, adult [facility])     Patient Active Problem List   Diagnosis Date Noted  . 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 Essential hypertension 05/10/2019  . Pneumonia due to COVID-19 virus 05/06/2019  . COVID-19 05/04/2019  . Pregnancy 05/04/2019  . Acute cystitis during pregnancy 05/04/2019  . Sepsis ([place]) 11/05/2018  . Acute respiratory failure with hypoxia ([place])   . CAP (community acquired pneumonia) 04/30/2018    Past Surgical History:  Procedure Laterality Date  . TONSILLECTOMY    . WISDOM TOOTH EXTRACTION      Prior to Admission medications   Medication Sig Start Date End Date Taking? Authorizing Provider  ascorbic acid (VITAMIN C) 500 MG tablet Take 1 tablet (500 mg total) by mouth daily. 05/08/19   Wyvonnia Dusky, MD  benzonatate (TESSALON PERLES) 100 MG capsule Take 1 capsule (100 mg total) by mouth 3 (three) times daily as needed for cough. 09/05/19   Hinda Kehr, MD   enoxaparin (LOVENOX) 120 MG/0.8ML injection Inject 0.73 mLs (110 mg total) into the skin 2 (two) times daily for 5 days. 05/08/19 05/13/19  Wyvonnia Dusky, MD  magic mouthwash w/lidocaine SOLN Take 5 mLs by mouth 4 (four) times daily as needed for mouth pain. Swish and spit, do not swallow the solution. 09/05/19   Hinda Kehr, MD  methyldopa (ALDOMET) 250 MG tablet Take 1 tablet (250 mg total) by mouth daily. 05/08/19 06/07/19  Wyvonnia Dusky, MD  zinc sulfate 220 (50 Zn) MG capsule Take 1 capsule (220 mg total) by mouth daily. 05/08/19   Wyvonnia Dusky, MD    Allergies Ampicillin and Penicillins  History reviewed. No pertinent family history.  Social History Social History   Tobacco Use  . Smoking status: Current Every Day Smoker    Types: Cigars  . Smokeless tobacco: Never Used  Substance Use Topics  . Alcohol use: Yes    Comment: monthly  . Drug use: No    Review of Systems Constitutional: No fever/chills Eyes: No visual changes. ENT: +sore throat.   + Nasal congestion. Cardiovascular: Denies chest pain. Respiratory: +Cough.  Denies shortness of breath. Gastrointestinal: No abdominal pain.  No nausea, no vomiting.  No diarrhea.  No constipation. Neurological: Negative for headaches, focal weakness or numbness.   ____________________________________________   PHYSICAL EXAM:  VITAL SIGNS: ED Triage Vitals  Enc Vitals Group     BP  09/05/19 0300 (!) 165/112     Pulse Rate 09/05/19 0306 (!) 108     Resp 09/05/19 0300 20     Temp 09/05/19 0300 98.8 F (37.1 C)     Temp Source 09/05/19 0300 Oral     SpO2 09/05/19 0300 97 %     Weight 09/05/19 0302 111.1 kg (245 lb)     Height 09/05/19 0302 1.575 m (5\' 2" )     Head Circumference --      Peak Flow --      Pain Score 09/05/19 0302 8     Pain Loc --      Pain Edu? --      Excl. in [place]? --     Constitutional: Alert and oriented.  Well-appearing and in no distress. Eyes: Conjunctivae are normal.  Head:  Atraumatic. Nose: Mild congestion/rhinnorhea. Mouth/Throat: Oropharynx is clear with no erythema, no petechiae, and no swelling to suggest a peritonsillar abscess.  No obvious dental issues.  Normal voice, no trismus, tolerating secretions without difficulty. Neck: No stridor.  No meningeal signs.  I do not palpate any submandibular or cervical lymphadenopathy.  She reports some mild anterior submandibular tenderness to palpation on the left side but I do not palpate any abnormalities on the area of tenderness. Cardiovascular: Normal rate, regular rhythm. Good peripheral circulation. Respiratory: Normal respiratory effort.  No retractions. Musculoskeletal: No lower extremity tenderness nor edema. No gross deformities of extremities. Neurologic:  Normal speech and language. No gross focal neurologic deficits are appreciated.  Skin:  Skin is warm, dry and intact. Psychiatric: Mood and affect are normal. Speech and behavior are normal.  ____________________________________________   LABS (all labs ordered are listed, but only abnormal results are displayed)  Labs Reviewed - No data to display ____________________________________________  EKG  No indication for emergent EKG ____________________________________________  RADIOLOGY I, Hinda Kehr, personally viewed and evaluated these images (plain radiographs) as part of my medical decision making, as well as reviewing the written report by the radiologist.  ED MD interpretation: No acute abnormalities identified on chest x-ray.  Official radiology report(s): DG Chest 2 View  Result Date: 09/05/2019 CLINICAL DATA:  Cough EXAM: CHEST - 2 VIEW COMPARISON:  05/09/2019 FINDINGS: The heart size and mediastinal contours are within normal limits. Both lungs are clear. The visualized skeletal structures are unremarkable. IMPRESSION: No active cardiopulmonary disease. Electronically Signed   By: Ulyses Jarred M.D.   On: 09/05/2019 03:29     ____________________________________________   PROCEDURES   Procedure(s) performed (including Critical Care):  Procedures   ____________________________________________   INITIAL IMPRESSION / MDM / [place] / ED COURSE  As part of my medical decision making, I reviewed the following data within the [place] notes reviewed and incorporated, Old chart reviewed, Radiograph reviewed  and Notes from prior ED visits   Mild symptoms that suggest a viral URI with cough, allergies, or COVID-19.  The patient declined COVID-19 testing.  No evidence of peritonsillar abscess, strep pharyngitis, or other acute or emergent medical condition requiring further work-up nor antibiotics.  Chest x-ray is clear.  I provided prescription as listed below and gave my usual and customary management recommendations and return precautions.          ____________________________________________  FINAL CLINICAL IMPRESSION(S) / ED DIAGNOSES  Final diagnoses:  Viral URI with cough     MEDICATIONS GIVEN DURING THIS VISIT:  Medications - No data to display   ED Discharge Orders  Ordered    magic mouthwash w/lidocaine SOLN  4 times daily PRN    Note to Pharmacy: Please mix viscous lidocaine 2% with magic mouthwash solution so that the lidocaine comprises approximately 25 % of the total solution.   09/05/19 0424    benzonatate (TESSALON PERLES) 100 MG capsule  3 times daily PRN     09/05/19 0424          *Please note:  Yesenia Carrillo was evaluated in Emergency Department on 09/05/2019 for the symptoms described in the history of present illness. She was evaluated in the context of the global COVID-19 pandemic, which necessitated consideration that the patient might be at risk for infection with the SARS-CoV-2 virus that causes COVID-19. Institutional protocols and algorithms that pertain to the evaluation of patients at risk for COVID-19 are in a state of  rapid change based on information released by regulatory bodies including the CDC and federal and state organizations. These policies and algorithms were followed during the patient's care in the ED.  Some ED evaluations and interventions may be delayed as a result of limited staffing during the pandemic.*  Note:  This document was prepared using Dragon voice recognition software and may include unintentional dictation errors.   Hinda Kehr, MD 09/05/19 [id number]

## 2019-09-05 NOTE — ED Triage Notes (Signed)
PT ambulatory to front desk c/o cough, nasal congestion and sore throat causing it to be painful to swallow. PT denies covid exposure. No fever, SHOB, CP.

## 2019-09-10 DIAGNOSIS — Z86711 Personal history of pulmonary embolism: Secondary | ICD-10-CM | POA: Insufficient documentation

## 2019-09-10 DIAGNOSIS — I959 Hypotension, unspecified: Secondary | ICD-10-CM | POA: Insufficient documentation

## 2019-09-20 IMAGING — US US EXTREM LOW VENOUS BILAT
1 series · 13 of 24 positions shown · non-contrast
Comparison: None.

CLINICAL DATA: Bilateral lower extremity edema.



[Series 1: us extrem low venous bilat · 13 of 48 slices shown]
[im 1/48]
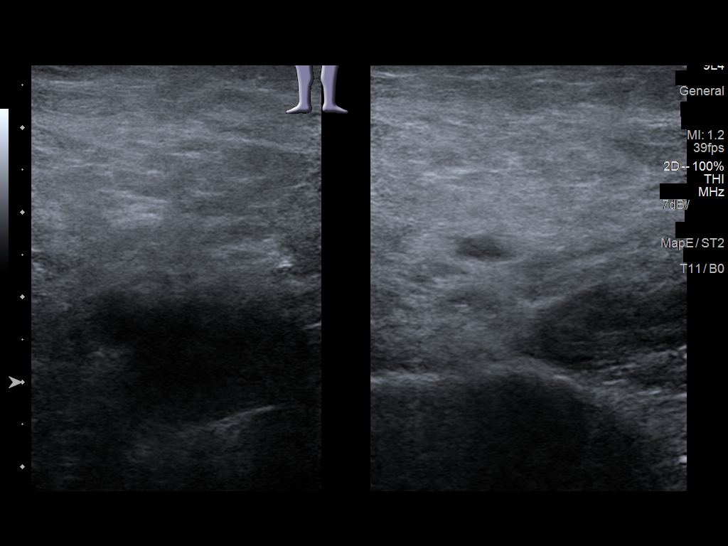
[im 5/48]
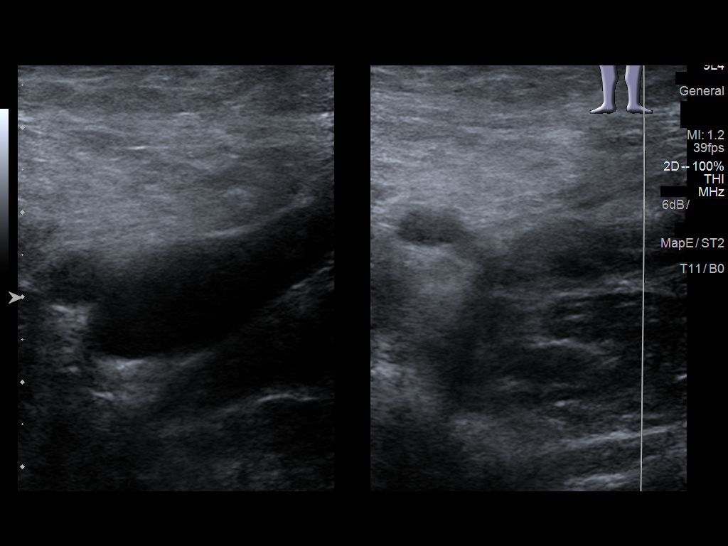
[im 9/48]
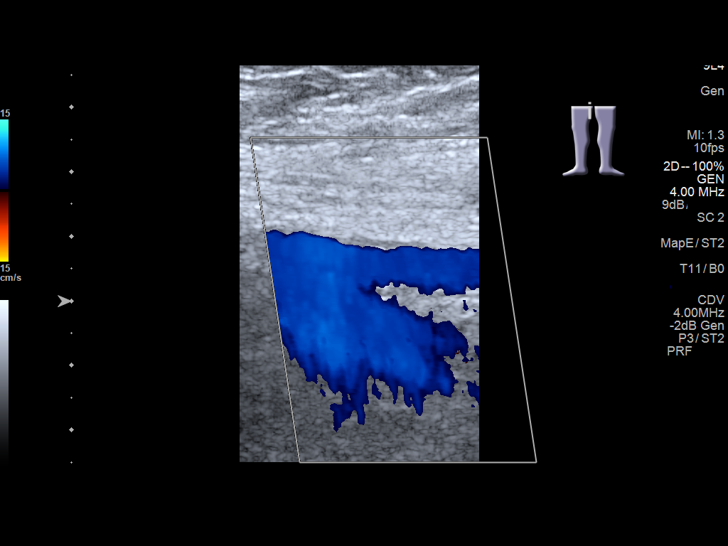
[im 13/48]
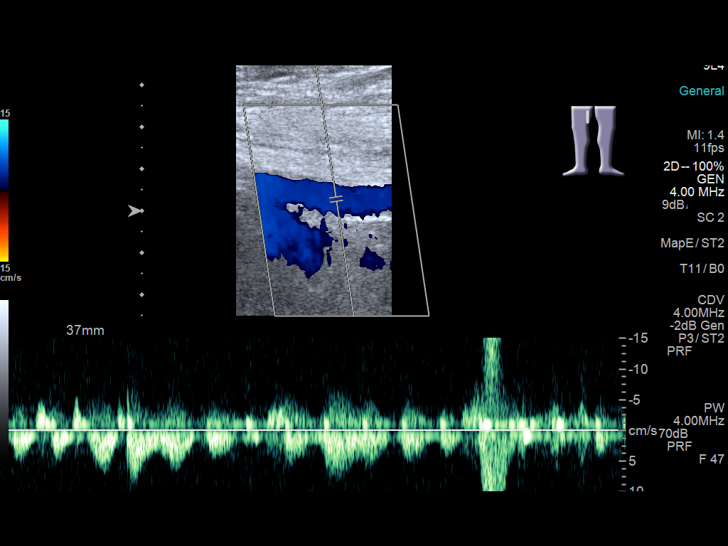
[im 17/48]
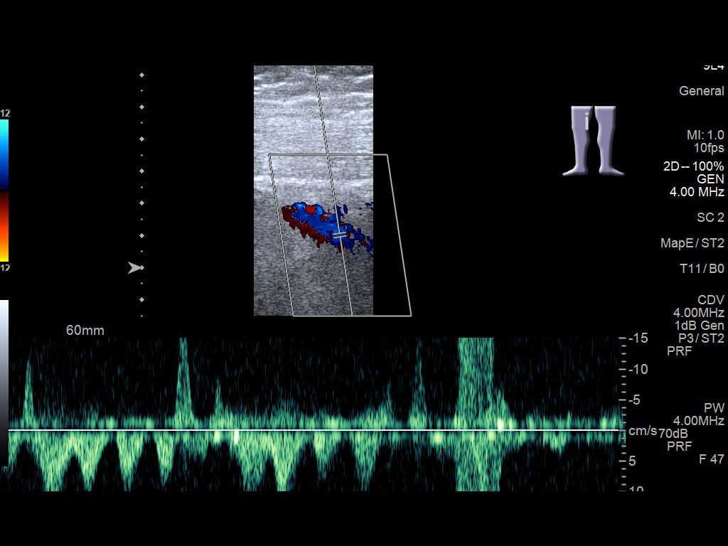
[im 21/48]
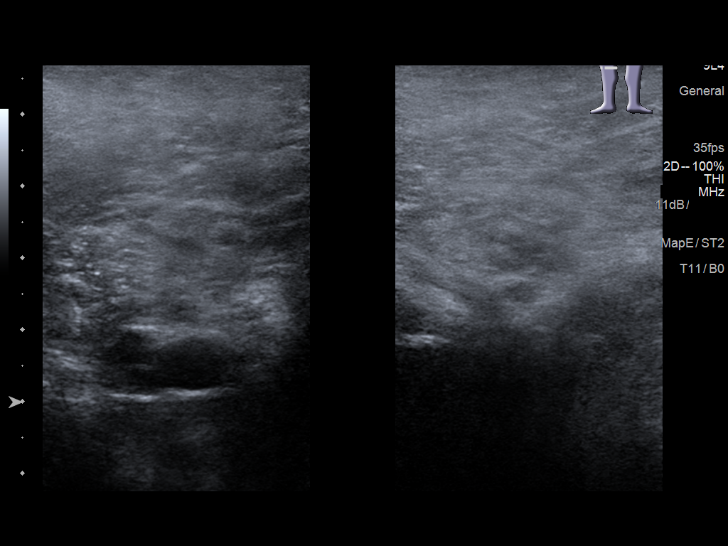
[im 25/48]
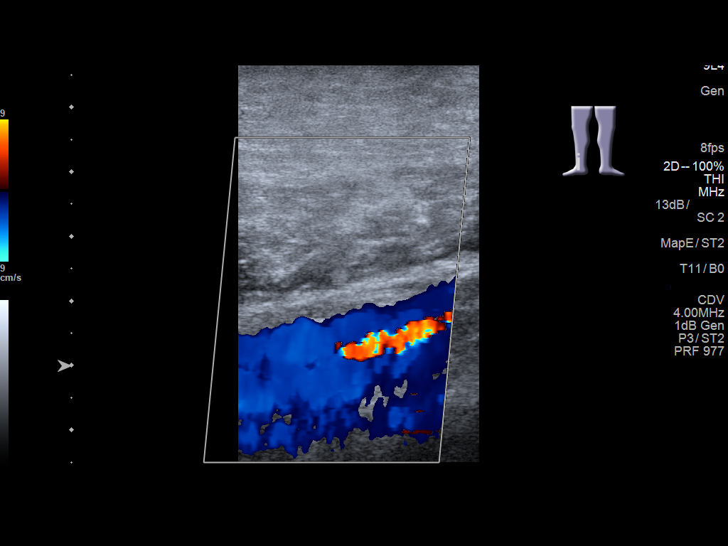
[im 27/48]
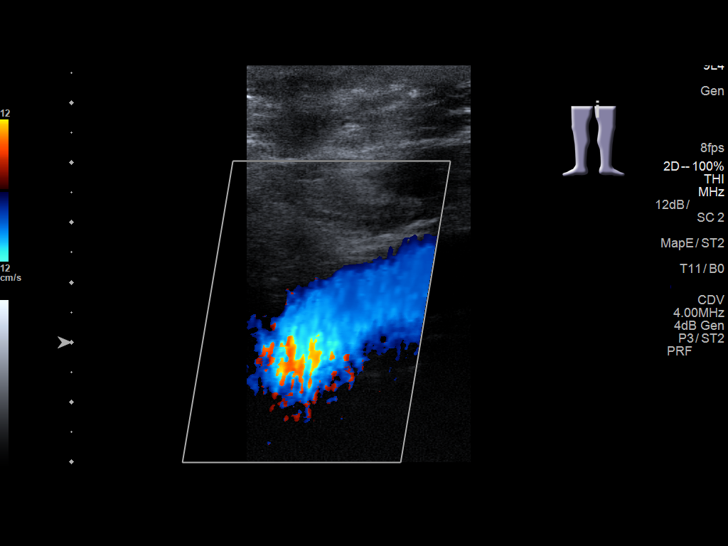
[im 31/48]
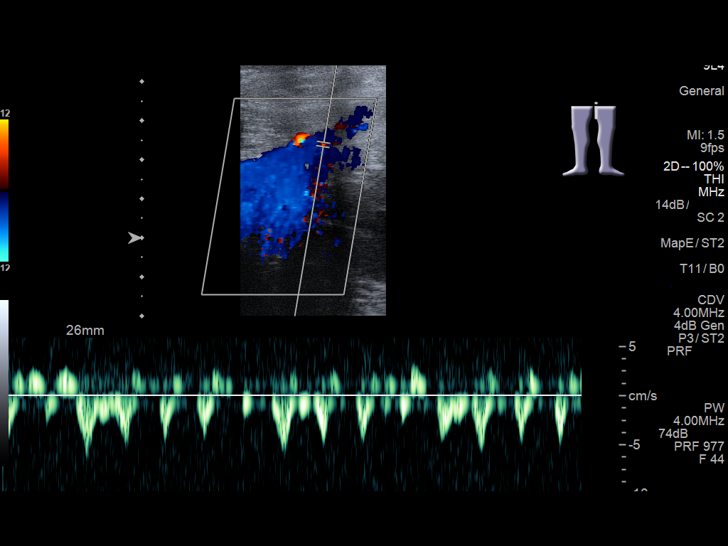
[im 35/48]
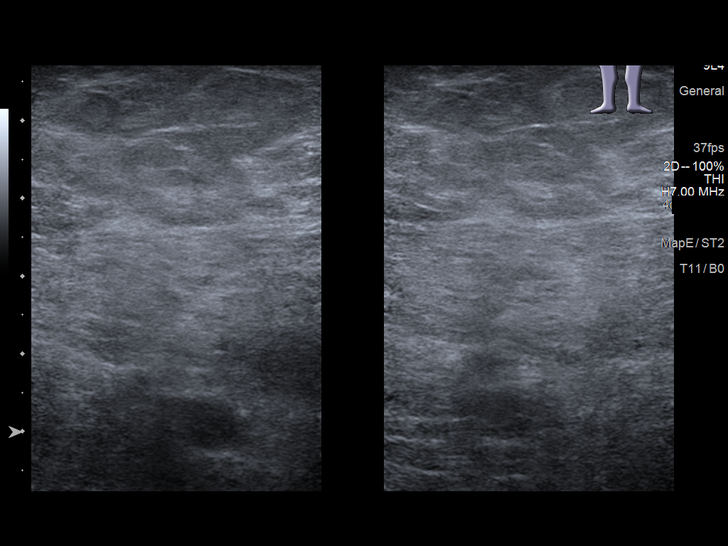
[im 39/48]
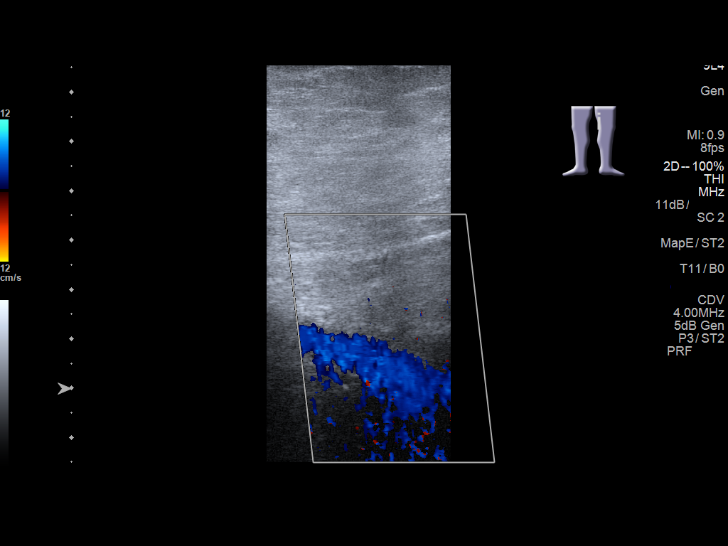
[im 43/48]
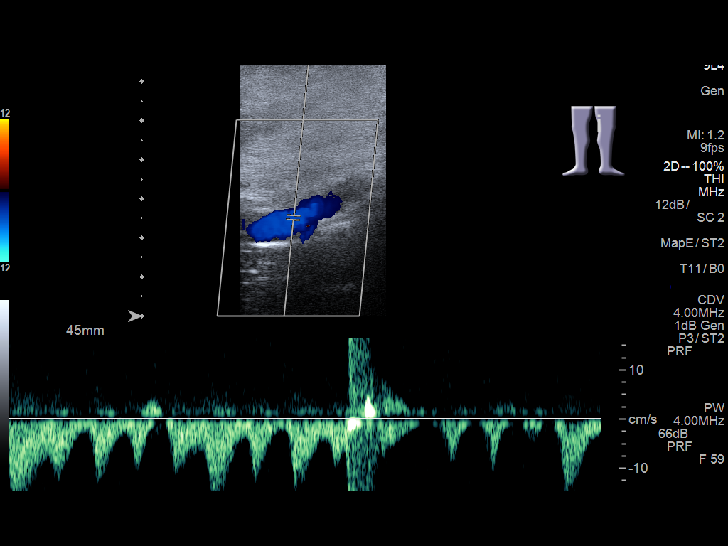
[im 48/48]
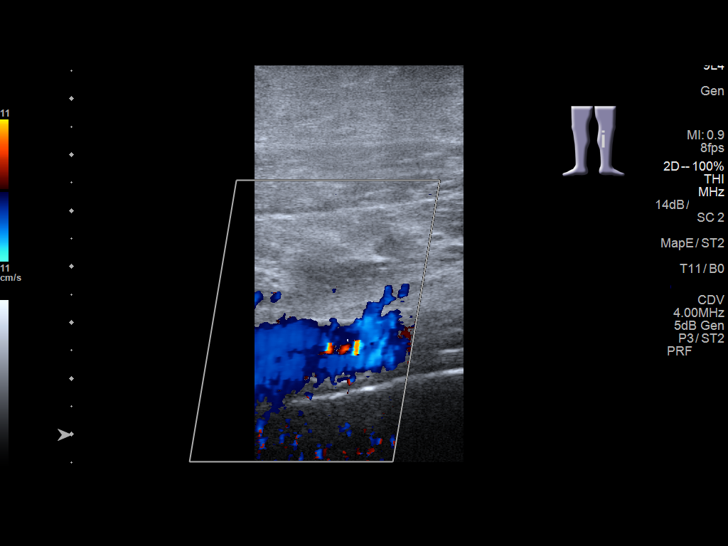

[13 of 24 positions shown; findings below may reference images not displayed]

FINDINGS: RIGHT LOWER EXTREMITY

Common Femoral Vein: No evidence of thrombus. Normal
compressibility, respiratory phasicity and response to augmentation.

Saphenofemoral Junction: No evidence of thrombus. Normal
compressibility and flow on color Doppler imaging.

Profunda Femoral Vein: No evidence of thrombus. Normal
compressibility and flow on color Doppler imaging.

Femoral Vein: No evidence of thrombus. Normal compressibility,
respiratory phasicity and response to augmentation.

Popliteal Vein: No evidence of thrombus. Normal compressibility,
respiratory phasicity and response to augmentation.

Calf Veins: No evidence of thrombus. Normal compressibility and flow
on color Doppler imaging.

Superficial Great Saphenous Vein: No evidence of thrombus. Normal
compressibility.

Venous Reflux:  None.

Other Findings:  None.

LEFT LOWER EXTREMITY

Common Femoral Vein: No evidence of thrombus. Normal
compressibility, respiratory phasicity and response to augmentation.

Saphenofemoral Junction: No evidence of thrombus. Normal
compressibility and flow on color Doppler imaging.

Profunda Femoral Vein: No evidence of thrombus. Normal
compressibility and flow on color Doppler imaging.

Femoral Vein: No evidence of thrombus. Normal compressibility,
respiratory phasicity and response to augmentation.

Popliteal Vein: No evidence of thrombus. Normal compressibility,
respiratory phasicity and response to augmentation.

Calf Veins: No evidence of thrombus. Normal compressibility and flow
on color Doppler imaging.

Superficial Great Saphenous Vein: No evidence of thrombus. Normal
compressibility.

Venous Reflux:  None.

Other Findings:  None.
IMPRESSION: No evidence of bilateral lower extremity deep venous thrombosis.

## 2019-10-29 ENCOUNTER — Emergency Department: Payer: Medicaid Other

## 2019-10-29 ENCOUNTER — Inpatient Hospital Stay
Admission: EM | Admit: 2019-10-29 | Discharge: 2019-11-04 | DRG: 853 | Disposition: A | Discharge status: Other Acute Inpatient Hospital | Payer: Medicaid Other | Attending: Pulmonary Disease | Admitting: Pulmonary Disease

## 2019-10-29 ENCOUNTER — Other Ambulatory Visit: Payer: Self-pay

## 2019-10-29 DIAGNOSIS — D649 Anemia, unspecified: Secondary | ICD-10-CM | POA: Diagnosis present

## 2019-10-29 DIAGNOSIS — Z8759 Personal history of other complications of pregnancy, childbirth and the puerperium: Secondary | ICD-10-CM | POA: Diagnosis not present

## 2019-10-29 DIAGNOSIS — D735 Infarction of spleen: Secondary | ICD-10-CM | POA: Diagnosis present

## 2019-10-29 DIAGNOSIS — I82890 Acute embolism and thrombosis of other specified veins: Secondary | ICD-10-CM | POA: Diagnosis present

## 2019-10-29 DIAGNOSIS — F17210 Nicotine dependence, cigarettes, uncomplicated: Secondary | ICD-10-CM | POA: Diagnosis not present

## 2019-10-29 DIAGNOSIS — J189 Pneumonia, unspecified organism: Secondary | ICD-10-CM | POA: Diagnosis present

## 2019-10-29 DIAGNOSIS — N939 Abnormal uterine and vaginal bleeding, unspecified: Secondary | ICD-10-CM | POA: Diagnosis present

## 2019-10-29 DIAGNOSIS — G4733 Obstructive sleep apnea (adult) (pediatric): Secondary | ICD-10-CM | POA: Diagnosis present

## 2019-10-29 DIAGNOSIS — Z6841 Body Mass Index (BMI) 40.0 and over, adult: Secondary | ICD-10-CM

## 2019-10-29 DIAGNOSIS — R599 Enlarged lymph nodes, unspecified: Secondary | ICD-10-CM | POA: Diagnosis not present

## 2019-10-29 DIAGNOSIS — Z7901 Long term (current) use of anticoagulants: Secondary | ICD-10-CM

## 2019-10-29 DIAGNOSIS — Z8616 Personal history of COVID-19: Secondary | ICD-10-CM | POA: Diagnosis not present

## 2019-10-29 DIAGNOSIS — I5031 Acute diastolic (congestive) heart failure: Secondary | ICD-10-CM | POA: Diagnosis present

## 2019-10-29 DIAGNOSIS — Z880 Allergy status to penicillin: Secondary | ICD-10-CM

## 2019-10-29 DIAGNOSIS — Z01818 Encounter for other preprocedural examination: Secondary | ICD-10-CM

## 2019-10-29 DIAGNOSIS — J9600 Acute respiratory failure, unspecified whether with hypoxia or hypercapnia: Secondary | ICD-10-CM

## 2019-10-29 DIAGNOSIS — I110 Hypertensive heart disease with heart failure: Secondary | ICD-10-CM | POA: Diagnosis present

## 2019-10-29 DIAGNOSIS — J80 Acute respiratory distress syndrome: Secondary | ICD-10-CM | POA: Diagnosis not present

## 2019-10-29 DIAGNOSIS — A419 Sepsis, unspecified organism: Principal | ICD-10-CM

## 2019-10-29 DIAGNOSIS — Z4659 Encounter for fitting and adjustment of other gastrointestinal appliance and device: Secondary | ICD-10-CM

## 2019-10-29 DIAGNOSIS — Z8701 Personal history of pneumonia (recurrent): Secondary | ICD-10-CM

## 2019-10-29 DIAGNOSIS — Z20822 Contact with and (suspected) exposure to covid-19: Secondary | ICD-10-CM | POA: Diagnosis present

## 2019-10-29 DIAGNOSIS — Z452 Encounter for adjustment and management of vascular access device: Secondary | ICD-10-CM

## 2019-10-29 DIAGNOSIS — J9601 Acute respiratory failure with hypoxia: Secondary | ICD-10-CM | POA: Diagnosis not present

## 2019-10-29 DIAGNOSIS — D72829 Elevated white blood cell count, unspecified: Secondary | ICD-10-CM | POA: Diagnosis not present

## 2019-10-29 DIAGNOSIS — Z79899 Other long term (current) drug therapy: Secondary | ICD-10-CM | POA: Diagnosis not present

## 2019-10-29 DIAGNOSIS — R590 Localized enlarged lymph nodes: Secondary | ICD-10-CM | POA: Diagnosis present

## 2019-10-29 DIAGNOSIS — D751 Secondary polycythemia: Secondary | ICD-10-CM | POA: Diagnosis not present

## 2019-10-29 DIAGNOSIS — I10 Essential (primary) hypertension: Secondary | ICD-10-CM | POA: Diagnosis not present

## 2019-10-29 DIAGNOSIS — F17290 Nicotine dependence, other tobacco product, uncomplicated: Secondary | ICD-10-CM | POA: Diagnosis present

## 2019-10-29 LAB — CBC WITH DIFFERENTIAL/PLATELET
Abs Immature Granulocytes: 0.3 10*3/uL — ABNORMAL HIGH (ref 0.00–0.07)
Basophils Absolute: 0.1 10*3/uL (ref 0.0–0.1)
Basophils Relative: 0 %
Eosinophils Absolute: 0 10*3/uL (ref 0.0–0.5)
Eosinophils Relative: 0 %
HCT: 37.1 % (ref 36.0–46.0)
Hemoglobin: 12.1 g/dL (ref 12.0–15.0)
Immature Granulocytes: 1 %
Lymphocytes Relative: 7 %
Lymphs Abs: 2.3 10*3/uL (ref 0.7–4.0)
MCH: 25.7 pg — ABNORMAL LOW (ref 26.0–34.0)
MCHC: 32.6 g/dL (ref 30.0–36.0)
MCV: 78.8 fL — ABNORMAL LOW (ref 80.0–100.0)
Monocytes Absolute: 1.5 10*3/uL — ABNORMAL HIGH (ref 0.1–1.0)
Monocytes Relative: 5 %
Neutro Abs: 29.7 10*3/uL — ABNORMAL HIGH (ref 1.7–7.7)
Neutrophils Relative %: 87 %
Platelets: 482 10*3/uL — ABNORMAL HIGH (ref 150–400)
RBC: 4.71 MIL/uL (ref 3.87–5.11)
RDW: 16.3 % — ABNORMAL HIGH (ref 11.5–15.5)
Smear Review: NORMAL
WBC: 33.9 10*3/uL — ABNORMAL HIGH (ref 4.0–10.5)
nRBC: 0.1 % (ref 0.0–0.2)

## 2019-10-29 LAB — APTT: aPTT: 37 seconds — ABNORMAL HIGH (ref 24–36)

## 2019-10-29 LAB — BLOOD GAS, ARTERIAL
Acid-Base Excess: 1.1 mmol/L (ref 0.0–2.0)
Allens test (pass/fail): POSITIVE — AB
Bicarbonate: 25.1 mmol/L (ref 20.0–28.0)
FIO2: 80
O2 Saturation: 91.6 %
Patient temperature: 37
pCO2 arterial: 37 mmHg (ref 32.0–48.0)
pH, Arterial: 7.44 (ref 7.350–7.450)
pO2, Arterial: 60 mmHg — ABNORMAL LOW (ref 83.0–108.0)

## 2019-10-29 LAB — URINALYSIS, COMPLETE (UACMP) WITH MICROSCOPIC
Bacteria, UA: NONE SEEN
Bilirubin Urine: NEGATIVE
Glucose, UA: NEGATIVE mg/dL
Hgb urine dipstick: NEGATIVE
Ketones, ur: NEGATIVE mg/dL
Nitrite: NEGATIVE
Protein, ur: NEGATIVE mg/dL
Specific Gravity, Urine: 1.015 (ref 1.005–1.030)
pH: 6 (ref 5.0–8.0)

## 2019-10-29 LAB — BRAIN NATRIURETIC PEPTIDE: B Natriuretic Peptide: 69.3 pg/mL (ref 0.0–100.0)

## 2019-10-29 LAB — COMPREHENSIVE METABOLIC PANEL
ALT: 18 U/L (ref 0–44)
AST: 25 U/L (ref 15–41)
Albumin: 4.3 g/dL (ref 3.5–5.0)
Alkaline Phosphatase: 53 U/L (ref 38–126)
Anion gap: 10 (ref 5–15)
BUN: 8 mg/dL (ref 6–20)
CO2: 23 mmol/L (ref 22–32)
Calcium: 9.3 mg/dL (ref 8.9–10.3)
Chloride: 103 mmol/L (ref 98–111)
Creatinine, Ser: 0.56 mg/dL (ref 0.44–1.00)
GFR calc Af Amer: >60 mL/min (ref >60)
GFR calc non Af Amer: >60 mL/min (ref >60)
Glucose, Bld: 151 mg/dL — ABNORMAL HIGH (ref 70–99)
Potassium: 3.4 mmol/L — ABNORMAL LOW (ref 3.5–5.1)
Sodium: 136 mmol/L (ref 135–145)
Total Bilirubin: 1.4 mg/dL — ABNORMAL HIGH (ref 0.3–1.2)
Total Protein: 8.5 g/dL — ABNORMAL HIGH (ref 6.5–8.1)

## 2019-10-29 LAB — CBC
HCT: 33.9 % — ABNORMAL LOW (ref 36.0–46.0)
Hemoglobin: 11.2 g/dL — ABNORMAL LOW (ref 12.0–15.0)
MCH: 26.2 pg (ref 26.0–34.0)
MCHC: 33 g/dL (ref 30.0–36.0)
MCV: 79.4 fL — ABNORMAL LOW (ref 80.0–100.0)
Platelets: 430 10*3/uL — ABNORMAL HIGH (ref 150–400)
RBC: 4.27 MIL/uL (ref 3.87–5.11)
RDW: 16.3 % — ABNORMAL HIGH (ref 11.5–15.5)
WBC: 34.5 10*3/uL — ABNORMAL HIGH (ref 4.0–10.5)
nRBC: 0 % (ref 0.0–0.2)

## 2019-10-29 LAB — TROPONIN I (HIGH SENSITIVITY)
Troponin I (High Sensitivity): 38 ng/L — ABNORMAL HIGH (ref <18)
Troponin I (High Sensitivity): 32 ng/L — ABNORMAL HIGH (ref <18)

## 2019-10-29 LAB — CREATININE, SERUM
Creatinine, Ser: 0.66 mg/dL (ref 0.44–1.00)
GFR calc Af Amer: >60 mL/min (ref >60)
GFR calc non Af Amer: >60 mL/min (ref >60)

## 2019-10-29 LAB — LIPASE, BLOOD: Lipase: 20 U/L (ref 11–51)

## 2019-10-29 LAB — TYPE AND SCREEN
ABO/RH(D): O POS
Antibody Screen: NEGATIVE

## 2019-10-29 LAB — HCG, QUANTITATIVE, PREGNANCY: hCG, Beta Chain, Quant, S: 1 m[IU]/mL (ref <5)

## 2019-10-29 LAB — GLUCOSE, CAPILLARY: Glucose-Capillary: 135 mg/dL — ABNORMAL HIGH (ref 70–99)

## 2019-10-29 LAB — LACTIC ACID, PLASMA
Lactic Acid, Venous: 2.4 mmol/L (ref 0.5–1.9)
Lactic Acid, Venous: 1.4 mmol/L (ref 0.5–1.9)

## 2019-10-29 LAB — PROTIME-INR
INR: 1 (ref 0.8–1.2)
Prothrombin Time: 12.9 seconds (ref 11.4–15.2)

## 2019-10-29 LAB — POCT PREGNANCY, URINE: Preg Test, Ur: NEGATIVE

## 2019-10-29 LAB — SARS CORONAVIRUS 2 BY RT PCR (HOSPITAL ORDER, PERFORMED IN ~~LOC~~ HOSPITAL LAB): SARS Coronavirus 2: NEGATIVE

## 2019-10-29 LAB — PROCALCITONIN: Procalcitonin: 0.19 ng/mL

## 2019-10-29 LAB — MAGNESIUM: Magnesium: 1.7 mg/dL (ref 1.7–2.4)

## 2019-10-29 LAB — AMYLASE: Amylase: 45 U/L (ref 28–100)

## 2019-10-29 MED ORDER — DOCUSATE SODIUM 100 MG PO CAPS: 100.0000 mg | ORAL_CAPSULE | Freq: Two times a day (BID) | ORAL | Status: DC | PRN

## 2019-10-29 MED ORDER — HEPARIN SODIUM (PORCINE) 5000 UNIT/ML IJ SOLN
5000.0000 [IU] | Freq: Three times a day (TID) | INTRAMUSCULAR | Status: DC
  Administered 2019-10-29 – 2019-10-30 (×4): 5000 [IU] via SUBCUTANEOUS
  Filled 2019-10-29 (×4): qty 1

## 2019-10-29 MED ORDER — ONDANSETRON HCL 4 MG/2ML IJ SOLN
4.0000 mg | Freq: Once | INTRAMUSCULAR | Status: AC
  Administered 2019-10-29: 4 mg via INTRAVENOUS
  Filled 2019-10-29: qty 2

## 2019-10-29 MED ORDER — FAMOTIDINE IN NACL 20-0.9 MG/50ML-% IV SOLN
20.0000 mg | Freq: Two times a day (BID) | INTRAVENOUS | Status: DC
  Administered 2019-10-29 – 2019-11-04 (×13): 20 mg via INTRAVENOUS
  Filled 2019-10-29 (×13): qty 50

## 2019-10-29 MED ORDER — SODIUM CHLORIDE 0.9 % IV SOLN
2.0000 g | Freq: Three times a day (TID) | INTRAVENOUS | Status: DC
  Administered 2019-10-29 – 2019-11-04 (×19): 2 g via INTRAVENOUS
  Filled 2019-10-29 (×21): qty 2

## 2019-10-29 MED ORDER — METRONIDAZOLE IN NACL 5-0.79 MG/ML-% IV SOLN
500.0000 mg | Freq: Once | INTRAVENOUS | Status: AC
  Administered 2019-10-29: 500 mg via INTRAVENOUS
  Filled 2019-10-29: qty 100

## 2019-10-29 MED ORDER — SODIUM CHLORIDE 0.9 % IV SOLN
2.0000 g | Freq: Once | INTRAVENOUS | Status: AC
  Administered 2019-10-29: 2 g via INTRAVENOUS
  Filled 2019-10-29: qty 2

## 2019-10-29 MED ORDER — SODIUM CHLORIDE 0.9% FLUSH: 3.0000 mL | INTRAVENOUS | Status: DC | PRN

## 2019-10-29 MED ORDER — FENTANYL CITRATE (PF) 100 MCG/2ML IJ SOLN
75.0000 ug | Freq: Once | INTRAMUSCULAR | Status: AC
  Administered 2019-10-29: 75 ug via INTRAVENOUS
  Filled 2019-10-29: qty 2

## 2019-10-29 MED ORDER — HYDROCODONE-ACETAMINOPHEN 5-325 MG PO TABS: 1.0000 | ORAL_TABLET | ORAL | Status: DC | PRN

## 2019-10-29 MED ORDER — VANCOMYCIN HCL IN DEXTROSE 1-5 GM/200ML-% IV SOLN
1000.0000 mg | Freq: Once | INTRAVENOUS | Status: AC
  Administered 2019-10-29: 1000 mg via INTRAVENOUS
  Filled 2019-10-29: qty 200

## 2019-10-29 MED ORDER — HYDROMORPHONE HCL 1 MG/ML IJ SOLN
1.0000 mg | Freq: Once | INTRAMUSCULAR | Status: AC
  Administered 2019-10-29: 1 mg via INTRAVENOUS
  Filled 2019-10-29: qty 1

## 2019-10-29 MED ORDER — SODIUM CHLORIDE 0.9% FLUSH
3.0000 mL | Freq: Two times a day (BID) | INTRAVENOUS | Status: DC
  Administered 2019-10-30 – 2019-11-04 (×9): 3 mL via INTRAVENOUS

## 2019-10-29 MED ORDER — IOHEXOL 350 MG/ML SOLN
100.0000 mL | Freq: Once | INTRAVENOUS | Status: AC | PRN
  Administered 2019-10-29: 100 mL via INTRAVENOUS

## 2019-10-29 MED ORDER — ONDANSETRON HCL 4 MG/2ML IJ SOLN
4.0000 mg | Freq: Four times a day (QID) | INTRAMUSCULAR | Status: DC | PRN
  Administered 2019-10-30: 4 mg via INTRAVENOUS
  Filled 2019-10-29: qty 2

## 2019-10-29 MED ORDER — SODIUM CHLORIDE 0.9 % IV SOLN
250.0000 mL | INTRAVENOUS | Status: DC | PRN
  Administered 2019-10-30 – 2019-11-02 (×3): 250 mL via INTRAVENOUS

## 2019-10-29 MED ORDER — MORPHINE SULFATE (PF) 2 MG/ML IV SOLN
2.0000 mg | INTRAVENOUS | Status: DC | PRN
  Administered 2019-10-29 (×2): 2 mg via INTRAVENOUS
  Filled 2019-10-29 (×2): qty 1

## 2019-10-29 MED ORDER — HYDROMORPHONE HCL 1 MG/ML IJ SOLN
1.0000 mg | INTRAMUSCULAR | Status: DC | PRN
  Administered 2019-10-29 – 2019-11-02 (×35): 1 mg via INTRAVENOUS
  Filled 2019-10-29 (×36): qty 1

## 2019-10-29 MED ORDER — VANCOMYCIN HCL 1500 MG/300ML IV SOLN
1500.0000 mg | Freq: Two times a day (BID) | INTRAVENOUS | Status: DC
  Administered 2019-10-30: 1500 mg via INTRAVENOUS
  Filled 2019-10-29 (×2): qty 300

## 2019-10-29 MED ORDER — POLYETHYLENE GLYCOL 3350 17 G PO PACK: 17.0000 g | PACK | Freq: Every day | ORAL | Status: DC | PRN

## 2019-10-29 MED ORDER — METHYLPREDNISOLONE SODIUM SUCC 40 MG IJ SOLR
40.0000 mg | Freq: Two times a day (BID) | INTRAMUSCULAR | Status: DC
  Administered 2019-10-29 – 2019-11-01 (×6): 40 mg via INTRAVENOUS
  Filled 2019-10-29 (×6): qty 1

## 2019-10-29 MED ORDER — ACETAMINOPHEN 325 MG PO TABS
650.0000 mg | ORAL_TABLET | ORAL | Status: DC | PRN
  Administered 2019-10-29 – 2019-11-03 (×3): 650 mg via ORAL
  Filled 2019-10-29 (×3): qty 2

## 2019-10-29 NOTE — ED Provider Notes (Addendum)
[facility] Emergency Department Provider Note  ____________________________________________   First MD Initiated Contact with Patient 10/29/19 1209     (approximate)  I have reviewed the triage vital signs and the nursing notes.   HISTORY  Chief Complaint Abdominal Pain    HPI Yesenia Carrillo is a 24 y.o. female with Covid that required intubation, ARDS who comes in with severe abdominal pain.  Patient reports abdominal pain that started yesterday, severe, constant, nothing makes better, nothing makes it worse.  Patient found to be significantly hypoxic in the 20s with a good waveform.  Patient was brought back immediately to a room and started on nonrebreather.  Saturations came up.  Patient also reports some back pain.  Denies any vaginal bleeding.  States that she has been short of breath as well.  States that started yesterday.          Past Medical History:  Diagnosis Date  . COVID-19 04/2019   patient reports diagnosis in Nov 2020  . Hypertension   . Morbid obesity with BMI of 40.0-44.9, adult [facility])     Patient Active Problem List   Diagnosis Date Noted  . 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 Essential hypertension 05/10/2019  . Pneumonia due to COVID-19 virus 05/06/2019  . COVID-19 05/04/2019  . Pregnancy 05/04/2019  . Acute cystitis during pregnancy 05/04/2019  . Sepsis ([place]) 11/05/2018  . Acute respiratory failure with hypoxia ([place])   . CAP (community acquired pneumonia) 04/30/2018    Past Surgical History:  Procedure Laterality Date  . TONSILLECTOMY    . WISDOM TOOTH EXTRACTION      Prior to Admission medications   Medication Sig Start Date End Date Taking? Authorizing Provider  ascorbic acid (VITAMIN C) 500 MG tablet Take 1 tablet (500 mg total) by mouth daily. 05/08/19   Wyvonnia Dusky, MD  benzonatate (TESSALON PERLES) 100 MG capsule Take 1 capsule (100 mg total) by mouth 3 (three) times daily as needed for cough.  09/05/19   Hinda Kehr, MD  enoxaparin (LOVENOX) 120 MG/0.8ML injection Inject 0.73 mLs (110 mg total) into the skin 2 (two) times daily for 5 days. 05/08/19 05/13/19  Wyvonnia Dusky, MD  magic mouthwash w/lidocaine SOLN Take 5 mLs by mouth 4 (four) times daily as needed for mouth pain. Swish and spit, do not swallow the solution. 09/05/19   Hinda Kehr, MD  methyldopa (ALDOMET) 250 MG tablet Take 1 tablet (250 mg total) by mouth daily. 05/08/19 06/07/19  Wyvonnia Dusky, MD  zinc sulfate 220 (50 Zn) MG capsule Take 1 capsule (220 mg total) by mouth daily. 05/08/19   Wyvonnia Dusky, MD    Allergies Ampicillin and Penicillins  No family history on file.  Social History Social History   Tobacco Use  . Smoking status: Current Every Day Smoker    Types: Cigars  . Smokeless tobacco: Never Used  Substance Use Topics  . Alcohol use: Yes    Comment: monthly  . Drug use: No      Review of Systems Constitutional: No fever/chills Eyes: No visual changes. ENT: No sore throat. Cardiovascular: Denies chest pain. Respiratory: Positive shortness of breath Gastrointestinal: Positive abdominal pain no nausea, no vomiting.  No diarrhea.  No constipation. Genitourinary: Negative for dysuria. Musculoskeletal: Positive back pain Skin: Negative for rash. Neurological: Negative for headaches, focal weakness or numbness. All other ROS negative ____________________________________________   PHYSICAL EXAM:  VITAL SIGNS: ED Triage Vitals  Enc Vitals Group  BP --      Pulse Rate 10/29/19 1157 (!) 139     Resp 10/29/19 1157 (!) 30     Temp 10/29/19 1157 99.3 F (37.4 C)     Temp Source 10/29/19 1157 Oral     SpO2 10/29/19 1157 (!) 47 %     Weight 10/29/19 1149 250 lb (113.4 kg)     Height 10/29/19 1149 5\' 1"  (1.549 m)     Head Circumference --      Peak Flow --      Pain Score 10/29/19 1149 10     Pain Loc --      Pain Edu? --      Excl. in [place]? --     Constitutional:  Alert and oriented, significant distress,  appears pale and diaphoretic Eyes: Conjunctivae are normal. EOMI. Head: Atraumatic. Nose: No congestion/rhinnorhea. Mouth/Throat: Mucous membranes are moist.   Neck: No stridor. Trachea Midline. FROM Cardiovascular: Tachycardic, regular rhythm. Grossly normal heart sounds.  Good peripheral circulation. Respiratory: Increased work of breathing, poor air exchange, saturations in the 20s Gastrointestinal: Diffusely tender no distention. No abdominal bruits.  Musculoskeletal: No lower extremity tenderness nor edema.  No joint effusions. Neurologic:  Normal speech and language. No gross focal neurologic deficits are appreciated.  Moving all extremities Skin:  Skin is warm, dry and intact. No rash noted. Psychiatric: Unable to fully assess due to acuity of situation GU: Deferred   ____________________________________________   LABS (all labs ordered are listed, but only abnormal results are displayed)  Labs Reviewed  GLUCOSE, CAPILLARY - Abnormal; Notable for the following components:      Result Value   Glucose-Capillary 135 (*)    All other components within normal limits  CBC WITH DIFFERENTIAL/PLATELET - Abnormal; Notable for the following components:   WBC 33.9 (*)    MCV 78.8 (*)    MCH 25.7 (*)    RDW 16.3 (*)    Platelets 482 (*)    Neutro Abs 29.7 (*)    Monocytes Absolute 1.5 (*)    Abs Immature Granulocytes 0.30 (*)    All other components within normal limits  COMPREHENSIVE METABOLIC PANEL - Abnormal; Notable for the following components:   Potassium 3.4 (*)    Glucose, Bld 151 (*)    Total Protein 8.5 (*)    Total Bilirubin 1.4 (*)    All other components within normal limits  APTT - Abnormal; Notable for the following components:   aPTT 37 (*)    All other components within normal limits  URINALYSIS, COMPLETE (UACMP) WITH MICROSCOPIC - Abnormal; Notable for the following components:   Color, Urine YELLOW (*)    APPearance  CLEAR (*)    Leukocytes,Ua TRACE (*)    All other components within normal limits  BLOOD GAS, ARTERIAL - Abnormal; Notable for the following components:   pO2, Arterial 60 (*)    Allens test (pass/fail) POSITIVE (*)    All other components within normal limits  LACTIC ACID, PLASMA - Abnormal; Notable for the following components:   Lactic Acid, Venous 2.4 (*)    All other components within normal limits  CBC - Abnormal; Notable for the following components:   WBC 34.5 (*)    Hemoglobin 11.2 (*)    HCT 33.9 (*)    MCV 79.4 (*)    RDW 16.3 (*)    Platelets 430 (*)    All other components within normal limits  CBC - Abnormal; Notable for the following  components:   WBC 34.5 (*)    Hemoglobin 11.3 (*)    HCT 34.8 (*)    RDW 16.1 (*)    Platelets 442 (*)    All other components within normal limits  COMPREHENSIVE METABOLIC PANEL - Abnormal; Notable for the following components:   Glucose, Bld 128 (*)    Calcium 8.6 (*)    All other components within normal limits  BLOOD GAS, VENOUS - Abnormal; Notable for the following components:   pO2, Ven 50.0 (*)    All other components within normal limits  TROPONIN I (HIGH SENSITIVITY) - Abnormal; Notable for the following components:   Troponin I (High Sensitivity) 38 (*)    All other components within normal limits  TROPONIN I (HIGH SENSITIVITY) - Abnormal; Notable for the following components:   Troponin I (High Sensitivity) 32 (*)    All other components within normal limits  CULTURE, BLOOD (ROUTINE X 2)  SARS CORONAVIRUS 2 BY RT PCR (HOSPITAL ORDER, [place] [facility])  CULTURE, BLOOD (ROUTINE X 2)  HCG, QUANTITATIVE, PREGNANCY  MAGNESIUM  PROCALCITONIN  PROTIME-INR  BRAIN NATRIURETIC PEPTIDE  LACTIC ACID, PLASMA  CREATININE, SERUM  PROCALCITONIN  STREP PNEUMONIAE URINARY ANTIGEN  LIPASE, BLOOD  AMYLASE  LEGIONELLA PNEUMOPHILA SEROGP 1 UR AG  POC URINE PREG, ED  POCT PREGNANCY, URINE  TYPE AND SCREEN    ____________________________________________   ED ECG REPORT I, Vanessa Bearden, the attending physician, personally viewed and interpreted this ECG.  EKG is sinus tachycardia rate of 125, no ST elevations, T wave inversions in 2 3 aVF V4 through V6 ____________________________________________  RADIOLOGY I, Vanessa [place], personally viewed and evaluated these images (plain radiographs) as part of my medical decision making, as well as reviewing the written report by the radiologist.  ED MD interpretation: Concerning for possible pneumonia in the right lung  Official radiology report(s): CT Angio Chest PE W and/or Wo Contrast  Result Date: 10/29/2019 CLINICAL DATA:  Patient with abdominal distension. Lower abdominal pain. Shortness of breath. EXAM: CT ANGIOGRAPHY CHEST CT ABDOMEN AND PELVIS WITH CONTRAST TECHNIQUE: Multidetector CT imaging of the chest was performed using the standard protocol during bolus administration of intravenous contrast. Multiplanar CT image reconstructions and MIPs were obtained to evaluate the vascular anatomy. Multidetector CT imaging of the abdomen and pelvis was performed using the standard protocol during bolus administration of intravenous contrast. CONTRAST:  121mL OMNIPAQUE IOHEXOL 350 MG/ML SOLN COMPARISON:  CTA chest 11/05/2018 FINDINGS: CTA CHEST FINDINGS Cardiovascular: Heart is enlarged. Trace fluid pericardial recess. Main pulmonary artery is dilated measuring 3.5 cm. No filling defects identified within the pulmonary arterial system to suggest acute pulmonary embolus. Mediastinum/Nodes: No axillary lymphadenopathy. Extensive mediastinal adenopathy is demonstrated. There is a 2.3 cm right paratracheal node (image 26; series 2), previously 1.6 cm. There is a 1.7 cm subcarinal node (image 41; series 2), previously 1.3 cm. Grossly unremarkable esophagus. Lungs/Pleura: Central airways are patent. Bilateral patchy areas of consolidation predominately involving the  lower lobes. Additionally, there is scattered ground-glass opacities bilaterally. No pleural effusion or pneumothorax. Musculoskeletal: No aggressive or acute appearing osseous lesions. Review of the MIP images confirms the above findings. CT ABDOMEN and PELVIS FINDINGS Hepatobiliary: The liver is mildly enlarged. Gallbladder is unremarkable. No intrahepatic or extrahepatic biliary ductal dilatation. Pancreas: Unremarkable Spleen: Spleen is enlarged measuring up to 15 cm. There is geographic increased attenuation involving the cranial and caudal aspects of the spleen. Adrenals/Urinary Tract: Normal adrenal glands. Kidneys enhance symmetrically  with contrast. No hydronephrosis. Urinary bladder is unremarkable. Stomach/Bowel: Normal morphology of the stomach. No abnormal bowel wall thickening or evidence for bowel obstruction. No free fluid or free intraperitoneal air. Vascular/Lymphatic: Normal caliber abdominal aorta. No retroperitoneal lymphadenopathy. Reproductive: Uterus is unremarkable. Adnexal structures unremarkable. Other: Postsurgical changes left inguinal region. Musculoskeletal: No aggressive or acute appearing osseous lesions. Review of the MIP images confirms the above findings. IMPRESSION: 1. No evidence for acute pulmonary embolus. 2. Bilateral patchy areas of consolidation predominately involving the lower lobes with scattered ground-glass opacities. Findings concerning for multifocal pneumonia. 3. Extensive mediastinal adenopathy which may be reactive in etiology. 4. Splenomegaly with geographic areas of high attenuation within the cranial and caudal aspects of the spleen. These may be secondary to geographic perfusion. Given the enlarged size of the spleen and these findings, recommend follow-up CT of the abdomen in 3 months to reassess. Electronically Signed   By: Lovey Newcomer M.D.   On: 10/29/2019 15:57   CT ABDOMEN PELVIS W CONTRAST  Result Date: 10/29/2019 CLINICAL DATA:  Patient with  abdominal distension. Lower abdominal pain. Shortness of breath. EXAM: CT ANGIOGRAPHY CHEST CT ABDOMEN AND PELVIS WITH CONTRAST TECHNIQUE: Multidetector CT imaging of the chest was performed using the standard protocol during bolus administration of intravenous contrast. Multiplanar CT image reconstructions and MIPs were obtained to evaluate the vascular anatomy. Multidetector CT imaging of the abdomen and pelvis was performed using the standard protocol during bolus administration of intravenous contrast. CONTRAST:  158mL OMNIPAQUE IOHEXOL 350 MG/ML SOLN COMPARISON:  CTA chest 11/05/2018 FINDINGS: CTA CHEST FINDINGS Cardiovascular: Heart is enlarged. Trace fluid pericardial recess. Main pulmonary artery is dilated measuring 3.5 cm. No filling defects identified within the pulmonary arterial system to suggest acute pulmonary embolus. Mediastinum/Nodes: No axillary lymphadenopathy. Extensive mediastinal adenopathy is demonstrated. There is a 2.3 cm right paratracheal node (image 26; series 2), previously 1.6 cm. There is a 1.7 cm subcarinal node (image 41; series 2), previously 1.3 cm. Grossly unremarkable esophagus. Lungs/Pleura: Central airways are patent. Bilateral patchy areas of consolidation predominately involving the lower lobes. Additionally, there is scattered ground-glass opacities bilaterally. No pleural effusion or pneumothorax. Musculoskeletal: No aggressive or acute appearing osseous lesions. Review of the MIP images confirms the above findings. CT ABDOMEN and PELVIS FINDINGS Hepatobiliary: The liver is mildly enlarged. Gallbladder is unremarkable. No intrahepatic or extrahepatic biliary ductal dilatation. Pancreas: Unremarkable Spleen: Spleen is enlarged measuring up to 15 cm. There is geographic increased attenuation involving the cranial and caudal aspects of the spleen. Adrenals/Urinary Tract: Normal adrenal glands. Kidneys enhance symmetrically with contrast. No hydronephrosis. Urinary bladder is  unremarkable. Stomach/Bowel: Normal morphology of the stomach. No abnormal bowel wall thickening or evidence for bowel obstruction. No free fluid or free intraperitoneal air. Vascular/Lymphatic: Normal caliber abdominal aorta. No retroperitoneal lymphadenopathy. Reproductive: Uterus is unremarkable. Adnexal structures unremarkable. Other: Postsurgical changes left inguinal region. Musculoskeletal: No aggressive or acute appearing osseous lesions. Review of the MIP images confirms the above findings. IMPRESSION: 1. No evidence for acute pulmonary embolus. 2. Bilateral patchy areas of consolidation predominately involving the lower lobes with scattered ground-glass opacities. Findings concerning for multifocal pneumonia. 3. Extensive mediastinal adenopathy which may be reactive in etiology. 4. Splenomegaly with geographic areas of high attenuation within the cranial and caudal aspects of the spleen. These may be secondary to geographic perfusion. Given the enlarged size of the spleen and these findings, recommend follow-up CT of the abdomen in 3 months to reassess. Electronically Signed   By: Lovey Newcomer  M.D.   On: 10/29/2019 15:57   DG Chest Port 1 View  Result Date: 10/30/2019 CLINICAL DATA:  Lower abdominal pain radiating to back, abdominal tenderness EXAM: PORTABLE CHEST 1 VIEW COMPARISON:  CT 10/29/2019, radiograph 10/29/2019 FINDINGS: Redemonstration of the dense bilateral airspace opacities with a mid to lower lung predominance. No pneumothorax or visible effusion. Stable cardiomegaly. Widening of the upper mediastinal contours compatible with extensive adenopathy seen on comparison CT. Overall appearance is not significantly changed from prior exam. Telemetry leads overlie the chest. No acute osseous or soft tissue abnormality. IMPRESSION: 1. Dense bilateral airspace opacities with a mid to lower lung predominance, similar to prior exam and concerning for multifocal pneumonia 2. Stable cardiomegaly 3.  Mediastinal widening compatible with extensive adenopathy seen on comparison CT. Electronically Signed   By: Lovena Le M.D.   On: 10/30/2019 03:54   DG Chest Portable 1 View  Result Date: 10/29/2019 CLINICAL DATA:  Shortness of breath EXAM: PORTABLE CHEST 1 VIEW COMPARISON:  September 05, 2019 FINDINGS: There is airspace opacity throughout the right mid and lower lung regions. Lungs elsewhere clear. Heart size and pulmonary vascular normal. No adenopathy. Evidence of old trauma involving the lateral left clavicle. IMPRESSION: Airspace opacity consistent with pneumonia in right mid and lower lung regions. Lungs elsewhere clear. Cardiac silhouette within normal limits. Electronically Signed   By: Lowella Grip III M.D.   On: 10/29/2019 12:30    ____________________________________________   PROCEDURES  Procedure(s) performed (including Critical Care):  .Critical Care Performed by: Vanessa [place], MD Authorized by: Vanessa [place], MD   Critical care provider statement:    Critical care time (minutes):  75   Critical care was necessary to treat or prevent imminent or life-threatening deterioration of the following conditions:  Sepsis and respiratory failure   Critical care was time spent personally by me on the following activities:  Discussions with consultants, evaluation of patient's response to treatment, examination of patient, ordering and performing treatments and interventions, ordering and review of laboratory studies, ordering and review of radiographic studies, pulse oximetry, re-evaluation of patient's condition, obtaining history from patient or surrogate and review of old charts  .1-3 Lead EKG Interpretation Performed by: Vanessa [place], MD Authorized by: Vanessa Saxman, MD     Interpretation: abnormal     ECG rate:  130s    ECG rate assessment: tachycardic     Rhythm: sinus tachycardia     Ectopy: none     Conduction: normal        ____________________________________________   INITIAL IMPRESSION / ASSESSMENT AND PLAN / ED COURSE  Jacqlyn L Pugmire was evaluated in Emergency Department on 10/29/2019 for the symptoms described in the history of present illness. She was evaluated in the context of the global COVID-19 pandemic, which necessitated consideration that the patient might be at risk for infection with the SARS-CoV-2 virus that causes COVID-19. Institutional protocols and algorithms that pertain to the evaluation of patients at risk for COVID-19 are in a state of rapid change based on information released by regulatory bodies including the CDC and federal and state organizations. These policies and algorithms were followed during the patient's care in the ED.    Patient comes in acutely sick with saturations in the 20s and tachycardic to the 130s.  Patient started on nonrebreather.  Patient reported having a miscarriage but on review of records this happened months ago.  Point-of-care glucose was normal.  EKG without evidence of STEMI.  A bedside  ultrasound did not show any free fluid in the pelvis.  Bedside cardiac exam was limited due to habitus but no obvious large effusion and grossly normal EF.  Will get straight cath to make sure patient's not pregnant.  Chest x-ray was immediately obtained that was concerning for possible fluid versus pneumonia.  Hold off on fluid given not hypotensive until we make sure that there is no signs of fluid overload/ARDS.  Patient will require CT PE to evaluate for pulmonary embolism as well CT abdomen evaluate for acute abdominal process.  I called respiratory over to place patient on high flow nasal cannula given patient's significant respiratory hypoxia.   12:43 PM at this time patient's chest x-ray concerning for pneumonia patient's white count came back at 33.9.  Therefore code sepsis was activated and will start on broad-spectrum antibiotics.   Again holding off on full fluid  resuscitation given patient is not hypotensive and history of ARDS in the past.  He not want to fluid overload patient.  Her heart rates have come down with the fluids and her lactates only slightly elevated.  On reassessment patient is on the high flow nasal cannula at 80% and her respiratory rate has come down.  She continues to have abdominal pain and will give some IV Dilaudid to help facilitate CT imaging.  On reassessment patient's heart rates come down with the 1 L of fluid blood pressures have remained stable and patient looks well perfused.  We will hold off on further fluid resuscitation at this time but to continue to closely monitor.  Patient went to CT imaging.   Patient admitted to the ICU team for significant respiratory hypoxia and high risk for intubation.     ____________________________________________   FINAL CLINICAL IMPRESSION(S) / ED DIAGNOSES   Final diagnoses:  Acute respiratory failure with hypoxia (HCC)  Sepsis, due to unspecified organism, unspecified whether acute organ dysfunction present ([place])  Community acquired pneumonia, unspecified laterality      MEDICATIONS GIVEN DURING THIS VISIT:  Medications  sodium chloride flush (NS) 0.9 % injection 3 mL (3 mLs Intravenous Not Given 10/29/19 2027)  sodium chloride flush (NS) 0.9 % injection 3 mL (has no administration in time range)  0.9 %  sodium chloride infusion (has no administration in time range)  acetaminophen (TYLENOL) tablet 650 mg (650 mg Oral Given 10/29/19 1700)  docusate sodium (COLACE) capsule 100 mg (has no administration in time range)  polyethylene glycol (MIRALAX / GLYCOLAX) packet 17 g (has no administration in time range)  ondansetron (ZOFRAN) injection 4 mg (4 mg Intravenous Given 10/30/19 0658)  famotidine (PEPCID) IVPB 20 mg premix (0 mg Intravenous Stopped 10/29/19 2256)  heparin injection 5,000 Units (5,000 Units Subcutaneous Given 10/30/19 0508)  methylPREDNISolone sodium succinate  (SOLU-MEDROL) 40 mg/mL injection 40 mg (40 mg Intravenous Given 10/29/19 2036)  ceFEPIme (MAXIPIME) 2 g in sodium chloride 0.9 % 100 mL IVPB (0 g Intravenous Stopped 10/30/19 0654)  vancomycin (VANCOREADY) IVPB 1500 mg/300 mL (has no administration in time range)  HYDROmorphone (DILAUDID) injection 1 mg (1 mg Intravenous Given 10/30/19 0658)  HYDROcodone-acetaminophen (NORCO/VICODIN) 5-325 MG per tablet 1-2 tablet (has no administration in time range)  fentaNYL (SUBLIMAZE) injection 75 mcg (75 mcg Intravenous Given 10/29/19 1223)  ondansetron (ZOFRAN) injection 4 mg (4 mg Intravenous Given 10/29/19 1223)  ceFEPIme (MAXIPIME) 2 g in sodium chloride 0.9 % 100 mL IVPB (0 g Intravenous Stopped 10/29/19 1332)  metroNIDAZOLE (FLAGYL) IVPB 500 mg (0 mg Intravenous Stopped 10/29/19 1406)  vancomycin (VANCOCIN) IVPB 1000 mg/200 mL premix (0 mg Intravenous Stopped 10/29/19 1445)  iohexol (OMNIPAQUE) 350 MG/ML injection 100 mL (100 mLs Intravenous Contrast Given 10/29/19 1442)  HYDROmorphone (DILAUDID) injection 1 mg (1 mg Intravenous Given 10/29/19 1343)  ondansetron (ZOFRAN) injection 4 mg (4 mg Intravenous Given 10/29/19 1342)  vancomycin (VANCOCIN) IVPB 1000 mg/200 mL premix (0 mg Intravenous Stopped 10/29/19 2136)     ED Discharge Orders    None       Note:  This document was prepared using Dragon voice recognition software and may include unintentional dictation errors.   Vanessa [place], MD 10/30/19 [id number]    Vanessa [place], MD 10/30/19 [phone]

## 2019-10-29 NOTE — Progress Notes (Signed)
CODE SEPSIS - PHARMACY COMMUNICATION  **Broad Spectrum Antibiotics should be administered within 1 hour of Sepsis diagnosis**  Time Code Sepsis Called/Page Received: 1248  Antibiotics Ordered: Vancomycin,Cefepime,Flagyl  Time of 1st antibiotic administration: 1302  Additional action taken by pharmacy: none  If necessary, Name of Provider/Nurse Contacted: n/a    Pearla Dubonnet ,PharmD Clinical Pharmacist  10/29/2019  1:55 PM

## 2019-10-29 NOTE — ED Notes (Signed)
Np at bedside

## 2019-10-29 NOTE — ED Notes (Signed)
Unable to obtain second set of blood cultures due to pattient being hard stick. MD aware.

## 2019-10-29 NOTE — ED Notes (Signed)
Pt mother at bedside at this time. Pt mother requesting information on patient, this RN gave as much detail as able. Pt mother requesting to speak to doctor about care and about transfer to [facility]. Doctor notified, will come down shortly.

## 2019-10-29 NOTE — ED Notes (Signed)
Pt family given mean tray and soda at this time per request by this RN. Pt comfortable in bed at this time, no further needs.

## 2019-10-29 NOTE — ED Notes (Signed)
Pt transported to CT ?

## 2019-10-29 NOTE — Consult Note (Signed)
Pharmacy Antibiotic Note  Yesenia Carrillo is a 24 y.o. female admitted on 10/29/2019 with pneumonia.  Pharmacy has been consulted for Vancomycin and Cefepime dosing.  Plan: 1) Vancomycin 1500 mg IV Q 12 hrs. Goal AUC 400-550. Expected AUC: 500.0 Expected Css: 14.5 SCr used: 0.8(actual 0.66)  2) Cefepime 2g IV Q8 hours   Height: 5\' 1"  (154.9 cm) Weight: 113.4 kg (250 lb) IBW/kg (Calculated) : 47.8  Temp (24hrs), Avg:99.3 F (37.4 C), Min:99.3 F (37.4 C), Max:99.3 F (37.4 C)  Recent Labs  Lab 10/29/19 1158 10/29/19 1442 10/29/19 1621  WBC 33.9*  --  34.5*  CREATININE 0.56  --  0.66  LATICACIDVEN 2.4* 1.4  --     Estimated Creatinine Clearance: 128.9 mL/min (by C-G formula based on SCr of 0.66 mg/dL).    Allergies  Allergen Reactions  . Ampicillin Rash and Other (See Comments)    Patient and family cannot remember the specifics of the reaction.  Marland Kitchen Penicillins Hives    Antimicrobials this admission: Vancomycin 5/26 >>  Cefepime 5/26 >>   Microbiology results: 5/26 BCx: pending   Thank you for allowing pharmacy to be a part of this patient's care.  Lance Coon A Paije Goodhart 10/29/2019 8:03 PM

## 2019-10-29 NOTE — ED Notes (Signed)
full rainbow and lactic sen to lab

## 2019-10-29 NOTE — ED Notes (Addendum)
Pt unable to sit still and appear uncomfortable. O2 reading in 40s with good pleth. First RN notified.

## 2019-10-29 NOTE — H&P (Signed)
Name: Yesenia Carrillo MRN: [id number] DOB: 1995/12/22     CONSULTATION DATE: 10/29/2019  REFERRING MD :  Jari Pigg  CHIEF COMPLAINT:  resp distress   The CT chest was Independently Reviewed By Me Today STUDIES:  10/29/19 CT chest b/l opacities and infiltrates   HISTORY OF PRESENT ILLNESS:  24 yo morbidly obese AAF with previous h/o COVID 85 Patient comes via EMS. EMS reports lower abdominal pain that radiates to her back. Pt states this started yesterday. Pt states tenderness to belly.    Patient denies any N/V. Pt states dizziness and feels like she is going to pass out. Patient appears in distress and diaphoretic. Patient states SOB increased WOB  Pt did have recent miscarriage 4-5 months ago.   PREVIOUS HISTORY At [facility] Acute hypoxic/hypercarbic respiratory failure requiring mechanical ventilation H/O  Moderate ARDS, idiopathic Presented intubated from [facility] critically ill with idiopathic ARDS.   Previous MICU admission in Fall 2019 for ARDS requiring ECMO. Although pt reports an infectious prodrome and she was febrile with leukocytosis, infectious workup was mainly unremarkable (COVID-19, eRVP, blood cultures negative).   Unclear etiology unfortunately, she denies recent vaping or inhalation drug use. She does endorse smoking "Black and Molds". Immunoglobulin level was sent and was normal. She will follow up in A&I clinic with Dr. Alycia Rossetti.  ER COURSE Patient now with severe hypoxia On High flow [place] 80% High risk for intubation     PAST MEDICAL HISTORY :   has a past medical history of COVID-19 (04/2019), Hypertension, and Morbid obesity with BMI of 40.0-44.9, adult ([place]).  has a past surgical history that includes Tonsillectomy and Wisdom tooth extraction. Prior to Admission medications   Medication Sig Start Date End Date Taking? Authorizing Provider  ascorbic acid (VITAMIN C) 500 MG tablet Take 1 tablet (500 mg total) by mouth daily. 05/08/19    Wyvonnia Dusky, MD  benzonatate (TESSALON PERLES) 100 MG capsule Take 1 capsule (100 mg total) by mouth 3 (three) times daily as needed for cough. 09/05/19   Hinda Kehr, MD  enoxaparin (LOVENOX) 120 MG/0.8ML injection Inject 0.73 mLs (110 mg total) into the skin 2 (two) times daily for 5 days. 05/08/19 05/13/19  Wyvonnia Dusky, MD  magic mouthwash w/lidocaine SOLN Take 5 mLs by mouth 4 (four) times daily as needed for mouth pain. Swish and spit, do not swallow the solution. 09/05/19   Hinda Kehr, MD  methyldopa (ALDOMET) 250 MG tablet Take 1 tablet (250 mg total) by mouth daily. 05/08/19 06/07/19  Wyvonnia Dusky, MD  zinc sulfate 220 (50 Zn) MG capsule Take 1 capsule (220 mg total) by mouth daily. 05/08/19   Wyvonnia Dusky, MD   Allergies  Allergen Reactions  . Ampicillin Rash and Other (See Comments)    Patient and family cannot remember the specifics of the reaction.  Marland Kitchen Penicillins Hives    FAMILY HISTORY:  family history is not on file. SOCIAL HISTORY:  reports that she has been smoking cigars. She has never used smokeless tobacco. She reports current alcohol use. She reports that she does not use drugs.  REVIEW OF SYSTEMS:   Unable to obtain due to critical illness/hypoxia +asking for pain meds, severe back pain      Estimated body mass index is 47.24 kg/m as calculated from the following:   Height as of this encounter: 5\' 1"  (1.549 m).   Weight as of this encounter: 113.4 kg.    VITAL SIGNS: Temp:  [  99.3 F (37.4 C)] 99.3 F (37.4 C) (05/26 1157) Pulse Rate:  [102-139] 102 (05/26 1600) Resp:  [18-30] 25 (05/26 1600) BP: (140-167)/(97-127) 150/117 (05/26 1600) SpO2:  [47 %-99 %] 94 % (05/26 1600) FiO2 (%):  [85 %] 85 % (05/26 1215) Weight:  [113.4 kg] 113.4 kg (05/26 1149)   No intake/output data recorded. No intake/output data recorded.   SpO2: 94 % O2 Flow Rate (L/min): 45 L/min FiO2 (%): 85 %   Physical Examination:  GENERAL:critically ill  appearing, +resp distress HEAD: Normocephalic, atraumatic.  EYES: Pupils equal, round, reactive to light.  No scleral icterus.  MOUTH: Moist mucosal membrane. NECK: Supple. No JVD.  PULMONARY: +rhonchi, +wheezing CARDIOVASCULAR: S1 and S2. Regular rate and rhythm. No murmurs, rubs, or gallops.  GASTROINTESTINAL: Soft, nontender, -distended.  Positive bowel sounds.  MUSCULOSKELETAL: No swelling, clubbing, or edema.  NEUROLOGIC: lethargic SKIN:intact,warm,dry  MEDICATIONS: I have reviewed all medications and confirmed regimen as documented   CULTURE RESULTS   Recent Results (from the past 240 hour(s))  SARS Coronavirus 2 by RT PCR (hospital order, performed in [facility] lab) Nasopharyngeal Nasopharyngeal Swab     Status: None   Collection Time: 10/29/19 12:39 PM   Specimen: Nasopharyngeal Swab  Result Value Ref Range Status   SARS Coronavirus 2 NEGATIVE NEGATIVE Final    Comment: (NOTE) SARS-CoV-2 target nucleic acids are NOT DETECTED. The SARS-CoV-2 RNA is generally detectable in upper and lower respiratory specimens during the acute phase of infection. The lowest concentration of SARS-CoV-2 viral copies this assay can detect is 250 copies / mL. A negative result does not preclude SARS-CoV-2 infection and should not be used as the sole basis for treatment or other patient management decisions.  A negative result may occur with improper specimen collection / handling, submission of specimen other than nasopharyngeal swab, presence of viral mutation(s) within the areas targeted by this assay, and inadequate number of viral copies (<250 copies / mL). A negative result must be combined with clinical observations, patient history, and epidemiological information. Fact Sheet for Patients:   StrictlyIdeas.no Fact Sheet for Healthcare Providers: BankingDealers.co.za This test is not yet approved or cleared  by the [place]  FDA and has been authorized for detection and/or diagnosis of SARS-CoV-2 by FDA under an Emergency Use Authorization (EUA).  This EUA will remain in effect (meaning this test can be used) for the duration of the COVID-19 declaration under Section 564(b)(1) of the Act, 21 U.S.C. section 360bbb-3(b)(1), unless the authorization is terminated or revoked sooner. Performed at [facility], [place], [place] [address]           IMAGING    CT Angio Chest PE W and/or Wo Contrast  Result Date: 10/29/2019 CLINICAL DATA:  Patient with abdominal distension. Lower abdominal pain. Shortness of breath. EXAM: CT ANGIOGRAPHY CHEST CT ABDOMEN AND PELVIS WITH CONTRAST TECHNIQUE: Multidetector CT imaging of the chest was performed using the standard protocol during bolus administration of intravenous contrast. Multiplanar CT image reconstructions and MIPs were obtained to evaluate the vascular anatomy. Multidetector CT imaging of the abdomen and pelvis was performed using the standard protocol during bolus administration of intravenous contrast. CONTRAST:  162mL OMNIPAQUE IOHEXOL 350 MG/ML SOLN COMPARISON:  CTA chest 11/05/2018 FINDINGS: CTA CHEST FINDINGS Cardiovascular: Heart is enlarged. Trace fluid pericardial recess. Main pulmonary artery is dilated measuring 3.5 cm. No filling defects identified within the pulmonary arterial system to suggest acute pulmonary embolus. Mediastinum/Nodes: No axillary lymphadenopathy. Extensive mediastinal  adenopathy is demonstrated. There is a 2.3 cm right paratracheal node (image 26; series 2), previously 1.6 cm. There is a 1.7 cm subcarinal node (image 41; series 2), previously 1.3 cm. Grossly unremarkable esophagus. Lungs/Pleura: Central airways are patent. Bilateral patchy areas of consolidation predominately involving the lower lobes. Additionally, there is scattered ground-glass opacities bilaterally. No pleural effusion or pneumothorax.  Musculoskeletal: No aggressive or acute appearing osseous lesions. Review of the MIP images confirms the above findings. CT ABDOMEN and PELVIS FINDINGS Hepatobiliary: The liver is mildly enlarged. Gallbladder is unremarkable. No intrahepatic or extrahepatic biliary ductal dilatation. Pancreas: Unremarkable Spleen: Spleen is enlarged measuring up to 15 cm. There is geographic increased attenuation involving the cranial and caudal aspects of the spleen. Adrenals/Urinary Tract: Normal adrenal glands. Kidneys enhance symmetrically with contrast. No hydronephrosis. Urinary bladder is unremarkable. Stomach/Bowel: Normal morphology of the stomach. No abnormal bowel wall thickening or evidence for bowel obstruction. No free fluid or free intraperitoneal air. Vascular/Lymphatic: Normal caliber abdominal aorta. No retroperitoneal lymphadenopathy. Reproductive: Uterus is unremarkable. Adnexal structures unremarkable. Other: Postsurgical changes left inguinal region. Musculoskeletal: No aggressive or acute appearing osseous lesions. Review of the MIP images confirms the above findings. IMPRESSION: 1. No evidence for acute pulmonary embolus. 2. Bilateral patchy areas of consolidation predominately involving the lower lobes with scattered ground-glass opacities. Findings concerning for multifocal pneumonia. 3. Extensive mediastinal adenopathy which may be reactive in etiology. 4. Splenomegaly with geographic areas of high attenuation within the cranial and caudal aspects of the spleen. These may be secondary to geographic perfusion. Given the enlarged size of the spleen and these findings, recommend follow-up CT of the abdomen in 3 months to reassess. Electronically Signed   By: Lovey Newcomer M.D.   On: 10/29/2019 15:57   CT ABDOMEN PELVIS W CONTRAST  Result Date: 10/29/2019 CLINICAL DATA:  Patient with abdominal distension. Lower abdominal pain. Shortness of breath. EXAM: CT ANGIOGRAPHY CHEST CT ABDOMEN AND PELVIS WITH  CONTRAST TECHNIQUE: Multidetector CT imaging of the chest was performed using the standard protocol during bolus administration of intravenous contrast. Multiplanar CT image reconstructions and MIPs were obtained to evaluate the vascular anatomy. Multidetector CT imaging of the abdomen and pelvis was performed using the standard protocol during bolus administration of intravenous contrast. CONTRAST:  133mL OMNIPAQUE IOHEXOL 350 MG/ML SOLN COMPARISON:  CTA chest 11/05/2018 FINDINGS: CTA CHEST FINDINGS Cardiovascular: Heart is enlarged. Trace fluid pericardial recess. Main pulmonary artery is dilated measuring 3.5 cm. No filling defects identified within the pulmonary arterial system to suggest acute pulmonary embolus. Mediastinum/Nodes: No axillary lymphadenopathy. Extensive mediastinal adenopathy is demonstrated. There is a 2.3 cm right paratracheal node (image 26; series 2), previously 1.6 cm. There is a 1.7 cm subcarinal node (image 41; series 2), previously 1.3 cm. Grossly unremarkable esophagus. Lungs/Pleura: Central airways are patent. Bilateral patchy areas of consolidation predominately involving the lower lobes. Additionally, there is scattered ground-glass opacities bilaterally. No pleural effusion or pneumothorax. Musculoskeletal: No aggressive or acute appearing osseous lesions. Review of the MIP images confirms the above findings. CT ABDOMEN and PELVIS FINDINGS Hepatobiliary: The liver is mildly enlarged. Gallbladder is unremarkable. No intrahepatic or extrahepatic biliary ductal dilatation. Pancreas: Unremarkable Spleen: Spleen is enlarged measuring up to 15 cm. There is geographic increased attenuation involving the cranial and caudal aspects of the spleen. Adrenals/Urinary Tract: Normal adrenal glands. Kidneys enhance symmetrically with contrast. No hydronephrosis. Urinary bladder is unremarkable. Stomach/Bowel: Normal morphology of the stomach. No abnormal bowel wall thickening or evidence for bowel  obstruction. No free  fluid or free intraperitoneal air. Vascular/Lymphatic: Normal caliber abdominal aorta. No retroperitoneal lymphadenopathy. Reproductive: Uterus is unremarkable. Adnexal structures unremarkable. Other: Postsurgical changes left inguinal region. Musculoskeletal: No aggressive or acute appearing osseous lesions. Review of the MIP images confirms the above findings. IMPRESSION: 1. No evidence for acute pulmonary embolus. 2. Bilateral patchy areas of consolidation predominately involving the lower lobes with scattered ground-glass opacities. Findings concerning for multifocal pneumonia. 3. Extensive mediastinal adenopathy which may be reactive in etiology. 4. Splenomegaly with geographic areas of high attenuation within the cranial and caudal aspects of the spleen. These may be secondary to geographic perfusion. Given the enlarged size of the spleen and these findings, recommend follow-up CT of the abdomen in 3 months to reassess. Electronically Signed   By: Lovey Newcomer M.D.   On: 10/29/2019 15:57   DG Chest Portable 1 View  Result Date: 10/29/2019 CLINICAL DATA:  Shortness of breath EXAM: PORTABLE CHEST 1 VIEW COMPARISON:  September 05, 2019 FINDINGS: There is airspace opacity throughout the right mid and lower lung regions. Lungs elsewhere clear. Heart size and pulmonary vascular normal. No adenopathy. Evidence of old trauma involving the lateral left clavicle. IMPRESSION: Airspace opacity consistent with pneumonia in right mid and lower lung regions. Lungs elsewhere clear. Cardiac silhouette within normal limits. Electronically Signed   By: Lowella Grip III M.D.   On: 10/29/2019 12:30     Nutrition Status:       CBC    Component Value Date/Time   WBC 34.5 (H) 10/29/2019 1621   RBC 4.27 10/29/2019 1621   HGB 11.2 (L) 10/29/2019 1621   HGB 12.4 06/06/2013 2001   HCT 33.9 (L) 10/29/2019 1621   HCT 36.3 06/06/2013 2001   PLT 430 (H) 10/29/2019 1621   PLT 356 06/06/2013 2001    MCV 79.4 (L) 10/29/2019 1621   MCV 78 (L) 06/06/2013 2001   MCH 26.2 10/29/2019 1621   MCHC 33.0 10/29/2019 1621   RDW 16.3 (H) 10/29/2019 1621   RDW 14.6 (H) 06/06/2013 2001   LYMPHSABS 2.3 10/29/2019 1158   LYMPHSABS 2.5 06/06/2013 2001   MONOABS 1.5 (H) 10/29/2019 1158   MONOABS 1.4 (H) 06/06/2013 2001   EOSABS 0.0 10/29/2019 1158   EOSABS 0.1 06/06/2013 2001   BASOSABS 0.1 10/29/2019 1158   BASOSABS 0.1 06/06/2013 2001   BMP Latest Ref Rng & Units 10/29/2019 05/09/2019 05/08/2019  Glucose 70 - 99 mg/dL 151(H) 93 114(H)  BUN 6 - 20 mg/dL 8 17 14   Creatinine 0.44 - 1.00 mg/dL 0.56 0.74 0.41(L)  Sodium 135 - 145 mmol/L 136 138 139  Potassium 3.5 - 5.1 mmol/L 3.4(L) 3.3(L) 4.1  Chloride 98 - 111 mmol/L 103 103 105  CO2 22 - 32 mmol/L 23 24 24   Calcium 8.9 - 10.3 mg/dL 9.3 8.3(L) 8.7(L)         ASSESSMENT AND PLAN SYNOPSIS   Severe ACUTE Hypoxic and Hypercapnic Respiratory Failure from b/l opacities from severe pneumonia -continue Bronchodilator Therapy Start IV steroids Continue IV abx     Morbid obesity, possible OSA.   Will certainly impact respiratory mechanics    CARDIAC ICU monitoring  ID -continue IV abx as prescibed -follow up cultures  GI GI PROPHYLAXIS as indicated  NUTRITIONAL STATUS DIET--> as tolerated Constipation protocol as indicated   ENDO - will use ICU hypoglycemic\Hyperglycemia protocol if needed    ELECTROLYTES -follow labs as needed -replace as needed -pharmacy consultation and following    DVT/GI PRX ordered and assessed TRANSFUSIONS AS  NEEDED MONITOR FSBS I Assessed the need for Labs I Assessed the need for Foley I Assessed the need for Central Venous Line Family Discussion when available I Assessed the need for Mobilization I made an Assessment of medications to be adjusted accordingly Safety Risk assessment Completed  CASE DISCUSSED IN MULTIDISCIPLINARY ROUNDS WITH ICU TEAM   Critical Care Time devoted to  patient care services described in this note is 35 minutes.   Overall, patient is critically ill, prognosis is guarded.  Patient with Multiorgan failure and at high risk for cardiac arrest and death.   Will call [facility] for Transfer.   Corrin Parker, M.D.  [facility]  Medical Director [place] Director [facility] Cardio-Pulmonary Department

## 2019-10-29 NOTE — ED Triage Notes (Addendum)
Pt comes via [facility]. EMS reports lower abdominal pain that radiates to her back. Pt states this started yesterday. Pt states tenderness to belly.  EMS reports VSS and no other complaints.  Pt denies any N/V. Pt states dizziness and feels like she is going to pass out. Pt appears in distress and diaphoretic. Pt states SOB  Pt did have recent miscarriage 4-5 months ago.

## 2019-10-29 NOTE — Progress Notes (Signed)
PHARMACY -  BRIEF ANTIBIOTIC NOTE   Pharmacy has received consult(s) for Vancomycin and Cefepime from an ED provider.  The patient's profile has been reviewed for ht/wt/allergies/indication/available labs.    One time order(s) placed for Vancomycin 1g IV and Cefepime 2g IV x 1 dose each.  Further antibiotics/pharmacy consults should be ordered by admitting physician if indicated.                       Thank you, Pearla Dubonnet 10/29/2019  12:54 PM

## 2019-10-29 NOTE — ED Notes (Signed)
Pt mother on pt's boyfriends phone and notifies this nurse that the patient needs to be transferred to [place]. Pt mother states that patients pain can't be from pneumonia and that she hasn't ever heard of that before. This RN notified Dr. Mortimer Fries, Dr. Mortimer Fries states that he reached out to [facility] but that [facility] is full and they cannot transfer her there. Doctor ordered pain medication and patient given morphine per order.

## 2019-10-29 NOTE — Sepsis Progress Note (Signed)
Notified provider of need to order lactic acid. ° °

## 2019-10-30 ENCOUNTER — Inpatient Hospital Stay: Payer: Medicaid Other

## 2019-10-30 DIAGNOSIS — J80 Acute respiratory distress syndrome: Secondary | ICD-10-CM

## 2019-10-30 LAB — COMPREHENSIVE METABOLIC PANEL
ALT: 15 U/L (ref 0–44)
AST: 19 U/L (ref 15–41)
Albumin: 3.8 g/dL (ref 3.5–5.0)
Alkaline Phosphatase: 53 U/L (ref 38–126)
Anion gap: 7 (ref 5–15)
BUN: 8 mg/dL (ref 6–20)
CO2: 25 mmol/L (ref 22–32)
Calcium: 8.6 mg/dL — ABNORMAL LOW (ref 8.9–10.3)
Chloride: 104 mmol/L (ref 98–111)
Creatinine, Ser: 0.54 mg/dL (ref 0.44–1.00)
GFR calc Af Amer: >60 mL/min (ref >60)
GFR calc non Af Amer: >60 mL/min (ref >60)
Glucose, Bld: 128 mg/dL — ABNORMAL HIGH (ref 70–99)
Potassium: 4.2 mmol/L (ref 3.5–5.1)
Sodium: 136 mmol/L (ref 135–145)
Total Bilirubin: 0.7 mg/dL (ref 0.3–1.2)
Total Protein: 7.9 g/dL (ref 6.5–8.1)

## 2019-10-30 LAB — CBC
HCT: 34.8 % — ABNORMAL LOW (ref 36.0–46.0)
Hemoglobin: 11.3 g/dL — ABNORMAL LOW (ref 12.0–15.0)
MCH: 26 pg (ref 26.0–34.0)
MCHC: 32.5 g/dL (ref 30.0–36.0)
MCV: 80 fL (ref 80.0–100.0)
Platelets: 442 10*3/uL — ABNORMAL HIGH (ref 150–400)
RBC: 4.35 MIL/uL (ref 3.87–5.11)
RDW: 16.1 % — ABNORMAL HIGH (ref 11.5–15.5)
WBC: 34.5 10*3/uL — ABNORMAL HIGH (ref 4.0–10.5)
nRBC: 0 % (ref 0.0–0.2)

## 2019-10-30 LAB — GLUCOSE, CAPILLARY: Glucose-Capillary: 105 mg/dL — ABNORMAL HIGH (ref 70–99)

## 2019-10-30 LAB — PROCALCITONIN: Procalcitonin: 0.28 ng/mL

## 2019-10-30 LAB — BLOOD GAS, VENOUS
Acid-base deficit: 0.6 mmol/L (ref 0.0–2.0)
Bicarbonate: 25.8 mmol/L (ref 20.0–28.0)
O2 Saturation: 82 %
Patient temperature: 37
pCO2, Ven: 49 mmHg (ref 44.0–60.0)
pH, Ven: 7.33 (ref 7.250–7.430)
pO2, Ven: 50 mmHg — ABNORMAL HIGH (ref 32.0–45.0)

## 2019-10-30 LAB — PROTIME-INR
INR: 1.1 (ref 0.8–1.2)
Prothrombin Time: 13.4 seconds (ref 11.4–15.2)

## 2019-10-30 LAB — MRSA PCR SCREENING: MRSA by PCR: NEGATIVE

## 2019-10-30 LAB — STREP PNEUMONIAE URINARY ANTIGEN: Strep Pneumo Urinary Antigen: NEGATIVE

## 2019-10-30 LAB — APTT: aPTT: 34 seconds (ref 24–36)

## 2019-10-30 MED ORDER — FUROSEMIDE 10 MG/ML IJ SOLN
80.0000 mg | Freq: Once | INTRAMUSCULAR | Status: AC
  Administered 2019-10-30: 80 mg via INTRAVENOUS
  Filled 2019-10-30: qty 8

## 2019-10-30 MED ORDER — CHLORHEXIDINE GLUCONATE CLOTH 2 % EX PADS
6.0000 | MEDICATED_PAD | Freq: Every day | CUTANEOUS | Status: DC
  Administered 2019-10-30 – 2019-11-04 (×5): 6 via TOPICAL

## 2019-10-30 MED ORDER — HEPARIN (PORCINE) 25000 UT/250ML-% IV SOLN
2500.0000 [IU]/h | INTRAVENOUS | Status: DC
  Administered 2019-10-30: 1200 [IU]/h via INTRAVENOUS
  Administered 2019-11-01: 2000 [IU]/h via INTRAVENOUS
  Administered 2019-11-02: 2300 [IU]/h via INTRAVENOUS
  Administered 2019-11-02: 2100 [IU]/h via INTRAVENOUS
  Administered 2019-11-03: 2500 [IU]/h via INTRAVENOUS
  Filled 2019-10-30 (×7): qty 250

## 2019-10-30 MED ORDER — VANCOMYCIN HCL IN DEXTROSE 1-5 GM/200ML-% IV SOLN
1000.0000 mg | Freq: Two times a day (BID) | INTRAVENOUS | Status: DC
  Administered 2019-10-30 – 2019-11-03 (×9): 1000 mg via INTRAVENOUS
  Filled 2019-10-30 (×11): qty 200

## 2019-10-30 MED ORDER — PNEUMOCOCCAL VAC POLYVALENT 25 MCG/0.5ML IJ INJ: 0.5000 mL | INJECTION | INTRAMUSCULAR | Status: DC

## 2019-10-30 MED ORDER — HEPARIN BOLUS VIA INFUSION
4500.0000 [IU] | Freq: Once | INTRAVENOUS | Status: AC
  Administered 2019-10-30: 4500 [IU] via INTRAVENOUS
  Filled 2019-10-30: qty 4500

## 2019-10-30 MED ORDER — KETOROLAC TROMETHAMINE 15 MG/ML IJ SOLN
15.0000 mg | Freq: Three times a day (TID) | INTRAMUSCULAR | Status: DC | PRN
  Administered 2019-10-30 – 2019-11-01 (×4): 15 mg via INTRAVENOUS
  Filled 2019-10-30 (×4): qty 1

## 2019-10-30 MED ORDER — IPRATROPIUM-ALBUTEROL 0.5-2.5 (3) MG/3ML IN SOLN
3.0000 mL | RESPIRATORY_TRACT | Status: DC
  Administered 2019-10-30 – 2019-11-04 (×33): 3 mL via RESPIRATORY_TRACT
  Filled 2019-10-30 (×33): qty 3

## 2019-10-30 MED ORDER — BUDESONIDE 0.25 MG/2ML IN SUSP
0.2500 mg | Freq: Two times a day (BID) | RESPIRATORY_TRACT | Status: DC
  Administered 2019-10-30 – 2019-11-04 (×12): 0.25 mg via RESPIRATORY_TRACT
  Filled 2019-10-30 (×12): qty 2

## 2019-10-30 MED ORDER — GUAIFENESIN-CODEINE 100-10 MG/5ML PO SOLN
5.0000 mL | ORAL | Status: DC | PRN
  Administered 2019-10-30: 5 mL via ORAL
  Filled 2019-10-30: qty 5

## 2019-10-30 NOTE — ED Notes (Signed)
ED TO INPATIENT HANDOFF REPORT  ED Nurse Name and Phone #: dee [phone]  S Name/Age/Gender Yesenia Carrillo 24 y.o. female Room/Bed: ED05A/ED05A  Code Status   Code Status: Full Code  Home/SNF/Other Home Patient oriented to: self, place, time and situation Is this baseline? Yes   Triage Complete: Triage complete  Chief Complaint Acute respiratory failure with hypoxia ([place]) [J96.01]  Triage Note Pt comes via [facility]. EMS reports lower abdominal pain that radiates to her back. Pt states this started yesterday. Pt states tenderness to belly.  EMS reports VSS and no other complaints.  Pt denies any N/V. Pt states dizziness and feels like she is going to pass out. Pt appears in distress and diaphoretic. Pt states SOB  Pt did have recent miscarriage 4-5 months ago.    Allergies Allergies  Allergen Reactions  . Ampicillin Rash and Other (See Comments)    Patient and family cannot remember the specifics of the reaction.  Marland Kitchen Penicillins Hives    Level of Care/Admitting Diagnosis ED Disposition    ED Disposition Condition [facility] Area: [place] [[id number]]  Level of Care: ICU [6]  Covid Evaluation: Symptomatic Person Under Investigation (PUI)  Diagnosis: Acute respiratory failure with hypoxia [facility]) [id number]  Admitting Physician: Flora Lipps [phone]  Attending Physician: Flora Lipps [phone]  Estimated length of stay: 5 - 7 days  Certification:: I certify this patient will need inpatient services for at least 2 midnights       B Medical/Surgery History Past Medical History:  Diagnosis Date  . COVID-19 04/2019   patient reports diagnosis in Nov 2020  . Hypertension   . Morbid obesity with BMI of 40.0-44.9, adult [facility])    Past Surgical History:  Procedure Laterality Date  . TONSILLECTOMY    . WISDOM TOOTH EXTRACTION       A IV Location/Drains/Wounds Patient Lines/Drains/Airways Status   Active Line/Drains/Airways    Name:    Placement date:   Placement time:   Site:   Days:   Peripheral IV 10/29/19 Right Forearm   10/29/19    1201    Forearm   1   Peripheral IV 10/29/19 Left Antecubital   10/29/19    1303    Antecubital   1          Intake/Output Last 24 hours No intake or output data in the 24 hours ending 10/30/19 0741  Labs/Imaging Results for orders placed or performed during the hospital encounter of 10/29/19 (from the past 48 hour(s))  CBC with Differential     Status: Abnormal   Collection Time: 10/29/19 11:58 AM  Result Value Ref Range   WBC 33.9 (H) 4.0 - 10.5 K/uL   RBC 4.71 3.87 - 5.11 MIL/uL   Hemoglobin 12.1 12.0 - 15.0 g/dL   HCT 37.1 36.0 - 46.0 %   MCV 78.8 (L) 80.0 - 100.0 fL   MCH 25.7 (L) 26.0 - 34.0 pg   MCHC 32.6 30.0 - 36.0 g/dL   RDW 16.3 (H) 11.5 - 15.5 %   Platelets 482 (H) 150 - 400 K/uL   nRBC 0.1 0.0 - 0.2 %   Neutrophils Relative % 87 %   Neutro Abs 29.7 (H) 1.7 - 7.7 K/uL   Lymphocytes Relative 7 %   Lymphs Abs 2.3 0.7 - 4.0 K/uL   Monocytes Relative 5 %   Monocytes Absolute 1.5 (H) 0.1 - 1.0 K/uL   Eosinophils Relative 0 %   Eosinophils  Absolute 0.0 0.0 - 0.5 K/uL   Basophils Relative 0 %   Basophils Absolute 0.1 0.0 - 0.1 K/uL   WBC Morphology MORPHOLOGY UNREMARKABLE    RBC Morphology MORPHOLOGY UNREMARKABLE    Smear Review Normal platelet morphology    Immature Granulocytes 1 %   Abs Immature Granulocytes 0.30 (H) 0.00 - 0.07 K/uL    Comment: Performed at [facility], [address] [place], [place] [address]  Comprehensive metabolic panel     Status: Abnormal   Collection Time: 10/29/19 11:58 AM  Result Value Ref Range   Sodium 136 135 - 145 mmol/L   Potassium 3.4 (L) 3.5 - 5.1 mmol/L   Chloride 103 98 - 111 mmol/L   CO2 23 22 - 32 mmol/L   Glucose, Bld 151 (H) 70 - 99 mg/dL    Comment: Glucose reference range applies only to samples taken after fasting for at least 8 hours.   BUN 8 6 - 20 mg/dL   Creatinine, Ser 0.56 0.44 - 1.00 mg/dL    Calcium 9.3 8.9 - 10.3 mg/dL   Total Protein 8.5 (H) 6.5 - 8.1 g/dL   Albumin 4.3 3.5 - 5.0 g/dL   AST 25 15 - 41 U/L   ALT 18 0 - 44 U/L   Alkaline Phosphatase 53 38 - 126 U/L   Total Bilirubin 1.4 (H) 0.3 - 1.2 mg/dL   GFR calc non Af Amer >60 >60 mL/min   GFR calc Af Amer >60 >60 mL/min   Anion gap 10 5 - 15    Comment: Performed at [facility], [place], [place] [address]  Troponin I (High Sensitivity)     Status: Abnormal   Collection Time: 10/29/19 11:58 AM  Result Value Ref Range   Troponin I (High Sensitivity) 38 (H) <18 ng/L    Comment: (NOTE) Elevated high sensitivity troponin I (hsTnI) values and significant  changes across serial measurements may suggest ACS but many other  chronic and acute conditions are known to elevate hsTnI results.  Refer to the "Links" section for chest pain algorithms and additional  guidance. Performed at [facility], [address] [place], [place] [address]   hCG, quantitative, pregnancy     Status: None   Collection Time: 10/29/19 11:58 AM  Result Value Ref Range   hCG, Beta Chain, Quant, S 1 <5 mIU/mL    Comment:          GEST. AGE      CONC.  (mIU/mL)   <=1 WEEK        5 - 50     2 WEEKS       50 - 500     3 WEEKS       100 - 10,000     4 WEEKS     1,000 - 30,000     5 WEEKS     3,500 - 115,000   6-8 WEEKS     12,000 - 270,000    12 WEEKS     15,000 - 220,000        FEMALE AND NON-PREGNANT FEMALE:     LESS THAN 5 mIU/mL Performed at [facility], [address] [place],  [address]   Magnesium     Status: None   Collection Time: 10/29/19 11:58 AM  Result Value Ref Range   Magnesium 1.7 1.7 - 2.4 mg/dL    Comment: Performed at [facility], [place],   [address]  Procalcitonin - Baseline     Status: None   Collection Time: 10/29/19 11:58 AM  Result Value Ref Range   Procalcitonin 0.19 ng/mL    Comment:        Interpretation: PCT  (Procalcitonin) <= 0.5 ng/mL: Systemic infection (sepsis) is not likely. Local bacterial infection is possible. (NOTE)       Sepsis PCT Algorithm           Lower Respiratory Tract                                      Infection PCT Algorithm    ----------------------------     ----------------------------         PCT < 0.25 ng/mL                PCT < 0.10 ng/mL         Strongly encourage             Strongly discourage   discontinuation of antibiotics    initiation of antibiotics    ----------------------------     -----------------------------       PCT 0.25 - 0.50 ng/mL            PCT 0.10 - 0.25 ng/mL               OR       >80% decrease in PCT            Discourage initiation of                                            antibiotics      Encourage discontinuation           of antibiotics    ----------------------------     -----------------------------         PCT >= 0.50 ng/mL              PCT 0.26 - 0.50 ng/mL               AND        <80% decrease in PCT             Encourage initiation of                                             antibiotics       Encourage continuation           of antibiotics    ----------------------------     -----------------------------        PCT >= 0.50 ng/mL                  PCT > 0.50 ng/mL               AND         increase in PCT                  Strongly encourage  initiation of antibiotics    Strongly encourage escalation           of antibiotics                                     -----------------------------                                           PCT <= 0.25 ng/mL                                                 OR                                        > 80% decrease in PCT                                     Discontinue / Do not initiate                                             antibiotics Performed at [facility], [address] [place], [place] [address]   Protime-INR      Status: None   Collection Time: 10/29/19 11:58 AM  Result Value Ref Range   Prothrombin Time 12.9 11.4 - 15.2 seconds   INR 1.0 0.8 - 1.2    Comment: (NOTE) INR goal varies based on device and disease states. Performed at [facility], [address] [place], [place] [address]   APTT     Status: Abnormal   Collection Time: 10/29/19 11:58 AM  Result Value Ref Range   aPTT 37 (H) 24 - 36 seconds    Comment:        IF BASELINE aPTT IS ELEVATED, SUGGEST PATIENT RISK ASSESSMENT BE USED TO DETERMINE APPROPRIATE ANTICOAGULANT THERAPY. Performed at [facility], [place] [place], [place] [address]   Brain natriuretic peptide     Status: None   Collection Time: 10/29/19 11:58 AM  Result Value Ref Range   B Natriuretic Peptide 69.3 0.0 - 100.0 pg/mL    Comment: Performed at [facility], [address] [place], [place] [address]  Lactic acid, plasma     Status: Abnormal   Collection Time: 10/29/19 11:58 AM  Result Value Ref Range   Lactic Acid, Venous 2.4 (HH) 0.5 - 1.9 mmol/L    Comment: CRITICAL RESULT CALLED TO, READ BACK BY AND VERIFIED WITH CHRISSY BRAND RN AT [id number] ON 10/29/19 SNG Performed at [facility], [address] [place], [place] [address]   Glucose, capillary     Status: Abnormal   Collection Time: 10/29/19 12:04 PM  Result Value Ref Range   Glucose-Capillary 135 (H) 70 - 99 mg/dL    Comment: Glucose reference range applies only to samples taken after fasting for at least 8 hours.  Blood gas, arterial  Status: Abnormal   Collection Time: 10/29/19 12:24 PM  Result Value Ref Range   FIO2 80.00    Delivery systems HI FLOW NASAL CANNULA    pH, Arterial 7.44 7.350 - 7.450   pCO2 arterial 37 32.0 - 48.0 mmHg   pO2, Arterial 60 (L) 83.0 - 108.0 mmHg   Bicarbonate 25.1 20.0 - 28.0 mmol/L   Acid-Base Excess 1.1 0.0 - 2.0 mmol/L   O2 Saturation 91.6 %   Patient temperature 37.0    Collection site RIGHT RADIAL    Sample  type ARTERIAL DRAW    Allens test (pass/fail) POSITIVE (A) PASS    Comment: Performed at [facility], [address] [place], [place] [address]  Lipase, blood     Status: None   Collection Time: 10/29/19 12:27 PM  Result Value Ref Range   Lipase 20 11 - 51 U/L    Comment: Performed at [facility], [address] [place], [place] [address]  Amylase     Status: None   Collection Time: 10/29/19 12:27 PM  Result Value Ref Range   Amylase 45 28 - 100 U/L    Comment: Performed at [facility], [address] [place], [place] [address]  Blood culture (routine x 2)     Status: None (Preliminary result)   Collection Time: 10/29/19 12:39 PM   Specimen: BLOOD  Result Value Ref Range   Specimen Description BLOOD LEFT ANTECUBITAL    Special Requests      BOTTLES DRAWN AEROBIC AND ANAEROBIC Blood Culture adequate volume   Culture      NO GROWTH < 24 HOURS Performed at [facility], [address] [place], [place] [address]    Report Status PENDING   Type and screen [place]     Status: None   Collection Time: 10/29/19 12:39 PM  Result Value Ref Range   ABO/RH(D) O POS    Antibody Screen NEG    Sample Expiration      11/01/2019,2359 Performed at [facility], [address] [place], [place] [address]   Urinalysis, Complete w Microscopic     Status: Abnormal   Collection Time: 10/29/19 12:39 PM  Result Value Ref Range   Color, Urine YELLOW (A) YELLOW   APPearance CLEAR (A) CLEAR   Specific Gravity, Urine 1.015 1.005 - 1.030   pH 6.0 5.0 - 8.0   Glucose, UA NEGATIVE NEGATIVE mg/dL   Hgb urine dipstick NEGATIVE NEGATIVE   Bilirubin Urine NEGATIVE NEGATIVE   Ketones, ur NEGATIVE NEGATIVE mg/dL   Protein, ur NEGATIVE NEGATIVE mg/dL   Nitrite NEGATIVE NEGATIVE   Leukocytes,Ua TRACE (A) NEGATIVE   RBC / HPF 0-5 0 - 5 RBC/hpf   WBC, UA 0-5 0 - 5 WBC/hpf   Bacteria, UA NONE SEEN NONE SEEN   Squamous Epithelial  / LPF 0-5 0 - 5   Mucus PRESENT     Comment: Performed at [facility], [address] [place], [place] [address]  SARS Coronavirus 2 by RT PCR (hospital order, performed in [facility] hospital lab) Nasopharyngeal Nasopharyngeal Swab     Status: None   Collection Time: 10/29/19 12:39 PM   Specimen: Nasopharyngeal Swab  Result Value Ref Range   SARS Coronavirus 2 NEGATIVE NEGATIVE    Comment: (NOTE) SARS-CoV-2 target nucleic acids are NOT DETECTED. The SARS-CoV-2 RNA is generally detectable in upper and lower respiratory specimens during the acute phase of infection. The lowest concentration of SARS-CoV-2  viral copies this assay can detect is 250 copies / mL. A negative result does not preclude SARS-CoV-2 infection and should not be used as the sole basis for treatment or other patient management decisions.  A negative result may occur with improper specimen collection / handling, submission of specimen other than nasopharyngeal swab, presence of viral mutation(s) within the areas targeted by this assay, and inadequate number of viral copies (<250 copies / mL). A negative result must be combined with clinical observations, patient history, and epidemiological information. Fact Sheet for Patients:   StrictlyIdeas.no Fact Sheet for Healthcare Providers: BankingDealers.co.za This test is not yet approved or cleared  by the [place] FDA and has been authorized for detection and/or diagnosis of SARS-CoV-2 by FDA under an Emergency Use Authorization (EUA).  This EUA will remain in effect (meaning this test can be used) for the duration of the COVID-19 declaration under Section 564(b)(1) of the Act, 21 U.S.C. section 360bbb-3(b)(1), unless the authorization is terminated or revoked sooner. Performed at [facility], [address] [place], [place] [address]   Strep pneumoniae urinary antigen     Status: None    Collection Time: 10/29/19 12:39 PM  Result Value Ref Range   Strep Pneumo Urinary Antigen NEGATIVE NEGATIVE    Comment:        Infection due to S. pneumoniae cannot be absolutely ruled out since the antigen present may be below the detection limit of the test. Performed at [facility], [address], [place], [place] [address]   Pregnancy, urine POC     Status: None   Collection Time: 10/29/19 12:40 PM  Result Value Ref Range   Preg Test, Ur NEGATIVE NEGATIVE    Comment:        THE SENSITIVITY OF THIS METHODOLOGY IS >24 mIU/mL   Lactic acid, plasma     Status: None   Collection Time: 10/29/19  2:42 PM  Result Value Ref Range   Lactic Acid, Venous 1.4 0.5 - 1.9 mmol/L    Comment: Performed at [facility], [place], [place] [address]  Troponin I (High Sensitivity)     Status: Abnormal   Collection Time: 10/29/19  4:21 PM  Result Value Ref Range   Troponin I (High Sensitivity) 32 (H) <18 ng/L    Comment: (NOTE) Elevated high sensitivity troponin I (hsTnI) values and significant  changes across serial measurements may suggest ACS but many other  chronic and acute conditions are known to elevate hsTnI results.  Refer to the "Links" section for chest pain algorithms and additional  guidance. Performed at [facility], [address] [place], [place] [address]   CBC     Status: Abnormal   Collection Time: 10/29/19  4:21 PM  Result Value Ref Range   WBC 34.5 (H) 4.0 - 10.5 K/uL   RBC 4.27 3.87 - 5.11 MIL/uL   Hemoglobin 11.2 (L) 12.0 - 15.0 g/dL   HCT 33.9 (L) 36.0 - 46.0 %   MCV 79.4 (L) 80.0 - 100.0 fL   MCH 26.2 26.0 - 34.0 pg   MCHC 33.0 30.0 - 36.0 g/dL   RDW 16.3 (H) 11.5 - 15.5 %   Platelets 430 (H) 150 - 400 K/uL   nRBC 0.0 0.0 - 0.2 %    Comment: Performed at [facility], [address] [place], [place] [address]  Creatinine, serum     Status: None   Collection Time: 10/29/19  4:21 PM  Result Value Ref  Range   Creatinine, Ser 0.66 0.44 - 1.00 mg/dL   GFR calc non Af Amer >60 >60 mL/min   GFR calc Af Amer >60 >60 mL/min    Comment: Performed at [facility], [address] [place], [place] [address]  Procalcitonin     Status: None   Collection Time: 10/30/19  4:37 AM  Result Value Ref Range   Procalcitonin 0.28 ng/mL    Comment:        Interpretation: PCT (Procalcitonin) <= 0.5 ng/mL: Systemic infection (sepsis) is not likely. Local bacterial infection is possible. (NOTE)       Sepsis PCT Algorithm           Lower Respiratory Tract                                      Infection PCT Algorithm    ----------------------------     ----------------------------         PCT < 0.25 ng/mL                PCT < 0.10 ng/mL         Strongly encourage             Strongly discourage   discontinuation of antibiotics    initiation of antibiotics    ----------------------------     -----------------------------       PCT 0.25 - 0.50 ng/mL            PCT 0.10 - 0.25 ng/mL               OR       >80% decrease in PCT            Discourage initiation of                                            antibiotics      Encourage discontinuation           of antibiotics    ----------------------------     -----------------------------         PCT >= 0.50 ng/mL              PCT 0.26 - 0.50 ng/mL               AND        <80% decrease in PCT             Encourage initiation of                                             antibiotics       Encourage continuation           of antibiotics    ----------------------------     -----------------------------        PCT >= 0.50 ng/mL                  PCT > 0.50 ng/mL               AND         increase in PCT  Strongly encourage                                      initiation of antibiotics    Strongly encourage escalation           of antibiotics                                     -----------------------------                                            PCT <= 0.25 ng/mL                                                 OR                                        > 80% decrease in PCT                                     Discontinue / Do not initiate                                             antibiotics Performed at [facility], [address] [place], [place] [address]   CBC     Status: Abnormal   Collection Time: 10/30/19  4:37 AM  Result Value Ref Range   WBC 34.5 (H) 4.0 - 10.5 K/uL   RBC 4.35 3.87 - 5.11 MIL/uL   Hemoglobin 11.3 (L) 12.0 - 15.0 g/dL   HCT 34.8 (L) 36.0 - 46.0 %   MCV 80.0 80.0 - 100.0 fL   MCH 26.0 26.0 - 34.0 pg   MCHC 32.5 30.0 - 36.0 g/dL   RDW 16.1 (H) 11.5 - 15.5 %   Platelets 442 (H) 150 - 400 K/uL   nRBC 0.0 0.0 - 0.2 %    Comment: Performed at [facility], [address] [place], [place] [address]  Comprehensive metabolic panel     Status: Abnormal   Collection Time: 10/30/19  4:37 AM  Result Value Ref Range   Sodium 136 135 - 145 mmol/L   Potassium 4.2 3.5 - 5.1 mmol/L   Chloride 104 98 - 111 mmol/L   CO2 25 22 - 32 mmol/L   Glucose, Bld 128 (H) 70 - 99 mg/dL    Comment: Glucose reference range applies only to samples taken after fasting for at least 8 hours.   BUN 8 6 - 20 mg/dL   Creatinine, Ser 0.54 0.44 - 1.00 mg/dL   Calcium 8.6 (L) 8.9 - 10.3 mg/dL   Total Protein 7.9 6.5 - 8.1 g/dL   Albumin 3.8 3.5 - 5.0 g/dL   AST 19 15 - 41 U/L   ALT 15 0 - 44  U/L   Alkaline Phosphatase 53 38 - 126 U/L   Total Bilirubin 0.7 0.3 - 1.2 mg/dL   GFR calc non Af Amer >60 >60 mL/min   GFR calc Af Amer >60 >60 mL/min   Anion gap 7 5 - 15    Comment: Performed at [facility], [address] [place], [place] [address]  Blood gas, venous     Status: Abnormal   Collection Time: 10/30/19  6:49 AM  Result Value Ref Range   pH, Ven 7.33 7.250 - 7.430   pCO2, Ven 49 44.0 - 60.0 mmHg   pO2, Ven 50.0 (H) 32.0 - 45.0 mmHg   Bicarbonate 25.8 20.0 - 28.0 mmol/L    Acid-base deficit 0.6 0.0 - 2.0 mmol/L   O2 Saturation 82.0 %   Patient temperature 37.0    Collection site VENOUS    Sample type VENOUS     Comment: Performed at [facility], [address], [place], [place] [address]   CT Angio Chest PE W and/or Wo Contrast  Result Date: 10/29/2019 CLINICAL DATA:  Patient with abdominal distension. Lower abdominal pain. Shortness of breath. EXAM: CT ANGIOGRAPHY CHEST CT ABDOMEN AND PELVIS WITH CONTRAST TECHNIQUE: Multidetector CT imaging of the chest was performed using the standard protocol during bolus administration of intravenous contrast. Multiplanar CT image reconstructions and MIPs were obtained to evaluate the vascular anatomy. Multidetector CT imaging of the abdomen and pelvis was performed using the standard protocol during bolus administration of intravenous contrast. CONTRAST:  172mL OMNIPAQUE IOHEXOL 350 MG/ML SOLN COMPARISON:  CTA chest 11/05/2018 FINDINGS: CTA CHEST FINDINGS Cardiovascular: Heart is enlarged. Trace fluid pericardial recess. Main pulmonary artery is dilated measuring 3.5 cm. No filling defects identified within the pulmonary arterial system to suggest acute pulmonary embolus. Mediastinum/Nodes: No axillary lymphadenopathy. Extensive mediastinal adenopathy is demonstrated. There is a 2.3 cm right paratracheal node (image 26; series 2), previously 1.6 cm. There is a 1.7 cm subcarinal node (image 41; series 2), previously 1.3 cm. Grossly unremarkable esophagus. Lungs/Pleura: Central airways are patent. Bilateral patchy areas of consolidation predominately involving the lower lobes. Additionally, there is scattered ground-glass opacities bilaterally. No pleural effusion or pneumothorax. Musculoskeletal: No aggressive or acute appearing osseous lesions. Review of the MIP images confirms the above findings. CT ABDOMEN and PELVIS FINDINGS Hepatobiliary: The liver is mildly enlarged. Gallbladder is unremarkable. No intrahepatic or  extrahepatic biliary ductal dilatation. Pancreas: Unremarkable Spleen: Spleen is enlarged measuring up to 15 cm. There is geographic increased attenuation involving the cranial and caudal aspects of the spleen. Adrenals/Urinary Tract: Normal adrenal glands. Kidneys enhance symmetrically with contrast. No hydronephrosis. Urinary bladder is unremarkable. Stomach/Bowel: Normal morphology of the stomach. No abnormal bowel wall thickening or evidence for bowel obstruction. No free fluid or free intraperitoneal air. Vascular/Lymphatic: Normal caliber abdominal aorta. No retroperitoneal lymphadenopathy. Reproductive: Uterus is unremarkable. Adnexal structures unremarkable. Other: Postsurgical changes left inguinal region. Musculoskeletal: No aggressive or acute appearing osseous lesions. Review of the MIP images confirms the above findings. IMPRESSION: 1. No evidence for acute pulmonary embolus. 2. Bilateral patchy areas of consolidation predominately involving the lower lobes with scattered ground-glass opacities. Findings concerning for multifocal pneumonia. 3. Extensive mediastinal adenopathy which may be reactive in etiology. 4. Splenomegaly with geographic areas of high attenuation within the cranial and caudal aspects of the spleen. These may be secondary to geographic perfusion. Given the enlarged size of the spleen and these findings, recommend follow-up CT of the abdomen in 3 months to reassess. Electronically Signed  By: Lovey Newcomer M.D.   On: 10/29/2019 15:57   CT ABDOMEN PELVIS W CONTRAST  Result Date: 10/29/2019 CLINICAL DATA:  Patient with abdominal distension. Lower abdominal pain. Shortness of breath. EXAM: CT ANGIOGRAPHY CHEST CT ABDOMEN AND PELVIS WITH CONTRAST TECHNIQUE: Multidetector CT imaging of the chest was performed using the standard protocol during bolus administration of intravenous contrast. Multiplanar CT image reconstructions and MIPs were obtained to evaluate the vascular anatomy.  Multidetector CT imaging of the abdomen and pelvis was performed using the standard protocol during bolus administration of intravenous contrast. CONTRAST:  168mL OMNIPAQUE IOHEXOL 350 MG/ML SOLN COMPARISON:  CTA chest 11/05/2018 FINDINGS: CTA CHEST FINDINGS Cardiovascular: Heart is enlarged. Trace fluid pericardial recess. Main pulmonary artery is dilated measuring 3.5 cm. No filling defects identified within the pulmonary arterial system to suggest acute pulmonary embolus. Mediastinum/Nodes: No axillary lymphadenopathy. Extensive mediastinal adenopathy is demonstrated. There is a 2.3 cm right paratracheal node (image 26; series 2), previously 1.6 cm. There is a 1.7 cm subcarinal node (image 41; series 2), previously 1.3 cm. Grossly unremarkable esophagus. Lungs/Pleura: Central airways are patent. Bilateral patchy areas of consolidation predominately involving the lower lobes. Additionally, there is scattered ground-glass opacities bilaterally. No pleural effusion or pneumothorax. Musculoskeletal: No aggressive or acute appearing osseous lesions. Review of the MIP images confirms the above findings. CT ABDOMEN and PELVIS FINDINGS Hepatobiliary: The liver is mildly enlarged. Gallbladder is unremarkable. No intrahepatic or extrahepatic biliary ductal dilatation. Pancreas: Unremarkable Spleen: Spleen is enlarged measuring up to 15 cm. There is geographic increased attenuation involving the cranial and caudal aspects of the spleen. Adrenals/Urinary Tract: Normal adrenal glands. Kidneys enhance symmetrically with contrast. No hydronephrosis. Urinary bladder is unremarkable. Stomach/Bowel: Normal morphology of the stomach. No abnormal bowel wall thickening or evidence for bowel obstruction. No free fluid or free intraperitoneal air. Vascular/Lymphatic: Normal caliber abdominal aorta. No retroperitoneal lymphadenopathy. Reproductive: Uterus is unremarkable. Adnexal structures unremarkable. Other: Postsurgical changes left  inguinal region. Musculoskeletal: No aggressive or acute appearing osseous lesions. Review of the MIP images confirms the above findings. IMPRESSION: 1. No evidence for acute pulmonary embolus. 2. Bilateral patchy areas of consolidation predominately involving the lower lobes with scattered ground-glass opacities. Findings concerning for multifocal pneumonia. 3. Extensive mediastinal adenopathy which may be reactive in etiology. 4. Splenomegaly with geographic areas of high attenuation within the cranial and caudal aspects of the spleen. These may be secondary to geographic perfusion. Given the enlarged size of the spleen and these findings, recommend follow-up CT of the abdomen in 3 months to reassess. Electronically Signed   By: Lovey Newcomer M.D.   On: 10/29/2019 15:57   DG Chest Port 1 View  Result Date: 10/30/2019 CLINICAL DATA:  Lower abdominal pain radiating to back, abdominal tenderness EXAM: PORTABLE CHEST 1 VIEW COMPARISON:  CT 10/29/2019, radiograph 10/29/2019 FINDINGS: Redemonstration of the dense bilateral airspace opacities with a mid to lower lung predominance. No pneumothorax or visible effusion. Stable cardiomegaly. Widening of the upper mediastinal contours compatible with extensive adenopathy seen on comparison CT. Overall appearance is not significantly changed from prior exam. Telemetry leads overlie the chest. No acute osseous or soft tissue abnormality. IMPRESSION: 1. Dense bilateral airspace opacities with a mid to lower lung predominance, similar to prior exam and concerning for multifocal pneumonia 2. Stable cardiomegaly 3. Mediastinal widening compatible with extensive adenopathy seen on comparison CT. Electronically Signed   By: Lovena Le M.D.   On: 10/30/2019 03:54   DG Chest Portable 1 View  Result Date:  10/29/2019 CLINICAL DATA:  Shortness of breath EXAM: PORTABLE CHEST 1 VIEW COMPARISON:  September 05, 2019 FINDINGS: There is airspace opacity throughout the right mid and lower  lung regions. Lungs elsewhere clear. Heart size and pulmonary vascular normal. No adenopathy. Evidence of old trauma involving the lateral left clavicle. IMPRESSION: Airspace opacity consistent with pneumonia in right mid and lower lung regions. Lungs elsewhere clear. Cardiac silhouette within normal limits. Electronically Signed   By: Lowella Grip III M.D.   On: 10/29/2019 12:30    Pending Labs Unresulted Labs (From admission, onward)    Start     Ordered   10/30/19 0500  Procalcitonin  Daily,   STAT     10/29/19 1211   10/29/19 1939  Legionella Pneumophila Serogp 1 Ur Ag  Add-on,   AD     10/29/19 1938   10/29/19 1212  Blood culture (routine x 2)  BLOOD CULTURE X 2,   STAT     10/29/19 1211          Vitals/Pain Today's Vitals   10/30/19 0230 10/30/19 0300 10/30/19 0321 10/30/19 0658  BP: (!) 152/119 (!) 157/116    Pulse: (!) 104 (!) 105    Resp: 19 (!) 32    Temp:      TempSrc:      SpO2: 96% 96%    Weight:      Height:      PainSc:   7  7     Isolation Precautions No active isolations  Medications Medications  sodium chloride flush (NS) 0.9 % injection 3 mL (3 mLs Intravenous Not Given 10/29/19 2027)  sodium chloride flush (NS) 0.9 % injection 3 mL (has no administration in time range)  0.9 %  sodium chloride infusion (has no administration in time range)  acetaminophen (TYLENOL) tablet 650 mg (650 mg Oral Given 10/29/19 1700)  docusate sodium (COLACE) capsule 100 mg (has no administration in time range)  polyethylene glycol (MIRALAX / GLYCOLAX) packet 17 g (has no administration in time range)  ondansetron (ZOFRAN) injection 4 mg (4 mg Intravenous Given 10/30/19 0658)  famotidine (PEPCID) IVPB 20 mg premix (0 mg Intravenous Stopped 10/29/19 2256)  heparin injection 5,000 Units (5,000 Units Subcutaneous Given 10/30/19 0508)  methylPREDNISolone sodium succinate (SOLU-MEDROL) 40 mg/mL injection 40 mg (40 mg Intravenous Given 10/29/19 2036)  ceFEPIme (MAXIPIME) 2 g in  sodium chloride 0.9 % 100 mL IVPB (0 g Intravenous Stopped 10/30/19 0654)  vancomycin (VANCOREADY) IVPB 1500 mg/300 mL (has no administration in time range)  HYDROmorphone (DILAUDID) injection 1 mg (1 mg Intravenous Given 10/30/19 0658)  HYDROcodone-acetaminophen (NORCO/VICODIN) 5-325 MG per tablet 1-2 tablet (has no administration in time range)  fentaNYL (SUBLIMAZE) injection 75 mcg (75 mcg Intravenous Given 10/29/19 1223)  ondansetron (ZOFRAN) injection 4 mg (4 mg Intravenous Given 10/29/19 1223)  ceFEPIme (MAXIPIME) 2 g in sodium chloride 0.9 % 100 mL IVPB (0 g Intravenous Stopped 10/29/19 1332)  metroNIDAZOLE (FLAGYL) IVPB 500 mg (0 mg Intravenous Stopped 10/29/19 1406)  vancomycin (VANCOCIN) IVPB 1000 mg/200 mL premix (0 mg Intravenous Stopped 10/29/19 1445)  iohexol (OMNIPAQUE) 350 MG/ML injection 100 mL (100 mLs Intravenous Contrast Given 10/29/19 1442)  HYDROmorphone (DILAUDID) injection 1 mg (1 mg Intravenous Given 10/29/19 1343)  ondansetron (ZOFRAN) injection 4 mg (4 mg Intravenous Given 10/29/19 1342)  vancomycin (VANCOCIN) IVPB 1000 mg/200 mL premix (0 mg Intravenous Stopped 10/29/19 2136)    Mobility walks Low fall risk   Focused Assessments    R Recommendations:  See Admitting Provider Note  Report given to:

## 2019-10-30 NOTE — Consult Note (Signed)
Pharmacy Antibiotic Note  Yesenia Carrillo is a 24 y.o. female admitted on 10/29/2019 with pneumonia.  Pharmacy has been consulted for Vancomycin and Cefepime dosing. She is noted to have had multiple recent admissions for ARDS  Plan: 1) adjust vancomycin dose to 1000 mg IV Q 12 hrs Goal AUC 400-550 Expected AUC: 482 Expected Css: 30.2/13.5 mcg/mL T 1/2: 9.4h SCr used: 0.8 (actual 0.66)  2) continue cefepime 2g IV Q8 hours   Height: 5\' 1"  (154.9 cm) Weight: 113.4 kg (250 lb) IBW/kg (Calculated) : 47.8  Temp (24hrs), Avg:99.3 F (37.4 C), Min:99.3 F (37.4 C), Max:99.3 F (37.4 C)  Recent Labs  Lab 10/29/19 1158 10/29/19 1442 10/29/19 1621 10/30/19 0437  WBC 33.9*  --  34.5* 34.5*  CREATININE 0.56  --  0.66 0.54  LATICACIDVEN 2.4* 1.4  --   --     Estimated Creatinine Clearance: 128.9 mL/min (by C-G formula based on SCr of 0.54 mg/dL).    Allergies  Allergen Reactions  . Ampicillin Rash and Other (See Comments)    Patient and family cannot remember the specifics of the reaction.  Marland Kitchen Penicillins Hives    Antimicrobials this admission: Vancomycin 5/26 >>  Cefepime 5/26 >>   Microbiology results: 5/26 BCx: pending 5/27 MRSA PCR: negative 5/26 SARS CoV-2: negative  Thank you for allowing pharmacy to be a part of this patient's care.  Dallie Piles 10/30/2019 9:43 AM

## 2019-10-30 NOTE — Consult Note (Signed)
[place] for Heparin Indication: Splenic Vein thrombosis  Allergies  Allergen Reactions  . Ampicillin Rash and Other (See Comments)    Patient and family cannot remember the specifics of the reaction.  Marland Kitchen Penicillins Hives    Patient Measurements: Height: 5\' 1"  (154.9 cm) Weight: 113.4 kg (250 lb) IBW/kg (Calculated) : 47.8 Heparin Dosing Weight: 75.8 kg   Vital Signs: Temp: 98.4 F (36.9 C) (05/27 1939) Temp Source: Axillary (05/27 1939) BP: 137/94 (05/27 1939) Pulse Rate: 132 (05/27 1939)  Labs: Recent Labs    10/29/19 1158 10/29/19 1158 10/29/19 1621 10/30/19 0437  HGB 12.1   < > 11.2* 11.3*  HCT 37.1  --  33.9* 34.8*  PLT 482*  --  430* 442*  APTT 37*  --   --   --   LABPROT 12.9  --   --   --   INR 1.0  --   --   --   CREATININE 0.56  --  0.66 0.54  TROPONINIHS 38*  --  32*  --    < > = values in this interval not displayed.    Estimated Creatinine Clearance: 128.9 mL/min (by C-G formula based on SCr of 0.54 mg/dL).   Medical History: Past Medical History:  Diagnosis Date  . COVID-19 04/2019   patient reports diagnosis in Nov 2020  . Hypertension   . Morbid obesity with BMI of 40.0-44.9, adult (HCC)     Medications:  No medications prior to admission.   Scheduled:  . budesonide (PULMICORT) nebulizer solution  0.25 mg Nebulization BID  . Chlorhexidine Gluconate Cloth  6 each Topical Daily  . heparin  5,000 Units Subcutaneous Q8H  . ipratropium-albuterol  3 mL Nebulization Q4H  . methylPREDNISolone (SOLU-MEDROL) injection  40 mg Intravenous Q12H  . sodium chloride flush  3 mL Intravenous Q12H   Infusions:  . sodium chloride 10 mL/hr at 10/30/19 1400  . ceFEPime (MAXIPIME) IV 2 g (10/30/19 1427)  . famotidine (PEPCID) IV Stopped (10/30/19 0932)  . vancomycin     PRN: sodium chloride, acetaminophen, docusate sodium, guaiFENesin-codeine, HYDROcodone-acetaminophen, HYDROmorphone (DILAUDID) injection,  ketorolac, ondansetron (ZOFRAN) IV, polyethylene glycol, sodium chloride flush Anti-infectives (From admission, onward)   Start     Dose/Rate Route Frequency Ordered Stop   10/30/19 2200  vancomycin (VANCOCIN) IVPB 1000 mg/200 mL premix     1,000 mg 200 mL/hr over 60 Minutes Intravenous Every 12 hours 10/30/19 1456     10/30/19 1000  vancomycin (VANCOREADY) IVPB 1500 mg/300 mL  Status:  Discontinued     1,500 mg 150 mL/hr over 120 Minutes Intravenous Every 12 hours 10/29/19 2034 10/30/19 1456   10/29/19 2200  ceFEPIme (MAXIPIME) 2 g in sodium chloride 0.9 % 100 mL IVPB     2 g 200 mL/hr over 30 Minutes Intravenous Every 8 hours 10/29/19 2000     10/29/19 2000  vancomycin (VANCOCIN) IVPB 1000 mg/200 mL premix     1,000 mg 200 mL/hr over 60 Minutes Intravenous  Once 10/29/19 1958 10/29/19 2136   10/29/19 1245  ceFEPIme (MAXIPIME) 2 g in sodium chloride 0.9 % 100 mL IVPB     2 g 200 mL/hr over 30 Minutes Intravenous  Once 10/29/19 1243 10/29/19 1332   10/29/19 1245  metroNIDAZOLE (FLAGYL) IVPB 500 mg     500 mg 100 mL/hr over 60 Minutes Intravenous  Once 10/29/19 1243 10/29/19 1406   10/29/19 1245  vancomycin (VANCOCIN) IVPB 1000 mg/200 mL premix  1,000 mg 200 mL/hr over 60 Minutes Intravenous  Once 10/29/19 1243 10/29/19 1445      Assessment: 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 No DOAC noted PTA.    Goal of Therapy:  Heparin level 0.3-0.7 units/ml Monitor platelets by anticoagulation protocol: Yes   Plan:  Give 4500 units bolus x 1 Start heparin infusion at 1200 units/hr Check anti-Xa level in 6 hours and daily while on heparin Continue to monitor H&H and platelets  Oswald Hillock, PharmD, BCPS 10/30/2019,8:41 PM

## 2019-10-30 NOTE — Progress Notes (Signed)
Pt remains on NRB and High Flow Nasal Cannula, O2 sat is in low 90s, pt breathing unlabored but shallow; had been talking on her cell phone intermittently.  Pt reports increased pain in the LUQ with deep inspiration and cough.  MD ordered cough medication and Toradol.  Pt has remained tachycardic 120s to 130s through shift, MD aware.  To be treated with pain control.  Otherwise pt resting in bed, watching TV, family at bedside.

## 2019-10-30 NOTE — Progress Notes (Signed)
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  General Surgery recommended discussing with Vascular Surgery to see if anticoagulation indicated. I spoke with Dr. Delana Meyer of Vascular Surgery who indicates that anticoagulation is appropriate, but no Vascular intervention is indicated.  Spoke with Dr. Mortimer Fries, will place pt on Heparin drip.      Darel Hong, AGACNP-BC [facility] Pager: [phone]

## 2019-10-30 NOTE — Progress Notes (Signed)
SURGICAL CONSULTATION NOTE   HISTORY OF PRESENT ILLNESS (HPI):  24 y.o. female presented to [facility] ED for evaluation of left-sided abdominal pain. Patient reports pain started 2 days ago.  Pain mostly on the left abdomen and something radiates to her back.  At the ED she had labs that shows leukocytosis.  She had CT of the chest abdominal pelvis.  Chest CT shows severe bilateral pulmonary infiltrates consistent with ARDS.  Also concerning of pneumonia.  CT scan of the abdomen shows splenomegaly with significant area of high flow enhancement.  This is concerning for ischemia of the spleen.  I personally evaluated the images of the CT scan of the chest abdominal pelvis and I discussed personally with the radiologist.  Surgery was consulted for evaluation of abdominal pain.  Patient currently admitted with ARDS and pneumonia with high flow nasal cannula.  Surgery is consulted by Dr. Mortimer Fries in this context for evaluation and management of abdominal pain.  PAST MEDICAL HISTORY (PMH):  Past Medical History:  Diagnosis Date  . COVID-19 04/2019   patient reports diagnosis in Nov 2020  . Hypertension   . Morbid obesity with BMI of 40.0-44.9, adult ([place])      PAST SURGICAL HISTORY ([place]):  Past Surgical History:  Procedure Laterality Date  . TONSILLECTOMY    . WISDOM TOOTH EXTRACTION       MEDICATIONS:  Prior to Admission medications   Not on File     ALLERGIES:  Allergies  Allergen Reactions  . Ampicillin Rash and Other (See Comments)    Patient and family cannot remember the specifics of the reaction.  Marland Kitchen Penicillins Hives     SOCIAL HISTORY:  Social History   Socioeconomic History  . Marital status: Single    Spouse name: Not on file  . Number of children: Not on file  . Years of education: Not on file  . Highest education level: Not on file  Occupational History  . Not on file  Tobacco Use  . Smoking status: Current Every Day Smoker    Types: Cigars  . Smokeless tobacco:  Never Used  Substance and Sexual Activity  . Alcohol use: Yes    Comment: monthly  . Drug use: No  . Sexual activity: Yes    Birth control/protection: None  Other Topics Concern  . Not on file  Social History Narrative  . Not on file   Social Determinants of Health   Financial Resource Strain:   . Difficulty of Paying Living Expenses:   Food Insecurity:   . Worried About Charity fundraiser in the Last Year:   . Arboriculturist in the Last Year:   Transportation Needs:   . Film/video editor (Medical):   Marland Kitchen Lack of Transportation (Non-Medical):   Physical Activity:   . Days of Exercise per Week:   . Minutes of Exercise per Session:   Stress:   . Feeling of Stress :   Social Connections:   . Frequency of Communication with Friends and Family:   . Frequency of Social Gatherings with Friends and Family:   . Attends Religious Services:   . Active Member of Clubs or Organizations:   . Attends Archivist Meetings:   Marland Kitchen Marital Status:   Intimate Partner Violence:   . Fear of Current or Ex-Partner:   . Emotionally Abused:   Marland Kitchen Physically Abused:   . Sexually Abused:       FAMILY HISTORY:  No family history on  file.   REVIEW OF SYSTEMS:  Constitutional: denies weight loss, fever, chills, or sweats  Eyes: denies any other vision changes, history of eye injury  ENT: denies sore throat, hearing problems  Respiratory: Positive shortness of breath, wheezing  Cardiovascular: denies chest pain, palpitations  Gastrointestinal: Positive abdominal pain Genitourinary: denies burning with urination or urinary frequency Musculoskeletal: denies any other joint pains or cramps  Skin: denies any other rashes or skin discolorations  Neurological: denies any other headache, dizziness, weakness  Psychiatric: denies any other depression, anxiety   All other review of systems were negative   VITAL SIGNS:  Temp:  [98.3 F (36.8 C)-98.7 F (37.1 C)] 98.7 F (37.1 C) (05/27  1600) Pulse Rate:  [59-134] 129 (05/27 1800) Resp:  [15-41] 37 (05/27 1800) BP: (131-168)/(91-125) 145/117 (05/27 1800) SpO2:  [72 %-99 %] 90 % (05/27 1800) FiO2 (%):  [80 %-95 %] 90 % (05/27 1545)     Height: 5\' 1"  (154.9 cm) Weight: 113.4 kg BMI (Calculated): 47.26   INTAKE/OUTPUT:  This shift: Total I/O In: 445.1 [I.V.:45.1; IV Piggyback:400] Out: 500 [Urine:500]  Last 2 shifts: @IOLAST2SHIFTS @   PHYSICAL EXAM:  Constitutional:  --Morbidly obese -- Awake, alert, and oriented x3  Eyes:  -- Pupils equally round and reactive to light  -- No scleral icterus  Ear, nose, and throat:  -- No jugular venous distension  Pulmonary:  --Bilateral rhonchi Cardiovascular:  -- S1, S2 present  -- No pericardial rubs Gastrointestinal:  -- Abdomen soft, nontender, non-distended, no guarding or rebound tenderness -- No abdominal masses appreciated, pulsatile or otherwise  Musculoskeletal and Integumentary:  -- Wounds: None appreciated -- Extremities: B/L UE and LE FROM, hands and feet warm, no edema  Neurologic:  -- Motor function: intact and symmetric -- Sensation: intact and symmetric   Labs:  CBC Latest Ref Rng & Units 10/30/2019 10/29/2019 10/29/2019  WBC 4.0 - 10.5 K/uL 34.5(H) 34.5(H) 33.9(H)  Hemoglobin 12.0 - 15.0 g/dL 11.3(L) 11.2(L) 12.1  Hematocrit 36.0 - 46.0 % 34.8(L) 33.9(L) 37.1  Platelets 150 - 400 K/uL 442(H) 430(H) 482(H)   CMP Latest Ref Rng & Units 10/30/2019 10/29/2019 10/29/2019  Glucose 70 - 99 mg/dL 128(H) - 151(H)  BUN 6 - 20 mg/dL 8 - 8  Creatinine 0.44 - 1.00 mg/dL 0.54 0.66 0.56  Sodium 135 - 145 mmol/L 136 - 136  Potassium 3.5 - 5.1 mmol/L 4.2 - 3.4(L)  Chloride 98 - 111 mmol/L 104 - 103  CO2 22 - 32 mmol/L 25 - 23  Calcium 8.9 - 10.3 mg/dL 8.6(L) - 9.3  Total Protein 6.5 - 8.1 g/dL 7.9 - 8.5(H)  Total Bilirubin 0.3 - 1.2 mg/dL 0.7 - 1.4(H)  Alkaline Phos 38 - 126 U/L 53 - 53  AST 15 - 41 U/L 19 - 25  ALT 0 - 44 U/L 15 - 18     Imaging studies:   EXAM: CT ANGIOGRAPHY CHEST  CT ABDOMEN AND PELVIS WITH CONTRAST  TECHNIQUE: Multidetector CT imaging of the chest was performed using the standard protocol during bolus administration of intravenous contrast. Multiplanar CT image reconstructions and MIPs were obtained to evaluate the vascular anatomy. Multidetector CT imaging of the abdomen and pelvis was performed using the standard protocol during bolus administration of intravenous contrast.  CONTRAST:  131mL OMNIPAQUE IOHEXOL 350 MG/ML SOLN  COMPARISON:  CTA chest 11/05/2018  FINDINGS: CTA CHEST FINDINGS  Cardiovascular: Heart is enlarged. Trace fluid pericardial recess. Main pulmonary artery is dilated measuring 3.5 cm. No filling defects  identified within the pulmonary arterial system to suggest acute pulmonary embolus.  Mediastinum/Nodes: No axillary lymphadenopathy. Extensive mediastinal adenopathy is demonstrated. There is a 2.3 cm right paratracheal node (image 26; series 2), previously 1.6 cm. There is a 1.7 cm subcarinal node (image 41; series 2), previously 1.3 cm. Grossly unremarkable esophagus.  Lungs/Pleura: Central airways are patent. Bilateral patchy areas of consolidation predominately involving the lower lobes. Additionally, there is scattered ground-glass opacities bilaterally. No pleural effusion or pneumothorax.  Musculoskeletal: No aggressive or acute appearing osseous lesions.  Review of the MIP images confirms the above findings.  CT ABDOMEN and PELVIS FINDINGS  Hepatobiliary: The liver is mildly enlarged. Gallbladder is unremarkable. No intrahepatic or extrahepatic biliary ductal dilatation.  Pancreas: Unremarkable  Spleen: Spleen is enlarged measuring up to 15 cm. There is geographic increased attenuation involving the cranial and caudal aspects of the spleen.  Adrenals/Urinary Tract: Normal adrenal glands. Kidneys enhance symmetrically with contrast. No hydronephrosis.  Urinary bladder is unremarkable.  Stomach/Bowel: Normal morphology of the stomach. No abnormal bowel wall thickening or evidence for bowel obstruction. No free fluid or free intraperitoneal air.  Vascular/Lymphatic: Normal caliber abdominal aorta. No retroperitoneal lymphadenopathy.  Reproductive: Uterus is unremarkable. Adnexal structures unremarkable.  Other: Postsurgical changes left inguinal region.  Musculoskeletal: No aggressive or acute appearing osseous lesions.  Review of the MIP images confirms the above findings.  IMPRESSION: 1. No evidence for acute pulmonary embolus. 2. Bilateral patchy areas of consolidation predominately involving the lower lobes with scattered ground-glass opacities. Findings concerning for multifocal pneumonia. 3. Extensive mediastinal adenopathy which may be reactive in etiology. 4. Splenomegaly with geographic areas of high attenuation within the cranial and caudal aspects of the spleen. These may be secondary to geographic perfusion. Given the enlarged size of the spleen and these findings, recommend follow-up CT of the abdomen in 3 months to reassess.  Electronically Signed: By: Lovey Newcomer M.D. On: 10/29/2019 15:57  ADDENDUM REPORT: 10/30/2019 10:45  ADDENDUM: I was asked to review this case with Dr. Peyton Najjar. The perfusional changes in the spleen were discussed specifically. The the spleen in general with the exception of 2 peripheral areas is hypodense relative to the liver. These 2 peripheral areas are wedge-shaped. Small vessels can be followed to these areas. On coronal images there is a suggestion of filling defect within the splenic vein best seen on images 55 through 65 extending towards the splenic hilum.  The possibility of more diffuse infarct due to splenic venous thrombosis with areas of preserved venous outflow was discussed with Dr. Peyton Najjar. A short interval follow-up examination was suggested with CT  angiography to include venous phase.  I communicated this information via telephone at approximately 1040 hours on 10/30/2019.   Electronically Signed   By: Zetta Bills M.D.   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  Patient with left-sided abdominal pain.  This abdominal pain can be explained by the CT scan finding of splenic infarct.  Upon arrival to the ED patient with leukocytosis over 30,000 white blood cell count.  The reason for the leukocytosis it is unknown if it is due to infection, steroid use which she was not present at the moment of arrival to the ED or reactive due to the splenic infarct.  At the moment of my evaluation the pain was improving.  I do not see any indication at this moment for  splenectomy.  The fact that patient had thrombosis of the splenic vein should be concern of any coagulopathy.  I recommend to discuss with hematology or vascular surgery about further recommendation to see if the patient needs anticoagulation.  At this moment patient will continue critical care management by ICU physician.  I discussed the case with Dr. Mortimer Fries.   Arnold Long, MD

## 2019-10-30 NOTE — Progress Notes (Signed)
NP Dewaine Conger aware that ABG was attempted by RT X2 without success.

## 2019-10-30 NOTE — Progress Notes (Signed)
Pt O2 sat began to drop to high 70s, notified RT and MD, repositioned patient, instructed on deep breathing exercises.  O2 sat slowly climbed, orders put in for lasix.  Will continue to monitor.

## 2019-10-30 NOTE — Progress Notes (Signed)
CRITICAL CARE NOTE  24 yo morbidly obese AAF with previous h/o COVID 43 Patient comes via EMS. EMS reportslower abdominal pain that radiates to her back. Pt states this started yesterday. Pt states tenderness to belly.  Patient denies any N/V. Pt states dizziness and feels like she is going to pass out. Patient appears in distress and diaphoretic.Patient states SOB increased WOB  MULTIPLE ADMISSIONS FOR ARDS   CC  follow up respiratory failure  SUBJECTIVE Patient remains critically ill Prognosis is guarded Alert and awake Severe hypoxia High risk for intubation   BP (!) 138/98   Pulse (!) 114   Temp 99.3 F (37.4 C) (Oral)   Resp (!) 30   Ht 5\' 1"  (1.549 m)   Wt 113.4 kg   SpO2 93%   BMI 47.24 kg/m    No intake/output data recorded. No intake/output data recorded.  SpO2: 93 % O2 Flow Rate (L/min): 45 L/min FiO2 (%): 90 %  Estimated body mass index is 47.24 kg/m as calculated from the following:   Height as of this encounter: 5\' 1"  (1.549 m).   Weight as of this encounter: 113.4 kg.   Review of Systems:  Gen:  Denies  fever, sweats, chills weight loss  HEENT: Denies blurred vision, double vision, ear pain, eye pain, hearing loss, nose bleeds, sore throat Cardiac:  +heaviness ABD +pain Resp:  +SOB Other:  All other systems negative       PHYSICAL EXAMINATION:  GENERAL:critically ill appearing, +resp distress HEAD: Normocephalic, atraumatic.  EYES: Pupils equal, round, reactive to light.  No scleral icterus.  MOUTH: Moist mucosal membrane. NECK: Supple.  PULMONARY: +rhonchi, +wheezing CARDIOVASCULAR: S1 and S2. Regular rate and rhythm. No murmurs, rubs, or gallops.  GASTROINTESTINAL: +tender, +distended.  Positive bowel sounds.   MUSCULOSKELETAL: No swelling, clubbing, or edema.  NEUROLOGIC: alert and awake SKIN:intact,warm,dry  MEDICATIONS: I have reviewed all medications and confirmed regimen as documented   CULTURE RESULTS   Recent  Results (from the past 240 hour(s))  Blood culture (routine x 2)     Status: None (Preliminary result)   Collection Time: 10/29/19 12:39 PM   Specimen: BLOOD  Result Value Ref Range Status   Specimen Description BLOOD LEFT ANTECUBITAL  Final   Special Requests   Final    BOTTLES DRAWN AEROBIC AND ANAEROBIC Blood Culture adequate volume   Culture   Final    NO GROWTH < 24 HOURS Performed at [facility], [address] [place], [place] [address]    Report Status PENDING  Incomplete  SARS Coronavirus 2 by RT PCR (hospital order, performed in [place] hospital lab) Nasopharyngeal Nasopharyngeal Swab     Status: None   Collection Time: 10/29/19 12:39 PM   Specimen: Nasopharyngeal Swab  Result Value Ref Range Status   SARS Coronavirus 2 NEGATIVE NEGATIVE Final    Comment: (NOTE) SARS-CoV-2 target nucleic acids are NOT DETECTED. The SARS-CoV-2 RNA is generally detectable in upper and lower respiratory specimens during the acute phase of infection. The lowest concentration of SARS-CoV-2 viral copies this assay can detect is 250 copies / mL. A negative result does not preclude SARS-CoV-2 infection and should not be used as the sole basis for treatment or other patient management decisions.  A negative result may occur with improper specimen collection / handling, submission of specimen other than nasopharyngeal swab, presence of viral mutation(s) within the areas targeted by this assay, and inadequate number of viral copies (<250 copies / mL). A negative result  must be combined with clinical observations, patient history, and epidemiological information. Fact Sheet for Patients:   StrictlyIdeas.no Fact Sheet for Healthcare Providers: BankingDealers.co.za This test is not yet approved or cleared  by the [place] FDA and has been authorized for detection and/or diagnosis of SARS-CoV-2 by FDA under an Emergency Use  Authorization (EUA).  This EUA will remain in effect (meaning this test can be used) for the duration of the COVID-19 declaration under Section 564(b)(1) of the Act, 21 U.S.C. section 360bbb-3(b)(1), unless the authorization is terminated or revoked sooner. Performed at [facility], [place], [place] [address]           IMAGING    CT Angio Chest PE W and/or Wo Contrast  Result Date: 10/29/2019 CLINICAL DATA:  Patient with abdominal distension. Lower abdominal pain. Shortness of breath. EXAM: CT ANGIOGRAPHY CHEST CT ABDOMEN AND PELVIS WITH CONTRAST TECHNIQUE: Multidetector CT imaging of the chest was performed using the standard protocol during bolus administration of intravenous contrast. Multiplanar CT image reconstructions and MIPs were obtained to evaluate the vascular anatomy. Multidetector CT imaging of the abdomen and pelvis was performed using the standard protocol during bolus administration of intravenous contrast. CONTRAST:  159mL OMNIPAQUE IOHEXOL 350 MG/ML SOLN COMPARISON:  CTA chest 11/05/2018 FINDINGS: CTA CHEST FINDINGS Cardiovascular: Heart is enlarged. Trace fluid pericardial recess. Main pulmonary artery is dilated measuring 3.5 cm. No filling defects identified within the pulmonary arterial system to suggest acute pulmonary embolus. Mediastinum/Nodes: No axillary lymphadenopathy. Extensive mediastinal adenopathy is demonstrated. There is a 2.3 cm right paratracheal node (image 26; series 2), previously 1.6 cm. There is a 1.7 cm subcarinal node (image 41; series 2), previously 1.3 cm. Grossly unremarkable esophagus. Lungs/Pleura: Central airways are patent. Bilateral patchy areas of consolidation predominately involving the lower lobes. Additionally, there is scattered ground-glass opacities bilaterally. No pleural effusion or pneumothorax. Musculoskeletal: No aggressive or acute appearing osseous lesions. Review of the MIP images confirms the above  findings. CT ABDOMEN and PELVIS FINDINGS Hepatobiliary: The liver is mildly enlarged. Gallbladder is unremarkable. No intrahepatic or extrahepatic biliary ductal dilatation. Pancreas: Unremarkable Spleen: Spleen is enlarged measuring up to 15 cm. There is geographic increased attenuation involving the cranial and caudal aspects of the spleen. Adrenals/Urinary Tract: Normal adrenal glands. Kidneys enhance symmetrically with contrast. No hydronephrosis. Urinary bladder is unremarkable. Stomach/Bowel: Normal morphology of the stomach. No abnormal bowel wall thickening or evidence for bowel obstruction. No free fluid or free intraperitoneal air. Vascular/Lymphatic: Normal caliber abdominal aorta. No retroperitoneal lymphadenopathy. Reproductive: Uterus is unremarkable. Adnexal structures unremarkable. Other: Postsurgical changes left inguinal region. Musculoskeletal: No aggressive or acute appearing osseous lesions. Review of the MIP images confirms the above findings. IMPRESSION: 1. No evidence for acute pulmonary embolus. 2. Bilateral patchy areas of consolidation predominately involving the lower lobes with scattered ground-glass opacities. Findings concerning for multifocal pneumonia. 3. Extensive mediastinal adenopathy which may be reactive in etiology. 4. Splenomegaly with geographic areas of high attenuation within the cranial and caudal aspects of the spleen. These may be secondary to geographic perfusion. Given the enlarged size of the spleen and these findings, recommend follow-up CT of the abdomen in 3 months to reassess. Electronically Signed   By: Lovey Newcomer M.D.   On: 10/29/2019 15:57   CT ABDOMEN PELVIS W CONTRAST  Result Date: 10/29/2019 CLINICAL DATA:  Patient with abdominal distension. Lower abdominal pain. Shortness of breath. EXAM: CT ANGIOGRAPHY CHEST CT ABDOMEN AND PELVIS WITH CONTRAST TECHNIQUE: Multidetector CT imaging of  the chest was performed using the standard protocol during bolus  administration of intravenous contrast. Multiplanar CT image reconstructions and MIPs were obtained to evaluate the vascular anatomy. Multidetector CT imaging of the abdomen and pelvis was performed using the standard protocol during bolus administration of intravenous contrast. CONTRAST:  135mL OMNIPAQUE IOHEXOL 350 MG/ML SOLN COMPARISON:  CTA chest 11/05/2018 FINDINGS: CTA CHEST FINDINGS Cardiovascular: Heart is enlarged. Trace fluid pericardial recess. Main pulmonary artery is dilated measuring 3.5 cm. No filling defects identified within the pulmonary arterial system to suggest acute pulmonary embolus. Mediastinum/Nodes: No axillary lymphadenopathy. Extensive mediastinal adenopathy is demonstrated. There is a 2.3 cm right paratracheal node (image 26; series 2), previously 1.6 cm. There is a 1.7 cm subcarinal node (image 41; series 2), previously 1.3 cm. Grossly unremarkable esophagus. Lungs/Pleura: Central airways are patent. Bilateral patchy areas of consolidation predominately involving the lower lobes. Additionally, there is scattered ground-glass opacities bilaterally. No pleural effusion or pneumothorax. Musculoskeletal: No aggressive or acute appearing osseous lesions. Review of the MIP images confirms the above findings. CT ABDOMEN and PELVIS FINDINGS Hepatobiliary: The liver is mildly enlarged. Gallbladder is unremarkable. No intrahepatic or extrahepatic biliary ductal dilatation. Pancreas: Unremarkable Spleen: Spleen is enlarged measuring up to 15 cm. There is geographic increased attenuation involving the cranial and caudal aspects of the spleen. Adrenals/Urinary Tract: Normal adrenal glands. Kidneys enhance symmetrically with contrast. No hydronephrosis. Urinary bladder is unremarkable. Stomach/Bowel: Normal morphology of the stomach. No abnormal bowel wall thickening or evidence for bowel obstruction. No free fluid or free intraperitoneal air. Vascular/Lymphatic: Normal caliber abdominal aorta. No  retroperitoneal lymphadenopathy. Reproductive: Uterus is unremarkable. Adnexal structures unremarkable. Other: Postsurgical changes left inguinal region. Musculoskeletal: No aggressive or acute appearing osseous lesions. Review of the MIP images confirms the above findings. IMPRESSION: 1. No evidence for acute pulmonary embolus. 2. Bilateral patchy areas of consolidation predominately involving the lower lobes with scattered ground-glass opacities. Findings concerning for multifocal pneumonia. 3. Extensive mediastinal adenopathy which may be reactive in etiology. 4. Splenomegaly with geographic areas of high attenuation within the cranial and caudal aspects of the spleen. These may be secondary to geographic perfusion. Given the enlarged size of the spleen and these findings, recommend follow-up CT of the abdomen in 3 months to reassess. Electronically Signed   By: Lovey Newcomer M.D.   On: 10/29/2019 15:57   DG Chest Port 1 View  Result Date: 10/30/2019 CLINICAL DATA:  Lower abdominal pain radiating to back, abdominal tenderness EXAM: PORTABLE CHEST 1 VIEW COMPARISON:  CT 10/29/2019, radiograph 10/29/2019 FINDINGS: Redemonstration of the dense bilateral airspace opacities with a mid to lower lung predominance. No pneumothorax or visible effusion. Stable cardiomegaly. Widening of the upper mediastinal contours compatible with extensive adenopathy seen on comparison CT. Overall appearance is not significantly changed from prior exam. Telemetry leads overlie the chest. No acute osseous or soft tissue abnormality. IMPRESSION: 1. Dense bilateral airspace opacities with a mid to lower lung predominance, similar to prior exam and concerning for multifocal pneumonia 2. Stable cardiomegaly 3. Mediastinal widening compatible with extensive adenopathy seen on comparison CT. Electronically Signed   By: Lovena Le M.D.   On: 10/30/2019 03:54   DG Chest Portable 1 View  Result Date: 10/29/2019 CLINICAL DATA:  Shortness of  breath EXAM: PORTABLE CHEST 1 VIEW COMPARISON:  September 05, 2019 FINDINGS: There is airspace opacity throughout the right mid and lower lung regions. Lungs elsewhere clear. Heart size and pulmonary vascular normal. No adenopathy. Evidence of old trauma involving the lateral  left clavicle. IMPRESSION: Airspace opacity consistent with pneumonia in right mid and lower lung regions. Lungs elsewhere clear. Cardiac silhouette within normal limits. Electronically Signed   By: Lowella Grip III M.D.   On: 10/29/2019 12:30     Nutrition Status:          ASSESSMENT AND PLAN SYNOPSIS   Severe ACUTE Hypoxic and Hypercapnic Respiratory Failure from pneumonia and ARDS s/p COVID previous h/o ARDS s/p ECMO High risk for intubation High risk for cardiac arrest   ACUTE DIASTOLIC CARDIAC FAILURE-  -oxygen as needed -Lasix as tolerated   Morbid obesity, possible OSA.   Will certainly impact respiratory mechanics,   CARDIAC ICU monitoring  ID -continue IV abx as prescibed -follow up cultures  GI GI PROPHYLAXIS as indicated  NUTRITIONAL STATUS Nutrition Status:         DIET--> as tolerated Constipation protocol as indicated  ENDO - will use ICU hypoglycemic\Hyperglycemia protocol if indicated     ELECTROLYTES -follow labs as needed -replace as needed -pharmacy consultation and following   DVT/GI PRX ordered and assessed TRANSFUSIONS AS NEEDED MONITOR FSBS I Assessed the need for Labs I Assessed the need for Foley I Assessed the need for Central Venous Line Family Discussion when available I Assessed the need for Mobilization I made an Assessment of medications to be adjusted accordingly Safety Risk assessment completed   CASE DISCUSSED IN MULTIDISCIPLINARY ROUNDS WITH ICU TEAM  Critical Care Time devoted to patient care services described in this note is 34 minutes.   Overall, patient is critically ill, prognosis is guarded.   high risk for cardiac arrest and  death.   Transfer to [facility] pending  Corrin Parker, M.D.  [facility]  Medical Director [place] Director [facility] Department

## 2019-10-31 DIAGNOSIS — F17210 Nicotine dependence, cigarettes, uncomplicated: Secondary | ICD-10-CM

## 2019-10-31 DIAGNOSIS — R599 Enlarged lymph nodes, unspecified: Secondary | ICD-10-CM

## 2019-10-31 DIAGNOSIS — J189 Pneumonia, unspecified organism: Secondary | ICD-10-CM

## 2019-10-31 DIAGNOSIS — Z8616 Personal history of COVID-19: Secondary | ICD-10-CM

## 2019-10-31 DIAGNOSIS — D72829 Elevated white blood cell count, unspecified: Secondary | ICD-10-CM

## 2019-10-31 DIAGNOSIS — D751 Secondary polycythemia: Secondary | ICD-10-CM

## 2019-10-31 DIAGNOSIS — I10 Essential (primary) hypertension: Secondary | ICD-10-CM

## 2019-10-31 DIAGNOSIS — I82890 Acute embolism and thrombosis of other specified veins: Secondary | ICD-10-CM

## 2019-10-31 LAB — CBC WITH DIFFERENTIAL/PLATELET
Abs Immature Granulocytes: 0.9 10*3/uL — ABNORMAL HIGH (ref 0.00–0.07)
Basophils Absolute: 0.1 10*3/uL (ref 0.0–0.1)
Basophils Relative: 0 %
Eosinophils Absolute: 0 10*3/uL (ref 0.0–0.5)
Eosinophils Relative: 0 %
HCT: 34 % — ABNORMAL LOW (ref 36.0–46.0)
Hemoglobin: 11 g/dL — ABNORMAL LOW (ref 12.0–15.0)
Immature Granulocytes: 2 %
Lymphocytes Relative: 4 %
Lymphs Abs: 2.2 10*3/uL (ref 0.7–4.0)
MCH: 25.9 pg — ABNORMAL LOW (ref 26.0–34.0)
MCHC: 32.4 g/dL (ref 30.0–36.0)
MCV: 80 fL (ref 80.0–100.0)
Monocytes Absolute: 2 10*3/uL — ABNORMAL HIGH (ref 0.1–1.0)
Monocytes Relative: 4 %
Neutro Abs: 46.9 10*3/uL — ABNORMAL HIGH (ref 1.7–7.7)
Neutrophils Relative %: 90 %
Platelets: 480 10*3/uL — ABNORMAL HIGH (ref 150–400)
RBC: 4.25 MIL/uL (ref 3.87–5.11)
RDW: 16.2 % — ABNORMAL HIGH (ref 11.5–15.5)
Smear Review: NORMAL
WBC: 52 10*3/uL (ref 4.0–10.5)
nRBC: 0 % (ref 0.0–0.2)

## 2019-10-31 LAB — CBC
HCT: 33.5 % — ABNORMAL LOW (ref 36.0–46.0)
Hemoglobin: 11 g/dL — ABNORMAL LOW (ref 12.0–15.0)
MCH: 26.1 pg (ref 26.0–34.0)
MCHC: 32.8 g/dL (ref 30.0–36.0)
MCV: 79.4 fL — ABNORMAL LOW (ref 80.0–100.0)
Platelets: 475 10*3/uL — ABNORMAL HIGH (ref 150–400)
RBC: 4.22 MIL/uL (ref 3.87–5.11)
RDW: 16.3 % — ABNORMAL HIGH (ref 11.5–15.5)
WBC: 51.7 10*3/uL (ref 4.0–10.5)
nRBC: 0 % (ref 0.0–0.2)

## 2019-10-31 LAB — HEPARIN LEVEL (UNFRACTIONATED)
Heparin Unfractionated: 0.18 IU/mL — ABNORMAL LOW (ref 0.30–0.70)
Heparin Unfractionated: 0.16 IU/mL — ABNORMAL LOW (ref 0.30–0.70)
Heparin Unfractionated: 0.21 IU/mL — ABNORMAL LOW (ref 0.30–0.70)

## 2019-10-31 LAB — RENAL FUNCTION PANEL
Albumin: 3.7 g/dL (ref 3.5–5.0)
Anion gap: 9 (ref 5–15)
BUN: 11 mg/dL (ref 6–20)
CO2: 30 mmol/L (ref 22–32)
Calcium: 9.3 mg/dL (ref 8.9–10.3)
Chloride: 101 mmol/L (ref 98–111)
Creatinine, Ser: 0.63 mg/dL (ref 0.44–1.00)
GFR calc Af Amer: >60 mL/min (ref >60)
GFR calc non Af Amer: >60 mL/min (ref >60)
Glucose, Bld: 120 mg/dL — ABNORMAL HIGH (ref 70–99)
Phosphorus: 3.8 mg/dL (ref 2.5–4.6)
Potassium: 4.3 mmol/L (ref 3.5–5.1)
Sodium: 140 mmol/L (ref 135–145)

## 2019-10-31 LAB — PROCALCITONIN: Procalcitonin: 0.19 ng/mL

## 2019-10-31 LAB — MAGNESIUM: Magnesium: 2.2 mg/dL (ref 1.7–2.4)

## 2019-10-31 LAB — LEGIONELLA PNEUMOPHILA SEROGP 1 UR AG: L. pneumophila Serogp 1 Ur Ag: NEGATIVE

## 2019-10-31 MED ORDER — METRONIDAZOLE IN NACL 5-0.79 MG/ML-% IV SOLN
500.0000 mg | Freq: Three times a day (TID) | INTRAVENOUS | Status: DC
  Administered 2019-10-31 – 2019-11-04 (×15): 500 mg via INTRAVENOUS
  Filled 2019-10-31 (×18): qty 100

## 2019-10-31 MED ORDER — HEPARIN BOLUS VIA INFUSION
2000.0000 [IU] | Freq: Once | INTRAVENOUS | Status: AC
  Administered 2019-10-31: 2000 [IU] via INTRAVENOUS
  Filled 2019-10-31: qty 2000

## 2019-10-31 NOTE — Progress Notes (Signed)
[facility] Hospital Day(s): 2.   Post op day(s):  Marland Kitchen   Interval History: Patient seen and examined, no acute events or new complaints overnight. Patient reports no significant changes. Continue with intermittent pain on left abdomen. Pain radiates to back. No aggravating factors. Alleviating factor is pain medication.   Vital signs in last 24 hours: [min-max] current  Temp:  [98.3 F (36.8 C)-99.2 F (37.3 C)] 99.2 F (37.3 C) (05/28 0800) Pulse Rate:  [113-137] 129 (05/28 1400) Resp:  [16-42] 38 (05/28 1400) BP: (136-156)/(80-117) 149/96 (05/28 1400) SpO2:  [82 %-95 %] 86 % (05/28 1400) FiO2 (%):  [88 %-100 %] 100 % (05/28 1554) Weight:  [115.2 kg] 115.2 kg (05/28 0430)     Height: 5\' 1"  (154.9 cm) Weight: 115.2 kg BMI (Calculated): 48.01   Physical Exam:  Constitutional: alert, cooperative  Respiratory: Bilateral rhonchi Cardiovascular: regular rate and sinus rhythm  Gastrointestinal: soft, mild-tender, and non-distended  Labs:  CBC Latest Ref Rng & Units 10/31/2019 10/31/2019 10/30/2019  WBC 4.0 - 10.5 K/uL 52.0(HH) 51.7(HH) 34.5(H)  Hemoglobin 12.0 - 15.0 g/dL 11.0(L) 11.0(L) 11.3(L)  Hematocrit 36.0 - 46.0 % 34.0(L) 33.5(L) 34.8(L)  Platelets 150 - 400 K/uL 480(H) 475(H) 442(H)   CMP Latest Ref Rng & Units 10/31/2019 10/30/2019 10/29/2019  Glucose 70 - 99 mg/dL 120(H) 128(H) -  BUN 6 - 20 mg/dL 11 8 -  Creatinine 0.44 - 1.00 mg/dL 0.63 0.54 0.66  Sodium 135 - 145 mmol/L 140 136 -  Potassium 3.5 - 5.1 mmol/L 4.3 4.2 -  Chloride 98 - 111 mmol/L 101 104 -  CO2 22 - 32 mmol/L 30 25 -  Calcium 8.9 - 10.3 mg/dL 9.3 8.6(L) -  Total Protein 6.5 - 8.1 g/dL - 7.9 -  Total Bilirubin 0.3 - 1.2 mg/dL - 0.7 -  Alkaline Phos 38 - 126 U/L - 53 -  AST 15 - 41 U/L - 19 -  ALT 0 - 44 U/L - 15 -    Imaging studies: No new pertinent imaging studies   Assessment/Plan:  24 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  Patient without clinical deterioration.  There is no change in her abdominal pain.  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  I also had a long discussion with ICU physician about patient case.  We have evaluated the CT scan of the chest since 2017 and patient has been having multiple abnormal lymph nodes since then.  She also had ARDS 2019 before being diagnosed with COVID-19.  I considered that this splenomegaly is chronic but due to the new finding of venous thrombosis and hypoperfusion this can explain her left-sided abdominal pain and the current treatment will be anticoagulation since patient is not a candidate for any surgical management.  There is no further surgical management of this patient at this moment.  Agree with management by ICU physician.  Patient pending transfer to [facility] as per family request.  Arnold Long, MD

## 2019-10-31 NOTE — Consult Note (Signed)
ANTICOAGULATION CONSULT NOTE  Pharmacy Consult for Heparin Indication: Splenic Vein thrombosis  Patient Measurements: Height: 5\' 1"  (154.9 cm) Weight: 115.2 kg (253 lb 15.5 oz) IBW/kg (Calculated) : 47.8 Heparin Dosing Weight: 75.8 kg   Vital Signs: Temp: 98.5 F (36.9 C) (05/28 0400) Temp Source: Axillary (05/28 0400) BP: 144/93 (05/28 0600) Pulse Rate: 117 (05/28 0600)  Labs: Recent Labs    10/29/19 1158 10/29/19 1158 10/29/19 1621 10/29/19 1621 10/30/19 0437 10/30/19 2106 10/31/19 0305 10/31/19 0306  HGB 12.1   < > 11.2*   < > 11.3*  --  11.0*  11.0*  --   HCT 37.1   < > 33.9*  --  34.8*  --  34.0*  33.5*  --   PLT 482*   < > 430*  --  442*  --  480*  475*  --   APTT 37*  --   --   --   --  34  --   --   LABPROT 12.9  --   --   --   --  13.4  --   --   INR 1.0  --   --   --   --  1.1  --   --   HEPARINUNFRC  --   --   --   --   --   --  0.18*  --   CREATININE 0.56   < > 0.66  --  0.54  --   --  0.63  TROPONINIHS 38*  --  32*  --   --   --   --   --    < > = values in this interval not displayed.    Estimated Creatinine Clearance: 130.3 mL/min (by C-G formula based on SCr of 0.63 mg/dL).   Medical History: Past Medical History:  Diagnosis Date  . COVID-19 04/2019   patient reports diagnosis in Nov 2020  . Hypertension   . Morbid obesity with BMI of 40.0-44.9, adult (HCC)     Medications:  No medications prior to admission.   Scheduled:  . budesonide (PULMICORT) nebulizer solution  0.25 mg Nebulization BID  . Chlorhexidine Gluconate Cloth  6 each Topical Daily  . ipratropium-albuterol  3 mL Nebulization Q4H  . methylPREDNISolone (SOLU-MEDROL) injection  40 mg Intravenous Q12H  . pneumococcal 23 valent vaccine  0.5 mL Intramuscular Tomorrow-1000  . sodium chloride flush  3 mL Intravenous Q12H   Infusions:  . sodium chloride Stopped (10/31/19 0505)  . ceFEPime (MAXIPIME) IV 2 g (10/31/19 0610)  . famotidine (PEPCID) IV Stopped (10/30/19 2158)  .  heparin 1,400 Units/hr (10/31/19 0547)  . metronidazole 100 mL/hr at 10/31/19 0547  . vancomycin Stopped (10/31/19 0052)   PRN: sodium chloride, acetaminophen, docusate sodium, guaiFENesin-codeine, HYDROcodone-acetaminophen, HYDROmorphone (DILAUDID) injection, ketorolac, ondansetron (ZOFRAN) IV, polyethylene glycol, sodium chloride flush Anti-infectives (From admission, onward)   Start     Dose/Rate Route Frequency Ordered Stop   10/31/19 0500  metroNIDAZOLE (FLAGYL) IVPB 500 mg     500 mg 100 mL/hr over 60 Minutes Intravenous Every 8 hours 10/31/19 0421     10/30/19 2200  vancomycin (VANCOCIN) IVPB 1000 mg/200 mL premix     1,000 mg 200 mL/hr over 60 Minutes Intravenous Every 12 hours 10/30/19 1456     10/30/19 1000  vancomycin (VANCOREADY) IVPB 1500 mg/300 mL  Status:  Discontinued     1,500 mg 150 mL/hr over 120 Minutes Intravenous Every 12 hours 10/29/19 2034 10/30/19 1456  10/29/19 2200  ceFEPIme (MAXIPIME) 2 g in sodium chloride 0.9 % 100 mL IVPB     2 g 200 mL/hr over 30 Minutes Intravenous Every 8 hours 10/29/19 2000     10/29/19 2000  vancomycin (VANCOCIN) IVPB 1000 mg/200 mL premix     1,000 mg 200 mL/hr over 60 Minutes Intravenous  Once 10/29/19 1958 10/29/19 2136   10/29/19 1245  ceFEPIme (MAXIPIME) 2 g in sodium chloride 0.9 % 100 mL IVPB     2 g 200 mL/hr over 30 Minutes Intravenous  Once 10/29/19 1243 10/29/19 1332   10/29/19 1245  metroNIDAZOLE (FLAGYL) IVPB 500 mg     500 mg 100 mL/hr over 60 Minutes Intravenous  Once 10/29/19 1243 10/29/19 1406   10/29/19 1245  vancomycin (VANCOCIN) IVPB 1000 mg/200 mL premix     1,000 mg 200 mL/hr over 60 Minutes Intravenous  Once 10/29/19 1243 10/29/19 1445      Assessment: 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 No DOAC noted PTA. H&H trending down, platelets are elevated at baseline and stable  Heparin Course: 0527 initiation: 4500 unit bolus, then 1200 units/hr 0528 0305 HL 0.18: 2000 unit bolus,  then inc to 1400 units/hr 0528 1126 HL 0.16: 2000 unit bolus, then inc to 1700 units/hr   Goal of Therapy:  Heparin level 0.3-0.7 units/ml Monitor platelets by anticoagulation protocol: Yes   Plan:   Heparin level subtherapeutic: rebolus 2000 units x 1 and increase heparin infusion to 1700 units/hr  Re-Check anti-Xa level in 6 hours after rate change  Continue to monitor H&H and platelets  Dallie Piles, PharmD 10/31/2019,7:09 AM

## 2019-10-31 NOTE — Progress Notes (Signed)
Yesenia Carrillo visited pt.'s rm. while rounding on ICU; pt.'s grandmother Yesenia Carrillo in chair @ bedside.  Grandmthr. talked at length about her [place] faith and love for the Bible; she has been praying for pt. and reading Scripture to her.  Grandmthr. has suffered numerous losses in the last ten years, and shared several stories in which loved ones requested to see her specifically just before dying.  MD arrived to consult w/family/pt. and [place] excused himself.  Will make referral for potential follow-up.    10/31/19 1500  Clinical Encounter Type  Visited With Family  Visit Type Initial;Psychological support;Social support;Spiritual support;Critical Care  Referral From Chaplain  Spiritual Encounters  Spiritual Needs Emotional  Stress Factors  Family Stress Factors Loss of control;Health changes;Major life changes

## 2019-10-31 NOTE — Progress Notes (Signed)
CRITICAL CARE NOTE  24 yo morbidly obese AAF with previous h/o COVID 53 Patient comes via EMS. EMS reportslower abdominal pain that radiates to her back. Pt states this started yesterday. Pt states tenderness to belly.  Patient denies any N/V. Pt states dizziness and feels like she is going to pass out. Patient appears in distress and diaphoretic.Patient states SOB increased WOB  MULTIPLE ADMISSIONS FOR ARDS   CC  follow up respiratory failure  SUBJECTIVE Patient remains critically ill Prognosis is guarded Alert and awake Severe hypoxia High risk for intubation   BP (!) 149/96   Pulse (!) 129   Temp 99.2 F (37.3 C) (Axillary)   Resp (!) 38   Ht 5\' 1"  (1.549 m)   Wt 115.2 kg   SpO2 (!) 86%   BMI 47.99 kg/m    I/O last 3 completed shifts: In: 1516.6 [I.V.:303.2; IV Piggyback:1213.4] Out: 2000 [Urine:2000] Total I/O In: -  Out: 700 [Urine:700]  SpO2: (!) 86 % O2 Flow Rate (L/min): 50 L/min FiO2 (%): 100 %  Estimated body mass index is 47.99 kg/m as calculated from the following:   Height as of this encounter: 5\' 1"  (1.549 m).   Weight as of this encounter: 115.2 kg.   Review of Systems:  Gen:  Denies  fever, sweats, chills weight loss  HEENT: Denies blurred vision, double vision, ear pain, eye pain, hearing loss, nose bleeds, sore throat Cardiac:  +heaviness ABD +pain Resp:  +SOB Other:  All other systems negative       PHYSICAL EXAMINATION:  GENERAL:critically ill appearing, +resp distress HEAD: Normocephalic, atraumatic.  EYES: Pupils equal, round, reactive to light.  No scleral icterus.  MOUTH: Moist mucosal membrane. NECK: Supple.  PULMONARY: +rhonchi, +wheezing CARDIOVASCULAR: S1 and S2. Regular rate and rhythm. No murmurs, rubs, or gallops.  GASTROINTESTINAL: +tender, +distended.  Positive bowel sounds.   MUSCULOSKELETAL: No swelling, clubbing, or edema.  NEUROLOGIC: alert and awake SKIN:intact,warm,dry  MEDICATIONS: I have  reviewed all medications and confirmed regimen as documented   CULTURE RESULTS   Recent Results (from the past 240 hour(s))  Blood culture (routine x 2)     Status: None (Preliminary result)   Collection Time: 10/29/19 12:39 PM   Specimen: BLOOD  Result Value Ref Range Status   Specimen Description BLOOD LEFT ANTECUBITAL  Final   Special Requests   Final    BOTTLES DRAWN AEROBIC AND ANAEROBIC Blood Culture adequate volume   Culture   Final    NO GROWTH 2 DAYS Performed at [facility], [address], [place], [place] [address]    Report Status PENDING  Incomplete  SARS Coronavirus 2 by RT PCR (hospital order, performed in [place] hospital lab) Nasopharyngeal Nasopharyngeal Swab     Status: None   Collection Time: 10/29/19 12:39 PM   Specimen: Nasopharyngeal Swab  Result Value Ref Range Status   SARS Coronavirus 2 NEGATIVE NEGATIVE Final    Comment: (NOTE) SARS-CoV-2 target nucleic acids are NOT DETECTED. The SARS-CoV-2 RNA is generally detectable in upper and lower respiratory specimens during the acute phase of infection. The lowest concentration of SARS-CoV-2 viral copies this assay can detect is 250 copies / mL. A negative result does not preclude SARS-CoV-2 infection and should not be used as the sole basis for treatment or other patient management decisions.  A negative result may occur with improper specimen collection / handling, submission of specimen other than nasopharyngeal swab, presence of viral mutation(s) within the areas targeted by this  assay, and inadequate number of viral copies (<250 copies / mL). A negative result must be combined with clinical observations, patient history, and epidemiological information. Fact Sheet for Patients:   StrictlyIdeas.no Fact Sheet for Healthcare Providers: BankingDealers.co.za This test is not yet approved or cleared  by the [place] FDA and has been  authorized for detection and/or diagnosis of SARS-CoV-2 by FDA under an Emergency Use Authorization (EUA).  This EUA will remain in effect (meaning this test can be used) for the duration of the COVID-19 declaration under Section 564(b)(1) of the Act, 21 U.S.C. section 360bbb-3(b)(1), unless the authorization is terminated or revoked sooner. Performed at [facility], [address] [place], [place] [address]   MRSA PCR Screening     Status: None   Collection Time: 10/30/19  8:27 AM   Specimen: Nasal Mucosa; Nasopharyngeal  Result Value Ref Range Status   MRSA by PCR NEGATIVE NEGATIVE Final    Comment:        The GeneXpert MRSA Assay (FDA approved for NASAL specimens only), is one component of a comprehensive MRSA colonization surveillance program. It is not intended to diagnose MRSA infection nor to guide or monitor treatment for MRSA infections. Performed at [facility], [address] [place], [place] [address]   Blood culture (routine x 2)     Status: None (Preliminary result)   Collection Time: 10/30/19  9:05 PM   Specimen: BLOOD LEFT FOREARM  Result Value Ref Range Status   Specimen Description BLOOD LEFT FOREARM  Final   Special Requests   Final    BOTTLES DRAWN AEROBIC AND ANAEROBIC Blood Culture results may not be optimal due to an excessive volume of blood received in culture bottles   Culture   Final    NO GROWTH < 12 HOURS Performed at [facility], [address] [place],  [address]    Report Status PENDING  Incomplete          IMAGING    No results found.   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   Will certainly impact respiratory mechanics,   CARDIAC ICU monitoring  ID -continue IV abx as prescibed -follow up cultures  GI GI PROPHYLAXIS as indicated  NUTRITIONAL STATUS Nutrition Status:         DIET--> as tolerated Constipation protocol as indicated  ENDO - will use ICU hypoglycemic\Hyperglycemia protocol if indicated     ELECTROLYTES -follow labs as needed -replace as needed -pharmacy consultation and following   DVT/GI PRX ordered and assessed TRANSFUSIONS AS NEEDED MONITOR FSBS   CASE DISCUSSED IN MULTIDISCIPLINARY ROUNDS WITH ICU TEAM  Critical Care Time devoted to patient care services described in this note is 34 minutes.   Overall, patient is critically ill, prognosis is guarded.   high risk for cardiac arrest and death.   Transfer to [facility] pending   Ottie Glazier, M.D.  Pulmonary & [place]

## 2019-10-31 NOTE — Consult Note (Signed)
ANTICOAGULATION CONSULT NOTE  Pharmacy Consult for Heparin Indication: Splenic Vein thrombosis  Patient Measurements: Height: 5\' 1"  (154.9 cm) Weight: 115.2 kg (253 lb 15.5 oz) IBW/kg (Calculated) : 47.8 Heparin Dosing Weight: 75.8 kg   Vital Signs: Temp: 98.6 F (37 C) (05/28 2000) Temp Source: Axillary (05/28 2000) BP: 154/106 (05/28 2100) Pulse Rate: 126 (05/28 2000)  Labs: Recent Labs    10/29/19 1158 10/29/19 1158 10/29/19 1621 10/29/19 1621 10/30/19 0437 10/30/19 2106 10/31/19 0305 10/31/19 0306 10/31/19 1126 10/31/19 2141  HGB 12.1   < > 11.2*   < > 11.3*  --  11.0*  11.0*  --   --   --   HCT 37.1   < > 33.9*  --  34.8*  --  34.0*  33.5*  --   --   --   PLT 482*   < > 430*  --  442*  --  480*  475*  --   --   --   APTT 37*  --   --   --   --  34  --   --   --   --   LABPROT 12.9  --   --   --   --  13.4  --   --   --   --   INR 1.0  --   --   --   --  1.1  --   --   --   --   HEPARINUNFRC  --   --   --   --   --   --  0.18*  --  0.16* 0.21*  CREATININE 0.56   < > 0.66  --  0.54  --   --  0.63  --   --   TROPONINIHS 38*  --  32*  --   --   --   --   --   --   --    < > = values in this interval not displayed.    Estimated Creatinine Clearance: 130.3 mL/min (by C-G formula based on SCr of 0.63 mg/dL).   Medical History: Past Medical History:  Diagnosis Date  . COVID-19 04/2019   patient reports diagnosis in Nov 2020  . Hypertension   . Morbid obesity with BMI of 40.0-44.9, adult (HCC)     Medications:  No medications prior to admission.   Scheduled:  . budesonide (PULMICORT) nebulizer solution  0.25 mg Nebulization BID  . Chlorhexidine Gluconate Cloth  6 each Topical Daily  . heparin  2,000 Units Intravenous Once  . ipratropium-albuterol  3 mL Nebulization Q4H  . methylPREDNISolone (SOLU-MEDROL) injection  40 mg Intravenous Q12H  . pneumococcal 23 valent vaccine  0.5 mL Intramuscular Tomorrow-1000  . sodium chloride flush  3 mL Intravenous  Q12H   Infusions:  . sodium chloride Stopped (10/31/19 0611)  . ceFEPime (MAXIPIME) IV 2 g (10/31/19 2059)  . famotidine (PEPCID) IV 20 mg (10/31/19 2141)  . heparin 1,700 Units/hr (10/31/19 2000)  . metronidazole 500 mg (10/31/19 2026)  . vancomycin 1,000 mg (10/31/19 2142)   PRN: sodium chloride, acetaminophen, docusate sodium, guaiFENesin-codeine, HYDROcodone-acetaminophen, HYDROmorphone (DILAUDID) injection, ketorolac, ondansetron (ZOFRAN) IV, polyethylene glycol, sodium chloride flush Anti-infectives (From admission, onward)   Start     Dose/Rate Route Frequency Ordered Stop   10/31/19 0500  metroNIDAZOLE (FLAGYL) IVPB 500 mg     500 mg 100 mL/hr over 60 Minutes Intravenous Every 8 hours 10/31/19 0421     10/30/19 2200  vancomycin (VANCOCIN) IVPB 1000 mg/200 mL premix     1,000 mg 200 mL/hr over 60 Minutes Intravenous Every 12 hours 10/30/19 1456     10/30/19 1000  vancomycin (VANCOREADY) IVPB 1500 mg/300 mL  Status:  Discontinued     1,500 mg 150 mL/hr over 120 Minutes Intravenous Every 12 hours 10/29/19 2034 10/30/19 1456   10/29/19 2200  ceFEPIme (MAXIPIME) 2 g in sodium chloride 0.9 % 100 mL IVPB     2 g 200 mL/hr over 30 Minutes Intravenous Every 8 hours 10/29/19 2000     10/29/19 2000  vancomycin (VANCOCIN) IVPB 1000 mg/200 mL premix     1,000 mg 200 mL/hr over 60 Minutes Intravenous  Once 10/29/19 1958 10/29/19 2136   10/29/19 1245  ceFEPIme (MAXIPIME) 2 g in sodium chloride 0.9 % 100 mL IVPB     2 g 200 mL/hr over 30 Minutes Intravenous  Once 10/29/19 1243 10/29/19 1332   10/29/19 1245  metroNIDAZOLE (FLAGYL) IVPB 500 mg     500 mg 100 mL/hr over 60 Minutes Intravenous  Once 10/29/19 1243 10/29/19 1406   10/29/19 1245  vancomycin (VANCOCIN) IVPB 1000 mg/200 mL premix     1,000 mg 200 mL/hr over 60 Minutes Intravenous  Once 10/29/19 1243 10/29/19 1445      Assessment: 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 No DOAC noted PTA. H&H trending  down, platelets are elevated at baseline and stable  Heparin Course: 0527 initiation: 4500 unit bolus, then 1200 units/hr 0528 0305 HL 0.18: 2000 unit bolus, then inc to 1400 units/hr 0528 1126 HL 0.16: 2000 unit bolus, then inc to 1700 units/hr 0528 2141 HL 0.21: SUBtherapeutic.  2000 unit bolus then inc to 2000 units/hr   Goal of Therapy:  Heparin level 0.3-0.7 units/ml Monitor platelets by anticoagulation protocol: Yes   Plan:   Heparin level subtherapeutic: rebolus 2000 units x 1 and increase heparin infusion to 2000 units/hr  Re-Check anti-Xa level in 6 hours after rate change  Continue to monitor H&H and platelets  Ena Dawley, PharmD 10/31/2019,10:10 PM

## 2019-10-31 NOTE — Consult Note (Signed)
[place] for Heparin Indication: Splenic Vein thrombosis  Allergies  Allergen Reactions  . Ampicillin Rash and Other (See Comments)    Patient and family cannot remember the specifics of the reaction.  Marland Kitchen Penicillins Hives    Patient Measurements: Height: 5\' 1"  (154.9 cm) Weight: 113.4 kg (250 lb) IBW/kg (Calculated) : 47.8 Heparin Dosing Weight: 75.8 kg   Vital Signs: Temp: 98.5 F (36.9 C) (05/28 0400) Temp Source: Axillary (05/28 0400) BP: 143/104 (05/28 0400) Pulse Rate: 114 (05/28 0400)  Labs: Recent Labs    10/29/19 1158 10/29/19 1158 10/29/19 1621 10/29/19 1621 10/30/19 0437 10/30/19 2106 10/31/19 0305 10/31/19 0306  HGB 12.1   < > 11.2*   < > 11.3*  --  11.0*  --   HCT 37.1   < > 33.9*  --  34.8*  --  33.5*  --   PLT 482*   < > 430*  --  442*  --  475*  --   APTT 37*  --   --   --   --  34  --   --   LABPROT 12.9  --   --   --   --  13.4  --   --   INR 1.0  --   --   --   --  1.1  --   --   HEPARINUNFRC  --   --   --   --   --   --  0.18*  --   CREATININE 0.56   < > 0.66  --  0.54  --   --  0.63  TROPONINIHS 38*  --  32*  --   --   --   --   --    < > = values in this interval not displayed.    Estimated Creatinine Clearance: 128.9 mL/min (by C-G formula based on SCr of 0.63 mg/dL).   Medical History: Past Medical History:  Diagnosis Date  . COVID-19 04/2019   patient reports diagnosis in Nov 2020  . Hypertension   . Morbid obesity with BMI of 40.0-44.9, adult (HCC)     Medications:  No medications prior to admission.   Scheduled:  . budesonide (PULMICORT) nebulizer solution  0.25 mg Nebulization BID  . Chlorhexidine Gluconate Cloth  6 each Topical Daily  . ipratropium-albuterol  3 mL Nebulization Q4H  . methylPREDNISolone (SOLU-MEDROL) injection  40 mg Intravenous Q12H  . pneumococcal 23 valent vaccine  0.5 mL Intramuscular Tomorrow-1000  . sodium chloride flush  3 mL Intravenous Q12H   Infusions:   . sodium chloride 10 mL/hr at 10/31/19 0333  . ceFEPime (MAXIPIME) IV Stopped (10/30/19 2334)  . famotidine (PEPCID) IV Stopped (10/30/19 2158)  . heparin 1,200 Units/hr (10/31/19 0333)  . metronidazole    . vancomycin Stopped (10/31/19 0052)   PRN: sodium chloride, acetaminophen, docusate sodium, guaiFENesin-codeine, HYDROcodone-acetaminophen, HYDROmorphone (DILAUDID) injection, ketorolac, ondansetron (ZOFRAN) IV, polyethylene glycol, sodium chloride flush Anti-infectives (From admission, onward)   Start     Dose/Rate Route Frequency Ordered Stop   10/31/19 0500  metroNIDAZOLE (FLAGYL) IVPB 500 mg     500 mg 100 mL/hr over 60 Minutes Intravenous Every 8 hours 10/31/19 0421     10/30/19 2200  vancomycin (VANCOCIN) IVPB 1000 mg/200 mL premix     1,000 mg 200 mL/hr over 60 Minutes Intravenous Every 12 hours 10/30/19 1456     10/30/19 1000  vancomycin (VANCOREADY) IVPB 1500 mg/300 mL  Status:  Discontinued     1,500 mg 150 mL/hr over 120 Minutes Intravenous Every 12 hours 10/29/19 2034 10/30/19 1456   10/29/19 2200  ceFEPIme (MAXIPIME) 2 g in sodium chloride 0.9 % 100 mL IVPB     2 g 200 mL/hr over 30 Minutes Intravenous Every 8 hours 10/29/19 2000     10/29/19 2000  vancomycin (VANCOCIN) IVPB 1000 mg/200 mL premix     1,000 mg 200 mL/hr over 60 Minutes Intravenous  Once 10/29/19 1958 10/29/19 2136   10/29/19 1245  ceFEPIme (MAXIPIME) 2 g in sodium chloride 0.9 % 100 mL IVPB     2 g 200 mL/hr over 30 Minutes Intravenous  Once 10/29/19 1243 10/29/19 1332   10/29/19 1245  metroNIDAZOLE (FLAGYL) IVPB 500 mg     500 mg 100 mL/hr over 60 Minutes Intravenous  Once 10/29/19 1243 10/29/19 1406   10/29/19 1245  vancomycin (VANCOCIN) IVPB 1000 mg/200 mL premix     1,000 mg 200 mL/hr over 60 Minutes Intravenous  Once 10/29/19 1243 10/29/19 1445      Assessment: 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 No DOAC noted PTA.   0528 0305 HL 0.18, SUBtherapeutic, CBC  stable   Goal of Therapy:  Heparin level 0.3-0.7 units/ml Monitor platelets by anticoagulation protocol: Yes   Plan:  Heparin level subtherapeutic, will give rebolus of 2000 units x 1 and increase heparin drip to 1400 units/hr Check anti-Xa level in 6 hours and daily while on heparin Continue to monitor H&H and platelets  Ena Dawley, PharmD 10/31/2019,4:28 AM

## 2019-10-31 NOTE — Consult Note (Signed)
[place]  [phone] [phone]  ID: [name] OB: 09-11-1995  MR#: [id number]  VOJ#:[id number]  Patient Care Team: [facility] [place] as PCP - General (General Practice)  CHIEF COMPLAINT: Leukocytosis, lymphadenopathy.  INTERVAL HISTORY: Patient is a 24 year old female who was initially presented to the emergency room with abdominal pain, but was admitted to the ICU with severe hypoxia, ARDS with multifocal pneumonia and currently on high flow oxygen.  Given the acuity of her condition, review of systems is difficult.  Patient noted to have a significantly elevated white blood cell count with neutrophilic predominance.  Patient also noted to have extensive mediastinal lymphadenopathy.  REVIEW OF SYSTEMS:   Review of Systems  Unable to perform ROS: Acuity of condition    PAST MEDICAL HISTORY: Past Medical History:  Diagnosis Date   COVID-19 04/2019   patient reports diagnosis in Nov 2020   Hypertension    Morbid obesity with BMI of 40.0-44.9, adult ([place])     PAST SURGICAL HISTORY: Past Surgical History:  Procedure Laterality Date   TONSILLECTOMY     WISDOM TOOTH EXTRACTION      FAMILY HISTORY: No family history on file.  ADVANCED DIRECTIVES (Y/N):  _0 @  HEALTH MAINTENANCE: Social History   Tobacco Use   Smoking status: Current Every Day Smoker    Types: Cigars   Smokeless tobacco: Never Used  Substance Use Topics   Alcohol use: Yes    Comment: monthly   Drug use: No     Colonoscopy:  PAP:  Bone density:  Lipid panel:  Allergies  Allergen Reactions   Ampicillin Rash and Other (See Comments)    Patient and family cannot remember the specifics of the reaction.   Penicillins Hives    Current Facility-Administered Medications  Medication Dose Route Frequency Provider Last Rate Last Admin   0.9 %  sodium chloride infusion  250 mL Intravenous PRN Flora Lipps, MD   Stopped at  10/31/19 0505   acetaminophen (TYLENOL) tablet 650 mg  650 mg Oral Q4H PRN Flora Lipps, MD   650 mg at 10/29/19 1700   budesonide (PULMICORT) nebulizer solution 0.25 mg  0.25 mg Nebulization BID Flora Lipps, MD   0.25 mg at 10/31/19 0824   ceFEPIme (MAXIPIME) 2 g in sodium chloride 0.9 % 100 mL IVPB  2 g Intravenous Q8H Nazari, Walid A, RPH 200 mL/hr at 10/31/19 0610 2 g at 10/31/19 0610   Chlorhexidine Gluconate Cloth 2 % PADS 6 each  6 each Topical Daily Flora Lipps, MD   6 each at 10/31/19 0940   docusate sodium (COLACE) capsule 100 mg  100 mg Oral BID PRN Flora Lipps, MD       famotidine (PEPCID) IVPB 20 mg premix  20 mg Intravenous Q12H Flora Lipps, MD 100 mL/hr at 10/31/19 0945 20 mg at 10/31/19 0945   guaiFENesin-codeine 100-10 MG/5ML solution 5 mL  5 mL Oral Q4H PRN Flora Lipps, MD   5 mL at 10/30/19 1733   heparin ADULT infusion 100 units/mL (25000 units/267m sodium chloride 0.45%)  1,700 Units/hr Intravenous Continuous GDallie Piles RPH 17 mL/hr at 10/31/19 1359 1,700 Units/hr at 10/31/19 1359   HYDROcodone-acetaminophen (NORCO/VICODIN) 5-325 MG per tablet 1-2 tablet  1-2 tablet Oral Q4H PRN KDarel HongD, NP       HYDROmorphone (DILAUDID) injection 1 mg  1 mg Intravenous Q2H PRN KDarel HongD, NP   1 mg at 10/31/19 1437   ipratropium-albuterol (DUONEB) 0.5-2.5 (  3) MG/3ML nebulizer solution 3 mL  3 mL Nebulization Q4H Flora Lipps, MD   3 mL at 10/31/19 1118   ketorolac (TORADOL) 15 MG/ML injection 15 mg  15 mg Intravenous Q8H PRN Flora Lipps, MD   15 mg at 10/31/19 1212   methylPREDNISolone sodium succinate (SOLU-MEDROL) 40 mg/mL injection 40 mg  40 mg Intravenous Q12H Darel Hong D, NP   40 mg at 10/31/19 0940   metroNIDAZOLE (FLAGYL) IVPB 500 mg  500 mg Intravenous Q8H Darel Hong D, NP 100 mL/hr at 10/31/19 1358 500 mg at 10/31/19 1358   ondansetron (ZOFRAN) injection 4 mg  4 mg Intravenous Q6H PRN Flora Lipps, MD   4 mg at 10/30/19 0658    pneumococcal 23 valent vaccine (PNEUMOVAX-23) injection 0.5 mL  0.5 mL Intramuscular Tomorrow-1000 Darel Hong D, NP       polyethylene glycol (MIRALAX / GLYCOLAX) packet 17 g  17 g Oral Daily PRN Flora Lipps, MD       sodium chloride flush (NS) 0.9 % injection 3 mL  3 mL Intravenous Q12H Flora Lipps, MD   3 mL at 10/31/19 0941   sodium chloride flush (NS) 0.9 % injection 3 mL  3 mL Intravenous PRN Flora Lipps, MD       vancomycin (VANCOCIN) IVPB 1000 mg/200 mL premix  1,000 mg Intravenous Q12H Dallie Piles, RPH 200 mL/hr at 10/31/19 1134 1,000 mg at 10/31/19 1134    OBJECTIVE: Vitals:   10/31/19 1300 10/31/19 1400  BP: (!) 143/93 (!) 149/96  Pulse: (!) 131 (!) 129  Resp: (!) 35 (!) 38  Temp:    SpO2: (!) 84% (!) 86%     Body mass index is 47.99 kg/m.    ECOG FS:4 - Bedbound  General: Ill-appearing, moderate respiratory distress. Eyes: Pink conjunctiva, anicteric sclera. HEENT: Normocephalic, moist mucous membranes. Lungs: No audible wheezing or coughing. Heart: Regular rate and rhythm. Abdomen: Soft, nontender, no obvious distention. Musculoskeletal: No edema, cyanosis, or clubbing. Neuro: Alert. Cranial nerves grossly intact. Skin: No rashes or petechiae noted. Psych: Flat affect  LAB RESULTS:  Lab Results  Component Value Date   NA 140 10/31/2019   K 4.3 10/31/2019   CL 101 10/31/2019   CO2 30 10/31/2019   GLUCOSE 120 (H) 10/31/2019   BUN 11 10/31/2019   CREATININE 0.63 10/31/2019   CALCIUM 9.3 10/31/2019   PROT 7.9 10/30/2019   ALBUMIN 3.7 10/31/2019   AST 19 10/30/2019   ALT 15 10/30/2019   ALKPHOS 53 10/30/2019   BILITOT 0.7 10/30/2019   GFRNONAA >60 10/31/2019   GFRAA >60 10/31/2019    Lab Results  Component Value Date   WBC 51.7 (HH) 10/31/2019   WBC 52.0 (HH) 10/31/2019   NEUTROABS 46.9 (H) 10/31/2019   HGB 11.0 (L) 10/31/2019   HGB 11.0 (L) 10/31/2019   HCT 33.5 (L) 10/31/2019   HCT 34.0 (L) 10/31/2019   MCV 79.4 (L) 10/31/2019    MCV 80.0 10/31/2019   PLT 475 (H) 10/31/2019   PLT 480 (H) 10/31/2019     STUDIES: CT Angio Chest PE W and/or Wo Contrast  Addendum Date: 10/30/2019   ADDENDUM REPORT: 10/30/2019 10:45 ADDENDUM: I was asked to review this case with Dr. Peyton Najjar. The perfusional changes in the spleen were discussed specifically. The the spleen in general with the exception of 2 peripheral areas is hypodense relative to the liver. These 2 peripheral areas are wedge-shaped. Small vessels can be followed to these areas. On coronal images  there is a suggestion of filling defect within the splenic vein best seen on images 55 through 65 extending towards the splenic hilum. The possibility of more diffuse infarct due to splenic venous thrombosis with areas of preserved venous outflow was discussed with Dr. Peyton Najjar. A short interval follow-up examination was suggested with CT angiography to include venous phase. I communicated this information via telephone at approximately 1040 hours on 10/30/2019. Electronically Signed   By: Zetta Bills M.D.   On: 10/30/2019 10:45   Result Date: 10/30/2019 CLINICAL DATA:  Patient with abdominal distension. Lower abdominal pain. Shortness of breath. EXAM: CT ANGIOGRAPHY CHEST CT ABDOMEN AND PELVIS WITH CONTRAST TECHNIQUE: Multidetector CT imaging of the chest was performed using the standard protocol during bolus administration of intravenous contrast. Multiplanar CT image reconstructions and MIPs were obtained to evaluate the vascular anatomy. Multidetector CT imaging of the abdomen and pelvis was performed using the standard protocol during bolus administration of intravenous contrast. CONTRAST:  165m OMNIPAQUE IOHEXOL 350 MG/ML SOLN COMPARISON:  CTA chest 11/05/2018 FINDINGS: CTA CHEST FINDINGS Cardiovascular: Heart is enlarged. Trace fluid pericardial recess. Main pulmonary artery is dilated measuring 3.5 cm. No filling defects identified within the pulmonary arterial system to suggest  acute pulmonary embolus. Mediastinum/Nodes: No axillary lymphadenopathy. Extensive mediastinal adenopathy is demonstrated. There is a 2.3 cm right paratracheal node (image 26; series 2), previously 1.6 cm. There is a 1.7 cm subcarinal node (image 41; series 2), previously 1.3 cm. Grossly unremarkable esophagus. Lungs/Pleura: Central airways are patent. Bilateral patchy areas of consolidation predominately involving the lower lobes. Additionally, there is scattered ground-glass opacities bilaterally. No pleural effusion or pneumothorax. Musculoskeletal: No aggressive or acute appearing osseous lesions. Review of the MIP images confirms the above findings. CT ABDOMEN and PELVIS FINDINGS Hepatobiliary: The liver is mildly enlarged. Gallbladder is unremarkable. No intrahepatic or extrahepatic biliary ductal dilatation. Pancreas: Unremarkable Spleen: Spleen is enlarged measuring up to 15 cm. There is geographic increased attenuation involving the cranial and caudal aspects of the spleen. Adrenals/Urinary Tract: Normal adrenal glands. Kidneys enhance symmetrically with contrast. No hydronephrosis. Urinary bladder is unremarkable. Stomach/Bowel: Normal morphology of the stomach. No abnormal bowel wall thickening or evidence for bowel obstruction. No free fluid or free intraperitoneal air. Vascular/Lymphatic: Normal caliber abdominal aorta. No retroperitoneal lymphadenopathy. Reproductive: Uterus is unremarkable. Adnexal structures unremarkable. Other: Postsurgical changes left inguinal region. Musculoskeletal: No aggressive or acute appearing osseous lesions. Review of the MIP images confirms the above findings. IMPRESSION: 1. No evidence for acute pulmonary embolus. 2. Bilateral patchy areas of consolidation predominately involving the lower lobes with scattered ground-glass opacities. Findings concerning for multifocal pneumonia. 3. Extensive mediastinal adenopathy which may be reactive in etiology. 4. Splenomegaly with  geographic areas of high attenuation within the cranial and caudal aspects of the spleen. These may be secondary to geographic perfusion. Given the enlarged size of the spleen and these findings, recommend follow-up CT of the abdomen in 3 months to reassess. Electronically Signed: By: DLovey NewcomerM.D. On: 10/29/2019 15:57   CT ABDOMEN PELVIS W CONTRAST  Addendum Date: 10/30/2019   ADDENDUM REPORT: 10/30/2019 10:45 ADDENDUM: I was asked to review this case with Dr. CPeyton Najjar The perfusional changes in the spleen were discussed specifically. The the spleen in general with the exception of 2 peripheral areas is hypodense relative to the liver. These 2 peripheral areas are wedge-shaped. Small vessels can be followed to these areas. On coronal images there is a suggestion of filling defect within the splenic vein best seen  on images 55 through 65 extending towards the splenic hilum. The possibility of more diffuse infarct due to splenic venous thrombosis with areas of preserved venous outflow was discussed with Dr. Peyton Najjar. A short interval follow-up examination was suggested with CT angiography to include venous phase. I communicated this information via telephone at approximately 1040 hours on 10/30/2019. Electronically Signed   By: Zetta Bills M.D.   On: 10/30/2019 10:45   Result Date: 10/30/2019 CLINICAL DATA:  Patient with abdominal distension. Lower abdominal pain. Shortness of breath. EXAM: CT ANGIOGRAPHY CHEST CT ABDOMEN AND PELVIS WITH CONTRAST TECHNIQUE: Multidetector CT imaging of the chest was performed using the standard protocol during bolus administration of intravenous contrast. Multiplanar CT image reconstructions and MIPs were obtained to evaluate the vascular anatomy. Multidetector CT imaging of the abdomen and pelvis was performed using the standard protocol during bolus administration of intravenous contrast. CONTRAST:  168m OMNIPAQUE IOHEXOL 350 MG/ML SOLN COMPARISON:  CTA chest 11/05/2018  FINDINGS: CTA CHEST FINDINGS Cardiovascular: Heart is enlarged. Trace fluid pericardial recess. Main pulmonary artery is dilated measuring 3.5 cm. No filling defects identified within the pulmonary arterial system to suggest acute pulmonary embolus. Mediastinum/Nodes: No axillary lymphadenopathy. Extensive mediastinal adenopathy is demonstrated. There is a 2.3 cm right paratracheal node (image 26; series 2), previously 1.6 cm. There is a 1.7 cm subcarinal node (image 41; series 2), previously 1.3 cm. Grossly unremarkable esophagus. Lungs/Pleura: Central airways are patent. Bilateral patchy areas of consolidation predominately involving the lower lobes. Additionally, there is scattered ground-glass opacities bilaterally. No pleural effusion or pneumothorax. Musculoskeletal: No aggressive or acute appearing osseous lesions. Review of the MIP images confirms the above findings. CT ABDOMEN and PELVIS FINDINGS Hepatobiliary: The liver is mildly enlarged. Gallbladder is unremarkable. No intrahepatic or extrahepatic biliary ductal dilatation. Pancreas: Unremarkable Spleen: Spleen is enlarged measuring up to 15 cm. There is geographic increased attenuation involving the cranial and caudal aspects of the spleen. Adrenals/Urinary Tract: Normal adrenal glands. Kidneys enhance symmetrically with contrast. No hydronephrosis. Urinary bladder is unremarkable. Stomach/Bowel: Normal morphology of the stomach. No abnormal bowel wall thickening or evidence for bowel obstruction. No free fluid or free intraperitoneal air. Vascular/Lymphatic: Normal caliber abdominal aorta. No retroperitoneal lymphadenopathy. Reproductive: Uterus is unremarkable. Adnexal structures unremarkable. Other: Postsurgical changes left inguinal region. Musculoskeletal: No aggressive or acute appearing osseous lesions. Review of the MIP images confirms the above findings. IMPRESSION: 1. No evidence for acute pulmonary embolus. 2. Bilateral patchy areas of  consolidation predominately involving the lower lobes with scattered ground-glass opacities. Findings concerning for multifocal pneumonia. 3. Extensive mediastinal adenopathy which may be reactive in etiology. 4. Splenomegaly with geographic areas of high attenuation within the cranial and caudal aspects of the spleen. These may be secondary to geographic perfusion. Given the enlarged size of the spleen and these findings, recommend follow-up CT of the abdomen in 3 months to reassess. Electronically Signed: By: DLovey NewcomerM.D. On: 10/29/2019 15:57   DG Chest Port 1 View  Result Date: 10/30/2019 CLINICAL DATA:  Lower abdominal pain radiating to back, abdominal tenderness EXAM: PORTABLE CHEST 1 VIEW COMPARISON:  CT 10/29/2019, radiograph 10/29/2019 FINDINGS: Redemonstration of the dense bilateral airspace opacities with a mid to lower lung predominance. No pneumothorax or visible effusion. Stable cardiomegaly. Widening of the upper mediastinal contours compatible with extensive adenopathy seen on comparison CT. Overall appearance is not significantly changed from prior exam. Telemetry leads overlie the chest. No acute osseous or soft tissue abnormality. IMPRESSION: 1. Dense bilateral airspace opacities with a  mid to lower lung predominance, similar to prior exam and concerning for multifocal pneumonia 2. Stable cardiomegaly 3. Mediastinal widening compatible with extensive adenopathy seen on comparison CT. Electronically Signed   By: Lovena Le M.D.   On: 10/30/2019 03:54   DG Chest Portable 1 View  Result Date: 10/29/2019 CLINICAL DATA:  Shortness of breath EXAM: PORTABLE CHEST 1 VIEW COMPARISON:  September 05, 2019 FINDINGS: There is airspace opacity throughout the right mid and lower lung regions. Lungs elsewhere clear. Heart size and pulmonary vascular normal. No adenopathy. Evidence of old trauma involving the lateral left clavicle. IMPRESSION: Airspace opacity consistent with pneumonia in right mid and  lower lung regions. Lungs elsewhere clear. Cardiac silhouette within normal limits. Electronically Signed   By: Lowella Grip III M.D.   On: 10/29/2019 12:30    ASSESSMENT: Leukocytosis, lymphadenopathy.  PLAN:    1.  Leukocytosis: Likely reactive secondary to her underlying multifocal pneumonia and ARDS.  Have ordered peripheral blood flow cytometry for completeness.  Continue to monitor daily CBC.  No further intervention is needed.  Patient does not require bone marrow biopsy. 2.  Mediastinal lymphadenopathy: CT scan results from Oct 29, 2019 reviewed independently and report as above which again likely is reactive in nature given her significant lung disease, ARDS, and multifocal pneumonia.  Patient likely at high risk for decompensation of biopsy is performed.  Recommend repeating CT scan in 3 months after acute issues resolved. 3.  Thrombocytosis: Reactive.  Monitor daily CBC as above. 4.  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 5.  Disposition: By report, family has requested transfer to [facility].  Will continue to follow while patient at [facility].  Appreciate consult.  Lloyd Huger, MD   10/31/2019 2:45 PM

## 2019-10-31 NOTE — Consult Note (Signed)
Pharmacy Antibiotic Note  Yesenia Carrillo is a 24 y.o. female admitted on 10/29/2019 with pneumonia.  Pharmacy has been consulted for Vancomycin and Cefepime dosing. She is noted to have had multiple recent admissions for ARDS. This is day  # 3 of broad-spectrum IV antibiotics with worsening leukocytosis but no recent fevers, renal function is stable and at apparent baseline.  Plan:  1) continue vancomycin dose to 1000 mg IV Q 12 hrs Goal AUC 400-550 Expected AUC: 482 Expected Css: 30.2/13.5 mcg/mL T 1/2: 9.4h SCr used: 0.8 (actual 0.63)  2) continue cefepime 2g IV Q8 hours   Height: 5\' 1"  (154.9 cm) Weight: 115.2 kg (253 lb 15.5 oz) IBW/kg (Calculated) : 47.8  Temp (24hrs), Avg:98.4 F (36.9 C), Min:98.3 F (36.8 C), Max:98.7 F (37.1 C)  Recent Labs  Lab 10/29/19 1158 10/29/19 1442 10/29/19 1621 10/30/19 0437 10/31/19 0305 10/31/19 0306  WBC 33.9*  --  34.5* 34.5* 52.0*  51.7*  --   CREATININE 0.56  --  0.66 0.54  --  0.63  LATICACIDVEN 2.4* 1.4  --   --   --   --     Estimated Creatinine Clearance: 130.3 mL/min (by C-G formula based on SCr of 0.63 mg/dL).    Allergies  Allergen Reactions  . Ampicillin Rash and Other (See Comments)    Patient and family cannot remember the specifics of the reaction.  Marland Kitchen Penicillins Hives    Antimicrobials this admission: Vancomycin 5/26 >>  Cefepime 5/26 >>  Metronidazole 5/26 >>  Microbiology results: 5/26 BCx: pending 5/27 MRSA PCR: negative 5/26 SARS CoV-2: negative  Thank you for allowing pharmacy to be a part of this patient's care.  Dallie Piles 10/31/2019 7:09 AM

## 2019-11-01 LAB — CBC WITH DIFFERENTIAL/PLATELET
Abs Immature Granulocytes: 1.22 10*3/uL — ABNORMAL HIGH (ref 0.00–0.07)
Basophils Absolute: 0 10*3/uL (ref 0.0–0.1)
Basophils Relative: 0 %
Eosinophils Absolute: 0 10*3/uL (ref 0.0–0.5)
Eosinophils Relative: 0 %
HCT: 33.3 % — ABNORMAL LOW (ref 36.0–46.0)
Hemoglobin: 10.8 g/dL — ABNORMAL LOW (ref 12.0–15.0)
Immature Granulocytes: 2 %
Lymphocytes Relative: 5 %
Lymphs Abs: 2.5 10*3/uL (ref 0.7–4.0)
MCH: 25.5 pg — ABNORMAL LOW (ref 26.0–34.0)
MCHC: 32.4 g/dL (ref 30.0–36.0)
MCV: 78.7 fL — ABNORMAL LOW (ref 80.0–100.0)
Monocytes Absolute: 2.8 10*3/uL — ABNORMAL HIGH (ref 0.1–1.0)
Monocytes Relative: 5 %
Neutro Abs: 46.9 10*3/uL — ABNORMAL HIGH (ref 1.7–7.7)
Neutrophils Relative %: 88 %
Platelets: 507 10*3/uL — ABNORMAL HIGH (ref 150–400)
RBC: 4.23 MIL/uL (ref 3.87–5.11)
RDW: 16.2 % — ABNORMAL HIGH (ref 11.5–15.5)
Smear Review: NORMAL
WBC: 53.5 10*3/uL (ref 4.0–10.5)
nRBC: 0 % (ref 0.0–0.2)

## 2019-11-01 LAB — COMPREHENSIVE METABOLIC PANEL
ALT: 10 U/L (ref 0–44)
AST: 14 U/L — ABNORMAL LOW (ref 15–41)
Albumin: 3.5 g/dL (ref 3.5–5.0)
Alkaline Phosphatase: 65 U/L (ref 38–126)
Anion gap: 10 (ref 5–15)
BUN: 19 mg/dL (ref 6–20)
CO2: 28 mmol/L (ref 22–32)
Calcium: 9.1 mg/dL (ref 8.9–10.3)
Chloride: 101 mmol/L (ref 98–111)
Creatinine, Ser: 0.57 mg/dL (ref 0.44–1.00)
GFR calc Af Amer: >60 mL/min (ref >60)
GFR calc non Af Amer: >60 mL/min (ref >60)
Glucose, Bld: 105 mg/dL — ABNORMAL HIGH (ref 70–99)
Potassium: 4.8 mmol/L (ref 3.5–5.1)
Sodium: 139 mmol/L (ref 135–145)
Total Bilirubin: 0.9 mg/dL (ref 0.3–1.2)
Total Protein: 7.5 g/dL (ref 6.5–8.1)

## 2019-11-01 LAB — MAGNESIUM: Magnesium: 2.5 mg/dL — ABNORMAL HIGH (ref 1.7–2.4)

## 2019-11-01 LAB — EPSTEIN-BARR VIRUS VCA, IGG: EBV VCA IgG: >600 U/mL — ABNORMAL HIGH (ref 0.0–17.9)

## 2019-11-01 LAB — HEPARIN LEVEL (UNFRACTIONATED)
Heparin Unfractionated: 0.43 IU/mL (ref 0.30–0.70)
Heparin Unfractionated: 0.3 IU/mL (ref 0.30–0.70)

## 2019-11-01 LAB — EPSTEIN-BARR VIRUS VCA, IGM: EBV VCA IgM: <36 U/mL (ref 0.0–35.9)

## 2019-11-01 LAB — LACTATE DEHYDROGENASE: LDH: 725 U/L — ABNORMAL HIGH (ref 98–192)

## 2019-11-01 LAB — PHOSPHORUS: Phosphorus: 3.8 mg/dL (ref 2.5–4.6)

## 2019-11-01 LAB — EPSTEIN-BARR VIRUS NUCLEAR ANTIGEN ANTIBODY, IGG: EBV NA IgG: >600 U/mL — ABNORMAL HIGH (ref 0.0–17.9)

## 2019-11-01 MED ORDER — CLONIDINE HCL 0.1 MG/24HR TD PTWK
0.1000 mg | MEDICATED_PATCH | TRANSDERMAL | Status: DC
  Administered 2019-11-01: 0.1 mg via TRANSDERMAL
  Filled 2019-11-01: qty 1

## 2019-11-01 MED ORDER — FUROSEMIDE 10 MG/ML IJ SOLN
80.0000 mg | Freq: Once | INTRAMUSCULAR | Status: AC
  Administered 2019-11-01: 80 mg via INTRAVENOUS
  Filled 2019-11-01: qty 8

## 2019-11-01 MED ORDER — FLUDROCORTISONE ACETATE 0.1 MG PO TABS
0.1000 mg | ORAL_TABLET | Freq: Every day | ORAL | Status: DC
  Administered 2019-11-03: 0.1 mg via ORAL
  Filled 2019-11-01 (×5): qty 1

## 2019-11-01 MED ORDER — DIPHENHYDRAMINE HCL 50 MG/ML IJ SOLN
50.0000 mg | Freq: Two times a day (BID) | INTRAMUSCULAR | Status: DC
  Administered 2019-11-01 – 2019-11-02 (×3): 50 mg via INTRAVENOUS
  Filled 2019-11-01 (×3): qty 1

## 2019-11-01 NOTE — Progress Notes (Signed)
Patient on BiPap at this time. Attempted HFNC, however, sats unable to maintain.  Complaints of abdominal pain.  PRN pain meds given q 2 hours. Elevated blood pressure. Mom in for visit. Will continue to monitor.   [facility] contacted. No ICU bed available at this time. Patient and Mom aware.

## 2019-11-01 NOTE — Consult Note (Signed)
ANTICOAGULATION CONSULT NOTE  Pharmacy Consult for Heparin Indication: Splenic Vein thrombosis  Patient Measurements: Height: 5\' 1"  (154.9 cm) Weight: 109.6 kg (241 lb 10 oz) IBW/kg (Calculated) : 47.8 Heparin Dosing Weight: 75.8 kg   Vital Signs: Temp: 98 F (36.7 C) (05/29 0400) Temp Source: Axillary (05/29 0400) BP: 138/98 (05/29 0500) Pulse Rate: 110 (05/29 0500)  Labs: Recent Labs    10/29/19 1158 10/29/19 1158 10/29/19 1621 10/29/19 1621 10/30/19 0437 10/30/19 0437 10/30/19 2106 10/31/19 0305 10/31/19 0305 10/31/19 0306 10/31/19 1126 10/31/19 2141 11/01/19 0622  HGB 12.1   < > 11.2*   < > 11.3*   < >  --  11.0*  11.0*  --   --   --   --  10.8*  HCT 37.1   < > 33.9*   < > 34.8*  --   --  34.0*  33.5*  --   --   --   --  33.3*  PLT 482*   < > 430*   < > 442*  --   --  480*  475*  --   --   --   --  507*  APTT 37*  --   --   --   --   --  34  --   --   --   --   --   --   LABPROT 12.9  --   --   --   --   --  13.4  --   --   --   --   --   --   INR 1.0  --   --   --   --   --  1.1  --   --   --   --   --   --   HEPARINUNFRC  --   --   --   --   --   --   --  0.18*   < >  --  0.16* 0.21* 0.43  CREATININE 0.56   < > 0.66   < > 0.54  --   --   --   --  0.63  --   --  0.57  TROPONINIHS 38*  --  32*  --   --   --   --   --   --   --   --   --   --    < > = values in this interval not displayed.    Estimated Creatinine Clearance: 125.2 mL/min (by C-G formula based on SCr of 0.57 mg/dL).   Medical History: Past Medical History:  Diagnosis Date  . COVID-19 04/2019   patient reports diagnosis in Nov 2020  . Hypertension   . Morbid obesity with BMI of 40.0-44.9, adult ([facility])     Medications:  No medications prior to admission.   Scheduled:  . budesonide (PULMICORT) nebulizer solution  0.25 mg Nebulization BID  . Chlorhexidine Gluconate Cloth  6 each Topical Daily  . ipratropium-albuterol  3 mL Nebulization Q4H  . methylPREDNISolone (SOLU-MEDROL) injection   40 mg Intravenous Q12H  . pneumococcal 23 valent vaccine  0.5 mL Intramuscular Tomorrow-1000  . sodium chloride flush  3 mL Intravenous Q12H   Infusions:  . sodium chloride 10 mL/hr at 11/01/19 0000  . ceFEPime (MAXIPIME) IV 2 g (11/01/19 0502)  . famotidine (PEPCID) IV Stopped (10/31/19 2216)  . heparin 2,000 Units/hr (11/01/19 0018)  . metronidazole 500 mg (11/01/19 0501)  . vancomycin  Stopped (10/31/19 2247)   PRN: sodium chloride, acetaminophen, docusate sodium, guaiFENesin-codeine, HYDROcodone-acetaminophen, HYDROmorphone (DILAUDID) injection, ketorolac, ondansetron (ZOFRAN) IV, polyethylene glycol, sodium chloride flush Anti-infectives (From admission, onward)   Start     Dose/Rate Route Frequency Ordered Stop   10/31/19 0500  metroNIDAZOLE (FLAGYL) IVPB 500 mg     500 mg 100 mL/hr over 60 Minutes Intravenous Every 8 hours 10/31/19 0421     10/30/19 2200  vancomycin (VANCOCIN) IVPB 1000 mg/200 mL premix     1,000 mg 200 mL/hr over 60 Minutes Intravenous Every 12 hours 10/30/19 1456     10/30/19 1000  vancomycin (VANCOREADY) IVPB 1500 mg/300 mL  Status:  Discontinued     1,500 mg 150 mL/hr over 120 Minutes Intravenous Every 12 hours 10/29/19 2034 10/30/19 1456   10/29/19 2200  ceFEPIme (MAXIPIME) 2 g in sodium chloride 0.9 % 100 mL IVPB     2 g 200 mL/hr over 30 Minutes Intravenous Every 8 hours 10/29/19 2000     10/29/19 2000  vancomycin (VANCOCIN) IVPB 1000 mg/200 mL premix     1,000 mg 200 mL/hr over 60 Minutes Intravenous  Once 10/29/19 1958 10/29/19 2136   10/29/19 1245  ceFEPIme (MAXIPIME) 2 g in sodium chloride 0.9 % 100 mL IVPB     2 g 200 mL/hr over 30 Minutes Intravenous  Once 10/29/19 1243 10/29/19 1332   10/29/19 1245  metroNIDAZOLE (FLAGYL) IVPB 500 mg     500 mg 100 mL/hr over 60 Minutes Intravenous  Once 10/29/19 1243 10/29/19 1406   10/29/19 1245  vancomycin (VANCOCIN) IVPB 1000 mg/200 mL premix     1,000 mg 200 mL/hr over 60 Minutes Intravenous  Once  10/29/19 1243 10/29/19 1445      Assessment: 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 No DOAC noted PTA. H&H trending down, platelets are elevated at baseline and stable  Heparin Course: 0527 initiation: 4500 unit bolus, then 1200 units/hr 0528 0305 HL 0.18: 2000 unit bolus, then inc to 1400 units/hr 0528 1126 HL 0.16: 2000 unit bolus, then inc to 1700 units/hr 0528 2141 HL 0.21: SUBtherapeutic.  2000 unit bolus then inc to 2000 units/hr   Goal of Therapy:  Heparin level 0.3-0.7 units/ml Monitor platelets by anticoagulation protocol: Yes   Plan:   5/29 @0622  HL=0.43. therapeutic: continue heparin infusion at 2000 units/hr.  Check confirmatory anti-Xa level in 6 hours   Continue to monitor H&H and platelets  Merrill,Kristin A, PharmD 11/01/2019,7:50 AM

## 2019-11-01 NOTE — Progress Notes (Signed)
CRITICAL CARE NOTE  24 yo morbidly obese AAF with previous h/o COVID 89, last month 09/2019 patient had rhinovirus LRTI.  She reports spontaneous pneumonia recurrently. She had admission to [facility] 04/2018 with septic shock , severe metabolic acidosis and multifocal pneumonia placed on vanc/cefepime and eventually required VV-ECMO cannulation while on mechanical ventilation.  She had left femoral vein trauma during this and had to have surgical repair of femoral vein.She was seen by ID and coverage was broadened to vanc/zithromax/fluconazole/meropenem.  Patient then had Left segmental PE likely provoked from ECMO and completed 3 months of eliquis.  Patient comes via EMS. EMS reportslower abdominal pain that radiates to her back. Pt states this started yesterday. Pt states tenderness to belly.Patient denies any N/V. Pt states dizziness and feels like she is going to pass out. Patient appears in distress and diaphoretic on presentation to MICU.     CC  follow up respiratory failure  SUBJECTIVE Patient remains critically ill Prognosis is guarded Alert and awake Severe hypoxia High risk for intubation   BP (!) 132/92   Pulse (!) 112   Temp 99.1 F (37.3 C) (Axillary)   Resp (!) 30   Ht _0  (1.549 m)   Wt 109.6 kg   SpO2 (!) 87%   BMI 45.65 kg/m    I/O last 3 completed shifts: In: 2343.7 [I.V.:593.6; IV Piggyback:1750] Out: 2500 [Urine:2500] Total I/O In: 6 [I.V.:6] Out: -   SpO2: (!) 87 % O2 Flow Rate (L/min): 50 L/min FiO2 (%): 100 %  Estimated body mass index is 45.65 kg/m as calculated from the following:   Height as of this encounter: _1  (1.549 m).   Weight as of this encounter: 109.6 kg.   Review of Systems:  Gen:  Denies  fever, sweats, chills weight loss  HEENT: Denies blurred vision, double vision, ear pain, eye pain, hearing loss, nose bleeds, sore throat Cardiac:  +heaviness ABD +pain improved  Resp:  +SOB same as previous Other:  All other systems  negative       PHYSICAL EXAMINATION:  GENERAL:critically ill appearing, +resp distress, morbid obesity HEAD: Normocephalic, atraumatic.  EYES: Pupils equal, round, reactive to light.  No scleral icterus.  MOUTH: Moist mucosal membrane. NECK: Supple.  PULMONARY: +rhonchi, crackles CARDIOVASCULAR: S1 and S2. Regular rate and rhythm. No murmurs, rubs, or gallops.  GASTROINTESTINAL: +tender, +distended.  Positive bowel sounds.   MUSCULOSKELETAL: No swelling, clubbing, or edema.  NEUROLOGIC: alert and awake SKIN:intact,warm,dry  MEDICATIONS: I have reviewed all medications and confirmed regimen as documented   CULTURE RESULTS   Recent Results (from the past 240 hour(s))  Blood culture (routine x 2)     Status: None (Preliminary result)   Collection Time: 10/29/19 12:39 PM   Specimen: BLOOD  Result Value Ref Range Status   Specimen Description BLOOD LEFT ANTECUBITAL  Final   Special Requests   Final    BOTTLES DRAWN AEROBIC AND ANAEROBIC Blood Culture adequate volume   Culture   Final    NO GROWTH 3 DAYS Performed at [facility], [address], [place], [place] [address]    Report Status PENDING  Incomplete  SARS Coronavirus 2 by RT PCR (hospital order, performed in [place] hospital lab) Nasopharyngeal Nasopharyngeal Swab     Status: None   Collection Time: 10/29/19 12:39 PM   Specimen: Nasopharyngeal Swab  Result Value Ref Range Status   SARS Coronavirus 2 NEGATIVE NEGATIVE Final    Comment: (NOTE) SARS-CoV-2 target nucleic acids are  NOT DETECTED. The SARS-CoV-2 RNA is generally detectable in upper and lower respiratory specimens during the acute phase of infection. The lowest concentration of SARS-CoV-2 viral copies this assay can detect is 250 copies / mL. A negative result does not preclude SARS-CoV-2 infection and should not be used as the sole basis for treatment or other patient management decisions.  A negative result may occur with improper  specimen collection / handling, submission of specimen other than nasopharyngeal swab, presence of viral mutation(s) within the areas targeted by this assay, and inadequate number of viral copies (<250 copies / mL). A negative result must be combined with clinical observations, patient history, and epidemiological information. Fact Sheet for Patients:   StrictlyIdeas.no Fact Sheet for Healthcare Providers: BankingDealers.co.za This test is not yet approved or cleared  by the [place] FDA and has been authorized for detection and/or diagnosis of SARS-CoV-2 by FDA under an Emergency Use Authorization (EUA).  This EUA will remain in effect (meaning this test can be used) for the duration of the COVID-19 declaration under Section 564(b)(1) of the Act, 21 U.S.C. section 360bbb-3(b)(1), unless the authorization is terminated or revoked sooner. Performed at [facility], [address] [place], [place] [address]   MRSA PCR Screening     Status: None   Collection Time: 10/30/19  8:27 AM   Specimen: Nasal Mucosa; Nasopharyngeal  Result Value Ref Range Status   MRSA by PCR NEGATIVE NEGATIVE Final    Comment:        The GeneXpert MRSA Assay (FDA approved for NASAL specimens only), is one component of a comprehensive MRSA colonization surveillance program. It is not intended to diagnose MRSA infection nor to guide or monitor treatment for MRSA infections. Performed at [facility], [address] [place], [place] [address]   Blood culture (routine x 2)     Status: None (Preliminary result)   Collection Time: 10/30/19  9:05 PM   Specimen: BLOOD LEFT FOREARM  Result Value Ref Range Status   Specimen Description BLOOD LEFT FOREARM  Final   Special Requests   Final    BOTTLES DRAWN AEROBIC AND ANAEROBIC Blood Culture results may not be optimal due to an excessive volume of blood received in culture bottles   Culture    Final    NO GROWTH 2 DAYS Performed at [facility], [address] [place], [place] [address]    Report Status PENDING  Incomplete          IMAGING    No results found.   Nutrition Status:          ASSESSMENT AND PLAN SYNOPSIS   Severe ACUTE Hypoxic and Hypercapnic Respiratory Failure from pneumonia and ARDS s/p COVID  -previous h/o ARDS s/p ECMO -High risk for intubation -High risk for cardiac arrest -reviewed serial CT chest- patient has had hilar and mediastinal lymphadenopathy for at least 5 years as evidenced above -may have vasculitic component -awaiting transfer to [facility] per request of family  -EBV-IgG titers >too high to read -patient had WBC count >77K in 2019 during similar episode-[facility] records -Urine cultures with >100,000 colonies Klebsiella - 09/2019- [facility] records -Immune workup including immunoglobulins, ANA, ANCA  All negative -09/2019- [facility] records -Bronchoscopy with BAL - +yeast non crypto, negative PCP, negative histoplasma, negative KOH, negative AFB - 09/2019- [facility] records -all blood cultures have been negative at [facility]-  -oxygen as needed -Lasix as tolerated   Morbid obesity, possible OSA.  Will certainly impact respiratory mechanics,   CARDIAC ICU monitoring  ID -continue IV abx as prescibed -follow up cultures  GI GI PROPHYLAXIS as indicated  NUTRITIONAL STATUS Nutrition Status:         DIET--> as tolerated Constipation protocol as indicated  ENDO - will use ICU hypoglycemic\Hyperglycemia protocol if indicated     ELECTROLYTES -follow labs as needed -replace as needed -pharmacy consultation and following   DVT/GI PRX ordered and assessed TRANSFUSIONS AS NEEDED MONITOR FSBS    CASE DISCUSSED IN [place] WITH ICU TEAM  Critical care provider statement:    Critical care time (minutes):  33   Critical care time was exclusive of:  Separately billable  procedures and  treating other patients   Critical care was necessary to treat or prevent imminent or  life-threatening deterioration of the following conditions:  Acute hypoxemic respiratory failure, bulky hilar/mediastinal lymphadenopathy, OSA, morbid obesity   Critical care was time spent personally by me on the following  activities:  Development of treatment plan with patient or surrogate,  discussions with consultants, evaluation of patient's response to  treatment, examination of patient, obtaining history from patient or  surrogate, ordering and performing treatments and interventions, ordering  and review of laboratory studies and re-evaluation of patient's condition   I assumed direction of critical care for this patient from another  provider in my specialty: no     Critical Care Time devoted to patient care services described in this note is 34 minutes.   Overall, patient is critically ill, prognosis is guarded.   high risk for cardiac arrest and death.   Transfer to [facility] pending   Ottie Glazier, M.D.  Pulmonary & [place]

## 2019-11-01 NOTE — Consult Note (Signed)
ANTICOAGULATION CONSULT NOTE  Pharmacy Consult for Heparin Indication: Splenic Vein thrombosis  Patient Measurements: Height: 5\' 1"  (154.9 cm) Weight: 109.6 kg (241 lb 10 oz) IBW/kg (Calculated) : 47.8 Heparin Dosing Weight: 75.8 kg   Vital Signs: Temp: 99.1 F (37.3 C) (05/29 1200) Temp Source: Axillary (05/29 1200) BP: 134/96 (05/29 1246) Pulse Rate: 119 (05/29 1200)  Labs: Recent Labs    10/29/19 1621 10/29/19 1621 10/30/19 0437 10/30/19 0437 10/30/19 2106 10/31/19 0305 10/31/19 0306 10/31/19 1126 10/31/19 2141 11/01/19 0622 11/01/19 1306  HGB 11.2*   < > 11.3*   < >  --  11.0*  11.0*  --   --   --  10.8*  --   HCT 33.9*   < > 34.8*  --   --  34.0*  33.5*  --   --   --  33.3*  --   PLT 430*   < > 442*  --   --  480*  475*  --   --   --  507*  --   APTT  --   --   --   --  34  --   --   --   --   --   --   LABPROT  --   --   --   --  13.4  --   --   --   --   --   --   INR  --   --   --   --  1.1  --   --   --   --   --   --   HEPARINUNFRC  --   --   --   --   --  0.18*  --    < > 0.21* 0.43 0.30  CREATININE 0.66   < > 0.54  --   --   --  0.63  --   --  0.57  --   TROPONINIHS 32*  --   --   --   --   --   --   --   --   --   --    < > = values in this interval not displayed.    Estimated Creatinine Clearance: 125.2 mL/min (by C-G formula based on SCr of 0.57 mg/dL).   Medical History: Past Medical History:  Diagnosis Date  . COVID-19 04/2019   patient reports diagnosis in Nov 2020  . Hypertension   . Morbid obesity with BMI of 40.0-44.9, adult (HCC)     Medications:  No medications prior to admission.   Scheduled:  . budesonide (PULMICORT) nebulizer solution  0.25 mg Nebulization BID  . Chlorhexidine Gluconate Cloth  6 each Topical Daily  . cloNIDine  0.1 mg Transdermal Weekly  . diphenhydrAMINE  50 mg Intravenous BID  . ipratropium-albuterol  3 mL Nebulization Q4H  . methylPREDNISolone (SOLU-MEDROL) injection  40 mg Intravenous Q12H  .  pneumococcal 23 valent vaccine  0.5 mL Intramuscular Tomorrow-1000  . sodium chloride flush  3 mL Intravenous Q12H   Infusions:  . sodium chloride 10 mL/hr at 11/01/19 0000  . ceFEPime (MAXIPIME) IV 2 g (11/01/19 0502)  . famotidine (PEPCID) IV 20 mg (11/01/19 1012)  . heparin 2,000 Units/hr (11/01/19 0018)  . metronidazole 500 mg (11/01/19 1245)  . vancomycin 1,000 mg (11/01/19 1051)   PRN: sodium chloride, acetaminophen, docusate sodium, guaiFENesin-codeine, HYDROcodone-acetaminophen, HYDROmorphone (DILAUDID) injection, ketorolac, ondansetron (ZOFRAN) IV, polyethylene glycol, sodium chloride flush Anti-infectives (From admission,  onward)   Start     Dose/Rate Route Frequency Ordered Stop   10/31/19 0500  metroNIDAZOLE (FLAGYL) IVPB 500 mg     500 mg 100 mL/hr over 60 Minutes Intravenous Every 8 hours 10/31/19 0421     10/30/19 2200  vancomycin (VANCOCIN) IVPB 1000 mg/200 mL premix     1,000 mg 200 mL/hr over 60 Minutes Intravenous Every 12 hours 10/30/19 1456     10/30/19 1000  vancomycin (VANCOREADY) IVPB 1500 mg/300 mL  Status:  Discontinued     1,500 mg 150 mL/hr over 120 Minutes Intravenous Every 12 hours 10/29/19 2034 10/30/19 1456   10/29/19 2200  ceFEPIme (MAXIPIME) 2 g in sodium chloride 0.9 % 100 mL IVPB     2 g 200 mL/hr over 30 Minutes Intravenous Every 8 hours 10/29/19 2000     10/29/19 2000  vancomycin (VANCOCIN) IVPB 1000 mg/200 mL premix     1,000 mg 200 mL/hr over 60 Minutes Intravenous  Once 10/29/19 1958 10/29/19 2136   10/29/19 1245  ceFEPIme (MAXIPIME) 2 g in sodium chloride 0.9 % 100 mL IVPB     2 g 200 mL/hr over 30 Minutes Intravenous  Once 10/29/19 1243 10/29/19 1332   10/29/19 1245  metroNIDAZOLE (FLAGYL) IVPB 500 mg     500 mg 100 mL/hr over 60 Minutes Intravenous  Once 10/29/19 1243 10/29/19 1406   10/29/19 1245  vancomycin (VANCOCIN) IVPB 1000 mg/200 mL premix     1,000 mg 200 mL/hr over 60 Minutes Intravenous  Once 10/29/19 1243 10/29/19 1445       Assessment: 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 No DOAC noted PTA. H&H trending down, platelets are elevated at baseline and stable  Heparin Course: 0527 initiation: 4500 unit bolus, then 1200 units/hr 0528 0305 HL 0.18: 2000 unit bolus, then inc to 1400 units/hr 0528 1126 HL 0.16: 2000 unit bolus, then inc to 1700 units/hr 0528 2141 HL 0.21: SUBtherapeutic.  2000 unit bolus then inc to 2000 units/hr 5/29 @0622  HL=0.43. therapeutic: continue heparin infusion at 2000 units/hr.   Goal of Therapy:  Heparin level 0.3-0.7 units/ml Monitor platelets by anticoagulation protocol: Yes   Plan:   5/29 @1306  HL=0.30. therapeutic but borderline: will increase Heparin drip to 2100 units/hr.  Check  anti-Xa level with am labs  Continue to monitor H&H and platelets  Rynell Ciotti A, PharmD 11/01/2019,2:01 PM

## 2019-11-01 NOTE — Progress Notes (Signed)
Contacted the [employer] to get an update on the bed request pending for this patient. Per operator, the ICU at [facility] is currently full and they are unable to currently accept or transfer this patient. This RN updated the operator on current patient status, vitals, and plan of care. They will call [place] back when a bed is available.   [place] Phone: [phone]  Cameron Ali, RN

## 2019-11-02 ENCOUNTER — Inpatient Hospital Stay: Payer: Medicaid Other

## 2019-11-02 ENCOUNTER — Encounter: Payer: Self-pay | Admitting: Internal Medicine

## 2019-11-02 LAB — CBC WITH DIFFERENTIAL/PLATELET
Abs Immature Granulocytes: 1.09 10*3/uL — ABNORMAL HIGH (ref 0.00–0.07)
Basophils Absolute: 0.1 10*3/uL (ref 0.0–0.1)
Basophils Relative: 0 %
Eosinophils Absolute: 0.1 10*3/uL (ref 0.0–0.5)
Eosinophils Relative: 0 %
HCT: 35.4 % — ABNORMAL LOW (ref 36.0–46.0)
Hemoglobin: 11.3 g/dL — ABNORMAL LOW (ref 12.0–15.0)
Immature Granulocytes: 2 %
Lymphocytes Relative: 11 %
Lymphs Abs: 5.9 10*3/uL — ABNORMAL HIGH (ref 0.7–4.0)
MCH: 25.6 pg — ABNORMAL LOW (ref 26.0–34.0)
MCHC: 31.9 g/dL (ref 30.0–36.0)
MCV: 80.3 fL (ref 80.0–100.0)
Monocytes Absolute: 3.5 10*3/uL — ABNORMAL HIGH (ref 0.1–1.0)
Monocytes Relative: 6 %
Neutro Abs: 43.7 10*3/uL — ABNORMAL HIGH (ref 1.7–7.7)
Neutrophils Relative %: 81 %
Platelets: 572 10*3/uL — ABNORMAL HIGH (ref 150–400)
RBC: 4.41 MIL/uL (ref 3.87–5.11)
RDW: 16.1 % — ABNORMAL HIGH (ref 11.5–15.5)
Smear Review: NORMAL
WBC: 54.4 10*3/uL (ref 4.0–10.5)
nRBC: 0.1 % (ref 0.0–0.2)

## 2019-11-02 LAB — COMPREHENSIVE METABOLIC PANEL
ALT: 10 U/L (ref 0–44)
AST: 10 U/L — ABNORMAL LOW (ref 15–41)
Albumin: 3.6 g/dL (ref 3.5–5.0)
Alkaline Phosphatase: 69 U/L (ref 38–126)
Anion gap: 11 (ref 5–15)
BUN: 22 mg/dL — ABNORMAL HIGH (ref 6–20)
CO2: 30 mmol/L (ref 22–32)
Calcium: 9.1 mg/dL (ref 8.9–10.3)
Chloride: 100 mmol/L (ref 98–111)
Creatinine, Ser: 0.44 mg/dL (ref 0.44–1.00)
GFR calc Af Amer: >60 mL/min (ref >60)
GFR calc non Af Amer: >60 mL/min (ref >60)
Glucose, Bld: 86 mg/dL (ref 70–99)
Potassium: 4.2 mmol/L (ref 3.5–5.1)
Sodium: 141 mmol/L (ref 135–145)
Total Bilirubin: 1.1 mg/dL (ref 0.3–1.2)
Total Protein: 7.9 g/dL (ref 6.5–8.1)

## 2019-11-02 LAB — PHOSPHORUS: Phosphorus: 3.7 mg/dL (ref 2.5–4.6)

## 2019-11-02 LAB — HEPARIN LEVEL (UNFRACTIONATED)
Heparin Unfractionated: 0.18 IU/mL — ABNORMAL LOW (ref 0.30–0.70)
Heparin Unfractionated: 0.34 IU/mL (ref 0.30–0.70)
Heparin Unfractionated: 0.18 IU/mL — ABNORMAL LOW (ref 0.30–0.70)

## 2019-11-02 LAB — MAGNESIUM: Magnesium: 2.4 mg/dL (ref 1.7–2.4)

## 2019-11-02 MED ORDER — MIDAZOLAM HCL 2 MG/2ML IJ SOLN
4.0000 mg | Freq: Once | INTRAMUSCULAR | Status: AC
  Filled 2019-11-02: qty 4

## 2019-11-02 MED ORDER — MIDAZOLAM HCL 2 MG/2ML IJ SOLN
INTRAMUSCULAR | Status: AC
  Administered 2019-11-02: 4 mg via INTRAVENOUS
  Filled 2019-11-02: qty 4

## 2019-11-02 MED ORDER — ROCURONIUM BROMIDE 50 MG/5ML IV SOLN
INTRAVENOUS | Status: AC
  Filled 2019-11-02: qty 1

## 2019-11-02 MED ORDER — MIDAZOLAM HCL 2 MG/2ML IJ SOLN
1.0000 mg | INTRAMUSCULAR | Status: DC | PRN
  Administered 2019-11-03 (×3): 2 mg via INTRAVENOUS
  Filled 2019-11-02 (×4): qty 2

## 2019-11-02 MED ORDER — DEXMEDETOMIDINE HCL IN NACL 400 MCG/100ML IV SOLN
0.4000 ug/kg/h | INTRAVENOUS | Status: DC
  Administered 2019-11-02: 0.8 ug/kg/h via INTRAVENOUS
  Administered 2019-11-02: 1 ug/kg/h via INTRAVENOUS
  Administered 2019-11-03 – 2019-11-04 (×15): 1.2 ug/kg/h via INTRAVENOUS
  Filled 2019-11-02 (×17): qty 100

## 2019-11-02 MED ORDER — SODIUM CHLORIDE 0.9 % IV SOLN
25.0000 ug/min | INTRAVENOUS | Status: DC
  Administered 2019-11-02: 25 ug/min via INTRAVENOUS
  Filled 2019-11-02: qty 10

## 2019-11-02 MED ORDER — SODIUM CHLORIDE 0.9 % IV SOLN: 250.0000 mL | INTRAVENOUS | Status: DC

## 2019-11-02 MED ORDER — FENTANYL CITRATE (PF) 100 MCG/2ML IJ SOLN: 100.0000 ug | Freq: Once | INTRAMUSCULAR | Status: AC

## 2019-11-02 MED ORDER — MIDAZOLAM 50MG/50ML (1MG/ML) PREMIX INFUSION: 2.0000 mg/h | INTRAVENOUS | Status: DC

## 2019-11-02 MED ORDER — HEPARIN BOLUS VIA INFUSION
2000.0000 [IU] | Freq: Once | INTRAVENOUS | Status: AC
  Administered 2019-11-02: 2000 [IU] via INTRAVENOUS
  Filled 2019-11-02: qty 2000

## 2019-11-02 MED ORDER — MIDAZOLAM HCL 2 MG/2ML IJ SOLN
4.0000 mg | Freq: Once | INTRAMUSCULAR | Status: AC
  Administered 2019-11-02: 4 mg via INTRAVENOUS

## 2019-11-02 MED ORDER — VECURONIUM BROMIDE 10 MG IV SOLR
INTRAVENOUS | Status: AC
  Administered 2019-11-02: 10 mg via INTRAVENOUS
  Filled 2019-11-02: qty 10

## 2019-11-02 MED ORDER — VECURONIUM BROMIDE 10 MG IV SOLR: 10.0000 mg | Freq: Once | INTRAVENOUS | Status: AC

## 2019-11-02 MED ORDER — CHLORHEXIDINE GLUCONATE 0.12% ORAL RINSE (MEDLINE KIT)
15.0000 mL | Freq: Two times a day (BID) | OROMUCOSAL | Status: DC
  Administered 2019-11-02 – 2019-11-04 (×5): 15 mL via OROMUCOSAL

## 2019-11-02 MED ORDER — ROCURONIUM BROMIDE 50 MG/5ML IV SOLN: 40.0000 mg | Freq: Once | INTRAVENOUS | Status: DC

## 2019-11-02 MED ORDER — PHENYLEPHRINE HCL-NACL 10-0.9 MG/250ML-% IV SOLN
25.0000 ug/min | INTRAVENOUS | Status: DC
  Filled 2019-11-02: qty 250

## 2019-11-02 MED ORDER — MIDAZOLAM HCL 2 MG/2ML IJ SOLN
INTRAMUSCULAR | Status: AC
  Filled 2019-11-02: qty 2

## 2019-11-02 MED ORDER — FENTANYL 2500MCG IN NS 250ML (10MCG/ML) PREMIX INFUSION
INTRAVENOUS | Status: AC
  Filled 2019-11-02: qty 250

## 2019-11-02 MED ORDER — FENTANYL CITRATE (PF) 100 MCG/2ML IJ SOLN
INTRAMUSCULAR | Status: AC
  Administered 2019-11-02: 100 ug via INTRAVENOUS
  Filled 2019-11-02: qty 2

## 2019-11-02 MED ORDER — MIDAZOLAM HCL 2 MG/2ML IJ SOLN
2.0000 mg | Freq: Once | INTRAMUSCULAR | Status: AC
  Administered 2019-11-02: 2 mg via INTRAVENOUS

## 2019-11-02 MED ORDER — PHENYLEPHRINE CONCENTRATED 100MG/250ML (0.4 MG/ML) INFUSION SIMPLE
0.0000 ug/min | INTRAVENOUS | Status: DC
  Administered 2019-11-02: 80 ug/min via INTRAVENOUS
  Administered 2019-11-03: 100 ug/min via INTRAVENOUS
  Filled 2019-11-02 (×2): qty 250

## 2019-11-02 MED ORDER — IBUPROFEN 100 MG/5ML PO SUSP
600.0000 mg | Freq: Once | ORAL | Status: AC
  Administered 2019-11-03: 600 mg
  Filled 2019-11-02 (×2): qty 30

## 2019-11-02 MED ORDER — FENTANYL 2500MCG IN NS 250ML (10MCG/ML) PREMIX INFUSION
0.0000 ug/h | INTRAVENOUS | Status: DC
  Administered 2019-11-02: 200 ug/h via INTRAVENOUS
  Administered 2019-11-03 – 2019-11-04 (×8): 400 ug/h via INTRAVENOUS
  Filled 2019-11-02 (×8): qty 250

## 2019-11-02 MED ORDER — FUROSEMIDE 10 MG/ML IJ SOLN
40.0000 mg | Freq: Two times a day (BID) | INTRAMUSCULAR | Status: DC
  Administered 2019-11-02: 40 mg via INTRAVENOUS
  Filled 2019-11-02: qty 4

## 2019-11-02 MED ORDER — ORAL CARE MOUTH RINSE
15.0000 mL | OROMUCOSAL | Status: DC
  Administered 2019-11-02 – 2019-11-04 (×22): 15 mL via OROMUCOSAL

## 2019-11-02 NOTE — Procedures (Signed)
Endotracheal Intubation: Patient required placement of an artificial airway secondary to Respiratory Failure  Consent: Family signed consent in presence of RN who witnessed and myself   Hand washing performed prior to starting the procedure.   Medications administered for sedation prior to procedure:  Midazolam 4 mg IV,  Vecuronium 10 mg IV, Fentanyl 100 mcg IV.    A time out procedure was called and correct patient, name, & ID confirmed. Needed supplies and equipment were assembled and checked to include ETT, 10 ml syringe, Glidescope, Mac and Miller blades, suction, oxygen and bag mask valve, end tidal CO2 monitor.   Patient was positioned to align the mouth and pharynx to facilitate visualization of the glottis.   Heart rate, SpO2 and blood pressure was continuously monitored during the procedure. Pre-oxygenation was conducted prior to intubation and endotracheal tube was placed through the vocal cords into the trachea.     The artificial airway was placed under direct visualization via glidescope route using a 8.5 ETT on the first attempt.  ETT was secured at 24 cm mark.  Placement was confirmed by auscuitation of lungs with good breath sounds bilaterally and no stomach sounds.  Condensation was noted on endotracheal tube.   Pulse ox 98%.  CO2 detector in place with appropriate color change.   Complications: None .    Chest radiograph ordered and pending.   Comments: OGT placed via glidescope.    Ottie Glazier, M.D.  Pulmonary & [place]

## 2019-11-02 NOTE — Care Plan (Signed)
CRITICAL CARE PROGRESS NOTE  -Patient is awaiting transfer to tertiary center for further management.  Have been unsuccessful in getting bed placement at [facility].  Have discussed with patient regarding transfer to a different clinical site.  Patient is agreeable to [facility] transfer.  I have called [facility] and transfer board to discuss transfer of patient.  Case discussed with [facility] staff.  Critical care physician on call Deeann Dowse MD) received request however at this time facility is at full capacity and patient has not been accepted and in general it was explained that transfer to [facility] is currently not available.    Ottie Glazier, M.D.  Pulmonary & [place]

## 2019-11-02 NOTE — Consult Note (Signed)
ANTICOAGULATION CONSULT NOTE  Pharmacy Consult for Heparin Indication: Splenic Vein thrombosis  Patient Measurements: Height: '5\' 1"'  (154.9 cm) Weight: 109.6 kg (241 lb 10 oz) IBW/kg (Calculated) : 47.8 Heparin Dosing Weight: 75.8 kg   Vital Signs: Temp: 102.6 F (39.2 C) (05/30 2330) Temp Source: Oral (05/30 2330) BP: 125/73 (05/30 2330) Pulse Rate: 104 (05/30 2330)  Labs: Recent Labs     0000 10/31/19 0305 10/31/19 0306 10/31/19 1126 11/01/19 0622 11/01/19 1306 11/02/19 0459 11/02/19 1220 11/02/19 2130  HGB   < > 11.0*  11.0*  --   --  10.8*  --  11.3*  --   --   HCT  --  34.0*  33.5*  --   --  33.3*  --  35.4*  --   --   PLT  --  480*  475*  --   --  507*  --  572*  --   --   HEPARINUNFRC  --  0.18*  --    < > 0.43   < > 0.18* 0.34 0.18*  CREATININE  --   --  0.63  --  0.57  --  0.44  --   --    < > = values in this interval not displayed.    Estimated Creatinine Clearance: 125.2 mL/min (by C-G formula based on SCr of 0.44 mg/dL).   Medical History: Past Medical History:  Diagnosis Date  . COVID-19 04/2019   patient reports diagnosis in Nov 2020  . Hypertension   . Morbid obesity with BMI of 40.0-44.9, adult (HCC)     Medications:  No medications prior to admission.   Scheduled:  . budesonide (PULMICORT) nebulizer solution  0.25 mg Nebulization BID  . chlorhexidine gluconate (MEDLINE KIT)  15 mL Mouth Rinse BID  . Chlorhexidine Gluconate Cloth  6 each Topical Daily  . cloNIDine  0.1 mg Transdermal Weekly  . diphenhydrAMINE  50 mg Intravenous BID  . fludrocortisone  0.1 mg Oral Daily  . furosemide  40 mg Intravenous BID  . ipratropium-albuterol  3 mL Nebulization Q4H  . mouth rinse  15 mL Mouth Rinse 10 times per day  . pneumococcal 23 valent vaccine  0.5 mL Intramuscular Tomorrow-1000  . rocuronium  40 mg Intravenous Once  . sodium chloride flush  3 mL Intravenous Q12H   Infusions:  . sodium chloride Stopped (11/02/19 2259)  . sodium chloride  Stopped (11/02/19 2213)  . ceFEPime (MAXIPIME) IV Stopped (11/02/19 2241)  . dexmedetomidine (PRECEDEX) IV infusion 1 mcg/kg/hr (11/02/19 2300)  . famotidine (PEPCID) IV 100 mL/hr at 11/02/19 2300  . fentaNYL infusion INTRAVENOUS 300 mcg/hr (11/02/19 2300)  . heparin 2,300 Units/hr (11/02/19 2300)  . metronidazole 100 mL/hr at 11/02/19 2201  . phenylephrine (NEO-SYNEPHRINE) Adult infusion 70 mcg/min (11/02/19 2300)  . vancomycin 200 mL/hr at 11/02/19 2300   PRN: sodium chloride, acetaminophen, docusate sodium, guaiFENesin-codeine, HYDROcodone-acetaminophen, HYDROmorphone (DILAUDID) injection, midazolam, ondansetron (ZOFRAN) IV, polyethylene glycol, sodium chloride flush Anti-infectives (From admission, onward)   Start     Dose/Rate Route Frequency Ordered Stop   10/31/19 0500  metroNIDAZOLE (FLAGYL) IVPB 500 mg     500 mg 100 mL/hr over 60 Minutes Intravenous Every 8 hours 10/31/19 0421     10/30/19 2200  vancomycin (VANCOCIN) IVPB 1000 mg/200 mL premix     1,000 mg 200 mL/hr over 60 Minutes Intravenous Every 12 hours 10/30/19 1456     10/30/19 1000  vancomycin (VANCOREADY) IVPB 1500 mg/300 mL  Status:  Discontinued  1,500 mg 150 mL/hr over 120 Minutes Intravenous Every 12 hours 10/29/19 2034 10/30/19 1456   10/29/19 2200  ceFEPIme (MAXIPIME) 2 g in sodium chloride 0.9 % 100 mL IVPB     2 g 200 mL/hr over 30 Minutes Intravenous Every 8 hours 10/29/19 2000     10/29/19 2000  vancomycin (VANCOCIN) IVPB 1000 mg/200 mL premix     1,000 mg 200 mL/hr over 60 Minutes Intravenous  Once 10/29/19 1958 10/29/19 2136   10/29/19 1245  ceFEPIme (MAXIPIME) 2 g in sodium chloride 0.9 % 100 mL IVPB     2 g 200 mL/hr over 30 Minutes Intravenous  Once 10/29/19 1243 10/29/19 1332   10/29/19 1245  metroNIDAZOLE (FLAGYL) IVPB 500 mg     500 mg 100 mL/hr over 60 Minutes Intravenous  Once 10/29/19 1243 10/29/19 1406   10/29/19 1245  vancomycin (VANCOCIN) IVPB 1000 mg/200 mL premix     1,000 mg 200  mL/hr over 60 Minutes Intravenous  Once 10/29/19 1243 10/29/19 1445      Assessment: 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 No DOAC noted PTA. H&H trending down, platelets are elevated at baseline and stable  Heparin Course: 0527 initiation: 4500 unit bolus, then 1200 units/hr 0528 0305 HL 0.18: 2000 unit bolus, then inc to 1400 units/hr 0528 1126 HL 0.16: 2000 unit bolus, then inc to 1700 units/hr 0528 2141 HL 0.21: SUBtherapeutic.  2000 unit bolus then inc to 2000 units/hr 5/29 '@0622'  HL=0.43. therapeutic: continue heparin infusion at 2000 units/hr. 5/30 '@0459'  HL=0.18. SUBtherapeutic. Will increase Heparin drip to 2300 units/hr.   Goal of Therapy:  Heparin level 0.3-0.7 units/ml Monitor platelets by anticoagulation protocol: Yes   Plan:   5/30 '@2130'  HL=0.18. SUBtherapeutic. Will give 2000 unit rebolus and increase Heparin drip to 2500 units/hr.  Check anti-Xa level in 6 hours   Continue to monitor H&H and platelets  Ena Dawley, PharmD 11/02/2019,11:42 PM

## 2019-11-02 NOTE — Progress Notes (Signed)
Patient intubated at this time. Tolerated well.  Cultures sent to lab.   Precedex drip and Fentanyl drip infusing at this time.  Grandmother at bedside.  Will continue to monitor.

## 2019-11-02 NOTE — Progress Notes (Signed)
CRITICAL CARE NOTE  24 yo morbidly obese AAF with previous h/o COVID 14, last month 09/2019 patient had rhinovirus LRTI.  She reports spontaneous pneumonia recurrently. She had admission to [facility] 04/2018 with septic shock , severe metabolic acidosis and multifocal pneumonia placed on vanc/cefepime and eventually required VV-ECMO cannulation while on mechanical ventilation.  She had left femoral vein trauma during this and had to have surgical repair of femoral vein.She was seen by ID and coverage was broadened to vanc/zithromax/fluconazole/meropenem.  Patient then had Left segmental PE likely provoked from ECMO and completed 3 months of eliquis.  Patient comes via EMS. EMS reportslower abdominal pain that radiates to her back. Pt states this started yesterday. Pt states tenderness to belly.Patient denies any N/V. Pt states dizziness and feels like she is going to pass out. Patient appears in distress and diaphoretic on presentation to MICU.   11/02/19- patient is continues to be severely hypoxemic on 60% HFNC elevated setting with unsucsessful trial of weaning O2 few days in a row already. I discussed case with mother today she does not know what is the diagnosis patient has despite repeated admissions to hospital and repeated abnormal CT chest. They have asked for workup to be done on [facility] however we have not been able to secure transfer due to bed shortages there.  [facility] has declined to accept her.  I have explained to patient that she is very tachypneic and tachycardic and high risk for death. Family has asked for elective endotracheal intubation.    CC  follow up respiratory failure  SUBJECTIVE Patient remains critically ill Prognosis is guarded Alert and awake Severe hypoxia High risk for intubation   BP 104/86   Pulse (!) 133   Temp (!) 101.5 F (38.6 C) (Axillary)   Resp (!) 29   Ht _0  (1.549 m)   Wt 109.6 kg   SpO2 100%   BMI 45.65 kg/m    I/O last 3 completed shifts: In:  3327.1 [I.V.:1083; IV Piggyback:2244.1] Out: 2500 [Urine:2500] Total I/O In: 624 [I.V.:188.3; IV Piggyback:435.7] Out: 1050 [Urine:1050]  SpO2: 100 % O2 Flow Rate (L/min): 50 L/min FiO2 (%): 60 %  Estimated body mass index is 45.65 kg/m as calculated from the following:   Height as of this encounter: _1  (1.549 m).   Weight as of this encounter: 109.6 kg.   Review of Systems:  Gen:  Denies  fever, sweats, chills weight loss  HEENT: Denies blurred vision, double vision, ear pain, eye pain, hearing loss, nose bleeds, sore throat Cardiac:  +heaviness ABD +pain improved  Resp:  +SOB same as previous Other:  All other systems negative       PHYSICAL EXAMINATION:  GENERAL:critically ill appearing, +resp distress, morbid obesity HEAD: Normocephalic, atraumatic.  EYES: Pupils equal, round, reactive to light.  No scleral icterus.  MOUTH: Moist mucosal membrane. NECK: Supple.  PULMONARY: +rhonchi, crackles CARDIOVASCULAR: S1 and S2. Regular rate and rhythm. No murmurs, rubs, or gallops.  GASTROINTESTINAL: +tender, +distended.  Positive bowel sounds.   MUSCULOSKELETAL: No swelling, clubbing, or edema.  NEUROLOGIC: alert and awake SKIN:intact,warm,dry  MEDICATIONS: I have reviewed all medications and confirmed regimen as documented   CULTURE RESULTS   Recent Results (from the past 240 hour(s))  Blood culture (routine x 2)     Status: None (Preliminary result)   Collection Time: 10/29/19 12:39 PM   Specimen: BLOOD  Result Value Ref Range Status   Specimen Description BLOOD LEFT ANTECUBITAL  Final   Special Requests  Final    BOTTLES DRAWN AEROBIC AND ANAEROBIC Blood Culture adequate volume   Culture   Final    NO GROWTH 4 DAYS Performed at [facility], [address] [place], [place] [address]    Report Status PENDING  Incomplete  SARS Coronavirus 2 by RT PCR (hospital order, performed in [facility] hospital lab) Nasopharyngeal Nasopharyngeal Swab      Status: None   Collection Time: 10/29/19 12:39 PM   Specimen: Nasopharyngeal Swab  Result Value Ref Range Status   SARS Coronavirus 2 NEGATIVE NEGATIVE Final    Comment: (NOTE) SARS-CoV-2 target nucleic acids are NOT DETECTED. The SARS-CoV-2 RNA is generally detectable in upper and lower respiratory specimens during the acute phase of infection. The lowest concentration of SARS-CoV-2 viral copies this assay can detect is 250 copies / mL. A negative result does not preclude SARS-CoV-2 infection and should not be used as the sole basis for treatment or other patient management decisions.  A negative result may occur with improper specimen collection / handling, submission of specimen other than nasopharyngeal swab, presence of viral mutation(s) within the areas targeted by this assay, and inadequate number of viral copies (<250 copies / mL). A negative result must be combined with clinical observations, patient history, and epidemiological information. Fact Sheet for Patients:   StrictlyIdeas.no Fact Sheet for Healthcare Providers: BankingDealers.co.za This test is not yet approved or cleared  by the [place] FDA and has been authorized for detection and/or diagnosis of SARS-CoV-2 by FDA under an Emergency Use Authorization (EUA).  This EUA will remain in effect (meaning this test can be used) for the duration of the COVID-19 declaration under Section 564(b)(1) of the Act, 21 U.S.C. section 360bbb-3(b)(1), unless the authorization is terminated or revoked sooner. Performed at [facility], [address] [place], [place] [address]   MRSA PCR Screening     Status: None   Collection Time: 10/30/19  8:27 AM   Specimen: Nasal Mucosa; Nasopharyngeal  Result Value Ref Range Status   MRSA by PCR NEGATIVE NEGATIVE Final    Comment:        The GeneXpert MRSA Assay (FDA approved for NASAL specimens only), is one component of  a comprehensive MRSA colonization surveillance program. It is not intended to diagnose MRSA infection nor to guide or monitor treatment for MRSA infections. Performed at [facility], [address] [place], [place] [address]   Blood culture (routine x 2)     Status: None (Preliminary result)   Collection Time: 10/30/19  9:05 PM   Specimen: BLOOD LEFT FOREARM  Result Value Ref Range Status   Specimen Description BLOOD LEFT FOREARM  Final   Special Requests   Final    BOTTLES DRAWN AEROBIC AND ANAEROBIC Blood Culture results may not be optimal due to an excessive volume of blood received in culture bottles   Culture   Final    NO GROWTH 3 DAYS Performed at [facility], [address] [place], [place] [address]    Report Status PENDING  Incomplete          IMAGING    No results found.   Nutrition Status:          ASSESSMENT AND PLAN SYNOPSIS   Severe ACUTE Hypoxic and Hypercapnic Respiratory Failure from pneumonia and ARDS s/p COVID  -previous h/o ARDS s/p ECMO-04/2018 -High risk for intubation -High risk for cardiac arrest -reviewed serial CT chest- patient has had hilar and mediastinal lymphadenopathy  for at least 5 years as evidenced above -may have vasculitic component -awaiting transfer to [facility] per request of family  -EBV-IgG titers >too high to read -patient had WBC count >77K in 2019 during similar episode-[facility] records -Urine cultures with >100,000 colonies Klebsiella - 09/2019- [facility] records -Immune workup including immunoglobulins, ANA, ANCA  All negative -09/2019- [facility] records -Bronchoscopy with BAL - +yeast non crypto, negative PCP, negative histoplasma, negative KOH, negative AFB - 09/2019- [facility] records -all blood cultures have been negative at [place]-  -oxygen as needed -Lasix as tolerated   Morbid obesity, possible OSA.   Will certainly impact respiratory mechanics,   CARDIAC ICU  monitoring  ID -continue IV abx as prescibed -follow up cultures  GI GI PROPHYLAXIS as indicated  NUTRITIONAL STATUS Nutrition Status:         DIET--> as tolerated Constipation protocol as indicated  ENDO - will use ICU hypoglycemic\Hyperglycemia protocol if indicated     ELECTROLYTES -follow labs as needed -replace as needed -pharmacy consultation and following   DVT/GI PRX ordered and assessed TRANSFUSIONS AS NEEDED MONITOR FSBS    CASE DISCUSSED IN [place] WITH ICU TEAM  Critical care provider statement:    Critical care time (minutes):  33   Critical care time was exclusive of:  Separately billable procedures and  treating other patients   Critical care was necessary to treat or prevent imminent or  life-threatening deterioration of the following conditions:  Acute hypoxemic respiratory failure, bulky hilar/mediastinal lymphadenopathy, OSA, morbid obesity   Critical care was time spent personally by me on the following  activities:  Development of treatment plan with patient or surrogate,  discussions with consultants, evaluation of patient's response to  treatment, examination of patient, obtaining history from patient or  surrogate, ordering and performing treatments and interventions, ordering  and review of laboratory studies and re-evaluation of patient's condition   I assumed direction of critical care for this patient from another  provider in my specialty: no     Critical Care Time devoted to patient care services described in this note is 34 minutes.   Overall, patient is critically ill, prognosis is guarded.   high risk for cardiac arrest and death.   Transfer to [facility] pending   Ottie Glazier, M.D.  Pulmonary & [place]

## 2019-11-02 NOTE — Procedures (Signed)
Central Venous Catheter Insertion Procedure Note Yesenia Carrillo [id number] April 09, 1996  Procedure: Insertion of Central Venous Catheter Indications: Assessment of intravascular volume, Drug and/or fluid administration and Frequent blood sampling  Procedure Details Consent: Risks of procedure as well as the alternatives and risks of each were explained to the (patient/caregiver).  Consent for procedure obtained. Time Out: Verified patient identification, verified procedure, site/side was marked, verified correct patient position, special equipment/implants available, medications/allergies/relevent history reviewed, required imaging and test results available.  Performed  Maximum sterile technique was used including antiseptics, cap, gloves, gown, hand hygiene, mask and sheet. Skin prep: Chlorhexidine; local anesthetic administered A antimicrobial bonded/coated triple lumen catheter was placed in the right internal jugular vein using the Seldinger technique.  Evaluation Blood flow good Complications: No apparent complications Patient did tolerate procedure well. Chest X-ray ordered to verify placement.  CXR: pending.  Right internal jugular central line placed utilizing ultrasound no complications noted during or following procedure.   [name], [place] Pager [phone] (please enter 7 digits) PCCM Consult Pager [phone] (please enter 7 digits)

## 2019-11-02 NOTE — Consult Note (Signed)
ANTICOAGULATION CONSULT NOTE  Pharmacy Consult for Heparin Indication: Splenic Vein thrombosis  Patient Measurements: Height: 5\' 1"  (154.9 cm) Weight: 109.6 kg (241 lb 10 oz) IBW/kg (Calculated) : 47.8 Heparin Dosing Weight: 75.8 kg   Vital Signs: Temp: 101.5 F (38.6 C) (05/30 1200) Temp Source: Axillary (05/30 1200) BP: 126/80 (05/30 1200) Pulse Rate: 133 (05/30 1200)  Labs: Recent Labs     0000 10/30/19 2106 10/31/19 0305 10/31/19 0306 10/31/19 1126 11/01/19 0622 11/01/19 0622 11/01/19 1306 11/02/19 0459 11/02/19 1220  HGB   < >  --  11.0*  11.0*  --   --  10.8*  --   --  11.3*  --   HCT  --   --  34.0*  33.5*  --   --  33.3*  --   --  35.4*  --   PLT  --   --  480*  475*  --   --  507*  --   --  572*  --   APTT  --  34  --   --   --   --   --   --   --   --   LABPROT  --  13.4  --   --   --   --   --   --   --   --   INR  --  1.1  --   --   --   --   --   --   --   --   HEPARINUNFRC  --   --  0.18*  --    < > 0.43   < > 0.30 0.18* 0.34  CREATININE  --   --   --  0.63  --  0.57  --   --  0.44  --    < > = values in this interval not displayed.    Estimated Creatinine Clearance: 125.2 mL/min (by C-G formula based on SCr of 0.44 mg/dL).   Medical History: Past Medical History:  Diagnosis Date  . COVID-19 04/2019   patient reports diagnosis in Nov 2020  . Hypertension   . Morbid obesity with BMI of 40.0-44.9, adult (HCC)     Medications:  No medications prior to admission.   Scheduled:  . budesonide (PULMICORT) nebulizer solution  0.25 mg Nebulization BID  . Chlorhexidine Gluconate Cloth  6 each Topical Daily  . cloNIDine  0.1 mg Transdermal Weekly  . diphenhydrAMINE  50 mg Intravenous BID  . fludrocortisone  0.1 mg Oral Daily  . furosemide  40 mg Intravenous BID  . ipratropium-albuterol  3 mL Nebulization Q4H  . pneumococcal 23 valent vaccine  0.5 mL Intramuscular Tomorrow-1000  . sodium chloride flush  3 mL Intravenous Q12H   Infusions:  .  sodium chloride 5 mL/hr at 11/02/19 0700  . ceFEPime (MAXIPIME) IV 2 g (11/02/19 1334)  . famotidine (PEPCID) IV 20 mg (11/02/19 1039)  . heparin 2,300 Units/hr (11/02/19 0700)  . metronidazole 500 mg (11/02/19 1255)  . vancomycin 1,000 mg (11/02/19 1115)   PRN: sodium chloride, acetaminophen, docusate sodium, guaiFENesin-codeine, HYDROcodone-acetaminophen, HYDROmorphone (DILAUDID) injection, ondansetron (ZOFRAN) IV, polyethylene glycol, sodium chloride flush Anti-infectives (From admission, onward)   Start     Dose/Rate Route Frequency Ordered Stop   10/31/19 0500  metroNIDAZOLE (FLAGYL) IVPB 500 mg     500 mg 100 mL/hr over 60 Minutes Intravenous Every 8 hours 10/31/19 0421     10/30/19 2200  vancomycin (VANCOCIN) IVPB 1000  mg/200 mL premix     1,000 mg 200 mL/hr over 60 Minutes Intravenous Every 12 hours 10/30/19 1456     10/30/19 1000  vancomycin (VANCOREADY) IVPB 1500 mg/300 mL  Status:  Discontinued     1,500 mg 150 mL/hr over 120 Minutes Intravenous Every 12 hours 10/29/19 2034 10/30/19 1456   10/29/19 2200  ceFEPIme (MAXIPIME) 2 g in sodium chloride 0.9 % 100 mL IVPB     2 g 200 mL/hr over 30 Minutes Intravenous Every 8 hours 10/29/19 2000     10/29/19 2000  vancomycin (VANCOCIN) IVPB 1000 mg/200 mL premix     1,000 mg 200 mL/hr over 60 Minutes Intravenous  Once 10/29/19 1958 10/29/19 2136   10/29/19 1245  ceFEPIme (MAXIPIME) 2 g in sodium chloride 0.9 % 100 mL IVPB     2 g 200 mL/hr over 30 Minutes Intravenous  Once 10/29/19 1243 10/29/19 1332   10/29/19 1245  metroNIDAZOLE (FLAGYL) IVPB 500 mg     500 mg 100 mL/hr over 60 Minutes Intravenous  Once 10/29/19 1243 10/29/19 1406   10/29/19 1245  vancomycin (VANCOCIN) IVPB 1000 mg/200 mL premix     1,000 mg 200 mL/hr over 60 Minutes Intravenous  Once 10/29/19 1243 10/29/19 1445      Assessment: 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 No DOAC noted PTA. H&H trending down, platelets are elevated at  baseline and stable  Heparin Course: 0527 initiation: 4500 unit bolus, then 1200 units/hr 0528 0305 HL 0.18: 2000 unit bolus, then inc to 1400 units/hr 0528 1126 HL 0.16: 2000 unit bolus, then inc to 1700 units/hr 0528 2141 HL 0.21: SUBtherapeutic.  2000 unit bolus then inc to 2000 units/hr 5/29 @0622  HL=0.43. therapeutic: continue heparin infusion at 2000 units/hr. 5/30 @0459  HL=0.18. SUBtherapeutic. Will increase Heparin drip to 2300 units/hr.   Goal of Therapy:  Heparin level 0.3-0.7 units/ml Monitor platelets by anticoagulation protocol: Yes   Plan:   5/30 @1220  HL=0.34. therapeutic. Will continue Heparin drip at 2300 units/hr.  Check confirmatory  anti-Xa level in 6 hours   Continue to monitor H&H and platelets  Cheynne Virden A, PharmD 11/02/2019,1:47 PM

## 2019-11-02 NOTE — Consult Note (Signed)
ANTICOAGULATION CONSULT NOTE  Pharmacy Consult for Heparin Indication: Splenic Vein thrombosis  Patient Measurements: Height: 5\' 1"  (154.9 cm) Weight: 109.6 kg (241 lb 10 oz) IBW/kg (Calculated) : 47.8 Heparin Dosing Weight: 75.8 kg   Vital Signs: Temp: 98.3 F (36.8 C) (05/30 0400) Temp Source: Axillary (05/30 0400) BP: 129/94 (05/30 0600) Pulse Rate: 118 (05/30 0600)  Labs: Recent Labs    10/30/19 2106 10/31/19 0305 10/31/19 0306 10/31/19 1126 11/01/19 0622 11/01/19 1306 11/02/19 0459  HGB  --  11.0*  11.0*  --   --  10.8*  --   --   HCT  --  34.0*  33.5*  --   --  33.3*  --   --   PLT  --  480*  475*  --   --  507*  --   --   APTT 34  --   --   --   --   --   --   LABPROT 13.4  --   --   --   --   --   --   INR 1.1  --   --   --   --   --   --   HEPARINUNFRC  --  0.18*  --    < > 0.43 0.30 0.18*  CREATININE  --   --  0.63  --  0.57  --  0.44   < > = values in this interval not displayed.    Estimated Creatinine Clearance: 125.2 mL/min (by C-G formula based on SCr of 0.44 mg/dL).   Medical History: Past Medical History:  Diagnosis Date  . COVID-19 04/2019   patient reports diagnosis in Nov 2020  . Hypertension   . Morbid obesity with BMI of 40.0-44.9, adult (HCC)     Medications:  No medications prior to admission.   Scheduled:  . budesonide (PULMICORT) nebulizer solution  0.25 mg Nebulization BID  . Chlorhexidine Gluconate Cloth  6 each Topical Daily  . cloNIDine  0.1 mg Transdermal Weekly  . diphenhydrAMINE  50 mg Intravenous BID  . fludrocortisone  0.1 mg Oral Daily  . ipratropium-albuterol  3 mL Nebulization Q4H  . pneumococcal 23 valent vaccine  0.5 mL Intramuscular Tomorrow-1000  . sodium chloride flush  3 mL Intravenous Q12H   Infusions:  . sodium chloride 5 mL/hr at 11/02/19 0400  . ceFEPime (MAXIPIME) IV Stopped (11/02/19 0018)  . famotidine (PEPCID) IV Stopped (11/02/19 0153)  . heparin 2,100 Units/hr (11/02/19 0400)  .  metronidazole 500 mg (11/02/19 0415)  . vancomycin Stopped (11/02/19 0121)   PRN: sodium chloride, acetaminophen, docusate sodium, guaiFENesin-codeine, HYDROcodone-acetaminophen, HYDROmorphone (DILAUDID) injection, ondansetron (ZOFRAN) IV, polyethylene glycol, sodium chloride flush Anti-infectives (From admission, onward)   Start     Dose/Rate Route Frequency Ordered Stop   10/31/19 0500  metroNIDAZOLE (FLAGYL) IVPB 500 mg     500 mg 100 mL/hr over 60 Minutes Intravenous Every 8 hours 10/31/19 0421     10/30/19 2200  vancomycin (VANCOCIN) IVPB 1000 mg/200 mL premix     1,000 mg 200 mL/hr over 60 Minutes Intravenous Every 12 hours 10/30/19 1456     10/30/19 1000  vancomycin (VANCOREADY) IVPB 1500 mg/300 mL  Status:  Discontinued     1,500 mg 150 mL/hr over 120 Minutes Intravenous Every 12 hours 10/29/19 2034 10/30/19 1456   10/29/19 2200  ceFEPIme (MAXIPIME) 2 g in sodium chloride 0.9 % 100 mL IVPB     2 g 200 mL/hr over 30  Minutes Intravenous Every 8 hours 10/29/19 2000     10/29/19 2000  vancomycin (VANCOCIN) IVPB 1000 mg/200 mL premix     1,000 mg 200 mL/hr over 60 Minutes Intravenous  Once 10/29/19 1958 10/29/19 2136   10/29/19 1245  ceFEPIme (MAXIPIME) 2 g in sodium chloride 0.9 % 100 mL IVPB     2 g 200 mL/hr over 30 Minutes Intravenous  Once 10/29/19 1243 10/29/19 1332   10/29/19 1245  metroNIDAZOLE (FLAGYL) IVPB 500 mg     500 mg 100 mL/hr over 60 Minutes Intravenous  Once 10/29/19 1243 10/29/19 1406   10/29/19 1245  vancomycin (VANCOCIN) IVPB 1000 mg/200 mL premix     1,000 mg 200 mL/hr over 60 Minutes Intravenous  Once 10/29/19 1243 10/29/19 1445      Assessment: 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 No DOAC noted PTA. H&H trending down, platelets are elevated at baseline and stable  Heparin Course: 0527 initiation: 4500 unit bolus, then 1200 units/hr 0528 0305 HL 0.18: 2000 unit bolus, then inc to 1400 units/hr 0528 1126 HL 0.16: 2000 unit  bolus, then inc to 1700 units/hr 0528 2141 HL 0.21: SUBtherapeutic.  2000 unit bolus then inc to 2000 units/hr 5/29 @0622  HL=0.43. therapeutic: continue heparin infusion at 2000 units/hr.   Goal of Therapy:  Heparin level 0.3-0.7 units/ml Monitor platelets by anticoagulation protocol: Yes   Plan:   5/30 @0459  HL=0.18. SUBtherapeutic. Will increase Heparin drip to 2300 units/hr.  Check  anti-Xa level in 6 hours   Continue to monitor H&H and platelets  Ena Dawley, PharmD 11/02/2019,6:12 AM

## 2019-11-02 NOTE — Progress Notes (Signed)
Bedside bronchoscopy completed with Dr. Lanney Gins. Timeout was performed at 1730.  Bronchoscope was inserted at 1732 and withdrawn at 1743.  Specimens were obtained and sent the the lab at [facility].  Bronchoscope [id number] was used for the procedure.

## 2019-11-02 NOTE — Procedures (Signed)
FLEXIBLE BRONCHOSCOPY PROCEDURE NOTE    Flexible bronchoscopy was performed on 11/02/19 by : Lanney Gins MD  assistance by : 1)Stacey RT  and 2)Tammey RN   Indication for the procedure was :  Pre-procedural H&P. The following assessment was performed on the day of the procedure prior to initiating sedation History:  Chest pain n Dyspnea y Hemoptysis n Cough y Fever n Other pertinent items y  Examination Vital signs -reviewed as per nursing documentation today Cardiac    Murmurs: n  Rubs : n  Gallop: n Lungs Wheezing: y Rales : y Rhonchi :y  Other pertinent findings: y   Pre-procedural assessment for Procedural Sedation included: Depth of sedation: As per anesthesia team  ASA Classification:  3 Mallampati airway assessment: 4    Medication list reviewed: y  The patient's interval history was taken and revealed: no new complaints The pre- procedure physical examination revealed: No new findings Refer to prior clinic note for details.  Informed Consent: Informed consent was obtained from:  patient after explanation of procedure and risks, benefits, as well as alternative procedures available.  Explanation of level of sedation and possible transfusion was also provided.    Procedural Preparation: Time out was performed and patient was identified by name and birthdate and procedure to be performed and side for sampling, if any, was specified. Pt was intubated by anesthesia.  The patient was appropriately draped.  Procedure Findings: Bronchoscope was inserted via ETT  without difficulty.  Posterior oropharynx, epiglottis, arytenoids, false cords and vocal cords were not visualized as these were bypassed by endotracheal tube.   The distal trachea was normal in circumference and appearance without mucosal, cartilaginous or branching abnormalities.  The main carina was splayed .   All right and left lobar airways were visualized to the Subsegmental level.  Sub- sub  segmental carinae were identified in all the distal airways.   Secretions were visible in the following airways and appeared to be phelgm.  The mucosa was : nonfriable  Airways were notable for:        exophytic lesions :none        extrinsic compression in the following distributions: none.       Friable mucosa: none       Anthrocotic material /pigmentation: none   Pictorial documentation attached: no  Airway inspection with bronchoalveolar lavage as well as removal of mucous plugging was performed.  Phlegm was suctioned out of bilateral airways down to segmental level.  Bronchoalveolar lavage x2 was performed at the right middle lobe.  Mucous plug was sent for microbiology as well as BAL specimens.  No complications, patient tolerated procedure well.    Specimens obtained included:   Broncho-alveolar lavage site:RML  sent for microbiology                             119m volume infused 841mvolume returned with mucoid material in serosanguineous background appearance  Supplemental oxygen was provided via mechanical ventilation by nasal canula post operatively    Estimated Blood loss: 0cc.  Complications included:  0  Preliminary CXR findings :  0  Disposition: patient remains in MICU  Follow up with Dr. AlLanney Ginsin 5 days for result discussion.   FrClaudette StaplerD  [facility]

## 2019-11-03 ENCOUNTER — Encounter: Payer: Self-pay | Admitting: Internal Medicine

## 2019-11-03 ENCOUNTER — Inpatient Hospital Stay: Payer: Medicaid Other

## 2019-11-03 DIAGNOSIS — D649 Anemia, unspecified: Secondary | ICD-10-CM

## 2019-11-03 DIAGNOSIS — D735 Infarction of spleen: Secondary | ICD-10-CM

## 2019-11-03 DIAGNOSIS — R590 Localized enlarged lymph nodes: Secondary | ICD-10-CM

## 2019-11-03 LAB — CBC WITH DIFFERENTIAL/PLATELET
Abs Immature Granulocytes: 0.76 10*3/uL — ABNORMAL HIGH (ref 0.00–0.07)
Basophils Absolute: 0.1 10*3/uL (ref 0.0–0.1)
Basophils Relative: 0 %
Eosinophils Absolute: 0.3 10*3/uL (ref 0.0–0.5)
Eosinophils Relative: 1 %
HCT: 32.9 % — ABNORMAL LOW (ref 36.0–46.0)
Hemoglobin: 10.6 g/dL — ABNORMAL LOW (ref 12.0–15.0)
Immature Granulocytes: 2 %
Lymphocytes Relative: 18 %
Lymphs Abs: 8.7 10*3/uL — ABNORMAL HIGH (ref 0.7–4.0)
MCH: 25.5 pg — ABNORMAL LOW (ref 26.0–34.0)
MCHC: 32.2 g/dL (ref 30.0–36.0)
MCV: 79.3 fL — ABNORMAL LOW (ref 80.0–100.0)
Monocytes Absolute: 3.2 10*3/uL — ABNORMAL HIGH (ref 0.1–1.0)
Monocytes Relative: 7 %
Neutro Abs: 35.9 10*3/uL — ABNORMAL HIGH (ref 1.7–7.7)
Neutrophils Relative %: 72 %
Platelets: 610 10*3/uL — ABNORMAL HIGH (ref 150–400)
RBC: 4.15 MIL/uL (ref 3.87–5.11)
RDW: 16.7 % — ABNORMAL HIGH (ref 11.5–15.5)
Smear Review: NORMAL
WBC: 49 10*3/uL — ABNORMAL HIGH (ref 4.0–10.5)
nRBC: 0.1 % (ref 0.0–0.2)

## 2019-11-03 LAB — COMPREHENSIVE METABOLIC PANEL
ALT: 10 U/L (ref 0–44)
AST: 15 U/L (ref 15–41)
Albumin: 2.9 g/dL — ABNORMAL LOW (ref 3.5–5.0)
Alkaline Phosphatase: 68 U/L (ref 38–126)
Anion gap: 9 (ref 5–15)
BUN: 26 mg/dL — ABNORMAL HIGH (ref 6–20)
CO2: 30 mmol/L (ref 22–32)
Calcium: 8.5 mg/dL — ABNORMAL LOW (ref 8.9–10.3)
Chloride: 104 mmol/L (ref 98–111)
Creatinine, Ser: 0.82 mg/dL (ref 0.44–1.00)
GFR calc Af Amer: >60 mL/min (ref >60)
GFR calc non Af Amer: >60 mL/min (ref >60)
Glucose, Bld: 94 mg/dL (ref 70–99)
Potassium: 4 mmol/L (ref 3.5–5.1)
Sodium: 143 mmol/L (ref 135–145)
Total Bilirubin: 1.6 mg/dL — ABNORMAL HIGH (ref 0.3–1.2)
Total Protein: 7.2 g/dL (ref 6.5–8.1)

## 2019-11-03 LAB — CHLAMYDIA/NGC RT PCR (ARMC ONLY)
Chlamydia Tr: DETECTED — AB
N gonorrhoeae: NOT DETECTED

## 2019-11-03 LAB — GLUCOSE, CAPILLARY
Glucose-Capillary: 70 mg/dL (ref 70–99)
Glucose-Capillary: 54 mg/dL — ABNORMAL LOW (ref 70–99)
Glucose-Capillary: 64 mg/dL — ABNORMAL LOW (ref 70–99)
Glucose-Capillary: 86 mg/dL (ref 70–99)
Glucose-Capillary: 69 mg/dL — ABNORMAL LOW (ref 70–99)
Glucose-Capillary: 85 mg/dL (ref 70–99)

## 2019-11-03 LAB — HEPARIN LEVEL (UNFRACTIONATED): Heparin Unfractionated: 0.46 IU/mL (ref 0.30–0.70)

## 2019-11-03 LAB — RESPIRATORY PANEL BY PCR

## 2019-11-03 LAB — KOH PREP: Special Requests: NORMAL

## 2019-11-03 LAB — WET PREP, GENITAL
Clue Cells Wet Prep HPF POC: NONE SEEN
Sperm: NONE SEEN
Trich, Wet Prep: NONE SEEN
Yeast Wet Prep HPF POC: NONE SEEN

## 2019-11-03 LAB — CULTURE, BLOOD (ROUTINE X 2)
Culture: NO GROWTH
Special Requests: ADEQUATE

## 2019-11-03 LAB — PROCALCITONIN: Procalcitonin: 1.5 ng/mL

## 2019-11-03 LAB — HCG, QUANTITATIVE, PREGNANCY: hCG, Beta Chain, Quant, S: 1 m[IU]/mL (ref <5)

## 2019-11-03 LAB — MAGNESIUM: Magnesium: 2.3 mg/dL (ref 1.7–2.4)

## 2019-11-03 LAB — HIV ANTIBODY (ROUTINE TESTING W REFLEX): HIV Screen 4th Generation wRfx: NONREACTIVE

## 2019-11-03 LAB — PHOSPHORUS: Phosphorus: 4.2 mg/dL (ref 2.5–4.6)

## 2019-11-03 MED ORDER — MIDAZOLAM HCL 2 MG/2ML IJ SOLN
1.0000 mg | INTRAMUSCULAR | Status: DC | PRN
  Administered 2019-11-03 – 2019-11-04 (×4): 2 mg via INTRAVENOUS
  Filled 2019-11-03 (×5): qty 2

## 2019-11-03 MED ORDER — VITAL HIGH PROTEIN PO LIQD
1000.0000 mL | ORAL | Status: DC
  Administered 2019-11-03: 1000 mL

## 2019-11-03 MED ORDER — DEXTROSE 5 % IV SOLN: 1000.0000 mg | Freq: Once | INTRAVENOUS | Status: DC

## 2019-11-03 MED ORDER — SODIUM CHLORIDE 0.9 % IV SOLN
500.0000 mg | Freq: Once | INTRAVENOUS | Status: AC
  Administered 2019-11-03: 500 mg via INTRAVENOUS
  Filled 2019-11-03: qty 500

## 2019-11-03 MED ORDER — DEXTROSE 10 % IV SOLN: INTRAVENOUS | Status: DC

## 2019-11-03 MED ORDER — FLUCONAZOLE 50 MG PO TABS
150.0000 mg | ORAL_TABLET | Freq: Once | ORAL | Status: DC
  Filled 2019-11-03: qty 1

## 2019-11-03 MED ORDER — SODIUM CHLORIDE 0.9 % IV SOLN
500.0000 mg | Freq: Once | INTRAVENOUS | Status: AC
  Administered 2019-11-03: 500 mg via INTRAVENOUS
  Filled 2019-11-03 (×2): qty 500

## 2019-11-03 MED ORDER — FLUCONAZOLE IN SODIUM CHLORIDE 400-0.9 MG/200ML-% IV SOLN
400.0000 mg | INTRAVENOUS | Status: DC
  Filled 2019-11-03: qty 200

## 2019-11-03 MED ORDER — HYDROCORTISONE NA SUCCINATE PF 100 MG IJ SOLR
50.0000 mg | Freq: Four times a day (QID) | INTRAMUSCULAR | Status: DC
  Administered 2019-11-03 – 2019-11-04 (×5): 50 mg via INTRAVENOUS
  Filled 2019-11-03 (×5): qty 2

## 2019-11-03 MED ORDER — ACETAMINOPHEN 10 MG/ML IV SOLN
1000.0000 mg | INTRAVENOUS | Status: DC | PRN
  Administered 2019-11-03 (×3): 1000 mg via INTRAVENOUS
  Filled 2019-11-03 (×3): qty 100

## 2019-11-03 MED ORDER — ACETAMINOPHEN 10 MG/ML IV SOLN
1000.0000 mg | Freq: Four times a day (QID) | INTRAVENOUS | Status: AC
  Administered 2019-11-04 (×2): 1000 mg via INTRAVENOUS
  Filled 2019-11-03 (×2): qty 100

## 2019-11-03 MED ORDER — IOHEXOL 350 MG/ML SOLN
100.0000 mL | Freq: Once | INTRAVENOUS | Status: AC | PRN
  Administered 2019-11-04: 100 mL via INTRAVENOUS

## 2019-11-03 MED ORDER — FLUCONAZOLE IN SODIUM CHLORIDE 400-0.9 MG/200ML-% IV SOLN
800.0000 mg | Freq: Once | INTRAVENOUS | Status: AC
  Administered 2019-11-04: 800 mg via INTRAVENOUS
  Filled 2019-11-03: qty 400

## 2019-11-03 NOTE — Progress Notes (Signed)
Patient continues with elevated rectal temperature of 102.9-103.0.  Dr. Lanney Gins notified and verbal order obtained to change PO tylenol to IV tylenol.  Dr. Lanney Gins verbalized that he does not want Ibuprofen to be administered at this time for elevated temperature.

## 2019-11-03 NOTE — Progress Notes (Signed)
Notified Dr. Lanney Gins about being unable to doppler right radial pulse.  Ultrasound ordered.  Patient with foul smelling discharge and per family has had recent miscarriage. Inquired about OBGYN consult.  Dr. Lanney Gins verbalized he would place consult.

## 2019-11-03 NOTE — Progress Notes (Signed)
Dr. Lanney Gins notified of continued elevated temperature after multiple doses of IV tylenol.  Dr. Lanney Gins acknowledged information but no order obtained.

## 2019-11-03 NOTE — Progress Notes (Signed)
Tylenol 1000mg  IV given for temperature of 102.9.

## 2019-11-03 NOTE — Progress Notes (Signed)
Dr. Leafy Ro notified of vaginal bacterial results.  Orders to be placed for appropriative medications.

## 2019-11-03 NOTE — Progress Notes (Signed)
Versed given for RASS 2.

## 2019-11-03 NOTE — Progress Notes (Signed)
Initial Nutrition Assessment  DOCUMENTATION CODES:   Morbid obesity  INTERVENTION:  Initiate Vital High Protein at 20 mL/hr and advance by 20 mL/hr every 8 hours to goal rate of 60 mL/hr (1440 mL goal daily volume). Provides 1440 kcal, 126 grams of protein, 1210 mL H2O daily.  Goal TF regimen meets 100% RDIs for vitamins/minerals.  Provide minimum free water flush of 20-30 mL Q4hrs to maintain tube patency.  Monitor magnesium, potassium, and phosphorus daily for at least 3 days, MD to replete as needed, as pt is at risk for refeeding syndrome given prolonged NPO status.  NUTRITION DIAGNOSIS:   Inadequate oral intake related to inability to eat as evidenced by NPO status.  GOAL:   Patient will meet greater than or equal to 90% of their needs  MONITOR:   Vent status, Labs, Weight trends, I & O's  REASON FOR ASSESSMENT:   Ventilator    ASSESSMENT:   24 year old female with PMHx of hx admission to [facility] 04/2018 with septic shock, severe metabolic acidosis, and multifocal PNA requiring VV-ECMO, COVID-19 04/2019, rhinovirus LRTI 09/2019, recurrent PNA now admitted with severe acute respiratory failure from PNA and ARDS, acute diastolic cardiac failure. Patient was intubated on 5/30 and underwent bronchoscopy also on 5/30 for bronchoalveolar lavage and removal of mucous plugging.   Patient is currently intubated on ventilator support MV: 8.9 L/min Temp (24hrs), Avg:102.7 F (39.3 C), Min:98.9 F (37.2 C), Max:103.6 F (39.8 C)  Medications reviewed and include: Lasix 40 mg BID IV, cefepime, Precedex gtt, famotidine, fentanyl gtt, Flagyl, phenylephrine gtt at 60 mcg/min, vancomycin.  Labs reviewed: BUN 26.  I/O: 3000 mL UOP yesterday (1.1 mL/kg/hr)  Enteral Access: 16 Fr. OGT; terminates in distal stomach per abdominal x-ray 5/30  Patient does not meet criteria for malnutrition at this time. Patient has been NPO since admission 5 days.  Discussed with MD. Plan is to  initiate tube feeds today.  NUTRITION - FOCUSED PHYSICAL EXAM:    Most Recent Value  Orbital Region  No depletion  Upper Arm Region  No depletion  Thoracic and Lumbar Region  No depletion  Buccal Region  Unable to assess  Temple Region  No depletion  Clavicle Bone Region  No depletion  Clavicle and Acromion Bone Region  No depletion  Scapular Bone Region  Unable to assess  Dorsal Hand  No depletion  Patellar Region  No depletion  Anterior Thigh Region  No depletion  Posterior Calf Region  No depletion  Edema (RD Assessment)  Mild  Hair  Reviewed  Eyes  Unable to assess  Mouth  Unable to assess  Skin  Reviewed  Nails  Reviewed     Diet Order:   Diet Order            Diet NPO time specified  Diet effective now             EDUCATION NEEDS:   No education needs have been identified at this time  Skin:  Skin Assessment: Reviewed RN Assessment  Last BM:  10/31/2019  Height:   Ht Readings from Last 1 Encounters:  10/29/19 _0  (1.549 m)   Weight:   Wt Readings from Last 1 Encounters:  11/03/19 113.3 kg   Ideal Body Weight:  47.7 kg  BMI:  Body mass index is 47.2 kg/m.  Estimated Nutritional Needs:   Kcal:  1246-1586 (11-14 kcal/kg)  Protein:  119 grams (2.5 grams/kg IBW)  Fluid:  1.5 L/day  Jacklynn Barnacle,  MS, RD, LDN Pager number available on Amion

## 2019-11-03 NOTE — Consult Note (Signed)
Pharmacy Antibiotic Note  Yesenia Carrillo is a 24 y.o. female admitted on 10/29/2019 with pneumonia.  Pharmacy has been consulted for Vancomycin and Cefepime dosing. She is noted to have had multiple recent admissions for ARDS. This is day  # 6 of broad-spectrum IV antibiotics.  ID consulted.   Plan:  1) continue vancomycin dose to 1000 mg IV Q 12 hrs Goal AUC 400-550 Expected AUC: 482 Expected Css: 30.2/13.5 mcg/mL T 1/2: 9.4h SCr used: 0.8 (actual 0.63)  Vanc trough ordered for 2130 this evening.   2) continue cefepime 2g IV Q8 hours   Height: 5\' 1"  (154.9 cm) Weight: 113.3 kg (249 lb 12.5 oz) IBW/kg (Calculated) : 47.8  Temp (24hrs), Avg:102.8 F (39.3 C), Min:98.9 F (37.2 C), Max:103.6 F (39.8 C)  Recent Labs  Lab 10/29/19 1158 10/29/19 1442 10/29/19 1621 10/30/19 0437 10/31/19 0305 10/31/19 0306 11/01/19 0622 11/02/19 0459 11/03/19 0545  WBC 33.9*  --    < > 34.5* 52.0*  51.7*  --  53.5* 54.4* 49.0*  CREATININE 0.56  --    < > 0.54  --  0.63 0.57 0.44 0.82  LATICACIDVEN 2.4* 1.4  --   --   --   --   --   --   --    < > = values in this interval not displayed.    Estimated Creatinine Clearance: 124.6 mL/min (by C-G formula based on SCr of 0.82 mg/dL).    Allergies  Allergen Reactions  . Ampicillin Rash and Other (See Comments)    Patient and family cannot remember the specifics of the reaction.  Marland Kitchen Penicillins Hives    Antimicrobials this admission: Vancomycin 5/26 >>  Cefepime 5/26 >>  Metronidazole 5/26 >>  Microbiology results: 5/26 BCx: pending 5/27 MRSA PCR: negative 5/26 SARS CoV-2: negative  Thank you for allowing pharmacy to be a part of this patient's care.  Pernell Dupre, PharmD, BCPS Clinical Pharmacist 11/03/2019 3:22 PM

## 2019-11-03 NOTE — Progress Notes (Signed)
Updated Olin Hauser with [facility] transfer center. No bed at this time patient on waiting list.

## 2019-11-03 NOTE — Progress Notes (Signed)
CRITICAL CARE NOTE  24 yo morbidly obese AAF with previous h/o COVID 90, last month 09/2019 patient had rhinovirus LRTI.  She reports spontaneous pneumonia recurrently. She had admission to [facility] 04/2018 with septic shock , severe metabolic acidosis and multifocal pneumonia placed on vanc/cefepime and eventually required VV-ECMO cannulation while on mechanical ventilation.  She had left femoral vein trauma during this and had to have surgical repair of femoral vein.She was seen by ID and coverage was broadened to vanc/zithromax/fluconazole/meropenem.  Patient then had Left segmental PE likely provoked from ECMO and completed 3 months of eliquis.  Patient comes via EMS. EMS reportslower abdominal pain that radiates to her back. Pt states this started yesterday. Pt states tenderness to belly.Patient denies any N/V. Pt states dizziness and feels like she is going to pass out. Patient appears in distress and diaphoretic on presentation to MICU.   11/02/19- patient is continues to be severely hypoxemic on 60% HFNC elevated setting with unsucsessful trial of weaning O2 few days in a row already. I discussed case with mother today she does not know what is the diagnosis patient has despite repeated admissions to hospital and repeated abnormal CT chest. They have asked for workup to be done on [facility] however we have not been able to secure transfer due to bed shortages there.  [facility] has declined to accept her.  I have explained to patient that she is very tachypneic and tachycardic and high risk for death. Family has asked for elective endotracheal intubation.  11/03/19- patient remains critically ill, BAL specimens are pending, s/p ID and OBGYN evaluation   CC  follow up respiratory failure  SUBJECTIVE Patient remains critically ill Prognosis is guarded Alert and awake Severe hypoxia High risk for intubation   BP 109/62   Pulse 91   Temp (!) 102.9 F (39.4 C) (Rectal)   Resp 20   Ht _0  (1.549 m)   Wt  113.3 kg   SpO2 96%   BMI 47.20 kg/m    I/O last 3 completed shifts: In: 7619 [I.V.:1891.4; IV Piggyback:1757.6] Out: 4000 [Urine:4000] Total I/O In: 1410.2 [I.V.:759.9; IV Piggyback:650.4] Out: 600 [Urine:600]  SpO2: 96 % O2 Flow Rate (L/min): 0 L/min FiO2 (%): 55 %  Estimated body mass index is 47.2 kg/m as calculated from the following:   Height as of this encounter: _1  (1.549 m).   Weight as of this encounter: 113.3 kg.   Review of Systems:  Gen:  Denies  fever, sweats, chills weight loss  HEENT: Denies blurred vision, double vision, ear pain, eye pain, hearing loss, nose bleeds, sore throat Cardiac:  +heaviness ABD +pain improved  Resp:  +SOB same as previous Other:  All other systems negative       PHYSICAL EXAMINATION:  GENERAL:obese age appropriate , sedated on Vent HEAD: Normocephalic, atraumatic.  EYES: Pupils equal, round, reactive to light.  No scleral icterus.  MOUTH: Moist mucosal membrane. NECK: Supple.  PULMONARY: mild rhonchi bilaterally  CARDIOVASCULAR: S1 and S2. Regular rate and rhythm. No murmurs, rubs, or gallops.  GASTROINTESTINAL: +tender, +distended.  Positive bowel sounds.   MUSCULOSKELETAL: No swelling, clubbing, or edema.  NEUROLOGIC:GCS3T on MV with sedation  SKIN:intact,warm,dry  MEDICATIONS: I have reviewed all medications and confirmed regimen as documented   CULTURE RESULTS   Recent Results (from the past 240 hour(s))  Blood culture (routine x 2)     Status: None   Collection Time: 10/29/19 12:39 PM   Specimen: BLOOD  Result Value Ref Range  Status   Specimen Description BLOOD LEFT ANTECUBITAL  Final   Special Requests   Final    BOTTLES DRAWN AEROBIC AND ANAEROBIC Blood Culture adequate volume   Culture   Final    NO GROWTH 5 DAYS Performed at [facility], [address] [place], [place] [address]    Report Status 11/03/2019 FINAL  Final  SARS Coronavirus 2 by RT PCR (hospital order, performed in [facility] hospital lab) Nasopharyngeal Nasopharyngeal Swab     Status: None   Collection Time: 10/29/19 12:39 PM   Specimen: Nasopharyngeal Swab  Result Value Ref Range Status   SARS Coronavirus 2 NEGATIVE NEGATIVE Final    Comment: (NOTE) SARS-CoV-2 target nucleic acids are NOT DETECTED. The SARS-CoV-2 RNA is generally detectable in upper and lower respiratory specimens during the acute phase of infection. The lowest concentration of SARS-CoV-2 viral copies this assay can detect is 250 copies / mL. A negative result does not preclude SARS-CoV-2 infection and should not be used as the sole basis for treatment or other patient management decisions.  A negative result may occur with improper specimen collection / handling, submission of specimen other than nasopharyngeal swab, presence of viral mutation(s) within the areas targeted by this assay, and inadequate number of viral copies (<250 copies / mL). A negative result must be combined with clinical observations, patient history, and epidemiological information. Fact Sheet for Patients:   StrictlyIdeas.no Fact Sheet for Healthcare Providers: BankingDealers.co.za This test is not yet approved or cleared  by the [place] FDA and has been authorized for detection and/or diagnosis of SARS-CoV-2 by FDA under an Emergency Use Authorization (EUA).  This EUA will remain in effect (meaning this test can be used) for the duration of the COVID-19 declaration under Section 564(b)(1) of the Act, 21 U.S.C. section 360bbb-3(b)(1), unless the authorization is terminated or revoked sooner. Performed at [facility], [address] [place], [place] [address]   MRSA PCR Screening     Status: None   Collection Time: 10/30/19  8:27 AM   Specimen: Nasal Mucosa; Nasopharyngeal  Result Value Ref Range Status   MRSA by PCR NEGATIVE NEGATIVE Final    Comment:        The GeneXpert MRSA Assay  (FDA approved for NASAL specimens only), is one component of a comprehensive MRSA colonization surveillance program. It is not intended to diagnose MRSA infection nor to guide or monitor treatment for MRSA infections. Performed at [facility], [address] [place], [place] [address]   Blood culture (routine x 2)     Status: None (Preliminary result)   Collection Time: 10/30/19  9:05 PM   Specimen: BLOOD LEFT FOREARM  Result Value Ref Range Status   Specimen Description BLOOD LEFT FOREARM  Final   Special Requests   Final    BOTTLES DRAWN AEROBIC AND ANAEROBIC Blood Culture results may not be optimal due to an excessive volume of blood received in culture bottles   Culture   Final    NO GROWTH 4 DAYS Performed at [facility], [address], [place], [place] [address]    Report Status PENDING  Incomplete  Culture, bal-quantitative     Status: None (Preliminary result)   Collection Time: 11/02/19  6:04 PM   Specimen: Bronchoalveolar Lavage; Respiratory  Result Value Ref Range Status   Specimen Description BRONCHIAL ALVEOLAR LAVAGE  Final   Special Requests NONE  Final   Gram Stain   Final  RARE WBC PRESENT, PREDOMINANTLY PMN NO ORGANISMS SEEN Performed at [place] [address], [place], [place] [address]    Culture PENDING  Incomplete   Report Status PENDING  Incomplete  KOH prep     Status: None   Collection Time: 11/02/19  6:04 PM   Specimen: Bronchoalveolar Lavage  Result Value Ref Range Status   Specimen Description BRONCHIAL ALVEOLAR LAVAGE  Final   Special Requests Normal  Final   KOH Prep   Final    BUDDING YEAST SEEN Performed at [facility], [address], [place], [place] [address]    Report Status 11/03/2019 FINAL  Final  Culture, respiratory (non-expectorated)     Status: None (Preliminary result)   Collection Time: 11/02/19  6:04 PM   Specimen: Bronchoalveolar Lavage; Respiratory  Result Value Ref Range  Status   Specimen Description   Final    BRONCHIAL ALVEOLAR LAVAGE Performed at [facility], [address] [place], [place] [address]    Special Requests   Final    Normal Performed at [facility], [address] [place], [place] [address]    Gram Stain   Final    FEW WBC PRESENT, PREDOMINANTLY MONONUCLEAR NO ORGANISMS SEEN Performed at [facility], [place] [address], [place], [place] [address]    Culture PENDING  Incomplete   Report Status PENDING  Incomplete  Respiratory Panel by PCR     Status: None   Collection Time: 11/02/19  6:04 PM   Specimen: Nasopharyngeal Swab; Respiratory  Result Value Ref Range Status   Adenovirus NOT DETECTED NOT DETECTED Final   Coronavirus 229E NOT DETECTED NOT DETECTED Final    Comment: (NOTE) The Coronavirus on the Respiratory Panel, DOES NOT test for the novel  Coronavirus (2019 nCoV)    Coronavirus HKU1 NOT DETECTED NOT DETECTED Final   Coronavirus NL63 NOT DETECTED NOT DETECTED Final   Coronavirus OC43 NOT DETECTED NOT DETECTED Final   Metapneumovirus NOT DETECTED NOT DETECTED Final   Rhinovirus / Enterovirus NOT DETECTED NOT DETECTED Final   Influenza A NOT DETECTED NOT DETECTED Final   Influenza B NOT DETECTED NOT DETECTED Final   Parainfluenza Virus 1 NOT DETECTED NOT DETECTED Final   Parainfluenza Virus 2 NOT DETECTED NOT DETECTED Final   Parainfluenza Virus 3 NOT DETECTED NOT DETECTED Final   Parainfluenza Virus 4 NOT DETECTED NOT DETECTED Final   Respiratory Syncytial Virus NOT DETECTED NOT DETECTED Final   Bordetella pertussis NOT DETECTED NOT DETECTED Final   Chlamydophila pneumoniae NOT DETECTED NOT DETECTED Final   Mycoplasma pneumoniae NOT DETECTED NOT DETECTED Final    Comment: Performed at [facility], [place]. [address] [place], [place] [address]  Wet prep, genital     Status: Abnormal   Collection Time: 11/03/19  4:29 PM   Specimen: Genital  Result Value Ref Range Status   Yeast Wet Prep  HPF POC NONE SEEN NONE SEEN Final   Trich, Wet Prep NONE SEEN NONE SEEN Final   Clue Cells Wet Prep HPF POC NONE SEEN NONE SEEN Final   WBC, Wet Prep HPF POC MODERATE (A) NONE SEEN Final   Sperm NONE SEEN  Final    Comment: Performed at [facility], [address], [place], [place] [address]          IMAGING    US PELVIS (TRANSABDOMINAL ONLY)  Result Date: 11/03/2019 CLINICAL DATA:  Sepsis.  Recent miscarriage. EXAM: TRANSABDOMINAL ULTRASOUND OF PELVIS  TECHNIQUE: Transabdominal ultrasound examination of the pelvis was performed including evaluation of the uterus, ovaries, adnexal regions, and pelvic cul-de-sac. COMPARISON:  06/06/2013 FINDINGS: Uterus Measurements: 6.9 x 3.1 x 3.4 cm = volume: 38 mL. No fibroids or other mass visualized. Endometrium Thickness: 8 mm measured transabdominally. No mass, fluid collection, or other focal abnormality visualized. Right ovary Measurements: 2.7 x 1.5 x 1.8 cm = volume: 4.0 mL. Normal appearance/no adnexal mass. Left ovary Measurements: 3.1 x 1.6 x 1.8 cm = volume: 4.0 mL. Normal appearance/no adnexal mass. Other findings:  No abnormal free fluid. IMPRESSION: Normal appearance of uterus and both ovaries. No evidence of pelvic mass or fluid collections. Electronically Signed   By: Marlaine Hind M.D.   On: 11/03/2019 14:43   DG Chest Port 1 View  Result Date: 11/02/2019 CLINICAL DATA:  Encounter for central line EXAM: PORTABLE CHEST 1 VIEW COMPARISON:  Chest radiograph 11/02/2019 FINDINGS: Endotracheal tube is appropriately position between the thoracic inlet and carina. Interval placement of a right central venous catheter with tip projecting at the cavoatrial junction. A nasogastric tube remains in place. Stable cardiomediastinal contours. Low lung volumes with bilateral airspace opacities. No pneumothorax or large pleural effusion. IMPRESSION: 1. Interval placement of a right central venous catheter with tip projecting at the cavoatrial  junction. 2. Appropriate positioning of the endotracheal tube. 3. Low lung volumes and bilateral airspace opacities. Electronically Signed   By: Audie Pinto M.D.   On: 11/02/2019 21:27     Nutrition Status: Nutrition Problem: Inadequate oral intake Etiology: inability to eat Signs/Symptoms: NPO status Interventions: Tube feeding    ASSESSMENT AND PLAN SYNOPSIS   Severe ACUTE Hypoxic and Hypercapnic Respiratory Failure from pneumonia and ARDS s/p COVID  -previous h/o ARDS s/p ECMO-04/2018 -High risk for intubation -High risk for cardiac arrest -reviewed serial CT chest- patient has had hilar and mediastinal lymphadenopathy for at least 5 years as evidenced above -may have vasculitic component -awaiting transfer to [facility] per request of family  -EBV-IgG titers >too high to read -patient had WBC count >77K in 2019 during similar episode-[facility] records -Urine cultures with >100,000 colonies Klebsiella - 09/2019- [facility] records -Immune workup including immunoglobulins, ANA, ANCA  All negative -09/2019- [facility] records -Bronchoscopy with BAL - +yeast non crypto, negative PCP, negative histoplasma, negative KOH, negative AFB - 09/2019- [facility] records -all blood cultures have been negative at [facility] -septic workup negative thus far -ID and OBGYN evaluation today    ACUTE DIASTOLIC CARDIAC FAILURE-  -oxygen as needed -Lasix as tolerated   Morbid obesity, possible OSA.   Will certainly impact respiratory mechanics,   CARDIAC ICU monitoring  ID -continue IV abx as prescibed -follow up cultures  GI GI PROPHYLAXIS as indicated  NUTRITIONAL STATUS Nutrition Status: Nutrition Problem: Inadequate oral intake Etiology: inability to eat Signs/Symptoms: NPO status Interventions: Tube feeding   DIET--> as tolerated Constipation protocol as indicated  ENDO - will use ICU hypoglycemic\Hyperglycemia protocol if indicated     ELECTROLYTES -follow labs as needed -replace as  needed -pharmacy consultation and following   DVT/GI PRX ordered and assessed TRANSFUSIONS AS NEEDED MONITOR FSBS    CASE DISCUSSED IN [place] WITH ICU TEAM  Critical care provider statement:    Critical care time (minutes):  33   Critical care time was exclusive of:  Separately billable procedures and  treating other patients   Critical care was necessary to treat or prevent imminent or  life-threatening deterioration of the following conditions:  Acute hypoxemic respiratory failure,  bulky hilar/mediastinal lymphadenopathy, OSA, morbid obesity   Critical care was time spent personally by me on the following  activities:  Development of treatment plan with patient or surrogate,  discussions with consultants, evaluation of patient's response to  treatment, examination of patient, obtaining history from patient or  surrogate, ordering and performing treatments and interventions, ordering  and review of laboratory studies and re-evaluation of patient's condition   I assumed direction of critical care for this patient from another  provider in my specialty: no     Critical Care Time devoted to patient care services described in this note is 34 minutes.   Overall, patient is critically ill, prognosis is guarded.   high risk for cardiac arrest and death.   Transfer to [facility] pending   Ottie Glazier, M.D.  Pulmonary & [place]

## 2019-11-03 NOTE — Progress Notes (Signed)
Dr. Leafy Ro at bedside for vaginal exam.

## 2019-11-03 NOTE — Consult Note (Signed)
NAME: Yesenia Carrillo  DOB: [birth year]  MRN: [id number]  Date/Time: 11/03/2019 1:14 PM  REQUESTING PROVIDER: Deniece Portela Subjective:  REASON FOR CONSULT: Recurrent pneumonia ?No history available from patient as she is intubated Chart reviewed in detail [facility] records reviewed Spoke to her mother and grandmother Yesenia Carrillo is a 24 y.o. female with a complicated medical history with recurrent pneumonia, ARDS, sickle cell trait is admitted to the hospital on 10/29/2019 with sudden onset pain in her abdomen. In the ED she also was complaining of shortness of breath.  She was also having dizziness.  She had a miscarriage about 4 months ago. Her vitals were pulse rate of 139, respiratory rate of 30, temperature of 99.3, blood pressure of 167/119. CT chest revealed bilateral infiltrates. She was seen by intensivist and was started on IV steroids and IV antibiotics and was admitted to ICU.  She was requiring 60% high flow nasal cannula oxygen and was continuing to be hypoxemic requiring intubation on 11/02/2019. She is currently on vancomycin, cefepime and Flagyl and I am asked to see the patient. Patient has complicated history.  She has had acute hypoxic respiratory failure at least 4 times in the past and was intubated 3 times before.Marland Kitchen  She was recently hospitalized at [facility] between 09/08/2019 until 09/20/2019 with acute hypoxic respiratory failure was intubated and the respiratory viral PCR panel showed rhinovirus and it was thought that was the cause of her symptoms.  After extubation on 09/17/2018 when she was discharged home on 09/20/2019.  And as per her mother she had gotten a new job but had just 1 L recently started work when she got admitted to the hospital. Prior to this she was hospitalized between 05/05/2019 till 05/08/2019 with Covid and got remdesivir, steroids.  She was not intubated during that hospitalization 11/07/2018 until 11/13/2018 she had acute hypoxic respiratory failure/ARDS and was  intubated 05/03/2018 until 05/15/2018 she was intubated for acute hypoxic respiratory failure/ARDS and was placed on ECMO.  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 She has been seen by allergist at [facility] and myoglobulin work-up was normal so was the lymphocyte enumeration studies.  She has negative autoimmune panel.  She is negative for HIV. CT chest reveals bilateral pulmonary infiltrates and mediastinal lymphadenopathy pulmonologist has done bronchoscopy and sent multiple test.  CT abdomen revealed splenic infarcts.  Her grandmother has psoriatic/rheumatoid arthritis and is on methotrexate.  No family history of sarcoidosis or lupus.  Past Medical History:  Diagnosis Date  . COVID-19 04/2019   patient reports diagnosis in Nov 2020  . Hypertension   . Morbid obesity with BMI of 40.0-44.9, adult [facility])     Past Surgical History:  Procedure Laterality Date  . TONSILLECTOMY    . WISDOM TOOTH EXTRACTION      Social History   Socioeconomic History  . Marital status: Single    Spouse name: Not on file  . Number of children: Not on file  . Years of education: Not on file  . Highest education level: Not on file  Occupational History  . Not on file  Tobacco Use  . Smoking status: Current Every Day Smoker    Types: Cigars  . Smokeless tobacco: Never Used  Substance and Sexual Activity  . Alcohol use: Yes    Comment: monthly  . Drug use: No  . Sexual activity: Yes    Birth control/protection: None  Other Topics Concern  . Not on file  Social History Narrative  . Not on file   Social Determinants of Health   Financial Resource Strain:   . Difficulty of Paying Living Expenses:   Food Insecurity:   . Worried About Charity fundraiser in the Last Year:   . Arboriculturist in the Last Year:   Transportation Needs:   . Film/video editor (Medical):   Marland Kitchen Lack of Transportation (Non-Medical):   Physical Activity:   . Days of Exercise  per Week:   . Minutes of Exercise per Session:   Stress:   . Feeling of Stress :   Social Connections:   . Frequency of Communication with Friends and Family:   . Frequency of Social Gatherings with Friends and Family:   . Attends Religious Services:   . Active Member of Clubs or Organizations:   . Attends Archivist Meetings:   Marland Kitchen Marital Status:   Intimate Partner Violence:   . Fear of Current or Ex-Partner:   . Emotionally Abused:   Marland Kitchen Physically Abused:   . Sexually Abused:     No family history on file. Allergies  Allergen Reactions  . Ampicillin Rash and Other (See Comments)    Patient and family cannot remember the specifics of the reaction.  Marland Kitchen Penicillins Hives    ? Current Facility-Administered Medications  Medication Dose Route Frequency Provider Last Rate Last Admin  . 0.9 %  sodium chloride infusion  250 mL Intravenous PRN Flora Lipps, MD 5 mL/hr at 11/03/19 1200 Rate Verify at 11/03/19 1200  . 0.9 %  sodium chloride infusion  250 mL Intravenous Continuous Awilda Bill, NP   Stopped at 11/02/19 2213  . acetaminophen (OFIRMEV) IV 1,000 mg  1,000 mg Intravenous Q4H PRN Ottie Glazier, MD   Stopped at 11/03/19 1024  . budesonide (PULMICORT) nebulizer solution 0.25 mg  0.25 mg Nebulization BID Flora Lipps, MD   0.25 mg at 11/03/19 0813  . ceFEPIme (MAXIPIME) 2 g in sodium chloride 0.9 % 100 mL IVPB  2 g Intravenous Q8H Rito Ehrlich A, RPH   Stopped at 11/03/19 5284  . chlorhexidine gluconate (MEDLINE KIT) (PERIDEX) 0.12 % solution 15 mL  15 mL Mouth Rinse BID Ottie Glazier, MD   15 mL at 11/03/19 0816  . Chlorhexidine Gluconate Cloth 2 % PADS 6 each  6 each Topical Daily Flora Lipps, MD   6 each at 11/03/19 [phone]  . cloNIDine (CATAPRES - Dosed in mg/24 hr) patch 0.1 mg  0.1 mg Transdermal Weekly Ottie Glazier, MD   0.1 mg at 11/01/19 1246  . dexmedetomidine (PRECEDEX) 400 MCG/100ML (4 mcg/mL) infusion  0.4-1.2 mcg/kg/hr Intravenous Titrated Ottie Glazier, MD 32.9 mL/hr at 11/03/19 1235 1.2 mcg/kg/hr at 11/03/19 1235  . docusate sodium (COLACE) capsule 100 mg  100 mg Oral BID PRN Flora Lipps, MD      . famotidine (PEPCID) IVPB 20 mg premix  20 mg Intravenous Q12H Flora Lipps, MD   Stopped at 11/03/19 1022  . feeding supplement (VITAL HIGH PROTEIN) liquid 1,000 mL  1,000 mL Per Tube Q24H Aleskerov, Fuad, MD   1,000 mL at 11/03/19 1227  . fentaNYL 2594mg in NS 2576m(1066mml) infusion-PREMIX  0-400 mcg/hr Intravenous Continuous AleOttie GlazierD 40 mL/hr at 11/03/19 1234 400 mcg/hr at 11/03/19 1234  . fludrocortisone (FLORINEF) tablet 0.1 mg  0.1 mg Oral Daily AleOttie GlazierD   0.1 mg at 11/03/19 0952  . furosemide (LASIX) injection 40 mg  40 mg Intravenous  BID Ottie Glazier, MD   Stopped at 11/02/19 2213  . guaiFENesin-codeine 100-10 MG/5ML solution 5 mL  5 mL Oral Q4H PRN Flora Lipps, MD   5 mL at 10/30/19 1733  . HYDROcodone-acetaminophen (NORCO/VICODIN) 5-325 MG per tablet 1-2 tablet  1-2 tablet Oral Q4H PRN Darel Hong D, NP      . HYDROmorphone (DILAUDID) injection 1 mg  1 mg Intravenous Q2H PRN Darel Hong D, NP   1 mg at 11/02/19 1542  . ipratropium-albuterol (DUONEB) 0.5-2.5 (3) MG/3ML nebulizer solution 3 mL  3 mL Nebulization Q4H Flora Lipps, MD   3 mL at 11/03/19 1117  . MEDLINE mouth rinse  15 mL Mouth Rinse 10 times per day Ottie Glazier, MD   15 mL at 11/03/19 1228  . metroNIDAZOLE (FLAGYL) IVPB 500 mg  500 mg Intravenous Q8H Darel Hong D, NP 100 mL/hr at 11/03/19 1236 500 mg at 11/03/19 1236  . midazolam (VERSED) injection 1-2 mg  1-2 mg Intravenous Q1H PRN Awilda Bill, NP   2 mg at 11/03/19 0814  . ondansetron (ZOFRAN) injection 4 mg  4 mg Intravenous Q6H PRN Flora Lipps, MD   4 mg at 10/30/19 0658  . phenylephrine CONCENTRATED 118m in sodium chloride 0.9% 2547m(0.41m33mL) infusion  0-400 mcg/min Intravenous Titrated BlaAwilda BillP 9 mL/hr at 11/03/19 1200 60 mcg/min at 11/03/19 1200  .  pneumococcal 23 valent vaccine (PNEUMOVAX-23) injection 0.5 mL  0.5 mL Intramuscular Tomorrow-1000 KeeDarel Hong NP      . polyethylene glycol (MIRALAX / GLYCOLAX) packet 17 g  17 g Oral Daily PRN KasFlora LippsD      . rocuronium (ZEMURON) injection 40 mg  40 mg Intravenous Once AleOttie GlazierD      . sodium chloride flush (NS) 0.9 % injection 3 mL  3 mL Intravenous Q12H KasFlora LippsD   3 mL at 11/03/19 0953  . sodium chloride flush (NS) 0.9 % injection 3 mL  3 mL Intravenous PRN KasFlora LippsD      . vancomycin (VANCOCIN) IVPB 1000 mg/200 mL premix  1,000 mg Intravenous Q12H GruDallie PilesPH   Stopped at 11/03/19 1134     Abtx:  Anti-infectives (From admission, onward)   Start     Dose/Rate Route Frequency Ordered Stop   10/31/19 0500  metroNIDAZOLE (FLAGYL) IVPB 500 mg     500 mg 100 mL/hr over 60 Minutes Intravenous Every 8 hours 10/31/19 0421     10/30/19 2200  vancomycin (VANCOCIN) IVPB 1000 mg/200 mL premix     1,000 mg 200 mL/hr over 60 Minutes Intravenous Every 12 hours 10/30/19 1456     10/30/19 1000  vancomycin (VANCOREADY) IVPB 1500 mg/300 mL  Status:  Discontinued     1,500 mg 150 mL/hr over 120 Minutes Intravenous Every 12 hours 10/29/19 2034 10/30/19 1456   10/29/19 2200  ceFEPIme (MAXIPIME) 2 g in sodium chloride 0.9 % 100 mL IVPB     2 g 200 mL/hr over 30 Minutes Intravenous Every 8 hours 10/29/19 2000     10/29/19 2000  vancomycin (VANCOCIN) IVPB 1000 mg/200 mL premix     1,000 mg 200 mL/hr over 60 Minutes Intravenous  Once 10/29/19 1958 10/29/19 2136   10/29/19 1245  ceFEPIme (MAXIPIME) 2 g in sodium chloride 0.9 % 100 mL IVPB     2 g 200 mL/hr over 30 Minutes Intravenous  Once 10/29/19 1243 10/29/19 1332   10/29/19 1245  metroNIDAZOLE (FLAGYL) IVPB  500 mg     500 mg 100 mL/hr over 60 Minutes Intravenous  Once 10/29/19 1243 10/29/19 1406   10/29/19 1245  vancomycin (VANCOCIN) IVPB 1000 mg/200 mL premix     1,000 mg 200 mL/hr over 60 Minutes  Intravenous  Once 10/29/19 1243 10/29/19 1445      REVIEW OF SYSTEMS:  NA Objective:  VITALS:  BP (!) 99/52   Pulse 95   Temp (!) 102.8 F (39.3 C) (Rectal)   Resp 20   Ht _0  (1.549 m)   Wt 113.3 kg   SpO2 97%   BMI 47.20 kg/m  PHYSICAL EXAM:  General: Intubated, on pressors Head: Normocephalic, without obvious abnormality, atraumatic. Eyes: Conjunctivae clear, anicteric sclerae. Pupils are equal ENT cannot examine  Neck: Supple, symmetrical, no adenopathy, thyroid: non tender Right IJ line Back: No CVA tenderness. Lungs: Bilateral air entry.  Crypts bilateral Heart: Tachycardia  no murmur, rub or gallop. Abdomen: Soft, non-tender,not distended. Bowel sounds normal. No masses Extremities: atraumatic, no cyanosis. No edema. No clubbing Skin: No rashes or lesions. Or bruising, tattoos Lymph: Cervical, supraclavicular normal. Neurologic: Cannot be assessed Pertinent Labs Lab Results WBC   Platelet   CBC    Component Value Date/Time   WBC 49.0 (H) 11/03/2019 0545   RBC 4.15 11/03/2019 0545   HGB 10.6 (L) 11/03/2019 0545   HGB 12.4 06/06/2013 2001   HCT 32.9 (L) 11/03/2019 0545   HCT 36.3 06/06/2013 2001   PLT 610 (H) 11/03/2019 0545   PLT 356 06/06/2013 2001   MCV 79.3 (L) 11/03/2019 0545   MCV 78 (L) 06/06/2013 2001   MCH 25.5 (L) 11/03/2019 0545   MCHC 32.2 11/03/2019 0545   RDW 16.7 (H) 11/03/2019 0545   RDW 14.6 (H) 06/06/2013 2001   LYMPHSABS 8.7 (H) 11/03/2019 0545   LYMPHSABS 2.5 06/06/2013 2001   MONOABS 3.2 (H) 11/03/2019 0545   MONOABS 1.4 (H) 06/06/2013 2001   EOSABS 0.3 11/03/2019 0545   EOSABS 0.1 06/06/2013 2001   BASOSABS 0.1 11/03/2019 0545   BASOSABS 0.1 06/06/2013 2001    CMP Latest Ref Rng & Units 11/03/2019 11/02/2019 11/01/2019  Glucose 70 - 99 mg/dL 94 86 105(H)  BUN 6 - 20 mg/dL 26(H) 22(H) 19  Creatinine 0.44 - 1.00 mg/dL 0.82 0.44 0.57  Sodium 135 - 145 mmol/L 143 141 139  Potassium 3.5 - 5.1 mmol/L 4.0 4.2 4.8  Chloride  98 - 111 mmol/L 104 100 101  CO2 22 - 32 mmol/L _1 Calcium 8.9 - 10.3 mg/dL 8.5(L) 9.1 9.1  Total Protein 6.5 - 8.1 g/dL 7.2 7.9 7.5  Total Bilirubin 0.3 - 1.2 mg/dL 1.6(H) 1.1 0.9  Alkaline Phos 38 - 126 U/L 68 69 65  AST 15 - 41 U/L 15 10(L) 14(L)  ALT 0 - 44 U/L _2 Microbiology: Recent Results (from the past 240 hour(s))  Blood culture (routine x 2)     Status: None   Collection Time: 10/29/19 12:39 PM   Specimen: BLOOD  Result Value Ref Range Status   Specimen Description BLOOD LEFT ANTECUBITAL  Final   Special Requests   Final    BOTTLES DRAWN AEROBIC AND ANAEROBIC Blood Culture adequate volume   Culture   Final    NO GROWTH 5 DAYS Performed at [facility], [address], [place], [place] [address]    Report Status 11/03/2019 FINAL  Final  SARS Coronavirus 2 by RT PCR (  hospital order, performed in [facility] hospital lab) Nasopharyngeal Nasopharyngeal Swab     Status: None   Collection Time: 10/29/19 12:39 PM   Specimen: Nasopharyngeal Swab  Result Value Ref Range Status   SARS Coronavirus 2 NEGATIVE NEGATIVE Final    Comment: (NOTE) SARS-CoV-2 target nucleic acids are NOT DETECTED. The SARS-CoV-2 RNA is generally detectable in upper and lower respiratory specimens during the acute phase of infection. The lowest concentration of SARS-CoV-2 viral copies this assay can detect is 250 copies / mL. A negative result does not preclude SARS-CoV-2 infection and should not be used as the sole basis for treatment or other patient management decisions.  A negative result may occur with improper specimen collection / handling, submission of specimen other than nasopharyngeal swab, presence of viral mutation(s) within the areas targeted by this assay, and inadequate number of viral copies (<250 copies / mL). A negative result must be combined with clinical observations, patient history, and epidemiological information. Fact Sheet for Patients:    StrictlyIdeas.no Fact Sheet for Healthcare Providers: BankingDealers.co.za This test is not yet approved or cleared  by the [place] FDA and has been authorized for detection and/or diagnosis of SARS-CoV-2 by FDA under an Emergency Use Authorization (EUA).  This EUA will remain in effect (meaning this test can be used) for the duration of the COVID-19 declaration under Section 564(b)(1) of the Act, 21 U.S.C. section 360bbb-3(b)(1), unless the authorization is terminated or revoked sooner. Performed at [facility], [address] [place], [place] [address]   MRSA PCR Screening     Status: None   Collection Time: 10/30/19  8:27 AM   Specimen: Nasal Mucosa; Nasopharyngeal  Result Value Ref Range Status   MRSA by PCR NEGATIVE NEGATIVE Final    Comment:        The GeneXpert MRSA Assay (FDA approved for NASAL specimens only), is one component of a comprehensive MRSA colonization surveillance program. It is not intended to diagnose MRSA infection nor to guide or monitor treatment for MRSA infections. Performed at [facility], [address] [place], [place] [address]   Blood culture (routine x 2)     Status: None (Preliminary result)   Collection Time: 10/30/19  9:05 PM   Specimen: BLOOD LEFT FOREARM  Result Value Ref Range Status   Specimen Description BLOOD LEFT FOREARM  Final   Special Requests   Final    BOTTLES DRAWN AEROBIC AND ANAEROBIC Blood Culture results may not be optimal due to an excessive volume of blood received in culture bottles   Culture   Final    NO GROWTH 4 DAYS Performed at [facility], [address], [place], [place] [address]    Report Status PENDING  Incomplete  Culture, bal-quantitative     Status: None (Preliminary result)   Collection Time: 11/02/19  6:04 PM   Specimen: Bronchoalveolar Lavage; Respiratory  Result Value Ref Range Status   Specimen Description  BRONCHIAL ALVEOLAR LAVAGE  Final   Special Requests NONE  Final   Gram Stain   Final    RARE WBC PRESENT, PREDOMINANTLY PMN NO ORGANISMS SEEN Performed at [facility], [place] [address], [place], [place] [address]    Culture PENDING  Incomplete   Report Status PENDING  Incomplete  KOH prep     Status: None   Collection Time: 11/02/19  6:04 PM   Specimen: Bronchoalveolar Lavage  Result Value Ref Range Status   Specimen Description BRONCHIAL ALVEOLAR LAVAGE  Final   Special Requests Normal  Final   KOH Prep   Final    BUDDING YEAST SEEN Performed at [facility], [address] [place], [place] [address]    Report Status 11/03/2019 FINAL  Final  Culture, respiratory (non-expectorated)     Status: None (Preliminary result)   Collection Time: 11/02/19  6:04 PM   Specimen: Bronchoalveolar Lavage; Respiratory  Result Value Ref Range Status   Specimen Description   Final    BRONCHIAL ALVEOLAR LAVAGE Performed at [facility], [address] [place], [place] [address]    Special Requests   Final    Normal Performed at [facility], [address] [place], [place] [address]    Gram Stain   Final    FEW WBC PRESENT, PREDOMINANTLY MONONUCLEAR NO ORGANISMS SEEN Performed at [facility], [place] [address], [place], [place] [address]    Culture PENDING  Incomplete   Report Status PENDING  Incomplete  Respiratory Panel by PCR     Status: None   Collection Time: 11/02/19  6:04 PM   Specimen: Nasopharyngeal Swab; Respiratory  Result Value Ref Range Status   Adenovirus NOT DETECTED NOT DETECTED Final   Coronavirus 229E NOT DETECTED NOT DETECTED Final    Comment: (NOTE) The Coronavirus on the Respiratory Panel, DOES NOT test for the novel  Coronavirus (2019 nCoV)    Coronavirus HKU1 NOT DETECTED NOT DETECTED Final   Coronavirus NL63 NOT DETECTED NOT DETECTED Final   Coronavirus OC43 NOT DETECTED NOT DETECTED Final   Metapneumovirus NOT  DETECTED NOT DETECTED Final   Rhinovirus / Enterovirus NOT DETECTED NOT DETECTED Final   Influenza A NOT DETECTED NOT DETECTED Final   Influenza B NOT DETECTED NOT DETECTED Final   Parainfluenza Virus 1 NOT DETECTED NOT DETECTED Final   Parainfluenza Virus 2 NOT DETECTED NOT DETECTED Final   Parainfluenza Virus 3 NOT DETECTED NOT DETECTED Final   Parainfluenza Virus 4 NOT DETECTED NOT DETECTED Final   Respiratory Syncytial Virus NOT DETECTED NOT DETECTED Final   Bordetella pertussis NOT DETECTED NOT DETECTED Final   Chlamydophila pneumoniae NOT DETECTED NOT DETECTED Final   Mycoplasma pneumoniae NOT DETECTED NOT DETECTED Final    Comment: Performed at [facility], [place], [place], [place] [address]    IMAGING RESULTS:  scattered ground-glass opacities bilaterally Spleen is enlarged measuring up to 15 cm. There is geographic increased attenuation involving the cranial and caudal aspects of the spleen   I have personally reviewed the films ? Impression/Recommendation ?24 year old female with recurrent acute hypoxic respiratory failure bilateral infiltrates/ARDS needing intubation in 2019, 2020 and recently in April 2021. She is again intubated and has bilateral pulmonary infiltrates.  She also has severe leukocytosis suggestive of leukemoid reaction and also has thrombocytosis.  This is similar pattern in the past as well.  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 Does she have an overarching diagnosis for her recurrent infiltrate and ARDS-like picture?  Her her picture does not fit in with a bacterial infection. Could she have sarcoid, autoimmune illness like vasculitis, or sickle cell trait  related pulmonary syndrome and splenic infarct. She is currently on IV cefepime, IV vancomycin and metronidazole. She had bronchoscopy 2 days ago and cultures are cooking. Beta D glucan, pneumocystis PCR, and other work-up is pending. We will send  fungal antibiotics, toxo and crypto titers to complete the work-up. She is going to need lung biopsy. 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  This is not an active infection.  We will get a DNA.  Leukemoid reaction could be from the splenic infarct and steroids plus inflammatory status  Splenic infarct unclear etiology?  Due to sickle cell trait  ?  Blood disorder query rule out embolic manifestation from the heart  Anemia    Discussed with requesting provider. ___________________________________________________ Discussed with family  in detail Note:  This document was prepared using Dragon voice recognition software and may include unintentional dictation errors.

## 2019-11-03 NOTE — Consult Note (Addendum)
Consult History and Physical   SERVICE: Gynecology   Patient Name: Yesenia Carrillo Patient MRN:   [id number]   CC: Orignially admitted for respiratory distress.  Consult requested d/t recent SAB on 05/08/20 and a report by family of a foul smelling vaginal discharge.   HPI: Yes[facility] Carrillo is a 24 y.o. G1P0 presented with respiratory distress, which she has a long history of without definitive etiology, and now has a foul smelling vaginal discharge. She reported to MICU staff (she is intubated on my exam) that a recent pap smear at [facility] was abnormal, and that they had referred her to [facility] for further followup. Gynecology is consulted for review of gyn systems and evaluation for possible source of infection.  Hx of BV 04/2019, neg gonorrhea/chlamydia x2 years in our system. Neg HIV. +chlamydia 06/2013, neg 02/2017 and this year.  She came to the ER 05/09/19 with vaginal bleeding and found to have a low beta hcg, but no u/s done at that visit because she was also dx with respiratory distress and admitted for same. Prior hospitalized for acute hypoxic respiratory failure 2/2 covid 11/29-12/3/20.   No prior pregnancy hx before 05/2019, with the highest hcg of 31.  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  Past Obstetrical History: OB History   No obstetric history on file.   Seen in the ED on 05/08/20 for Hypoxia but also noted vaginal bleeding - SAB at 4-5 weeks  Serial Quants done (12/3) 31, (12/4) 10, (12/5) 6  Past Gynecologic History: LMP recorded as 04/06/19 Unknown if she has had a period since miscarriage  Past Medical History: Past Medical History:  Diagnosis Date  . COVID-19 04/2019   patient reports diagnosis in Nov 2020  . Hypertension   . Morbid obesity with BMI of 40.0-44.9, adult [facility])    Past Surgical History: Past Surgical History:  Procedure Laterality Date  . TONSILLECTOMY    . WISDOM TOOTH  EXTRACTION      Family History:  family history is not on file.   Social History:  Social History   Socioeconomic History  . Marital status: Single    Spouse name: Not on file  . Number of children: Not on file  . Years of education: Not on file  . Highest education level: Not on file  Occupational History  . Not on file  Tobacco Use  . Smoking status: Current Every Day Smoker    Types: Cigars  . Smokeless tobacco: Never Used  Substance and Sexual Activity  . Alcohol use: Yes    Comment: monthly  . Drug use: No  . Sexual activity: Yes    Birth control/protection: None  Other Topics Concern  . Not on file  Social History Narrative  . Not on file   Social Determinants of Health   Financial Resource Strain:   . Difficulty of Paying Living Expenses:   Food Insecurity:   . Worried About Charity fundraiser in the Last Year:   . Arboriculturist in the Last Year:   Transportation Needs:   . Film/video editor (Medical):   Marland Kitchen Lack of Transportation (Non-Medical):   Physical Activity:   . Days of Exercise per Week:   . Minutes of Exercise per Session:   Stress:   . Feeling of Stress :   Social Connections:   . Frequency of Communication with Friends and Family:   . Frequency of Social  Gatherings with Friends and Family:   . Attends Religious Services:   . Active Member of Clubs or Organizations:   . Attends Archivist Meetings:   Marland Kitchen Marital Status:   Intimate Partner Violence:   . Fear of Current or Ex-Partner:   . Emotionally Abused:   Marland Kitchen Physically Abused:   . Sexually Abused:     Home Medications:  Medications reconciled in EPIC   Allergies:  Allergies  Allergen Reactions  . Ampicillin Rash and Other (See Comments)    Patient and family cannot remember the specifics of the reaction.  Marland Kitchen Penicillins Hives    Physical Exam:  Temp:  [98.9 F (37.2 C)-103.6 F (39.8 C)] 102.9 F (39.4 C) (05/31 1500) Pulse Rate:  [89-139] 91 (05/31  1500) Resp:  [17-23] 20 (05/31 1500) BP: (70-147)/(29-94) 109/62 (05/31 1500) SpO2:  [93 %-100 %] 97 % (05/31 1500) FiO2 (%):  [65 %-100 %] 65 % (05/31 1200) Weight:  [113.3 kg] 113.3 kg (05/31 0500)     Labs/Studies:   CBC and Coags:    Component Value Date/Time   WBC 49.0 (H) 11/03/2019 0545   RBC 4.15 11/03/2019 0545   HGB 10.6 (L) 11/03/2019 0545   HGB 12.4 06/06/2013 2001   HCT 32.9 (L) 11/03/2019 0545   HCT 36.3 06/06/2013 2001   PLT 610 (H) 11/03/2019 0545   PLT 356 06/06/2013 2001   MCV 79.3 (L) 11/03/2019 0545   MCV 78 (L) 06/06/2013 2001   MCH 25.5 (L) 11/03/2019 0545   MCHC 32.2 11/03/2019 0545   RDW 16.7 (H) 11/03/2019 0545   RDW 14.6 (H) 06/06/2013 2001   LYMPHSABS 8.7 (H) 11/03/2019 0545   LYMPHSABS 2.5 06/06/2013 2001   MONOABS 3.2 (H) 11/03/2019 0545   MONOABS 1.4 (H) 06/06/2013 2001   EOSABS 0.3 11/03/2019 0545   EOSABS 0.1 06/06/2013 2001   BASOSABS 0.1 11/03/2019 0545   BASOSABS 0.1 06/06/2013 2001    Ref Range & Units 4 d ago 5 d ago 6 mo ago  Prothrombin Time 11.4 - 15.2 seconds 13.4  12.9  13.5   INR 0.8 - 1.2 1.1  1.0 CM  1.0 CM    Ref Range & Units 4 d ago 5 d ago 6 mo ago   aPTT 24 - 36 seconds 34  37High  CM  41High  CM    Ref Range & Units 5 d ago  (10/29/19) 6 mo ago  (05/04/19) 6 mo ago  (04/14/19)   Preg Test, Ur NEGATIVE NEGATIVE  POSITIVEAbnormal  CM  NEGATIVE CM    Component 5 d ago  ABO/RH(D) O POS   Antibody Screen NEG    Units 5 d ago  (10/29/19) 5 mo ago  (05/10/19) 5 mo ago  (05/09/19)   hCG, Beta Chain, Quant, S <5 mIU/mL 1  6High  CM  10High  CM     Ref Range & Units 6 mo ago 2 yr ago  Yeast Wet Prep HPF POC NONE SEEN NONE SEEN  NONE SEEN   Trich, Wet Prep NONE SEEN NONE SEEN  NONE SEEN   Clue Cells Wet Prep HPF POC NONE SEEN PRESENTAbnormal   NONE SEEN   WBC, Wet Prep HPF POC NONE SEEN MODERATEAbnormal   RAREAbnormal    Sperm  NONE SEEN  NONE SEEN     Ref Range & Units 6 mo ago  Chlamydia trachomatis, NAA Negative  Negative   Neisseria Gonorrhoeae by PCR Negative Negative  Ref Range & Units 2 yr ago  Specimen source GC/Chlam  ENDOCERVICAL   Chlamydia Tr NOT DETECTED NOT DETECTED   N gonorrhoeae NOT DETECTED NOT DETECTED         Abdominal ultrasound:   11/03/2019 IMPRESSION: Normal appearance of uterus and both ovaries. No evidence of pelvic mass or fluid collections.    Other Imaging: 2015 CLINICAL DATA:  Vaginal pain, tachycardia, fever.  EXAM:  TRANSABDOMINAL ULTRASOUND OF PELVIS  TECHNIQUE:  Transabdominal ultrasound examination of the pelvis was performed including evaluation of the uterus, ovaries, adnexal regions, and pelvic cul-de-sac.  COMPARISON:  None.  FINDINGS:  Uterus  Measurements: 8 x 3 x 4 cm. No fibroids or other mass visualized.  Endometrium Thickness: 6 mm.  No focal abnormality visualized.  Right ovary Measurements: 3.1 x 1.9 x 1.9 cm. Normal appearance/no adnexal mass.  Left ovary Measurements: 3.2 x 1.5 x 1.7 cm. Normal appearance/no adnexal mass.  Other findings:  No free fluid  IMPRESSION:  Negative pelvic ultrasound.  Electronically Signed    By: Jorje Guild M.D.    On: 06/06/2013 23:33   Physical exam: Gen: Pt intubated and sedated, calm Pelvic: External female genitalia: Tanner Stage 5, foley in place draining clear yellow urine Urethera: obscured  Vaginal: dark red discharge with normal mucus, no purulent discharge Cervix: small, nulliparous cervix with no tenderness (pt did respond to speculum placement, but no response to cervical manipulation), no lesions or polyps, no evidence of cervical cancer or abnormality Adnexal: limited by body habitus and sedation Rectal: Normal external   Assessment / Plan:   VALIA THURNER is a 24 y.o. G1P0010 presented with respiratory distress and sepsis, now has a foul smelling vaginal discharge per patient, without a good primary source of infection.  1. With a normal ultrasound, no findings on pelvic  CT, no acute findings on pelvic exam, and with negative beta hcg, I do not find a gynecologic source of concern - f/u on pelvic cultures collected today: GC/CT, yeast, bacterial vaginosis, trichomonas, aerobic and anaerobic vaginal cultures ordered - will follow-up during business hours with outpatient gyn clinic at [facility] to see if pap smear results were concerning and next step follow up need to be expedited.  2. Vaginal bleeding: I don't know when her last period was expected, but ddx includes regular menses, irregular vaginal bleeding from anticoagulation, illness, sepsis and hormonal dysregulation. Unless becomes high flow and causes anemia, not usually concerning, and we would not recommend hormonal manipulation while actively coagulopathic. Non-hormonal contraception is an option for her in the future if pregnancy not desired or recommended.   Thank you for the opportunity to be involved with this patient's care.  Please call if other gyn concerns, and will update if abnormal pap is concerning.

## 2019-11-03 NOTE — Consult Note (Signed)
ANTICOAGULATION CONSULT NOTE  Pharmacy Consult for Heparin Indication: Splenic Vein thrombosis  Patient Measurements: Height: '5\' 1"'  (154.9 cm) Weight: 113.3 kg (249 lb 12.5 oz) IBW/kg (Calculated) : 47.8 Heparin Dosing Weight: 75.8 kg   Vital Signs: Temp: 103.1 F (39.5 C) (05/31 0600) Temp Source: Rectal (05/31 0600) BP: 111/64 (05/31 0600) Pulse Rate: 90 (05/31 0600)  Labs: Recent Labs    11/01/19 0622 11/01/19 1306 11/02/19 0459 11/02/19 0459 11/02/19 1220 11/02/19 2130 11/03/19 0545  HGB 10.8*  --  11.3*  --   --   --   --   HCT 33.3*  --  35.4*  --   --   --   --   PLT 507*  --  572*  --   --   --   --   HEPARINUNFRC 0.43   < > 0.18*   < > 0.34 0.18* 0.46  CREATININE 0.57  --  0.44  --   --   --   --    < > = values in this interval not displayed.    Estimated Creatinine Clearance: 127.8 mL/min (by C-G formula based on SCr of 0.44 mg/dL).   Medical History: Past Medical History:  Diagnosis Date  . COVID-19 04/2019   patient reports diagnosis in Nov 2020  . Hypertension   . Morbid obesity with BMI of 40.0-44.9, adult (HCC)     Medications:  No medications prior to admission.   Scheduled:  . budesonide (PULMICORT) nebulizer solution  0.25 mg Nebulization BID  . chlorhexidine gluconate (MEDLINE KIT)  15 mL Mouth Rinse BID  . Chlorhexidine Gluconate Cloth  6 each Topical Daily  . cloNIDine  0.1 mg Transdermal Weekly  . diphenhydrAMINE  50 mg Intravenous BID  . fludrocortisone  0.1 mg Oral Daily  . furosemide  40 mg Intravenous BID  . ipratropium-albuterol  3 mL Nebulization Q4H  . mouth rinse  15 mL Mouth Rinse 10 times per day  . pneumococcal 23 valent vaccine  0.5 mL Intramuscular Tomorrow-1000  . rocuronium  40 mg Intravenous Once  . sodium chloride flush  3 mL Intravenous Q12H   Infusions:  . sodium chloride Stopped (11/03/19 0542)  . sodium chloride Stopped (11/02/19 2213)  . ceFEPime (MAXIPIME) IV 200 mL/hr at 11/03/19 0600  .  dexmedetomidine (PRECEDEX) IV infusion 1.2 mcg/kg/hr (11/03/19 0600)  . famotidine (PEPCID) IV Stopped (11/02/19 2329)  . fentaNYL infusion INTRAVENOUS 400 mcg/hr (11/03/19 0600)  . heparin 2,500 Units/hr (11/03/19 0600)  . metronidazole Stopped (11/03/19 0539)  . phenylephrine (NEO-SYNEPHRINE) Adult infusion 60 mcg/min (11/03/19 0600)  . vancomycin Stopped (11/02/19 2344)   PRN: sodium chloride, acetaminophen, docusate sodium, guaiFENesin-codeine, HYDROcodone-acetaminophen, HYDROmorphone (DILAUDID) injection, midazolam, ondansetron (ZOFRAN) IV, polyethylene glycol, sodium chloride flush Anti-infectives (From admission, onward)   Start     Dose/Rate Route Frequency Ordered Stop   10/31/19 0500  metroNIDAZOLE (FLAGYL) IVPB 500 mg     500 mg 100 mL/hr over 60 Minutes Intravenous Every 8 hours 10/31/19 0421     10/30/19 2200  vancomycin (VANCOCIN) IVPB 1000 mg/200 mL premix     1,000 mg 200 mL/hr over 60 Minutes Intravenous Every 12 hours 10/30/19 1456     10/30/19 1000  vancomycin (VANCOREADY) IVPB 1500 mg/300 mL  Status:  Discontinued     1,500 mg 150 mL/hr over 120 Minutes Intravenous Every 12 hours 10/29/19 2034 10/30/19 1456   10/29/19 2200  ceFEPIme (MAXIPIME) 2 g in sodium chloride 0.9 % 100 mL IVPB  2 g 200 mL/hr over 30 Minutes Intravenous Every 8 hours 10/29/19 2000     10/29/19 2000  vancomycin (VANCOCIN) IVPB 1000 mg/200 mL premix     1,000 mg 200 mL/hr over 60 Minutes Intravenous  Once 10/29/19 1958 10/29/19 2136   10/29/19 1245  ceFEPIme (MAXIPIME) 2 g in sodium chloride 0.9 % 100 mL IVPB     2 g 200 mL/hr over 30 Minutes Intravenous  Once 10/29/19 1243 10/29/19 1332   10/29/19 1245  metroNIDAZOLE (FLAGYL) IVPB 500 mg     500 mg 100 mL/hr over 60 Minutes Intravenous  Once 10/29/19 1243 10/29/19 1406   10/29/19 1245  vancomycin (VANCOCIN) IVPB 1000 mg/200 mL premix     1,000 mg 200 mL/hr over 60 Minutes Intravenous  Once 10/29/19 1243 10/29/19 1445       Assessment: 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 No DOAC noted PTA. H&H trending down, platelets are elevated at baseline and stable  Heparin Course: 0527 initiation: 4500 unit bolus, then 1200 units/hr 0528 0305 HL 0.18: 2000 unit bolus, then inc to 1400 units/hr 0528 1126 HL 0.16: 2000 unit bolus, then inc to 1700 units/hr 0528 2141 HL 0.21: SUBtherapeutic.  2000 unit bolus then inc to 2000 units/hr 5/29 '@0622'  HL=0.43. therapeutic: continue heparin infusion at 2000 units/hr. 5/30 '@0459'  HL=0.18. SUBtherapeutic. Will increase Heparin drip to 2300 units/hr.   Goal of Therapy:  Heparin level 0.3-0.7 units/ml Monitor platelets by anticoagulation protocol: Yes   Plan:   5/31 '@0545'  HL=0.46. therapeutic x 1. Will continue Heparin drip at 2500 units/hr.  Check anti-Xa level in 6 hours to confirm  Continue to monitor H&H and platelets  Ena Dawley, PharmD 11/03/2019,6:29 AM

## 2019-11-03 NOTE — Progress Notes (Signed)
Tylenol 1000mg  IV given for rectal temperature of 103.

## 2019-11-03 NOTE — Progress Notes (Addendum)
Pharmacy Antibiotic Note  Yesenia Carrillo is a 24 y.o. female admitted on 10/29/2019 with intra-abdominal infection.  Pharmacy has been consulted for fluconazole dosing.  Plan: Fluconazole 800 mg IV X 1 followed by fluconazole 400 mg IV Q24H to start on 6/1 @ 2300.  Height: 5\' 1"  (154.9 cm) Weight: 113.3 kg (249 lb 12.5 oz) IBW/kg (Calculated) : 47.8  Temp (24hrs), Avg:103.3 F (39.6 C), Min:102.6 F (39.2 C), Max:104.4 F (40.2 C)  Recent Labs  Lab 10/29/19 1158 10/29/19 1442 10/29/19 1621 10/30/19 0437 10/31/19 0305 10/31/19 0306 11/01/19 0622 11/02/19 0459 11/03/19 0545  WBC 33.9*  --    < > 34.5* 52.0*  51.7*  --  53.5* 54.4* 49.0*  CREATININE 0.56  --    < > 0.54  --  0.63 0.57 0.44 0.82  LATICACIDVEN 2.4* 1.4  --   --   --   --   --   --   --    < > = values in this interval not displayed.    Estimated Creatinine Clearance: 124.6 mL/min (by C-G formula based on SCr of 0.82 mg/dL).    Allergies  Allergen Reactions  . Ampicillin Rash and Other (See Comments)    Patient and family cannot remember the specifics of the reaction.  Marland Kitchen Penicillins Hives    Antimicrobials this admission:   >>    >>   Dose adjustments this admission:   Microbiology results:  BCx:   UCx:    Sputum:    MRSA PCR:   Thank you for allowing pharmacy to be a part of this patient's care.  Lissie Hinesley D 11/03/2019 10:03 PM

## 2019-11-04 ENCOUNTER — Inpatient Hospital Stay: Payer: Medicaid Other

## 2019-11-04 ENCOUNTER — Inpatient Hospital Stay (HOSPITAL_COMMUNITY)
Admit: 2019-11-04 | Discharge: 2019-11-04 | Disposition: A | Discharge status: Home / Self Care | Payer: Medicaid Other | Attending: Infectious Diseases | Admitting: Infectious Diseases

## 2019-11-04 DIAGNOSIS — J9601 Acute respiratory failure with hypoxia: Secondary | ICD-10-CM

## 2019-11-04 LAB — COMPREHENSIVE METABOLIC PANEL
ALT: 17 U/L (ref 0–44)
AST: 39 U/L (ref 15–41)
Albumin: 2.6 g/dL — ABNORMAL LOW (ref 3.5–5.0)
Alkaline Phosphatase: 66 U/L (ref 38–126)
Anion gap: 7 (ref 5–15)
BUN: 18 mg/dL (ref 6–20)
CO2: 27 mmol/L (ref 22–32)
Calcium: 8.2 mg/dL — ABNORMAL LOW (ref 8.9–10.3)
Chloride: 111 mmol/L (ref 98–111)
Creatinine, Ser: 0.77 mg/dL (ref 0.44–1.00)
GFR calc Af Amer: >60 mL/min (ref >60)
GFR calc non Af Amer: >60 mL/min (ref >60)
Glucose, Bld: 112 mg/dL — ABNORMAL HIGH (ref 70–99)
Potassium: 4.3 mmol/L (ref 3.5–5.1)
Sodium: 145 mmol/L (ref 135–145)
Total Bilirubin: 0.9 mg/dL (ref 0.3–1.2)
Total Protein: 7 g/dL (ref 6.5–8.1)

## 2019-11-04 LAB — CBC WITH DIFFERENTIAL/PLATELET
Abs Immature Granulocytes: 0.86 10*3/uL — ABNORMAL HIGH (ref 0.00–0.07)
Basophils Absolute: 0.1 10*3/uL (ref 0.0–0.1)
Basophils Relative: 0 %
Eosinophils Absolute: 0 10*3/uL (ref 0.0–0.5)
Eosinophils Relative: 0 %
HCT: 30.5 % — ABNORMAL LOW (ref 36.0–46.0)
Hemoglobin: 9.8 g/dL — ABNORMAL LOW (ref 12.0–15.0)
Immature Granulocytes: 2 %
Lymphocytes Relative: 6 %
Lymphs Abs: 2.9 10*3/uL (ref 0.7–4.0)
MCH: 25.8 pg — ABNORMAL LOW (ref 26.0–34.0)
MCHC: 32.1 g/dL (ref 30.0–36.0)
MCV: 80.3 fL (ref 80.0–100.0)
Monocytes Absolute: 2.5 10*3/uL — ABNORMAL HIGH (ref 0.1–1.0)
Monocytes Relative: 5 %
Neutro Abs: 40.8 10*3/uL — ABNORMAL HIGH (ref 1.7–7.7)
Neutrophils Relative %: 87 %
Platelets: 560 10*3/uL — ABNORMAL HIGH (ref 150–400)
RBC: 3.8 MIL/uL — ABNORMAL LOW (ref 3.87–5.11)
RDW: 17.2 % — ABNORMAL HIGH (ref 11.5–15.5)
Smear Review: NORMAL
WBC: 47.2 10*3/uL — ABNORMAL HIGH (ref 4.0–10.5)
nRBC: 0 % (ref 0.0–0.2)

## 2019-11-04 LAB — GLUCOSE, CAPILLARY
Glucose-Capillary: 115 mg/dL — ABNORMAL HIGH (ref 70–99)
Glucose-Capillary: 81 mg/dL (ref 70–99)
Glucose-Capillary: 83 mg/dL (ref 70–99)
Glucose-Capillary: 88 mg/dL (ref 70–99)
Glucose-Capillary: 88 mg/dL (ref 70–99)
Glucose-Capillary: 95 mg/dL (ref 70–99)

## 2019-11-04 LAB — VANCOMYCIN, TROUGH: Vancomycin Tr: 11 ug/mL — ABNORMAL LOW (ref 15–20)

## 2019-11-04 LAB — CULTURE, BLOOD (ROUTINE X 2): Culture: NO GROWTH

## 2019-11-04 LAB — MAGNESIUM: Magnesium: 2.1 mg/dL (ref 1.7–2.4)

## 2019-11-04 LAB — TRIGLYCERIDES: Triglycerides: 261 mg/dL — ABNORMAL HIGH (ref <150)

## 2019-11-04 LAB — RPR: RPR Ser Ql: NONREACTIVE

## 2019-11-04 LAB — PHOSPHORUS: Phosphorus: 3.3 mg/dL (ref 2.5–4.6)

## 2019-11-04 MED ORDER — HEPARIN (PORCINE) 25000 UT/250ML-% IV SOLN
2500.0000 [IU]/h | INTRAVENOUS | Status: DC
  Administered 2019-11-04: 2500 [IU]/h via INTRAVENOUS
  Filled 2019-11-04: qty 250

## 2019-11-04 MED ORDER — PROPOFOL 1000 MG/100ML IV EMUL
INTRAVENOUS | Status: AC
  Filled 2019-11-04: qty 100

## 2019-11-04 MED ORDER — ROCURONIUM BROMIDE 50 MG/5ML IV SOLN: 40.0000 mg | Freq: Once | INTRAVENOUS | Status: DC

## 2019-11-04 MED ORDER — ROCURONIUM BROMIDE 50 MG/5ML IV SOLN
INTRAVENOUS | Status: AC
  Filled 2019-11-04: qty 1

## 2019-11-04 MED ORDER — VANCOMYCIN HCL 1500 MG/300ML IV SOLN
1500.0000 mg | Freq: Two times a day (BID) | INTRAVENOUS | Status: DC
  Administered 2019-11-04 (×2): 1500 mg via INTRAVENOUS
  Filled 2019-11-04 (×3): qty 300

## 2019-11-04 MED ORDER — LIDOCAINE HCL URETHRAL/MUCOSAL 2 % EX GEL
1.0000 "application " | Freq: Once | CUTANEOUS | Status: DC
  Filled 2019-11-04: qty 5

## 2019-11-04 MED ORDER — VITAL HIGH PROTEIN PO LIQD: 1000.0000 mL | ORAL | Status: DC

## 2019-11-04 MED ORDER — PHENYLEPHRINE HCL 0.25 % NA SOLN
1.0000 | Freq: Four times a day (QID) | NASAL | Status: DC | PRN
  Filled 2019-11-04: qty 15

## 2019-11-04 MED ORDER — PROPOFOL 1000 MG/100ML IV EMUL: 5.0000 ug/kg/min | INTRAVENOUS | Status: DC

## 2019-11-04 MED ORDER — BUTAMBEN-TETRACAINE-BENZOCAINE 2-2-14 % EX AERO
1.0000 | INHALATION_SPRAY | Freq: Once | CUTANEOUS | Status: DC
  Filled 2019-11-04: qty 20

## 2019-11-04 MED ORDER — LIDOCAINE HCL (PF) 1 % IJ SOLN
30.0000 mL | Freq: Once | INTRAMUSCULAR | Status: DC
  Filled 2019-11-04: qty 30

## 2019-11-04 NOTE — Consult Note (Signed)
Pharmacy Antibiotic Note  [facility]illo is a 24 y.o. female admitted on 10/29/2019 with pneumonia.  Pharmacy has been consulted for Vancomycin and Cefepime dosing. She is noted to have had multiple recent admissions for ARDS. This is day  # 6 of broad-spectrum IV antibiotics.  ID consulted.   Plan: 05/31 @ 2123 VT 11 mcg/mL w/: AUC 333.5 mcg*h/mL Cmax: 17.3 Cmin: 10.9 mcg/mL  Will adjust dose to vanc 1.5g IV q12h to start 06/01 @ 1000 w/: AUC 500.2 mcg*h/mL Cmax: 25.7 mcg/mL Cmin: 16.6 mcg/mL  Vd of = 113.3 kg x 0.5L/kg = 56.65L used to estimate peak = 1000 mg/56.65L = 17.65 mcg/mL that was used for estimation.  Will check vanc trough 06/02 @ 2100 for a goal trough of 15 - 20 mcg/mL per tradition dosing given full AUC dosing is not accessible given lack of vanc peak reagent.  2) continue cefepime 2g IV Q8 hours  Will continue to monitor renal fx and clinical status and adjust doses as appropriate.  Height: 5\' 1"  (154.9 cm) Weight: 113.3 kg (249 lb 12.5 oz) IBW/kg (Calculated) : 47.8  Temp (24hrs), Avg:103.5 F (39.7 C), Min:102.8 F (39.3 C), Max:104.5 F (40.3 C)  Recent Labs  Lab 10/29/19 1158 10/29/19 1442 10/29/19 1621 10/30/19 0437 10/31/19 0305 10/31/19 0306 11/01/19 0622 11/02/19 0459 11/03/19 0545 11/03/19 2123  WBC 33.9*  --    < > 34.5* 52.0*  51.7*  --  53.5* 54.4* 49.0*  --   CREATININE 0.56  --    < > 0.54  --  0.63 0.57 0.44 0.82  --   LATICACIDVEN 2.4* 1.4  --   --   --   --   --   --   --   --   VANCOTROUGH  --   --   --   --   --   --   --   --   --  11*   < > = values in this interval not displayed.    Estimated Creatinine Clearance: 124.6 mL/min (by C-G formula based on SCr of 0.82 mg/dL).    Allergies  Allergen Reactions  . Ampicillin Rash and Other (See Comments)    Patient and family cannot remember the specifics of the reaction.  Marland Kitchen Penicillins Hives    Antimicrobials this admission: Vancomycin 5/26 >>  Cefepime 5/26 >>   Metronidazole 5/26 >>  Microbiology results: 5/26 BCx: pending 5/27 MRSA PCR: negative 5/26 SARS CoV-2: negative  Thank you for allowing pharmacy to be a part of this patient's care.  Tobie Lords, PharmD, BCPS Clinical Pharmacist 11/04/2019 2:30 AM

## 2019-11-04 NOTE — Progress Notes (Signed)
EBUS bronchoscopy performed at patients bedside in ICU. Prior to procedure time out was performed with everyone in attendance in agreement of pt, DOB, procedure, and allergies. Time out performed at 12.16. EBUS scope # [id number] used for procedure with Time scope in @ 12:34 and scope out at 1308. Patient tolerated procedure well. Participants were Dr. Lanney Gins, Tiffany RN,  And This RT. [employer] Present as well.

## 2019-11-04 NOTE — Procedures (Signed)
ENDOBRONCHIAL ULTRASOUND PROCEDURE NOTE    Flexible bronchoscopy was performed on 11/04/19 by : Lanney Gins MD  assistance by : 1)Shannon Mabe RT  and 2)Tiffany RN 3)[facility] staff 4) Annia Belt RT   Indication for the procedure was :  Pre-procedural H&P. The following assessment was performed on the day of the procedure prior to initiating sedation History:  Chest pain n Dyspnea y Hemoptysis n Cough y Fever y Other pertinent items y - severe acute hypoxemic respiratory failure  Examination Vital signs -reviewed as per nursing documentation today Cardiac    Murmurs: n  Rubs : n  Gallop: n Lungs Wheezing: ynRales : y Rhonchi {:y  Other pertinent findings: y   Pre-procedural assessment for Procedural Sedation included: Depth of sedation: As per anesthesia team  ASA Classification:  2 Mallampati airway assessment: 4    Medication list reviewed: y  The patient's interval history was taken and revealed: no new complaints The pre- procedure physical examination revealed: No new findings Refer to prior clinic note for details.  Informed Consent: Informed consent was obtained from:  patient after explanation of procedure and risks, benefits, as well as alternative procedures available.  Explanation of level of sedation and possible transfusion was also provided.    Procedural Preparation: Time out was performed and patient was identified by name and birthdate and procedure to be performed and side for sampling, if any, was specified. Pt was intubated by anesthesia.  The patient was appropriately draped.  Procedure Findings: Bronchoscope was inserted via ETT  without difficulty.  Posterior oropharynx, epiglottis, arytenoids, false cords and vocal cords were not visualized as these were bypassed by endotracheal tube.   The distal trachea was normal in circumference and appearance without mucosal, cartilaginous or branching abnormalities.  The main carina was  splayed .   All right and left lobar airways were visualized to the Sub-segmental level.  Sub- sub segmental carinae were identified in all the distal airways.   Secretions were visible in the following airways and appeared to be mucopuruelnt phlegm.  The mucosa was : mildly friable  Airways were notable for:        exophytic lesions :none        extrinsic compression in the following distributions: none .       Friable mucosa: mild bilaterally throughout       Anthrocotic material /pigmentation: none   Pictorial documentation attached: none     -------------------------------------------------------------------- The fiberoptic bronchoscope was removed and the EBUS scope was introduced. Examination began to evaluate for pathologically enlarged lymph nodes.  There was no tumors or masses found on CT chest. All lymph node biopsies performed with 21G needle. Lymph node biopsies were sent in cytolite for all stations. Additional sampling via RPMI for flow cytometry.     STATION NO OF PASSES SAMPLE ADEQUACY DIAGNOSIS   STATION 10R -3 passes-  Adequate sample -good lymphoid shedding - size 1.8  STATION 7 -3 passes -Adequate sample -good lymphoid shedding -size 3.1cm STATION 10L- 3 passes - Adequate sample -good lymphoid shedding - size 1.6   Cytology evaluation in process.   Specimens obtained included:  Immediate sampling complications included:none  Epinephrine 58m was used topically  The bronchoscopy was terminated due to completion of the planned procedure and the bronchoscope was removed.   Total dosage of Lidocaine was 053mTotal fluoroscopy time was 0 minutes  Supplemental oxygen was provided at MV Fio2-100%  Estimated Blood loss: 1cc.  Complications included:  none immediate  Disposition: Transfer to [place] MD  [place] Division of Pulmonary & Critical Care Medicine

## 2019-11-04 NOTE — Consult Note (Signed)
ANTICOAGULATION CONSULT NOTE  Pharmacy Consult for Heparin Indication: Splenic Vein thrombosis  Patient Measurements: Height: 5' 0.98" (154.9 cm) Weight: 108.8 kg (239 lb 13.8 oz) IBW/kg (Calculated) : 47.76 Heparin Dosing Weight: 75.8 kg   Vital Signs: Temp: 103.4 F (39.7 C) (06/01 0600) Temp Source: Rectal (06/01 0600) BP: 153/98 (06/01 0645) Pulse Rate: 82 (06/01 0645)  Labs: Recent Labs    11/02/19 0459 11/02/19 0459 11/02/19 1220 11/02/19 2130 11/03/19 0545 11/04/19 0505  HGB 11.3*   < >  --   --  10.6* 9.8*  HCT 35.4*  --   --   --  32.9* 30.5*  PLT 572*  --   --   --  610* 560*  HEPARINUNFRC 0.18*   < > 0.34 0.18* 0.46  --   CREATININE 0.44  --   --   --  0.82 0.77   < > = values in this interval not displayed.    Estimated Creatinine Clearance: 124.7 mL/min (by C-G formula based on SCr of 0.77 mg/dL).   Medical History: Past Medical History:  Diagnosis Date  . COVID-19 04/2019   patient reports diagnosis in Nov 2020  . Hypertension   . Morbid obesity with BMI of 40.0-44.9, adult (HCC)     Medications:  No medications prior to admission.   Scheduled:  . budesonide (PULMICORT) nebulizer solution  0.25 mg Nebulization BID  . chlorhexidine gluconate (MEDLINE KIT)  15 mL Mouth Rinse BID  . Chlorhexidine Gluconate Cloth  6 each Topical Daily  . fludrocortisone  0.1 mg Oral Daily  . hydrocortisone sod succinate (SOLU-CORTEF) inj  50 mg Intravenous Q6H  . ipratropium-albuterol  3 mL Nebulization Q4H  . mouth rinse  15 mL Mouth Rinse 10 times per day  . pneumococcal 23 valent vaccine  0.5 mL Intramuscular Tomorrow-1000  . sodium chloride flush  3 mL Intravenous Q12H   Infusions:  . sodium chloride 5 mL/hr at 11/04/19 0600  . sodium chloride Stopped (11/02/19 2213)  . acetaminophen Stopped (11/04/19 0417)  . ceFEPime (MAXIPIME) IV Stopped (11/04/19 0545)  . dexmedetomidine (PRECEDEX) IV infusion 1.2 mcg/kg/hr (11/04/19 0645)  . dextrose 10 mL/hr at  11/04/19 0600  . famotidine (PEPCID) IV Stopped (11/03/19 2255)  . fentaNYL infusion INTRAVENOUS 400 mcg/hr (11/04/19 0611)  . fluconazole (DIFLUCAN) IV    . metronidazole Stopped (11/04/19 0523)  . phenylephrine (NEO-SYNEPHRINE) Adult infusion 40 mcg/min (11/04/19 0600)  . vancomycin     PRN: sodium chloride, docusate sodium, midazolam, ondansetron (ZOFRAN) IV, polyethylene glycol, sodium chloride flush Anti-infectives (From admission, onward)   Start     Dose/Rate Route Frequency Ordered Stop   11/04/19 2300  fluconazole (DIFLUCAN) IVPB 400 mg     400 mg 100 mL/hr over 120 Minutes Intravenous Every 24 hours 11/03/19 2253     11/04/19 1000  vancomycin (VANCOREADY) IVPB 1500 mg/300 mL     1,500 mg 150 mL/hr over 120 Minutes Intravenous Every 12 hours 11/04/19 0230     11/03/19 2300  fluconazole (DIFLUCAN) IVPB 800 mg     800 mg 200 mL/hr over 120 Minutes Intravenous  Once 11/03/19 2253 11/04/19 0330   11/03/19 2215  fluconazole (DIFLUCAN) tablet 150 mg  Status:  Discontinued     150 mg Oral  Once 11/03/19 2203 11/03/19 2255   11/03/19 1915  azithromycin (ZITHROMAX) 500 mg in sodium chloride 0.9 % 250 mL IVPB     500 mg 250 mL/hr over 60 Minutes Intravenous  Once 11/03/19 1909 11/03/19   2056   11/03/19 1915  azithromycin (ZITHROMAX) 500 mg in sodium chloride 0.9 % 250 mL IVPB     500 mg 250 mL/hr over 60 Minutes Intravenous  Once 11/03/19 1909 11/03/19 2222   11/03/19 1845  azithromycin (ZITHROMAX) 1,000 mg in dextrose 5 % 250 mL IVPB  Status:  Discontinued     1,000 mg 250 mL/hr over 60 Minutes Intravenous  Once 11/03/19 1837 11/03/19 1908   10/31/19 0500  metroNIDAZOLE (FLAGYL) IVPB 500 mg     500 mg 100 mL/hr over 60 Minutes Intravenous Every 8 hours 10/31/19 0421     10/30/19 2200  vancomycin (VANCOCIN) IVPB 1000 mg/200 mL premix  Status:  Discontinued     1,000 mg 200 mL/hr over 60 Minutes Intravenous Every 12 hours 10/30/19 1456 11/04/19 0230   10/30/19 1000  vancomycin  (VANCOREADY) IVPB 1500 mg/300 mL  Status:  Discontinued     1,500 mg 150 mL/hr over 120 Minutes Intravenous Every 12 hours 10/29/19 2034 10/30/19 1456   10/29/19 2200  ceFEPIme (MAXIPIME) 2 g in sodium chloride 0.9 % 100 mL IVPB     2 g 200 mL/hr over 30 Minutes Intravenous Every 8 hours 10/29/19 2000     10/29/19 2000  vancomycin (VANCOCIN) IVPB 1000 mg/200 mL premix     1,000 mg 200 mL/hr over 60 Minutes Intravenous  Once 10/29/19 1958 10/29/19 2136   10/29/19 1245  ceFEPIme (MAXIPIME) 2 g in sodium chloride 0.9 % 100 mL IVPB     2 g 200 mL/hr over 30 Minutes Intravenous  Once 10/29/19 1243 10/29/19 1332   10/29/19 1245  metroNIDAZOLE (FLAGYL) IVPB 500 mg     500 mg 100 mL/hr over 60 Minutes Intravenous  Once 10/29/19 1243 10/29/19 1406   10/29/19 1245  vancomycin (VANCOCIN) IVPB 1000 mg/200 mL premix     1,000 mg 200 mL/hr over 60 Minutes Intravenous  Once 10/29/19 1243 10/29/19 1445      Assessment: 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 No DOAC PTA. H&H trending down, platelets are elevated at baseline and stable. H&H trending down, platelets remain elevated and trending up  Heparin Course: 0527 initiation: 4500 unit bolus, then 1200 units/hr 0528 0305 HL 0.18: 2000 unit bolus, then inc to 1400 units/hr 0528 1126 HL 0.16: 2000 unit bolus, then inc to 1700 units/hr 0528 2141 HL 0.21: 2000 unit bolus then inc to 2000 units/hr 0529 0622 HL 0.43. no change  0530 0459 HL 0.18. increase to 2300 units/hr 0530 2130 HL 0.18:  0531 0545 HL 0.46 increase to 2500 units/hr 0531 1005 held for biopsy of lymphadenopathy 6/1   Goal of Therapy:  Heparin level 0.3-0.7 units/ml Monitor platelets by anticoagulation protocol: Yes   Plan:   Biopsy was completed at approximately 1400: no complications reported (standard IR bleeding risk)  Restart heparin drip at 2500 units/hr beginning at 2000  Check anti-Xa level in 6 hours to confirm  Continue to monitor H&H  and platelets  Rodney D Grubb, PharmD 11/04/2019,7:21 AM  

## 2019-11-04 NOTE — Progress Notes (Signed)
[facility] here to transfer pt to [facility] MICU.  Patient sedated with fentanyl and precedex drips.  Heparin drip and neo drip remain infusing.  D10 drip and Vancomycin paused for transfer. 2mg  versed given for pt comfort.  Patient's mother and grandmother at bedside.

## 2019-11-04 NOTE — Progress Notes (Signed)
CRITICAL CARE NOTE  24 yo morbidly obese AAF with previous h/o COVID 55, last month 09/2019 patient had rhinovirus LRTI.  She reports spontaneous pneumonia recurrently. She had admission to [facility] 04/2018 with septic shock , severe metabolic acidosis and multifocal pneumonia placed on vanc/cefepime and eventually required VV-ECMO cannulation while on mechanical ventilation.  She had left femoral vein trauma during this and had to have surgical repair of femoral vein.She was seen by ID and coverage was broadened to vanc/zithromax/fluconazole/meropenem.  Patient then had Left segmental PE likely provoked from ECMO and completed 3 months of eliquis.  Patient comes via EMS. EMS reportslower abdominal pain that radiates to her back. Pt states this started yesterday. Pt states tenderness to belly.Patient denies any N/V. Pt states dizziness and feels like she is going to pass out. Patient appears in distress and diaphoretic on presentation to MICU.   11/02/19- patient is continues to be severely hypoxemic on 60% HFNC elevated setting with unsucsessful trial of weaning O2 few days in a row already. I discussed case with mother today she does not know what is the diagnosis patient has despite repeated admissions to hospital and repeated abnormal CT chest. They have asked for workup to be done on [facility] however we have not been able to secure transfer due to bed shortages there.  [facility] has declined to accept her.  I have explained to patient that she is very tachypneic and tachycardic and high risk for death. Family has asked for elective endotracheal intubation.  11/03/19- patient remains critically ill, BAL specimens are pending, s/p ID and OBGYN evaluation  11/04/19- patient remains febrile, she had transvaginal US which was normal. EBUS today for biopsy of bulky mediastinal lymphadenopathy. Plan for transfer to [facility] today, spoke with provider there today.   CC  follow up respiratory failure  SUBJECTIVE Patient remains  critically ill Prognosis is guarded Alert and awake Severe hypoxia High risk for intubation   BP 114/68   Pulse (!) 101   Temp (!) 103.3 F (39.6 C) (Rectal)   Resp 18   Ht 5' 0.98" (1.549 m)   Wt 108.8 kg   SpO2 100%   BMI 45.35 kg/m    I/O last 3 completed shifts: In: 6669.1 [I.V.:3377.1; NG/GT:191; IV Piggyback:3101] Out: 2950 [Urine:2950] Total I/O In: -  Out: 850 [Urine:850]  SpO2: 100 % O2 Flow Rate (L/min): 0 L/min FiO2 (%): 80 %  Estimated body mass index is 45.35 kg/m as calculated from the following:   Height as of this encounter: 5' 0.98" (1.549 m).   Weight as of this encounter: 108.8 kg.   Review of Systems:  Unable to obtain while sedated on MV      PHYSICAL EXAMINATION:  GENERAL:obese age appropriate , sedated on Vent HEAD: Normocephalic, atraumatic.  EYES: Pupils equal, round, reactive to light.  No scleral icterus.  MOUTH: Moist mucosal membrane. NECK: Supple.  PULMONARY: mild rhonchi bilaterally  CARDIOVASCULAR: S1 and S2. Regular rate and rhythm. No murmurs, rubs, or gallops.  GASTROINTESTINAL: +tender, +distended.  Positive bowel sounds.   MUSCULOSKELETAL: No swelling, clubbing, or edema.  NEUROLOGIC:GCS3T on MV with sedation  SKIN:intact,warm,dry  MEDICATIONS: I have reviewed all medications and confirmed regimen as documented   CULTURE RESULTS   Recent Results (from the past 240 hour(s))  Blood culture (routine x 2)     Status: None   Collection Time: 10/29/19 12:39 PM   Specimen: BLOOD  Result Value Ref Range Status   Specimen Description BLOOD LEFT ANTECUBITAL  Final   Special Requests   Final    BOTTLES DRAWN AEROBIC AND ANAEROBIC Blood Culture adequate volume   Culture   Final    NO GROWTH 5 DAYS Performed at [facility], [address] [place], [place] [address]    Report Status 11/03/2019 FINAL  Final  SARS Coronavirus 2 by RT PCR (hospital order, performed in [facility] hospital lab) Nasopharyngeal  Nasopharyngeal Swab     Status: None   Collection Time: 10/29/19 12:39 PM   Specimen: Nasopharyngeal Swab  Result Value Ref Range Status   SARS Coronavirus 2 NEGATIVE NEGATIVE Final    Comment: (NOTE) SARS-CoV-2 target nucleic acids are NOT DETECTED. The SARS-CoV-2 RNA is generally detectable in upper and lower respiratory specimens during the acute phase of infection. The lowest concentration of SARS-CoV-2 viral copies this assay can detect is 250 copies / mL. A negative result does not preclude SARS-CoV-2 infection and should not be used as the sole basis for treatment or other patient management decisions.  A negative result may occur with improper specimen collection / handling, submission of specimen other than nasopharyngeal swab, presence of viral mutation(s) within the areas targeted by this assay, and inadequate number of viral copies (<250 copies / mL). A negative result must be combined with clinical observations, patient history, and epidemiological information. Fact Sheet for Patients:   StrictlyIdeas.no Fact Sheet for Healthcare Providers: BankingDealers.co.za This test is not yet approved or cleared  by the [place] FDA and has been authorized for detection and/or diagnosis of SARS-CoV-2 by FDA under an Emergency Use Authorization (EUA).  This EUA will remain in effect (meaning this test can be used) for the duration of the COVID-19 declaration under Section 564(b)(1) of the Act, 21 U.S.C. section 360bbb-3(b)(1), unless the authorization is terminated or revoked sooner. Performed at [facility], [address] [place], [place] [address]   MRSA PCR Screening     Status: None   Collection Time: 10/30/19  8:27 AM   Specimen: Nasal Mucosa; Nasopharyngeal  Result Value Ref Range Status   MRSA by PCR NEGATIVE NEGATIVE Final    Comment:        The GeneXpert MRSA Assay (FDA approved for NASAL  specimens only), is one component of a comprehensive MRSA colonization surveillance program. It is not intended to diagnose MRSA infection nor to guide or monitor treatment for MRSA infections. Performed at [facility], [address] [place], [place] [address]   Blood culture (routine x 2)     Status: None   Collection Time: 10/30/19  9:05 PM   Specimen: BLOOD LEFT FOREARM  Result Value Ref Range Status   Specimen Description BLOOD LEFT FOREARM  Final   Special Requests   Final    BOTTLES DRAWN AEROBIC AND ANAEROBIC Blood Culture results may not be optimal due to an excessive volume of blood received in culture bottles   Culture   Final    NO GROWTH 5 DAYS Performed at [facility], [address] [place], [place] [address]    Report Status 11/04/2019 FINAL  Final  Culture, bal-quantitative     Status: Abnormal (Preliminary result)   Collection Time: 11/02/19  6:04 PM   Specimen: Bronchoalveolar Lavage; Respiratory  Result Value Ref Range Status   Specimen Description BRONCHIAL ALVEOLAR LAVAGE  Final   Special Requests NONE  Final   Gram Stain   Final    RARE WBC PRESENT, PREDOMINANTLY PMN NO ORGANISMS SEEN  Culture (A)  Final    40,000 COLONIES/mL STAPHYLOCOCCUS LUGDUNENSIS SUSCEPTIBILITIES TO FOLLOW Performed at [facility], [place] [address], [place], [place] [address]    Report Status PENDING  Incomplete  KOH prep     Status: None   Collection Time: 11/02/19  6:04 PM   Specimen: Bronchoalveolar Lavage  Result Value Ref Range Status   Specimen Description BRONCHIAL ALVEOLAR LAVAGE  Final   Special Requests Normal  Final   KOH Prep   Final    BUDDING YEAST SEEN Performed at [facility], [address] [place], [place] [address]    Report Status 11/03/2019 FINAL  Final  Culture, respiratory (non-expectorated)     Status: None (Preliminary result)   Collection Time: 11/02/19  6:04 PM   Specimen: Bronchoalveolar Lavage;  Respiratory  Result Value Ref Range Status   Specimen Description   Final    BRONCHIAL ALVEOLAR LAVAGE Performed at [facility], [address] [place], [place] [address]    Special Requests   Final    Normal Performed at [facility], [address] [place], [place] [address]    Gram Stain   Final    FEW WBC PRESENT, PREDOMINANTLY MONONUCLEAR NO ORGANISMS SEEN    Culture   Final    CULTURE REINCUBATED FOR BETTER GROWTH Performed at [facility], [place] [address] [place], [place] [address]    Report Status PENDING  Incomplete  Respiratory Panel by PCR     Status: None   Collection Time: 11/02/19  6:04 PM   Specimen: Nasopharyngeal Swab; Respiratory  Result Value Ref Range Status   Adenovirus NOT DETECTED NOT DETECTED Final   Coronavirus 229E NOT DETECTED NOT DETECTED Final    Comment: (NOTE) The Coronavirus on the Respiratory Panel, DOES NOT test for the novel  Coronavirus (2019 nCoV)    Coronavirus HKU1 NOT DETECTED NOT DETECTED Final   Coronavirus NL63 NOT DETECTED NOT DETECTED Final   Coronavirus OC43 NOT DETECTED NOT DETECTED Final   Metapneumovirus NOT DETECTED NOT DETECTED Final   Rhinovirus / Enterovirus NOT DETECTED NOT DETECTED Final   Influenza A NOT DETECTED NOT DETECTED Final   Influenza B NOT DETECTED NOT DETECTED Final   Parainfluenza Virus 1 NOT DETECTED NOT DETECTED Final   Parainfluenza Virus 2 NOT DETECTED NOT DETECTED Final   Parainfluenza Virus 3 NOT DETECTED NOT DETECTED Final   Parainfluenza Virus 4 NOT DETECTED NOT DETECTED Final   Respiratory Syncytial Virus NOT DETECTED NOT DETECTED Final   Bordetella pertussis NOT DETECTED NOT DETECTED Final   Chlamydophila pneumoniae NOT DETECTED NOT DETECTED Final   Mycoplasma pneumoniae NOT DETECTED NOT DETECTED Final    Comment: Performed at [facility], [place], [place], [place] [address]  Wet prep, genital     Status: Abnormal   Collection Time: 11/03/19  4:29 PM    Specimen: Genital  Result Value Ref Range Status   Yeast Wet Prep HPF POC NONE SEEN NONE SEEN Final   Trich, Wet Prep NONE SEEN NONE SEEN Final   Clue Cells Wet Prep HPF POC NONE SEEN NONE SEEN Final   WBC, Wet Prep HPF POC MODERATE (A) NONE SEEN Final   Sperm NONE SEEN  Final    Comment: Performed at [facility], [address], [place], [place] [address]  [place] rt PCR [facility] only)     Status: Abnormal   Collection Time: 11/03/19  4:29 PM  Result Value Ref  Range Status   Specimen source GC/Chlam URINE, RANDOM  Final   Chlamydia Tr DETECTED (A) NOT DETECTED Final   N gonorrhoeae NOT DETECTED NOT DETECTED Final    Comment: (NOTE) This CT/NG assay has not been evaluated in patients with a history of  hysterectomy. Performed at [facility], [address] [place], [place] [address]   Aerobic Culture (superficial specimen)     Status: None (Preliminary result)   Collection Time: 11/03/19  4:30 PM   Specimen: Vaginal  Result Value Ref Range Status   Specimen Description   Final    VAGINA Performed at [facility], [address], [place], [place] [address]    Special Requests   Final    NONE Performed at [facility], [address] [place], [place] [address]    Gram Stain   Final    MODERATE WBC PRESENT, PREDOMINANTLY PMN NO ORGANISMS SEEN    Culture   Final    NO GROWTH < 24 HOURS Performed at [facility], [place] [address] [place], [place] [address]    Report Status PENDING  Incomplete          IMAGING    DG Chest Port 1 View  Result Date: 11/04/2019 CLINICAL DATA:  Acute respiratory failure EXAM: PORTABLE CHEST 1 VIEW COMPARISON:  Yesterday FINDINGS: Endotracheal tube tip is halfway between the clavicular heads and carina. The enteric tube reaches the stomach and the right IJ line is at the upper cavoatrial junction. Generous cardiopericardial size in the setting of low lung volumes. Low volume chest with  bilateral pulmonary infiltrate. No visible effusion or air leak. IMPRESSION: Stable hardware positioning and pulmonary infiltrates. Electronically Signed   By: Monte Fantasia M.D.   On: 11/04/2019 05:29   CT Angio Abd/Pel w/ and/or w/o  Result Date: 11/04/2019 CLINICAL DATA:  Abdominal pain and fevers EXAM: CTA ABDOMEN AND PELVIS WITHOUT AND WITH CONTRAST TECHNIQUE: Multidetector CT imaging of the abdomen and pelvis was performed using the standard protocol during bolus administration of intravenous contrast. Multiplanar reconstructed images and MIPs were obtained and reviewed to evaluate the vascular anatomy. CONTRAST:  177m OMNIPAQUE IOHEXOL 350 MG/ML SOLN COMPARISON:  10/29/2019 FINDINGS: VASCULAR Aorta: Abdominal aorta is widely patent without aneurysmal dilatation or evidence of dissection. Celiac: Patent without evidence of aneurysm, dissection, vasculitis or significant stenosis. SMA: Patent without evidence of aneurysm, dissection, vasculitis or significant stenosis. Renals: Both renal arteries are patent without evidence of aneurysm, dissection, vasculitis, fibromuscular dysplasia or significant stenosis. IMA: Patent without evidence of aneurysm, dissection, vasculitis or significant stenosis. Iliacs: Widely patent without focal stenosis. Veins: No specific venous abnormality is noted. Review of the MIP images confirms the above findings. NON-VASCULAR Lower chest: Lung bases again demonstrate diffuse parenchymal opacities with air bronchograms similar to that seen on prior examination. Hepatobiliary: Fatty infiltration of the liver is noted. Gallbladder is well distended with vicarious excretion of contrast material. Pancreas: Pancreas is unremarkable. Spleen: Spleen again demonstrates a mottled enhancement pattern which persists on portal venous phase images. The overall appearance is stable from the prior exam and may be related to prior infarct. Adrenals/Urinary Tract: Adrenal glands are within  normal limits. Kidneys demonstrate a normal enhancement pattern bilaterally. No renal calculi or obstructive changes are seen. The bladder is decompressed by Foley catheter. Stomach/Bowel: Rectal tube is coiled within the rectum. No obstructive or inflammatory changes are noted. The appendix is within normal limits. Small bowel and stomach are within normal limits. Lymphatic:  Scattered small lymph nodes are noted likely reactive in nature. Reproductive: Uterus and bilateral adnexa are unremarkable. Other: Mild inflammatory changes noted along the left pericolic gutter. Musculoskeletal: No acute bony abnormality is noted. IMPRESSION: VASCULAR No evidence of mesenteric ischemia is noted. NON-VASCULAR Stable enhancement pattern is noted within the spleen similar to that noted on the prior exam. Some mild inflammatory changes are seen. Again this likely represents an infarct. No splenic vein thrombus is noted on this exam. Diffuse parenchymal opacities in the lung bases similar to that seen on the prior exam. No other acute abnormality is noted. Electronically Signed   By: Inez Catalina M.D.   On: 11/04/2019 00:44     Nutrition Status: Nutrition Problem: Inadequate oral intake Etiology: inability to eat Signs/Symptoms: NPO status Interventions: Tube feeding    ASSESSMENT AND PLAN SYNOPSIS   Severe ACUTE Hypoxic and Hypercapnic Respiratory Failure from pneumonia and ARDS s/p COVID  -previous h/o ARDS s/p ECMO-04/2018 -reviewed serial CT chest- patient has had hilar and mediastinal lymphadenopathy for years as evidenced above -may have vasculitic/autoimmune component  -awaiting transfer to [facility] per request of family  -EBV-IgG titers >too high to read -patient had WBC count >77K in 2019 during similar episode-[facility] records -Urine cultures with >100,000 colonies Klebsiella - 09/2019- [facility] records -Immune workup including immunoglobulins, ANA, ANCA  All negative -09/2019- [facility]  records previous-Bronchoscopy with BAL - +yeast non crypto, negative PCP, negative histoplasma, negative KOH, negative AFB - 09/2019- [facility] records -all blood cultures have been negative at [facility] -septic workup negative thus far -ID and OBGYN evaluation -without source of infection.  Thus far pneumonia found only.   ACUTE DIASTOLIC CARDIAC FAILURE-  -oxygen as needed -Lasix as tolerated   Morbid obesity, possible OSA.   Will certainly impact respiratory mechanics,   CARDIAC ICU monitoring  ID -continue IV abx as prescibed -follow up cultures  GI GI PROPHYLAXIS as indicated  NUTRITIONAL STATUS Nutrition Status: Nutrition Problem: Inadequate oral intake Etiology: inability to eat Signs/Symptoms: NPO status Interventions: Tube feeding   DIET--> as tolerated Constipation protocol as indicated  ENDO - will use ICU hypoglycemic\Hyperglycemia protocol if indicated     ELECTROLYTES -follow labs as needed -replace as needed -pharmacy consultation and following   DVT/GI PRX ordered and assessed TRANSFUSIONS AS NEEDED MONITOR FSBS    CASE DISCUSSED IN [place] WITH ICU TEAM  Critical care provider statement:    Critical care time (minutes):  33   Critical care time was exclusive of:  Separately billable procedures and  treating other patients   Critical care was necessary to treat or prevent imminent or  life-threatening deterioration of the following conditions:  Acute hypoxemic respiratory failure, bulky hilar/mediastinal lymphadenopathy, OSA, morbid obesity   Critical care was time spent personally by me on the following  activities:  Development of treatment plan with patient or surrogate,  discussions with consultants, evaluation of patient's response to  treatment, examination of patient, obtaining history from patient or  surrogate, ordering and performing treatments and interventions, ordering  and review of laboratory studies and  re-evaluation of patient's condition   I assumed direction of critical care for this patient from another  provider in my specialty: no     Critical Care Time devoted to patient care services described in this note is 34 minutes.   Overall, patient is critically ill, prognosis is guarded.   high risk for cardiac arrest and death.   Transfer to [facility] pending   Ottie Glazier, M.D.  Pulmonary & [place]

## 2019-11-04 NOTE — Progress Notes (Signed)
Progress note for lab results: No yeast, BV, gonorrhea, found.+chlamydia yesterday, treated with 1g iv axithromycin per CDC guidelines. I would not expect this infection to be contributing to her on-going pulmonary needs, but she has now been treated.

## 2019-11-04 NOTE — Progress Notes (Signed)
Transported pt to CT and returned to ICU 13 on the vent without incident. Pt remains on the vent and is tol well at this time.

## 2019-11-05 LAB — COMP PANEL: LEUKEMIA/LYMPHOMA

## 2019-11-05 LAB — EPSTEIN BARR VRS(EBV DNA BY PCR)
EBV DNA QN by PCR: 184 copies/mL
log10 EBV DNA Qn PCR: 2.265 log10 copy/mL

## 2019-11-05 LAB — CULTURE, BAL-QUANTITATIVE W GRAM STAIN: Culture: 40000 — AB

## 2019-11-05 LAB — CULTURE, RESPIRATORY W GRAM STAIN
Culture: NORMAL
Special Requests: NORMAL

## 2019-11-05 LAB — CRYPTOCOCCAL ANTIGEN: Crypto Ag: NEGATIVE

## 2019-11-05 MED ORDER — GENERIC EXTERNAL MEDICATION
Status: DC
Start: ? — End: 2019-11-05

## 2019-11-05 MED ORDER — PROPOFOL 100 MG/10ML IV EMUL
5.00 | INTRAVENOUS | Status: DC
Start: ? — End: 2019-11-05

## 2019-11-05 MED ORDER — VANCOMYCIN HCL IN NACL 1.25-0.9 GM/250ML-% IV SOLN
1.25 | INTRAVENOUS | Status: DC
Start: 2019-11-05 — End: 2019-11-05

## 2019-11-05 MED ORDER — GENERIC EXTERNAL MEDICATION
500.00 | Status: DC
Start: 2019-11-05 — End: 2019-11-05

## 2019-11-05 MED ORDER — HEPARIN SOD (PORCINE) IN D5W 100 UNIT/ML IV SOLN
2100.00 | INTRAVENOUS | Status: DC
Start: ? — End: 2019-11-05

## 2019-11-05 MED ORDER — CHLORHEXIDINE GLUCONATE 0.12 % MT SOLN
5.00 | OROMUCOSAL | Status: DC
Start: 2019-11-06 — End: 2019-11-05

## 2019-11-05 MED ORDER — HYDROCORTISONE NA SUCCINATE PF 100 MG IJ SOLR
50.00 | INTRAMUSCULAR | Status: DC
Start: 2019-11-05 — End: 2019-11-05

## 2019-11-05 MED ORDER — ACETAMINOPHEN 160 MG/5ML PO SUSP
650.00 | ORAL | Status: DC
Start: ? — End: 2019-11-05

## 2019-11-05 MED ORDER — GENERIC EXTERNAL MEDICATION
20.00 | Status: DC
Start: 2019-11-06 — End: 2019-11-05

## 2019-11-05 MED ORDER — GENERIC EXTERNAL MEDICATION
1.00 | Status: DC
Start: 2019-11-05 — End: 2019-11-05

## 2019-11-05 MED ORDER — IPRATROPIUM-ALBUTEROL 0.5-2.5 (3) MG/3ML IN SOLN
3.00 | RESPIRATORY_TRACT | Status: DC
Start: ? — End: 2019-11-05

## 2019-11-05 MED ORDER — NOREPINEPHRINE-SODIUM CHLORIDE 16-0.9 MG/250ML-% IV SOLN
0.01 | INTRAVENOUS | Status: DC
Start: ? — End: 2019-11-05

## 2019-11-05 MED ORDER — FLUCONAZOLE IN DEXTROSE 400 MG/200ML IV SOLN
400.00 | INTRAVENOUS | Status: DC
Start: 2019-11-06 — End: 2019-11-05

## 2019-11-05 MED ORDER — SENNOSIDES-DOCUSATE SODIUM 8.6-50 MG PO TABS
2.00 | ORAL_TABLET | ORAL | Status: DC
Start: 2019-11-05 — End: 2019-11-05

## 2019-11-05 MED ORDER — POLYETHYLENE GLYCOL 3350 17 GM/SCOOP PO POWD
17.00 | ORAL | Status: DC
Start: 2019-11-07 — End: 2019-11-05

## 2019-11-06 LAB — CYTOLOGY - NON PAP

## 2019-11-06 LAB — TOXOPLASMA ANTIBODIES- IGG AND  IGM
Toxoplasma Antibody- IgM: <3 AU/mL (ref 0.0–7.9)
Toxoplasma IgG Ratio: <3 IU/mL (ref 0.0–7.1)

## 2019-11-06 LAB — AEROBIC CULTURE W GRAM STAIN (SUPERFICIAL SPECIMEN)

## 2019-11-06 LAB — FUNGITELL, SERUM: Fungitell Result: <31 pg/mL (ref <80)

## 2019-11-06 MED ORDER — TRAZODONE HCL 50 MG PO TABS
50.00 | ORAL_TABLET | ORAL | Status: DC
Start: ? — End: 2019-11-06

## 2019-11-06 MED ORDER — POTASSIUM CHLORIDE 40 MEQ/100ML IV SOLN
40.00 | INTRAVENOUS | Status: DC
Start: 2019-11-06 — End: 2019-11-06

## 2019-11-06 MED ORDER — MELATONIN 3 MG PO TABS
6.00 | ORAL_TABLET | ORAL | Status: DC
Start: 2019-11-06 — End: 2019-11-06

## 2019-11-06 MED ORDER — HYDROMORPHONE HCL 1 MG/ML IJ SOLN
0.50 | INTRAMUSCULAR | Status: DC
Start: ? — End: 2019-11-06

## 2019-11-06 MED ORDER — DEXMEDETOMIDINE HCL IN NACL 400 MCG/100ML IV SOLN
0.20 | INTRAVENOUS | Status: DC
Start: ? — End: 2019-11-06

## 2019-11-06 MED ORDER — MAGNESIUM SULFATE 2 GM/50ML IV SOLN
2.00 | INTRAVENOUS | Status: DC
Start: 2019-11-06 — End: 2019-11-06

## 2019-11-06 MED ORDER — FAMOTIDINE 20 MG PO TABS
20.00 | ORAL_TABLET | ORAL | Status: DC
Start: 2019-11-07 — End: 2019-11-06

## 2019-11-06 MED ORDER — GENERIC EXTERNAL MEDICATION
1.00 | Status: DC
Start: ? — End: 2019-11-06

## 2019-11-06 MED ORDER — GENERIC EXTERNAL MEDICATION
Status: DC
Start: ? — End: 2019-11-06

## 2019-11-06 MED ORDER — ONDANSETRON HCL 4 MG/2ML IJ SOLN
4.00 | INTRAMUSCULAR | Status: DC
Start: ? — End: 2019-11-06

## 2019-11-06 MED ORDER — GABAPENTIN 100 MG PO CAPS
100.00 | ORAL_CAPSULE | ORAL | Status: DC
Start: 2019-11-07 — End: 2019-11-06

## 2019-11-06 MED ORDER — OXYCODONE HCL 5 MG PO TABS
5.00 | ORAL_TABLET | ORAL | Status: DC
Start: ? — End: 2019-11-06

## 2019-11-07 LAB — EPSTEIN BARR VRS(EBV DNA BY PCR)
EBV DNA QN by PCR: 436 copies/mL
log10 EBV DNA Qn PCR: 2.639 log10 copy/mL

## 2019-11-07 LAB — ACID FAST SMEAR (AFB, MYCOBACTERIA): Acid Fast Smear: NEGATIVE

## 2019-11-07 NOTE — Discharge Summary (Signed)
CRITICAL CARE NOTE  24 yo morbidly obese AAF with previous h/o COVID 15, last month 09/2019 patient had rhinovirus LRTI.  She reports spontaneous pneumonia recurrently. She had admission to [facility] 04/2018 with septic shock , severe metabolic acidosis and multifocal pneumonia placed on vanc/cefepime and eventually required VV-ECMO cannulation while on mechanical ventilation.  She had left femoral vein trauma during this and had to have surgical repair of femoral vein.She was seen by ID and coverage was broadened to vanc/zithromax/fluconazole/meropenem.  Patient then had Left segmental PE likely provoked from ECMO and completed 3 months of eliquis.  Patient comes via EMS. EMS reportslower abdominal pain that radiates to her back. Pt states this started yesterday. Pt states tenderness to belly.Patient denies any N/V. Pt states dizziness and feels like she is going to pass out. Patient appears in distress and diaphoretic on presentation to MICU.   11/02/19- patient is continues to be severely hypoxemic on 60% HFNC elevated setting with unsucsessful trial of weaning O2 few days in a row already. I discussed case with mother today she does not know what is the diagnosis patient has despite repeated admissions to hospital and repeated abnormal CT chest. They have asked for workup to be done on [facility] however we have not been able to secure transfer due to bed shortages there.  [facility] has declined to accept her.  I have explained to patient that she is very tachypneic and tachycardic and high risk for death. Family has asked for elective endotracheal intubation.  11/03/19- patient remains critically ill, BAL specimens are pending, s/p ID and OBGYN evaluation  11/04/19- patient remains febrile, she had transvaginal US which was normal. EBUS today for biopsy of bulky mediastinal lymphadenopathy. Plan for transfer to [facility] today, spoke with provider there today.   CC  follow up respiratory failure  SUBJECTIVE Patient remains  critically ill Prognosis is guarded Alert and awake Severe hypoxia High risk for intubation   BP (!) 101/58   Pulse (!) 109   Temp (!) 102 F (38.9 C) (Rectal)   Resp 16   Ht 5' 0.98" (1.549 m)   Wt 108.8 kg   SpO2 95%   BMI 45.35 kg/m    I/O last 3 completed shifts: In: 3399.4 [I.V.:1408; NG/GT:191; IV Piggyback:1800.4] Out: 1950 [Urine:1950] No intake/output data recorded.  SpO2: 95 % O2 Flow Rate (L/min): 0 L/min FiO2 (%): 40 %  Estimated body mass index is 45.35 kg/m as calculated from the following:   Height as of this encounter: 5' 0.98" (1.549 m).   Weight as of this encounter: 108.8 kg.   Review of Systems:  Unable to obtain while sedated on MV      PHYSICAL EXAMINATION:  GENERAL:obese age appropriate , sedated on Vent HEAD: Normocephalic, atraumatic.  EYES: Pupils equal, round, reactive to light.  No scleral icterus.  MOUTH: Moist mucosal membrane. NECK: Supple.  PULMONARY: mild rhonchi bilaterally  CARDIOVASCULAR: S1 and S2. Regular rate and rhythm. No murmurs, rubs, or gallops.  GASTROINTESTINAL: +tender, +distended.  Positive bowel sounds.   MUSCULOSKELETAL: No swelling, clubbing, or edema.  NEUROLOGIC:GCS3T on MV with sedation  SKIN:intact,warm,dry  MEDICATIONS: I have reviewed all medications and confirmed regimen as documented   CULTURE RESULTS   Recent Results (from the past 240 hour(s))  Blood culture (routine x 2)     Status: None   Collection Time: 10/29/19 12:39 PM   Specimen: BLOOD  Result Value Ref Range Status   Specimen Description BLOOD LEFT ANTECUBITAL  Final  Special Requests   Final    BOTTLES DRAWN AEROBIC AND ANAEROBIC Blood Culture adequate volume   Culture   Final    NO GROWTH 5 DAYS Performed at [facility], [address] [place], [place] [address]    Report Status 11/03/2019 FINAL  Final  SARS Coronavirus 2 by RT PCR (hospital order, performed in [facility] hospital lab) Nasopharyngeal  Nasopharyngeal Swab     Status: None   Collection Time: 10/29/19 12:39 PM   Specimen: Nasopharyngeal Swab  Result Value Ref Range Status   SARS Coronavirus 2 NEGATIVE NEGATIVE Final    Comment: (NOTE) SARS-CoV-2 target nucleic acids are NOT DETECTED. The SARS-CoV-2 RNA is generally detectable in upper and lower respiratory specimens during the acute phase of infection. The lowest concentration of SARS-CoV-2 viral copies this assay can detect is 250 copies / mL. A negative result does not preclude SARS-CoV-2 infection and should not be used as the sole basis for treatment or other patient management decisions.  A negative result may occur with improper specimen collection / handling, submission of specimen other than nasopharyngeal swab, presence of viral mutation(s) within the areas targeted by this assay, and inadequate number of viral copies (<250 copies / mL). A negative result must be combined with clinical observations, patient history, and epidemiological information. Fact Sheet for Patients:   StrictlyIdeas.no Fact Sheet for Healthcare Providers: BankingDealers.co.za This test is not yet approved or cleared  by the [place] FDA and has been authorized for detection and/or diagnosis of SARS-CoV-2 by FDA under an Emergency Use Authorization (EUA).  This EUA will remain in effect (meaning this test can be used) for the duration of the COVID-19 declaration under Section 564(b)(1) of the Act, 21 U.S.C. section 360bbb-3(b)(1), unless the authorization is terminated or revoked sooner. Performed at [facility], [address] [place], [place] [address]   MRSA PCR Screening     Status: None   Collection Time: 10/30/19  8:27 AM   Specimen: Nasal Mucosa; Nasopharyngeal  Result Value Ref Range Status   MRSA by PCR NEGATIVE NEGATIVE Final    Comment:        The GeneXpert MRSA Assay (FDA approved for NASAL  specimens only), is one component of a comprehensive MRSA colonization surveillance program. It is not intended to diagnose MRSA infection nor to guide or monitor treatment for MRSA infections. Performed at [facility], [address] [place], [place] [address]   Blood culture (routine x 2)     Status: None   Collection Time: 10/30/19  9:05 PM   Specimen: BLOOD LEFT FOREARM  Result Value Ref Range Status   Specimen Description BLOOD LEFT FOREARM  Final   Special Requests   Final    BOTTLES DRAWN AEROBIC AND ANAEROBIC Blood Culture results may not be optimal due to an excessive volume of blood received in culture bottles   Culture   Final    NO GROWTH 5 DAYS Performed at [facility], [address], [place], [place] [address]    Report Status 11/04/2019 FINAL  Final  Culture, bal-quantitative     Status: Abnormal   Collection Time: 11/02/19  6:04 PM   Specimen: Bronchoalveolar Lavage; Respiratory  Result Value Ref Range Status   Specimen Description BRONCHIAL ALVEOLAR LAVAGE  Final   Special Requests NONE  Final   Gram Stain   Final    RARE WBC PRESENT, PREDOMINANTLY PMN NO ORGANISMS SEEN Performed at [facility], [address] [place], [place] [address]    Culture 40,000 COLONIES/mL STAPHYLOCOCCUS LUGDUNENSIS (A)  Final   Report Status 11/05/2019 FINAL  Final   Organism ID, Bacteria STAPHYLOCOCCUS LUGDUNENSIS (A)  Final      Susceptibility   Staphylococcus lugdunensis - MIC*    CIPROFLOXACIN <=0.5 SENSITIVE Sensitive     ERYTHROMYCIN >=8 RESISTANT Resistant     GENTAMICIN <=0.5 SENSITIVE Sensitive     OXACILLIN >=4 RESISTANT Resistant     TETRACYCLINE <=1 SENSITIVE Sensitive     VANCOMYCIN <=0.5 SENSITIVE Sensitive     TRIMETH/SULFA <=10 SENSITIVE Sensitive     CLINDAMYCIN RESISTANT Resistant     RIFAMPIN <=0.5 SENSITIVE Sensitive     Inducible Clindamycin POSITIVE Resistant     * 40,000 COLONIES/mL STAPHYLOCOCCUS LUGDUNENSIS  KOH prep      Status: None   Collection Time: 11/02/19  6:04 PM   Specimen: Bronchoalveolar Lavage  Result Value Ref Range Status   Specimen Description BRONCHIAL ALVEOLAR LAVAGE  Final   Special Requests Normal  Final   KOH Prep   Final    BUDDING YEAST SEEN Performed at [facility], [address], [place], [place] [address]    Report Status 11/03/2019 FINAL  Final  Culture, respiratory (non-expectorated)     Status: None   Collection Time: 11/02/19  6:04 PM   Specimen: Bronchoalveolar Lavage; Respiratory  Result Value Ref Range Status   Specimen Description   Final    BRONCHIAL ALVEOLAR LAVAGE Performed at [facility], [address] [place], [place] [address]    Special Requests   Final    Normal Performed at [facility], [address] [place], [place] [address]    Gram Stain   Final    FEW WBC PRESENT, PREDOMINANTLY MONONUCLEAR NO ORGANISMS SEEN    Culture   Final    Consistent with normal respiratory flora. Performed at [facility], [place] [address], [place], [place] [address]    Report Status 11/05/2019 FINAL  Final  Acid Fast Smear (AFB)     Status: None   Collection Time: 11/02/19  6:04 PM   Specimen: Bronchoalveolar Lavage; Respiratory  Result Value Ref Range Status   AFB Specimen Processing Concentration  Final   Acid Fast Smear Negative  Final    Comment: (NOTE) Performed At: [facility] [place], [place] [id number] Rush Farmer MD [id number]    Source (AFB) SPUTUM  Final    Comment: Performed at [facility], [address] [place], [place] [address]  Respiratory Panel by PCR     Status: None   Collection Time: 11/02/19  6:04 PM   Specimen: Nasopharyngeal Swab; Respiratory  Result Value Ref Range Status   Adenovirus NOT DETECTED NOT DETECTED Final   Coronavirus 229E NOT DETECTED NOT DETECTED Final    Comment: (NOTE) The Coronavirus on the Respiratory Panel, DOES NOT test for the novel   Coronavirus (2019 nCoV)    Coronavirus HKU1 NOT DETECTED NOT DETECTED Final   Coronavirus NL63 NOT DETECTED NOT DETECTED Final   Coronavirus OC43 NOT DETECTED NOT DETECTED Final   Metapneumovirus NOT DETECTED NOT DETECTED Final   Rhinovirus / Enterovirus NOT DETECTED NOT DETECTED Final   Influenza A NOT DETECTED NOT DETECTED Final   Influenza B NOT DETECTED NOT DETECTED Final   Parainfluenza Virus 1 NOT DETECTED NOT DETECTED Final   Parainfluenza Virus 2 NOT DETECTED NOT DETECTED Final   Parainfluenza Virus 3 NOT DETECTED  NOT DETECTED Final   Parainfluenza Virus 4 NOT DETECTED NOT DETECTED Final   Respiratory Syncytial Virus NOT DETECTED NOT DETECTED Final   Bordetella pertussis NOT DETECTED NOT DETECTED Final   Chlamydophila pneumoniae NOT DETECTED NOT DETECTED Final   Mycoplasma pneumoniae NOT DETECTED NOT DETECTED Final    Comment: Performed at [facility], [place] [address] [place], [place] [address]  Wet prep, genital     Status: Abnormal   Collection Time: 11/03/19  4:29 PM   Specimen: Genital  Result Value Ref Range Status   Yeast Wet Prep HPF POC NONE SEEN NONE SEEN Final   Trich, Wet Prep NONE SEEN NONE SEEN Final   Clue Cells Wet Prep HPF POC NONE SEEN NONE SEEN Final   WBC, Wet Prep HPF POC MODERATE (A) NONE SEEN Final   Sperm NONE SEEN  Final    Comment: Performed at [facility], [address] [place], [place] [address]  [place] rt PCR [facility] only)     Status: Abnormal   Collection Time: 11/03/19  4:29 PM  Result Value Ref Range Status   Specimen source GC/Chlam URINE, RANDOM  Final   Chlamydia Tr DETECTED (A) NOT DETECTED Final   N gonorrhoeae NOT DETECTED NOT DETECTED Final    Comment: (NOTE) This CT/NG assay has not been evaluated in patients with a history of  hysterectomy. Performed at [facility], [address], [place], [place] [address]   Aerobic Culture (superficial specimen)     Status: None   Collection Time:  11/03/19  4:30 PM   Specimen: Vaginal  Result Value Ref Range Status   Specimen Description   Final    VAGINA Performed at [facility], [address] [place], [place] [address]    Special Requests   Final    NONE Performed at [facility], [address] [place], [place] [address]    Gram Stain   Final    MODERATE WBC PRESENT, PREDOMINANTLY PMN NO ORGANISMS SEEN Performed at [facility], [place] [address] [place], [place] [address]    Culture RARE CANDIDA ALBICANS  Final   Report Status 11/06/2019 FINAL  Final          IMAGING    No results found.   Nutrition Status: Nutrition Problem: Inadequate oral intake Etiology: inability to eat Signs/Symptoms: NPO status Interventions: Tube feeding    ASSESSMENT AND PLAN SYNOPSIS   Severe ACUTE Hypoxic and Hypercapnic Respiratory Failure from pneumonia and ARDS s/p COVID  -previous h/o ARDS s/p ECMO-04/2018 -reviewed serial CT chest- patient has had hilar and mediastinal lymphadenopathy for years as evidenced above -may have vasculitic/autoimmune component  -awaiting transfer to [facility] per request of family  -EBV-IgG titers >too high to read -patient had WBC count >77K in 2019 during similar episode-[facility] records -Urine cultures with >100,000 colonies Klebsiella - 09/2019- [facility] records -Immune workup including immunoglobulins, ANA, ANCA  All negative -09/2019- [facility] records previous-Bronchoscopy with BAL - +yeast non crypto, negative PCP, negative histoplasma, negative KOH, negative AFB - 09/2019- [facility] records -all blood cultures have been negative at [facility] -septic workup negative thus far -ID and OBGYN evaluation -without source of infection.  Thus far pneumonia found only.   ACUTE DIASTOLIC CARDIAC FAILURE-  -oxygen as needed -Lasix as tolerated   Morbid obesity, possible OSA.   Will certainly impact respiratory mechanics,   CARDIAC ICU monitoring  ID -continue IV abx as  prescibed -follow up cultures  GI GI  PROPHYLAXIS as indicated  NUTRITIONAL STATUS Nutrition Status: Nutrition Problem: Inadequate oral intake Etiology: inability to eat Signs/Symptoms: NPO status Interventions: Tube feeding   DIET--> as tolerated Constipation protocol as indicated  ENDO - will use ICU hypoglycemic\Hyperglycemia protocol if indicated     ELECTROLYTES -follow labs as needed -replace as needed -pharmacy consultation and following   DVT/GI PRX ordered and assessed TRANSFUSIONS AS NEEDED MONITOR FSBS    CASE DISCUSSED IN [place] WITH ICU TEAM  Critical care provider statement:    Critical care time (minutes):  33   Critical care time was exclusive of:  Separately billable procedures and  treating other patients   Critical care was necessary to treat or prevent imminent or  life-threatening deterioration of the following conditions:  Acute hypoxemic respiratory failure, bulky hilar/mediastinal lymphadenopathy, OSA, morbid obesity   Critical care was time spent personally by me on the following  activities:  Development of treatment plan with patient or surrogate,  discussions with consultants, evaluation of patient's response to  treatment, examination of patient, obtaining history from patient or  surrogate, ordering and performing treatments and interventions, ordering  and review of laboratory studies and re-evaluation of patient's condition   I assumed direction of critical care for this patient from another  provider in my specialty: no     Critical Care Time devoted to patient care services described in this note is 34 minutes.   Overall, patient is critically ill, prognosis is guarded.   high risk for cardiac arrest and death.   Transfer to [facility] pending   Ottie Glazier, M.D.  Pulmonary & [place]

## 2019-11-08 LAB — MISC LABCORP TEST (SEND OUT)
LabCorp test name: 164284
Labcorp test code: 164284

## 2019-11-11 LAB — PNEUMOCYSTIS PCR: Result Pneumocystis PCR: NEGATIVE

## 2019-11-12 ENCOUNTER — Other Ambulatory Visit: Payer: Self-pay

## 2019-11-12 ENCOUNTER — Encounter: Payer: Self-pay | Admitting: Emergency Medicine

## 2019-11-12 ENCOUNTER — Emergency Department: Payer: Medicaid Other

## 2019-11-12 ENCOUNTER — Emergency Department
Admission: EM | Admit: 2019-11-12 | Discharge: 2019-11-12 | Disposition: A | Discharge status: Other Acute Inpatient Hospital | Payer: Medicaid Other | Attending: Emergency Medicine | Admitting: Emergency Medicine

## 2019-11-12 DIAGNOSIS — R0902 Hypoxemia: Secondary | ICD-10-CM | POA: Diagnosis not present

## 2019-11-12 DIAGNOSIS — Z8616 Personal history of COVID-19: Secondary | ICD-10-CM | POA: Diagnosis not present

## 2019-11-12 DIAGNOSIS — I10 Essential (primary) hypertension: Secondary | ICD-10-CM | POA: Insufficient documentation

## 2019-11-12 DIAGNOSIS — R0989 Other specified symptoms and signs involving the circulatory and respiratory systems: Secondary | ICD-10-CM | POA: Diagnosis not present

## 2019-11-12 DIAGNOSIS — D473 Essential (hemorrhagic) thrombocythemia: Secondary | ICD-10-CM | POA: Insufficient documentation

## 2019-11-12 DIAGNOSIS — R0600 Dyspnea, unspecified: Secondary | ICD-10-CM | POA: Insufficient documentation

## 2019-11-12 DIAGNOSIS — D72829 Elevated white blood cell count, unspecified: Secondary | ICD-10-CM | POA: Insufficient documentation

## 2019-11-12 DIAGNOSIS — R0602 Shortness of breath: Secondary | ICD-10-CM | POA: Diagnosis present

## 2019-11-12 LAB — TROPONIN I (HIGH SENSITIVITY): Troponin I (High Sensitivity): 6 ng/L (ref <18)

## 2019-11-12 LAB — HEPATIC FUNCTION PANEL
ALT: 18 U/L (ref 0–44)
AST: 16 U/L (ref 15–41)
Albumin: 3.7 g/dL (ref 3.5–5.0)
Alkaline Phosphatase: 55 U/L (ref 38–126)
Bilirubin, Direct: <0.1 mg/dL (ref 0.0–0.2)
Total Bilirubin: 0.5 mg/dL (ref 0.3–1.2)
Total Protein: 8.9 g/dL — ABNORMAL HIGH (ref 6.5–8.1)

## 2019-11-12 LAB — LACTIC ACID, PLASMA: Lactic Acid, Venous: 1.2 mmol/L (ref 0.5–1.9)

## 2019-11-12 LAB — BASIC METABOLIC PANEL
Anion gap: 15 (ref 5–15)
BUN: 9 mg/dL (ref 6–20)
CO2: 30 mmol/L (ref 22–32)
Calcium: 9.9 mg/dL (ref 8.9–10.3)
Chloride: 90 mmol/L — ABNORMAL LOW (ref 98–111)
Creatinine, Ser: 0.81 mg/dL (ref 0.44–1.00)
GFR calc Af Amer: >60 mL/min (ref >60)
GFR calc non Af Amer: >60 mL/min (ref >60)
Glucose, Bld: 106 mg/dL — ABNORMAL HIGH (ref 70–99)
Potassium: 3.9 mmol/L (ref 3.5–5.1)
Sodium: 135 mmol/L (ref 135–145)

## 2019-11-12 LAB — CBC WITH DIFFERENTIAL/PLATELET
Abs Immature Granulocytes: 0.56 10*3/uL — ABNORMAL HIGH (ref 0.00–0.07)
Basophils Absolute: 0.1 10*3/uL (ref 0.0–0.1)
Basophils Relative: 0 %
Eosinophils Absolute: 0.2 10*3/uL (ref 0.0–0.5)
Eosinophils Relative: 1 %
HCT: 29.5 % — ABNORMAL LOW (ref 36.0–46.0)
Hemoglobin: 9.7 g/dL — ABNORMAL LOW (ref 12.0–15.0)
Immature Granulocytes: 2 %
Lymphocytes Relative: 11 %
Lymphs Abs: 4.1 10*3/uL — ABNORMAL HIGH (ref 0.7–4.0)
MCH: 25.1 pg — ABNORMAL LOW (ref 26.0–34.0)
MCHC: 32.9 g/dL (ref 30.0–36.0)
MCV: 76.4 fL — ABNORMAL LOW (ref 80.0–100.0)
Monocytes Absolute: 2.6 10*3/uL — ABNORMAL HIGH (ref 0.1–1.0)
Monocytes Relative: 7 %
Neutro Abs: 29.2 10*3/uL — ABNORMAL HIGH (ref 1.7–7.7)
Neutrophils Relative %: 79 %
Platelets: 1370 10*3/uL (ref 150–400)
RBC: 3.86 MIL/uL — ABNORMAL LOW (ref 3.87–5.11)
RDW: 18.2 % — ABNORMAL HIGH (ref 11.5–15.5)
Smear Review: INCREASED
WBC: 36.7 10*3/uL — ABNORMAL HIGH (ref 4.0–10.5)
nRBC: 0.1 % (ref 0.0–0.2)

## 2019-11-12 LAB — BLOOD GAS, VENOUS
Acid-Base Excess: 11.1 mmol/L — ABNORMAL HIGH (ref 0.0–2.0)
Bicarbonate: 35.8 mmol/L — ABNORMAL HIGH (ref 20.0–28.0)
FIO2: 0.21
O2 Saturation: 91.5 %
Patient temperature: 37
pCO2, Ven: 47 mmHg (ref 44.0–60.0)
pH, Ven: 7.49 — ABNORMAL HIGH (ref 7.250–7.430)
pO2, Ven: 57 mmHg — ABNORMAL HIGH (ref 32.0–45.0)

## 2019-11-12 LAB — BRAIN NATRIURETIC PEPTIDE: B Natriuretic Peptide: 28.7 pg/mL (ref 0.0–100.0)

## 2019-11-12 NOTE — ED Notes (Signed)
Pt given water and socks

## 2019-11-12 NOTE — ED Notes (Signed)
Paper copy of transfer consent obtained d/t signature pad not working.

## 2019-11-12 NOTE — ED Notes (Signed)
EMTALA and Medical Necessity documentation reviewed at this time and found to be complete per policy; electronic signature pad not working at time of pt's transfer (pt signed paper copy for scanning into medical record).

## 2019-11-12 NOTE — ED Triage Notes (Addendum)
Pt from home via [facility]. Per EMS, pt admitted to [facility] for PNA last night, pt left [facility] AMA this afternoon. Pt sent home with oxygen tank, oxygen ran out pt called [facility] with c/o SHOB.  pt at home hypoxic at 70's on RA- pt placed on 3L [place] sat 96%.  Pt sating 99% on 3L [place].  Pt able to stand and get on stretcher. NAD. Pt denies SHOB/CP at this time.  EDP Williams at bedside upon arrival.

## 2019-11-12 NOTE — ED Notes (Signed)
Called [facility] transfer center per Dr. Jimmye Norman pending call back from inpatient provider.

## 2019-11-12 NOTE — ED Provider Notes (Signed)
ER Provider Note       Time seen: 5:49 PM    I have reviewed the vital signs and the nursing notes.  HISTORY   Chief Complaint No chief complaint on file.    HPI Yesenia Carrillo is a 24 y.o. female with a history of COVID-19, hypertension, morbid obesity who presents today for dyspnea.  Patient was hypoxic at home into the 70s on room air.  Reportedly she left AMA from [place] yesterday.  She was on oxygen at that time.  Does not typically need home oxygen, has been having ongoing pneumonia and respiratory problems from Covid.  She denies fevers, chills, chest pain, cough, vomiting or diarrhea.  Patient states on oxygen she feels fine.  Past Medical History:  Diagnosis Date  . COVID-19 04/2019   patient reports diagnosis in Nov 2020  . Hypertension   . Morbid obesity with BMI of 40.0-44.9, adult [facility])     Past Surgical History:  Procedure Laterality Date  . TONSILLECTOMY    . WISDOM TOOTH EXTRACTION      Allergies Ampicillin and Penicillins  Review of Systems Constitutional: Negative for fever. Cardiovascular: Negative for chest pain. Respiratory: Positive for shortness of breath Gastrointestinal: Negative for abdominal pain, vomiting and diarrhea. Musculoskeletal: Negative for back pain. Skin: Negative for rash. Neurological: Negative for headaches, focal weakness or numbness.  All systems negative/normal/unremarkable except as stated in the HPI  ____________________________________________   PHYSICAL EXAM:  VITAL SIGNS: Vitals:   11/12/19 1755  BP: 102/81  Pulse: (!) 136  Resp: (!) 31  Temp: 99.6 F (37.6 C)  SpO2: 97%    Constitutional: Alert and oriented. Well appearing and in no distress. Eyes: Conjunctivae are normal. Normal extraocular movements. ENT      Head: Normocephalic and atraumatic.      Nose: No congestion/rhinnorhea.      Mouth/Throat: Mucous membranes are moist.      Neck: No stridor. Cardiovascular: Normal rate, regular rhythm. No  murmurs, rubs, or gallops. Respiratory: Normal respiratory effort without tachypnea nor retractions. Breath sounds are clear and equal bilaterally. No wheezes/rales/rhonchi. Gastrointestinal: Soft and nontender. Normal bowel sounds Musculoskeletal: Nontender with normal range of motion in extremities. No lower extremity tenderness nor edema. Neurologic:  Normal speech and language. No gross focal neurologic deficits are appreciated.  Skin:  Skin is warm, dry and intact. No rash noted. Psychiatric: Speech and behavior are normal.  ____________________________________________  EKG: Interpreted by me.  Sinus tachycardia with rate of 140 bpm, normal PR interval, normal QRS, normal QT  ____________________________________________   LABS (pertinent positives/negatives)  Labs Reviewed  CBC WITH DIFFERENTIAL/PLATELET - Abnormal; Notable for the following components:      Result Value   WBC 36.7 (*)    RBC 3.86 (*)    Hemoglobin 9.7 (*)    HCT 29.5 (*)    MCV 76.4 (*)    MCH 25.1 (*)    RDW 18.2 (*)    Platelets 1,370 (*)    Neutro Abs 29.2 (*)    Lymphs Abs 4.1 (*)    Monocytes Absolute 2.6 (*)    Abs Immature Granulocytes 0.56 (*)    All other components within normal limits  BASIC METABOLIC PANEL - Abnormal; Notable for the following components:   Chloride 90 (*)    Glucose, Bld 106 (*)    All other components within normal limits  BLOOD GAS, VENOUS - Abnormal; Notable for the following components:   pH, Ven 7.49 (*)  pO2, Ven 57.0 (*)    Bicarbonate 35.8 (*)    Acid-Base Excess 11.1 (*)    All other components within normal limits  HEPATIC FUNCTION PANEL - Abnormal; Notable for the following components:   Total Protein 8.9 (*)    All other components within normal limits  BRAIN NATRIURETIC PEPTIDE  PATHOLOGIST SMEAR REVIEW  LACTIC ACID, PLASMA  TROPONIN I (HIGH SENSITIVITY)   CRITICAL CARE Performed by: Laurence Aly   Total critical care time: 30  minutes  Critical care time was exclusive of separately billable procedures and treating other patients.  Critical care was necessary to treat or prevent imminent or life-threatening deterioration.  Critical care was time spent personally by me on the following activities: development of treatment plan with patient and/or surrogate as well as nursing, discussions with consultants, evaluation of patient's response to treatment, examination of patient, obtaining history from patient or surrogate, ordering and performing treatments and interventions, ordering and review of laboratory studies, ordering and review of radiographic studies, pulse oximetry and re-evaluation of patient's condition.  RADIOLOGY  Images were viewed by me Chest x-ray Does not reveal any acute process IMPRESSION: 1. Low lung volumes. 2. Central vascular congestion without airspace disease.  DIFFERENTIAL DIAGNOSIS  Pneumonia, COVID-19 long-haul or, CHF, COPD, pneumothorax, PE, lupus  ASSESSMENT AND PLAN  Dyspnea, hypoxia, thrombocytosis, leukocytosis   Plan: The patient had presented for dyspnea and hypoxia. Patient's labs revealed somewhat stable but chronic abnormalities including marked leukocytosis and marked thrombocytosis which appears to be increasing.  Chest x-ray revealed low lung volumes with central vascular congestion but no obvious airspace disease.  Have discussed with the [facility] transfer center.  She just left you today and she has been accepted back in transfer.  They did not recommend any antibiotics at this time.  She remains on 4 L nasal cannula oxygen.  Lenise Arena MD    Note: This note was generated in part or whole with voice recognition software. Voice recognition is usually quite accurate but there are transcription errors that can and very often do occur. I apologize for any typographical errors that were not detected and corrected.     Earleen Newport, MD 11/12/19 2005

## 2019-11-12 NOTE — ED Notes (Signed)
PT given blanket and TV remote by NT

## 2019-11-13 DIAGNOSIS — D75839 Thrombocytosis, unspecified: Secondary | ICD-10-CM | POA: Insufficient documentation

## 2019-11-13 LAB — PATHOLOGIST SMEAR REVIEW

## 2019-11-24 ENCOUNTER — Other Ambulatory Visit: Payer: Self-pay

## 2019-11-24 ENCOUNTER — Ambulatory Visit
Admission: EM | Admit: 2019-11-24 | Discharge: 2019-11-24 | Disposition: A | Discharge status: Home / Self Care | Payer: Medicaid Other | Attending: Emergency Medicine | Admitting: Emergency Medicine

## 2019-11-24 DIAGNOSIS — R202 Paresthesia of skin: Secondary | ICD-10-CM

## 2019-11-24 DIAGNOSIS — M79661 Pain in right lower leg: Secondary | ICD-10-CM | POA: Diagnosis not present

## 2019-11-24 NOTE — Discharge Instructions (Addendum)
I am concerned that you have a blood clot in your right leg even though you are on Eliquis.  Please go to the [place] or [place] ED for comprehensive work-up and further management.  As for your buttock and foot, I suspect that you have a pinched nerve.  This may benefit from pain medication and steroids.  However I will leave this up to the discretion of the provider who sees you.

## 2019-11-24 NOTE — ED Provider Notes (Signed)
HPI  SUBJECTIVE:  Yesenia Carrillo is a 24 y.o. female who presents with 2 issues.  First she reports constant, daily, right calf pain that is getting worse.  It Is been present for the past 1 to 2 weeks since discharge from the hospital.  She denies calf swelling, edema, leg weakness.  No change in physical activity.  No trauma to her leg or calf.  No hip or knee pain.  She has never had symptoms like this before.  She also reports right buttock and entire foot numbness and tingling.  There is no numbness or tingling going down her leg.  States that she had 2 x-rays of her foot both of which were negative.  No rash, color or temperature changes of her foot or buttock.  She reports mild right low back pain only with picking things up.  She denies back pain at any other time.  She denies chest pain, shortness of breath, hemoptysis.  She tried 1000 mg of Tylenol once without improvement in her symptoms.  Calf pain is worse with extending her knee.  There are no aggravating or alleviating factors for the numbness or tingling in her buttock/foot.  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  No history of zoster.  She got the varicella vaccine.  No history of back injury, sciatica, DVT.  Patient was admitted to [facility] 5/26 for idiopathic ARDS, multifocal pneumonia, PE status post 3 months of Eliquis.  Signed out AMA from the ICU on 6/2.  Seen in the ED on 6/9 and was transferred back to [facility] regular floor.  LMP: 6/1.  Denies the possibility of being pregnant.  PMD: [facility].   Past Medical History:  Diagnosis Date  . COVID-19 04/2019   patient reports diagnosis in Nov 2020  . Hypertension   . Morbid obesity with BMI of 40.0-44.9, adult [facility])     Past Surgical History:  Procedure Laterality Date   . TONSILLECTOMY    . WISDOM TOOTH EXTRACTION      History reviewed. No pertinent family history.  Social History   Tobacco Use  . Smoking status: Former Research scientist (life sciences)  . Smokeless tobacco: Never Used  Vaping Use  . Vaping Use: Never used  Substance Use Topics  . Alcohol use: Yes    Comment: monthly  . Drug use: No    No current facility-administered medications for this encounter.  Current Outpatient Medications:  .  albuterol (VENTOLIN HFA) 108 (90 Base) MCG/ACT inhaler, Inhale into the lungs., Disp: , Rfl:  .  apixaban (ELIQUIS) 5 MG TABS tablet, Take by mouth., Disp: , Rfl:  .  metoprolol tartrate (LOPRESSOR) 25 MG tablet, Take by mouth., Disp: , Rfl:   Allergies  Allergen Reactions  . Ampicillin Rash and Other (See Comments)    Patient and family cannot remember the specifics of the reaction.  Marland Kitchen Penicillins Hives     ROS  As noted in HPI.   Physical Exam  BP 129/87 (BP Location: Right Arm)   Pulse (!) 102   Temp 97.7 F (36.5 C) (Oral)   Resp 19   Ht 5\' 1"  (1.549 m)   Wt 102.1 kg   LMP 11/04/2019   SpO2 95%   BMI 42.51 kg/m   Constitutional: Well developed, well nourished, no acute distress Eyes:  EOMI, conjunctiva normal bilaterally HENT: Normocephalic,  atraumatic,mucus membranes moist Respiratory: Normal inspiratory effort Cardiovascular: Tachycardia GI: nondistended skin: No rash over the buttock or foot, skin intact Musculoskeletal: no deformities, right calf 48.5 cm, left calf 49 cm. positive tenderness in the popliteal region and along the midline of the right calf.  No palpable cord.  No tenderness over the foot, knee, ankle, hip.  No lumbar spine tenderness.  No paralumbar muscular tenderness bilaterally.  No pain with range of motion of the ankle, knee, hip.  KJ 2+ and equal bilaterally.  Strength 5/5 and equal bilaterally EHL, ankle, knee, hip.  Sensation grossly intact to light touch, temperature over the foot and buttock.  No tenderness over the  sciatic notch.  DP 2+.  Foot warm, pink.  Calf pain aggravated with full knee extension.  No pain with flexion.  No pain with dorsiflexion/plantarflexion.  Antalgic gait, but without ataxia. Neurologic: Alert & oriented x 3, no focal neuro deficits Psychiatric: Speech and behavior appropriate   ED Course   Medications - No data to display  No orders of the defined types were placed in this encounter.   No results found for this or any previous visit (from the past 24 hour(s)). No results found.  ED Clinical Impression  1. Right calf pain   2. Paresthesia of right foot   3. Paresthesia of buttock      ED Assessment/Plan  Outside records, labs reviewed.  As noted in HPI.  Concern for DVT in the right lower extremity despite being on Eliquis.  Discussed with her to go to the [place] or [place] ED for further work-up, encouraged her to go to [facility] since her care has primarily been there and they can do an apixaban assay.  Encouraged her to go today.  Feel that she is stable to go by private vehicle.  She agrees to go.  As for the paresthesias, it sounds like a pinched nerve.  Does not appear to be sciatica. no evidence of zoster.  Sensation to light touch and temperature intact over her entire foot and present but slightly diminished around her right gluteal cleft.  This may benefit from a steroid course if she goes home.  No orders of the defined types were placed in this encounter.   *This clinic note was created using Dragon dictation software. Therefore, there may be occasional mistakes despite careful proofreading.   ?    Melynda Ripple, MD 11/24/19 1714

## 2019-11-24 NOTE — ED Triage Notes (Signed)
Pt reports she had a prolonged hospitalization and was intubated. Released last week. Ever since she awoke and was extubated, she noticed her right buttock cheek feels numb and painful, right calf feels tight and right foot feels numb and painful. Had xray in the hospital for same which was negative (foot xray only).

## 2019-11-27 ENCOUNTER — Emergency Department: Admission: EM | Admit: 2019-11-27 | Payer: Medicaid Other | Source: Home / Self Care

## 2019-12-05 ENCOUNTER — Other Ambulatory Visit: Payer: Self-pay

## 2019-12-05 ENCOUNTER — Encounter: Payer: Self-pay | Admitting: Emergency Medicine

## 2019-12-05 ENCOUNTER — Ambulatory Visit
Admission: EM | Admit: 2019-12-05 | Discharge: 2019-12-05 | Disposition: A | Discharge status: Other Acute Inpatient Hospital | Payer: Medicaid Other

## 2019-12-05 DIAGNOSIS — M79661 Pain in right lower leg: Secondary | ICD-10-CM | POA: Diagnosis not present

## 2019-12-05 NOTE — ED Triage Notes (Signed)
Patient is being discharged from the Urgent Care and sent to the Emergency Department via POV . Per Zoe Lan, NP, patient is in need of higher level of care due to possible DVT. Patient is aware and verbalizes understanding of plan of care.  Vitals:   12/05/19 1513  BP: (!) 130/94  Pulse: (!) 131  Resp: 18  Temp: 98 F (36.7 C)  SpO2: 95%   Patient was seen at [facility] ED on 11/28/19 and was advised to return the next day to get an ultrasound. Patient never returned.

## 2019-12-05 NOTE — ED Triage Notes (Signed)
Patient in today c/o right hip, knee, foot and calf pain x 2-3 weeks. Patient seen at [facility] for same on 11/24/19.Patient was advised to go to ED for further evaluation. Patient went [facility] ED on 11/28/19 and was advised to return the next day for vascular ultrasound. Patient did not return for ultrasound due to transportation.

## 2019-12-05 NOTE — Discharge Instructions (Addendum)
Recommend go to [facility] now to have your ultrasound done to make sure you do not have a blood clot. Recommend follow-up with your doctor after the test results.

## 2019-12-07 ENCOUNTER — Emergency Department
Admission: EM | Admit: 2019-12-07 | Discharge: 2019-12-07 | Disposition: A | Discharge status: Home / Self Care | Payer: Medicaid Other | Attending: Emergency Medicine | Admitting: Emergency Medicine

## 2019-12-07 ENCOUNTER — Other Ambulatory Visit: Payer: Self-pay

## 2019-12-07 ENCOUNTER — Encounter: Payer: Self-pay | Admitting: Emergency Medicine

## 2019-12-07 DIAGNOSIS — M545 Low back pain: Secondary | ICD-10-CM | POA: Diagnosis not present

## 2019-12-07 DIAGNOSIS — Z5321 Procedure and treatment not carried out due to patient leaving prior to being seen by health care provider: Secondary | ICD-10-CM | POA: Insufficient documentation

## 2019-12-07 NOTE — ED Triage Notes (Signed)
Patient with complaint of pain to left lower back that radiates down her leg and into her foot. Patient reports that she has had the pain times 2-3 weeks.

## 2019-12-07 NOTE — ED Notes (Signed)
Pt was seen getting into car after triage by registration.

## 2019-12-11 NOTE — ED Provider Notes (Signed)
[facility]    CSN: [id number] Arrival date & time: 12/05/19  1501      History   Chief Complaint Chief Complaint  Patient presents with  . Knee Pain  . Hip Pain  . Foot Pain  . calf pain    HPI Yesenia Carrillo is a 24 y.o. female.   24 year old female presents with right sided pain that has continued for 2 to 3 weeks. She is having right sided knee/leg (calf) foot pain as well as right hip pain. She was seen here at Urgent Care on 11/24/19 with right leg pain and concern over possible DVT. She was first admitted to [facility] on 5/26 for ARDS, was transferred to ICU and left AMA on 11/05/19. She then went to the ER on 6/9 and was admitted back to [facility] on 6/9 with concern over thrombocytosis and pulmonary issues. She was referred to Pulmonology and Hematology. On 11/24/19 she was sent from Urgent Care to ER by private vehicle (patient was stable)- for further evaluation of possible DVT. She never went that day and then got a ride to ER at [facility] on 6/24 and waited but left without being seen. On 6/25 she went to [facility] ER in which they evaluated her and scheduled her for an ultrasound the next day (since ultrasound was not available at the time of her visit). She never returned for ultrasound. Went to her Pulmonology visit at [facility] on 6/28 in which they also ordered an Ultrasound to rule out DVT. She did not go back for either ultrasound and now shows up here at Urgent Care with continued right leg pain. Other chronic health issues include HTN and acute respiratory failure, along with pulmonary emboli previously mentioned. Currently on Metoprolol, Eliquis, Gabapentin daily and Albuterol prn.      Past Medical History:  Diagnosis Date  . COVID-19 04/2019   patient reports diagnosis in Nov 2020  . Hypertension   . Morbid obesity with BMI of 40.0-44.9, adult [facility])     Patient Active Problem List   Diagnosis Date Noted  . 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 Essential  hypertension 05/10/2019  . Pneumonia due to COVID-19 virus 05/06/2019  . COVID-19 05/04/2019  . Pregnancy 05/04/2019  . Acute cystitis during pregnancy 05/04/2019  . Sepsis ([place]) 11/05/2018  . Acute respiratory failure with hypoxia ([place])   . CAP (community acquired pneumonia) 04/30/2018    Past Surgical History:  Procedure Laterality Date  . TONSILLECTOMY    . WISDOM TOOTH EXTRACTION      OB History   No obstetric history on file.      Home Medications    Prior to Admission medications   Medication Sig Start Date End Date Taking? Authorizing Provider  albuterol (VENTOLIN HFA) 108 (90 Base) MCG/ACT inhaler Inhale into the lungs. 11/18/19 11/17/20 Yes [provider]  apixaban (ELIQUIS) 5 MG TABS tablet Take by mouth. 11/18/19 12/18/19 Yes [provider]  metoprolol tartrate (LOPRESSOR) 25 MG tablet Take by mouth. 11/18/19 12/18/19 Yes [provider]  gabapentin (NEURONTIN) 300 MG capsule Take 300 mg by mouth 3 (three) times daily. 11/28/19   [provider]    Family History Family History  Problem Relation Age of Onset  . Healthy Mother   . Healthy Father     Social History Social History   Tobacco Use  . Smoking status: Former Smoker    Years: 2.00    Types: Cigarettes    Quit  date: 12/05/2015    Years since quitting: 4.0  . Smokeless tobacco: Never Used  . Tobacco comment: 2 cig/day  Vaping Use  . Vaping Use: Never used  Substance Use Topics  . Alcohol use: Not Currently  . Drug use: No     Allergies   Ampicillin and Penicillins   Review of Systems Review of Systems  Constitutional: Positive for fatigue. Negative for activity change, chills and fever.  Respiratory: Negative for chest tightness (not currently) and wheezing.   Cardiovascular: Negative for leg swelling.  Musculoskeletal: Positive for arthralgias and myalgias.  Skin: Negative for color change, rash and wound.  Allergic/Immunologic: Negative for  environmental allergies and food allergies.     Physical Exam Triage Vital Signs ED Triage Vitals  Enc Vitals Group     BP 12/05/19 1513 (!) 130/94     Pulse Rate 12/05/19 1513 (!) 131     Resp 12/05/19 1513 18     Temp 12/05/19 1513 98 F (36.7 C)     Temp Source 12/05/19 1513 Oral     SpO2 12/05/19 1513 95 %     Weight 12/05/19 1515 230 lb (104.3 kg)     Height 12/05/19 1515 5\' 1"  (1.549 m)     Head Circumference --      Peak Flow --      Pain Score 12/05/19 1513 8     Pain Loc --      Pain Edu? --      Excl. in [place]? --    No data found.  Updated Vital Signs BP (!) 130/94 (BP Location: Left Arm)   Pulse (!) 131   Temp 98 F (36.7 C) (Oral)   Resp 18   Ht 5\' 1"  (1.549 m)   Wt 230 lb (104.3 kg)   LMP 11/04/2019 (Exact Date)   SpO2 95%   BMI 43.46 kg/m   Visual Acuity Right Eye Distance:   Left Eye Distance:   Bilateral Distance:    Right Eye Near:   Left Eye Near:    Bilateral Near:     Physical Exam Vitals and nursing note reviewed.  Constitutional:      General: She is awake. She is not in acute distress.    Appearance: She is well-developed and well-groomed. She is not ill-appearing.     Comments: Patient is sitting comfortably in the exam chair in no acute distress, speaking in complete sentences.   HENT:     Head: Normocephalic and atraumatic.  Cardiovascular:     Rate and Rhythm: Tachycardia present.  Pulmonary:     Effort: Pulmonary effort is normal.  Musculoskeletal:     Comments: No additional exam performed.   Neurological:     General: No focal deficit present.     Mental Status: She is alert and oriented to person, place, and time.  Psychiatric:        Attention and Perception: Attention normal.        Mood and Affect: Mood normal.        Speech: Speech normal.        Behavior: Behavior is cooperative.        Thought Content: Thought content normal.      UC Treatments / Results  Labs (all labs ordered are listed, but only  abnormal results are displayed) Labs Reviewed - No data to display  EKG   Radiology No results found.  Procedures Procedures (including critical care time)  Medications Ordered in UC Medications -  No data to display  Initial Impression / Assessment and Plan / UC Course  I have reviewed the triage vital signs and the nursing notes.  Pertinent labs & imaging results that were available during my care of the patient were reviewed by me and considered in my medical decision making (see chart for details).    Reviewed with patient that we do not have ultrasound currently available and that there is still an order in place at [facility] ER for an Ultrasound. Low probability of DVT since symptoms have continued for a few weeks but can not completely rule it out without additional testing. Discussed that she may have nerve pain and may need different medication and testing to address this issue with her PCP or Hematologist or Pulmonologist. Patient has a ride who can take her to [facility] ER now and recommend she go straight there NOW. Recommend follow-up with her PCP or other specialists as needed after evaluation.  Final Clinical Impressions(s) / UC Diagnoses   Final diagnoses:  Right calf pain     Discharge Instructions     Recommend go to [facility] now to have your ultrasound done to make sure you do not have a blood clot. Recommend follow-up with your doctor after the test results.     ED Prescriptions    None     PDMP not reviewed this encounter.   Katy Apo, NP 12/11/19 1737

## 2019-12-19 LAB — ACID FAST CULTURE WITH REFLEXED SENSITIVITIES (MYCOBACTERIA): Acid Fast Culture: NEGATIVE

## 2019-12-22 ENCOUNTER — Encounter: Payer: Self-pay | Admitting: Podiatry

## 2019-12-22 ENCOUNTER — Other Ambulatory Visit: Payer: Self-pay | Admitting: Podiatry

## 2019-12-22 ENCOUNTER — Other Ambulatory Visit: Payer: Self-pay

## 2019-12-22 ENCOUNTER — Ambulatory Visit (INDEPENDENT_AMBULATORY_CARE_PROVIDER_SITE_OTHER): Payer: Medicaid Other

## 2019-12-22 ENCOUNTER — Ambulatory Visit: Payer: Medicaid Other | Admitting: Podiatry

## 2019-12-22 DIAGNOSIS — M722 Plantar fascial fibromatosis: Secondary | ICD-10-CM

## 2019-12-22 DIAGNOSIS — M7750 Other enthesopathy of unspecified foot: Secondary | ICD-10-CM

## 2019-12-22 DIAGNOSIS — G5791 Unspecified mononeuropathy of right lower limb: Secondary | ICD-10-CM

## 2019-12-22 DIAGNOSIS — G5701 Lesion of sciatic nerve, right lower limb: Secondary | ICD-10-CM

## 2019-12-22 MED ORDER — METHYLPREDNISOLONE 4 MG PO TBPK
ORAL_TABLET | ORAL | 0 refills | Status: DC
Start: 2019-12-22 — End: 2021-09-05

## 2019-12-22 NOTE — Progress Notes (Signed)
Subjective:  Patient ID: Yesenia Carrillo, female    DOB: Sep 27, 1995,  MRN: [id number] HPI Chief Complaint  Patient presents with  . Foot Pain    Patient presents today for right foot/heel pain x 2-3 weeks.  She states "my toes feel like they cramp alot and when I walk its a sharp stabbing pain. feels like Im walking on pins and needles and it keeps me awake at night"  She was given Gabapentin from urgent care which has not helped.    24 y.o. female presents with the above complaint.   ROS: Denies fever chills nausea vomiting muscle aches pains calf pain back pain chest pain shortness of breath.  Past Medical History:  Diagnosis Date  . COVID-19 04/2019   patient reports diagnosis in Nov 2020  . Hypertension   . Morbid obesity with BMI of 40.0-44.9, adult [facility])    Past Surgical History:  Procedure Laterality Date  . TONSILLECTOMY    . WISDOM TOOTH EXTRACTION      Current Outpatient Medications:  .  apixaban (ELIQUIS) 5 MG TABS tablet, Take 5 mg by mouth 2 (two) times daily., Disp: , Rfl:  .  albuterol (VENTOLIN HFA) 108 (90 Base) MCG/ACT inhaler, Inhale into the lungs., Disp: , Rfl:  .  gabapentin (NEURONTIN) 300 MG capsule, Take 300 mg by mouth 3 (three) times daily., Disp: , Rfl:  .  methylPREDNISolone (MEDROL DOSEPAK) 4 MG TBPK tablet, 6 day dose pack - take as directed, Disp: 21 tablet, Rfl: 0  Allergies  Allergen Reactions  . Ampicillin Rash and Other (See Comments)    Patient and family cannot remember the specifics of the reaction.  Marland Kitchen Penicillins Hives   Review of Systems Objective:  There were no vitals filed for this visit.  General: Well developed, nourished, in no acute distress, alert and oriented x3   Dermatological: Skin is warm, dry and supple bilateral. Nails x 10 are well maintained; remaining integument appears unremarkable at this time. There are no open sores, no preulcerative lesions, no rash or signs of infection present.  Vascular: Dorsalis Pedis  artery and Posterior Tibial artery pedal pulses are 2/4 bilateral with immedate capillary fill time. Pedal hair growth present. No varicosities and no lower extremity edema present bilateral.   Neruologic: Grossly intact via light touch bilateral. Vibratory intact via tuning fork bilateral. Protective threshold with Semmes Wienstein monofilament intact to all pedal sites bilateral. Patellar and Achilles deep tendon reflexes 2+ bilateral. No Babinski or clonus noted bilateral.  Right hallux is numb.  The remainder of the toes are stiff and hard to move.  She has a positive straight leg test with radiating pains from her buttocks to her right foot  Musculoskeletal: No gross boney pedal deformities bilateral. No pain, crepitus, or limitation noted with foot and ankle range of motion bilateral. Muscular strength 5/5 in all groups tested bilateral.  She has pain on palpation medial calcaneal tubercle of the right heel bilaterally no pain on medial lateral compression of the calcaneus states that is hard for her to make her toes move which do appear to be stiff  Gait: Unassisted, Nonantalgic.    Radiographs:  Radiographs taken today demonstrate mild pes planus no soft tissue increase in density.  No acute findings.  Soft tissue margins appear to be normal osseous margins appear to be normal.  Assessment & Plan:   Assessment: Plan fasciitis and sciatic pain.  Cannot rule out neuropathy.  Plan: Nerve conduction velocity exam and EMG  to be ordered.  Injected her right heel today 20 mg Kenalog 5 mg of Marcaine 0 on methylprednisolone.  Discussed appropriate shoe gear stretching exercises ice therapy and shoe gear modifications.  We also discussed chiropractic medicine and that she may want to consider that prior to her EMG and NCVE.     Dearion Huot T. [place], [place]

## 2020-01-09 ENCOUNTER — Telehealth: Payer: Self-pay

## 2020-01-09 DIAGNOSIS — G5701 Lesion of sciatic nerve, right lower limb: Secondary | ICD-10-CM

## 2020-01-09 DIAGNOSIS — G5791 Unspecified mononeuropathy of right lower limb: Secondary | ICD-10-CM

## 2020-01-09 NOTE — Telephone Encounter (Signed)
-----   Message from Rip Harbour, [facility] sent at 12/22/2019  9:19 AM EDT ----- Regarding: Nerve conduction referral Nerve conduction/EMG right - sciatica/neuropathy right side

## 2020-01-12 ENCOUNTER — Telehealth: Payer: Self-pay

## 2020-01-12 NOTE — Telephone Encounter (Signed)
NCS has been scheduled at [facility] clinic Neurology for 02/18/2020 @ 2pm

## 2020-01-12 NOTE — Telephone Encounter (Signed)
-----   Message from Rip Harbour, [facility] sent at 12/22/2019  9:19 AM EDT ----- Regarding: Nerve conduction referral Nerve conduction/EMG right - sciatica/neuropathy right side

## 2020-01-22 ENCOUNTER — Ambulatory Visit: Payer: Medicaid Other | Admitting: Obstetrics and Gynecology

## 2020-02-06 ENCOUNTER — Encounter: Payer: Medicaid Other | Admitting: Obstetrics

## 2020-02-27 ENCOUNTER — Telehealth: Payer: Self-pay | Admitting: Obstetrics and Gynecology

## 2020-02-27 NOTE — Telephone Encounter (Signed)
Called pt to see if she can move her appt on 10/6 to 10/1 I couldn't  leave a message the voice mail is full

## 2020-03-03 ENCOUNTER — Encounter: Payer: Medicaid Other | Admitting: Obstetrics

## 2020-03-10 ENCOUNTER — Encounter: Payer: Self-pay | Admitting: Obstetrics and Gynecology

## 2020-03-12 ENCOUNTER — Ambulatory Visit
Admission: EM | Admit: 2020-03-12 | Discharge: 2020-03-12 | Disposition: A | Discharge status: Home / Self Care | Payer: Medicaid Other | Attending: Family Medicine | Admitting: Family Medicine

## 2020-03-12 ENCOUNTER — Encounter: Payer: Self-pay | Admitting: Emergency Medicine

## 2020-03-12 ENCOUNTER — Other Ambulatory Visit: Payer: Self-pay

## 2020-03-12 DIAGNOSIS — A6004 Herpesviral vulvovaginitis: Secondary | ICD-10-CM | POA: Diagnosis present

## 2020-03-12 DIAGNOSIS — N3001 Acute cystitis with hematuria: Secondary | ICD-10-CM | POA: Diagnosis not present

## 2020-03-12 DIAGNOSIS — B9689 Other specified bacterial agents as the cause of diseases classified elsewhere: Secondary | ICD-10-CM | POA: Diagnosis not present

## 2020-03-12 DIAGNOSIS — A609 Anogenital herpesviral infection, unspecified: Secondary | ICD-10-CM | POA: Diagnosis present

## 2020-03-12 DIAGNOSIS — N760 Acute vaginitis: Secondary | ICD-10-CM | POA: Insufficient documentation

## 2020-03-12 LAB — URINALYSIS, COMPLETE (UACMP) WITH MICROSCOPIC
Bilirubin Urine: NEGATIVE
Glucose, UA: NEGATIVE mg/dL
Ketones, ur: NEGATIVE mg/dL
Nitrite: NEGATIVE
Protein, ur: NEGATIVE mg/dL
Specific Gravity, Urine: 1.02 (ref 1.005–1.030)
WBC, UA: >50 WBC/hpf (ref 0–5)
pH: 6.5 (ref 5.0–8.0)

## 2020-03-12 LAB — WET PREP, GENITAL
Sperm: NONE SEEN
Trich, Wet Prep: NONE SEEN
Yeast Wet Prep HPF POC: NONE SEEN

## 2020-03-12 LAB — PREGNANCY, URINE: Preg Test, Ur: POSITIVE — AB

## 2020-03-12 MED ORDER — METRONIDAZOLE 500 MG PO TABS: 500.0000 mg | ORAL_TABLET | Freq: Two times a day (BID) | ORAL | 0 refills | Status: DC

## 2020-03-12 MED ORDER — VALACYCLOVIR HCL 1 G PO TABS: 1000.0000 mg | ORAL_TABLET | Freq: Two times a day (BID) | ORAL | 0 refills | Status: AC

## 2020-03-12 MED ORDER — CEPHALEXIN 500 MG PO CAPS: 500.0000 mg | ORAL_CAPSULE | Freq: Two times a day (BID) | ORAL | 0 refills | Status: AC

## 2020-03-12 NOTE — ED Provider Notes (Signed)
Care was provided under my supervision. I agree with the management as indicated in the note.  [place], [place], [place] 03/12/20 1747

## 2020-03-12 NOTE — ED Triage Notes (Signed)
Patient c/o white vaginal discharge and burning when urinating for the past 3 days.  Patient also reports some bumps on the outside of vagina.  Patient denies fevers.

## 2020-03-12 NOTE — ED Provider Notes (Signed)
[facility]    CSN: [id number] Arrival date & time: 03/12/20  1414    History   Chief Complaint Chief Complaint  Patient presents with  . Vaginal Discharge    HPI Yesenia Carrillo is a 24 y.o. female presents to urgent care with history of 3 days of white vaginal discharge, fishy odor, burning with urination which she says is worse "on the outside because it is overall".  She also reports some painful external vaginal bumps and some vaginal itch and burn.  She reports that she last had unprotected sexual intercourse 3-4 days ago.  Patient reported that her boyfriend is currently here and that he has pain around his penis under the foreskin which he says is due to changing his soap/detergent recently.  Of note the patient is a G2 P0 currently 8 weeks 0 days pregnant.  HPI  Past Medical History:  Diagnosis Date  . COVID-19 04/2019   patient reports diagnosis in Nov 2020  . Hypertension   . Morbid obesity with BMI of 40.0-44.9, adult [facility])     Patient Active Problem List   Diagnosis Date Noted  . BV (bacterial vaginosis) 03/12/2020  . HSV (herpes simplex virus) anogenital infection 03/12/2020  . Thrombocytosis 11/13/2019  . Hx pulmonary embolism 09/10/2019  . Hypotension (arterial) 09/10/2019  . 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 Essential hypertension 05/10/2019  . Pneumonia due to COVID-19 virus 05/06/2019  . COVID-19 05/04/2019  . Pregnancy 05/04/2019  . Acute cystitis with hematuria 05/04/2019  . Sepsis due to gram-negative urinary tract infection ([place]) 11/07/2018  . Sepsis ([place]) 11/05/2018  . Morbid obesity with BMI of 50.0-59.9, adult ([place]) 05/14/2018  . Acute respiratory failure with hypoxia ([place])   . CAP (community acquired pneumonia) 04/30/2018    Past Surgical History:  Procedure Laterality Date  . TONSILLECTOMY    . WISDOM TOOTH EXTRACTION      OB History    Gravida  1   Para      Term      Preterm      AB      Living         SAB      TAB      Ectopic      Multiple      Live Births               Home Medications    Prior to Admission medications   Medication Sig Start Date End Date Taking? Authorizing Provider  albuterol (VENTOLIN HFA) 108 (90 Base) MCG/ACT inhaler Inhale into the lungs. 11/18/19 11/17/20 Yes [provider]  Prenatal Vit-Fe Fumarate-FA (PNV PRENATAL PLUS MULTIVITAMIN) 27-1 MG TABS Take 1 tablet by mouth daily. 03/02/20  Yes [provider]  apixaban (ELIQUIS) 5 MG TABS tablet Take 5 mg by mouth 2 (two) times daily.    [provider]  cephALEXin (KEFLEX) 500 MG capsule Take 1 capsule (500 mg total) by mouth 2 (two) times daily for 5 days. 03/12/20 03/17/20  Daisy Floro, DO  gabapentin (NEURONTIN) 300 MG capsule Take 300 mg by mouth 3 (three) times daily. 11/28/19   [provider]  methylPREDNISolone (MEDROL DOSEPAK) 4 MG TBPK tablet 6 day dose pack - take as directed 12/22/19   Hyatt, Max T, DPM  metroNIDAZOLE (FLAGYL) 500 MG tablet Take 1 tablet (500 mg total) by mouth 2 (two) times daily. 03/12/20   Daisy Floro, DO  valACYclovir (VALTREX) 1000 MG tablet Take 1  tablet (1,000 mg total) by mouth 2 (two) times daily for 10 days. 03/12/20 03/22/20  Daisy Floro, DO    Family History Family History  Problem Relation Age of Onset  . Healthy Mother   . Healthy Father     Social History Social History   Tobacco Use  . Smoking status: Former Smoker    Years: 2.00    Types: Cigarettes    Quit date: 12/05/2015    Years since quitting: 4.2  . Smokeless tobacco: Never Used  . Tobacco comment: 2 cig/day  Vaping Use  . Vaping Use: Never used  Substance Use Topics  . Alcohol use: Not Currently  . Drug use: No     Allergies   Ampicillin and Penicillins   Review of Systems Review of Systems -see HPI   Physical Exam Triage Vital Signs ED Triage Vitals  Enc Vitals Group     BP 03/12/20 1450 120/88     Pulse Rate 03/12/20  1450 (!) 106     Resp 03/12/20 1450 14     Temp 03/12/20 1450 98.4 F (36.9 C)     Temp Source 03/12/20 1450 Oral     SpO2 03/12/20 1450 97 %     Weight 03/12/20 1447 243 lb (110.2 kg)     Height 03/12/20 1447 4\' 11"  (1.499 m)     Head Circumference --      Peak Flow --      Pain Score 03/12/20 1447 8     Pain Loc --      Pain Edu? --      Excl. in [place]? --    No data found.  Updated Vital Signs BP 120/88 (BP Location: Right Arm)   Pulse (!) 106   Temp 98.4 F (36.9 C) (Oral)   Resp 14   Ht 4\' 11"  (1.499 m)   Wt 243 lb (110.2 kg)   LMP 11/04/2019 (Exact Date)   SpO2 97%   BMI 49.08 kg/m   Physical exam: General: Pleasant patient, nontoxic-appearing Respiratory: CTA bilaterally,, work of breathing, speaking complete sentences GU: 4-5 red ulcerated lesions appreciated to posterior aspect of vaginal opening on labia majora, copious nonpurulent discharge appreciated in vaginal vault with slight green discoloration, no lesions appreciated to cervix, no bleeding appreciated   UC Treatments / Results  Labs (all labs ordered are listed, but only abnormal results are displayed) Labs Reviewed  WET PREP, GENITAL - Abnormal; Notable for the following components:      Result Value   Clue Cells Wet Prep HPF POC PRESENT (*)    WBC, Wet Prep HPF POC MODERATE (*)    All other components within normal limits  URINALYSIS, COMPLETE (UACMP) WITH MICROSCOPIC - Abnormal; Notable for the following components:   APPearance HAZY (*)    Hgb urine dipstick TRACE (*)    Leukocytes,Ua LARGE (*)    Bacteria, UA FEW (*)    All other components within normal limits  PREGNANCY, URINE - Abnormal; Notable for the following components:   Preg Test, Ur POSITIVE (*)    All other components within normal limits  CHLAMYDIA/NGC RT PCR ([facility] ONLY)  HSV CULTURE AND TYPING  URINE CULTURE    EKG   Radiology No results found.  Procedures Procedures (including critical care  time)  Medications Ordered in UC Medications - No data to display  Initial Impression / Assessment and Plan / UC Course  I have reviewed the triage vital signs and the nursing notes.  Pertinent labs & imaging results that were available during my care of the patient were reviewed by me and considered in my medical decision making (see chart for details).   Bacterial vaginitis  HSV: Patient with a fishy odor, increase in white discharge and painful bumps to the exterior aspect of the vaginal opening -Patient's physical exam high suspicious for herpes simplex virus, was prescribed Valtrex 1 g twice daily x10 days -Wet prep positive for clue cells and bacteria concerning for bacterial vaginosis, patient prescribed Flagyl 500 mg twice daily x5 days -HSV culture and typing collected however has not yet resulted  UTI: Patient's dipstick returned positive for trace hemoglobin, large leukocytes, and few bacteria.  However test also positive for many squamous epithelial cells. -Patient prescribed Keflex 500 mg twice daily x5 days -Urine culture ordered and performed, has not yet resulted  Final Clinical Impressions(s) / UC Diagnoses   Final diagnoses:  BV (bacterial vaginosis)  HSV (herpes simplex virus) anogenital infection  Acute cystitis with hematuria     Discharge Instructions     You have been prescribed the following medications:  -Valtrex 1000mg  1 tablet twice daily x10 days -Keflex 500mg  1 tablet twice daily x5 days -Flagyl 500mg  1 tablet twice daily x5 days   Please take all of these medications as prescribed and follow up with your PCP/OBGYN as soon as possible.   If your symptoms worsen and you develop fevers, body aches, chills, pelvic pain or vaginal bleeding please seek immediate medical attention.  Milus Banister, [place], PGY-3 03/12/2020 4:25 PM     ED Prescriptions    Medication Sig Dispense Auth. Provider   metroNIDAZOLE (FLAGYL) 500  MG tablet Take 1 tablet (500 mg total) by mouth 2 (two) times daily. 14 tablet Milus Banister C, DO   valACYclovir (VALTREX) 1000 MG tablet Take 1 tablet (1,000 mg total) by mouth 2 (two) times daily for 10 days. 20 tablet Milus Banister C, DO   cephALEXin (KEFLEX) 500 MG capsule Take 1 capsule (500 mg total) by mouth 2 (two) times daily for 5 days. 10 capsule Daisy Floro, DO     PDMP not reviewed this encounter.   Milus Banister, [place], PGY-3 03/12/2020 5:42 PM    Daisy Floro, DO 03/12/20 1742

## 2020-03-12 NOTE — Discharge Instructions (Addendum)
You have been prescribed the following medications:  -Valtrex 1000mg  1 tablet twice daily x10 days -Keflex 500mg  1 tablet twice daily x5 days -Flagyl 500mg  1 tablet twice daily x5 days   Please take all of these medications as prescribed and follow up with your PCP/OBGYN as soon as possible.   If your symptoms worsen and you develop fevers, body aches, chills, pelvic pain or vaginal bleeding please seek immediate medical attention.  Milus Banister, [place], PGY-3 03/12/2020 4:25 PM

## 2020-03-13 LAB — CHLAMYDIA/NGC RT PCR (ARMC ONLY)
Chlamydia Tr: NOT DETECTED
N gonorrhoeae: NOT DETECTED

## 2020-03-14 LAB — URINE CULTURE

## 2020-03-15 LAB — HSV CULTURE AND TYPING

## 2020-03-22 ENCOUNTER — Encounter: Payer: Medicaid Other | Admitting: Obstetrics

## 2020-11-11 IMAGING — DX DG ABD PORTABLE 1V
1 series · 1 of 1 positions shown · non-contrast
Comparison: None.

CLINICAL DATA: Evaluate OG tube placement

EXAM:
PORTABLE ABDOMEN - 1 VIEW

[abdomen supine]
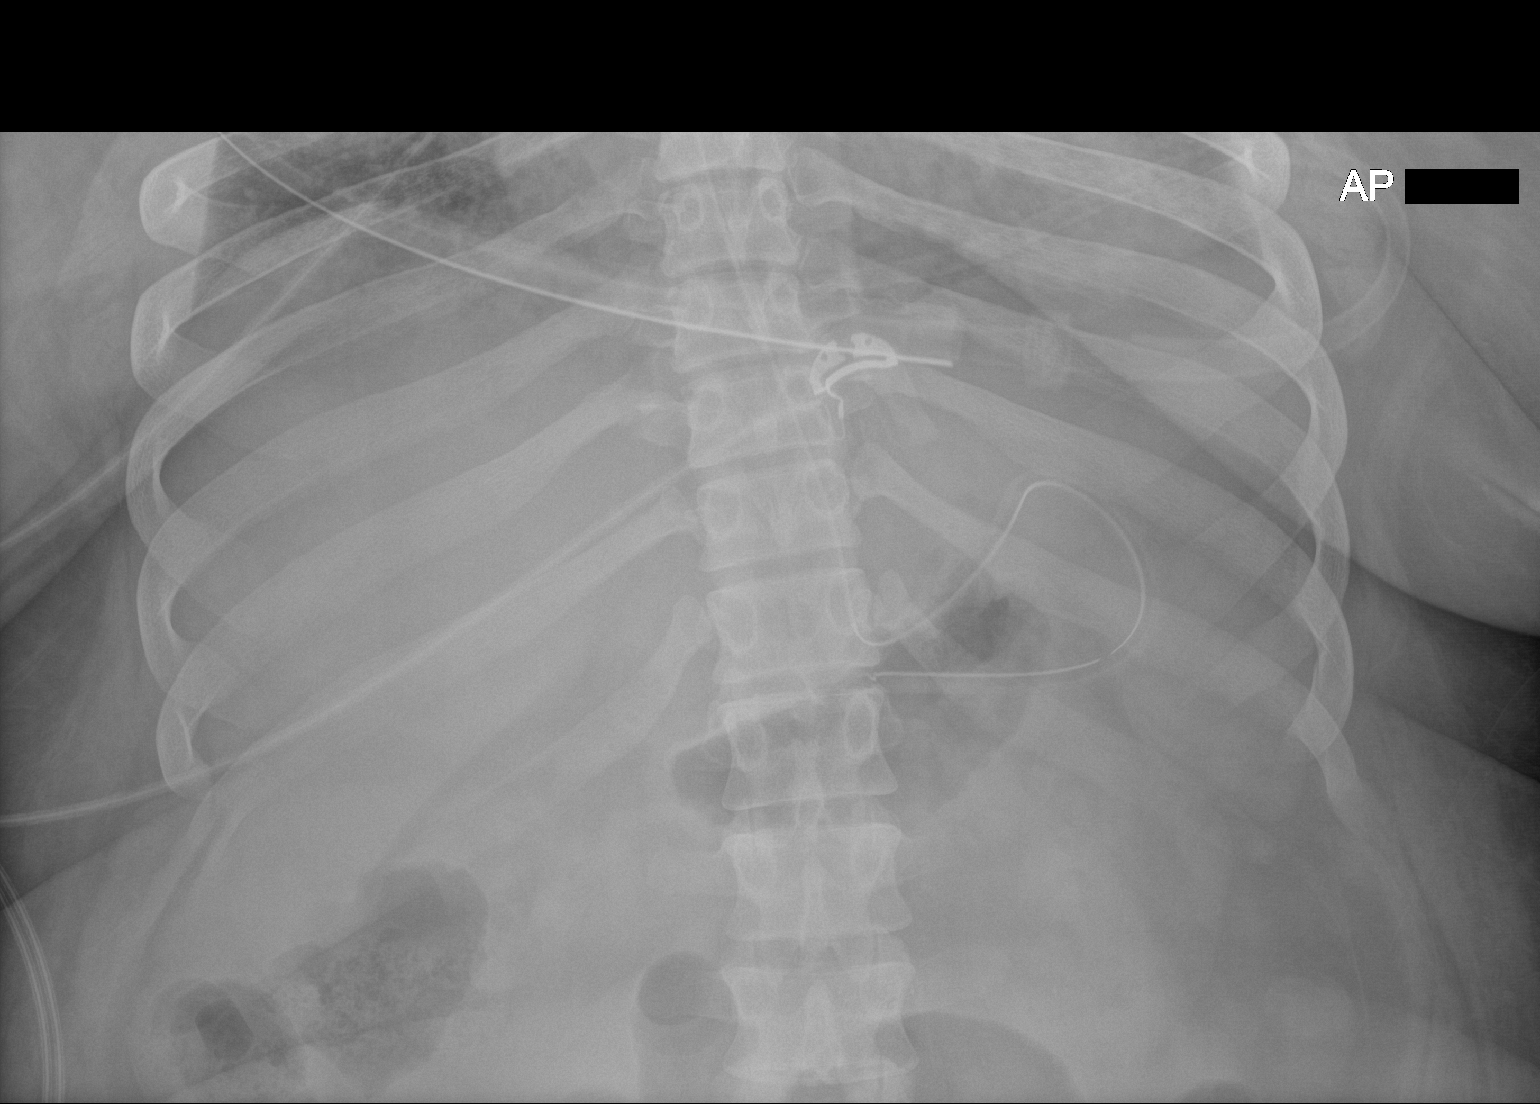

[1 of 1 positions shown; findings below may reference images not displayed]

FINDINGS: The OG tube terminates in the left upper quadrant, within the
stomach.
IMPRESSION: Appropriate OG tube placement on this study.

## 2020-11-11 IMAGING — CT CT ANGIO CHEST
2 of 6 series · 18 of 46 positions shown · IV contrast (APPLIED)
Comparison: Chest x-ray from earlier in the same day.

CLINICAL DATA: Shortness of breath and fever and chills

EXAM:
CT ANGIOGRAPHY CHEST WITH CONTRAST
TECHNIQUE: Multidetector CT imaging of the chest was performed using the
standard protocol during bolus administration of intravenous
contrast. Multiplanar CT image reconstructions and MIPs were
obtained to evaluate the vascular anatomy.
CONTRAST:  75mL R5JCPV-X4O IOPAMIDOL (R5JCPV-X4O) INJECTION 76%

[Series 5: thins · axial · 0.68mm/px · z∈[-225,+48]mm · 15 of 300 slices shown]
[im 14/300  lung]
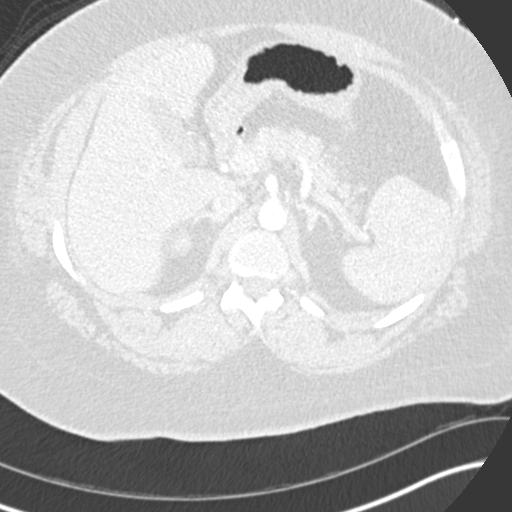
[im 40/300  soft-tissue]
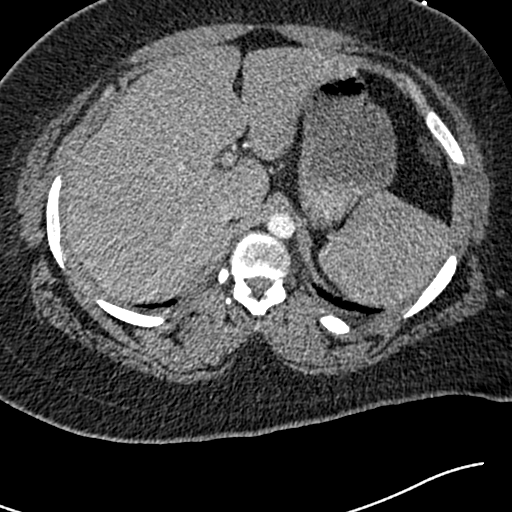
[im 53/300  lung]
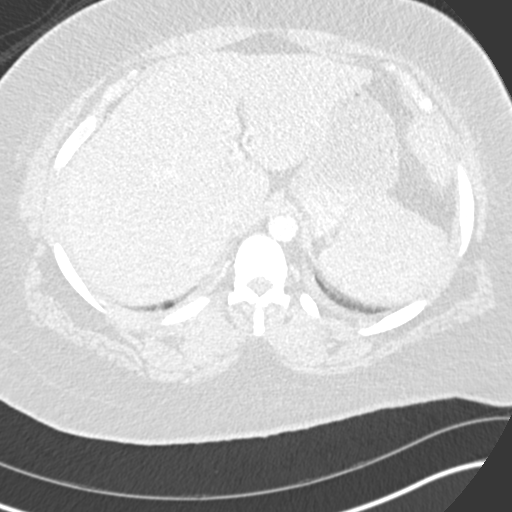
[im 79/300  soft-tissue]
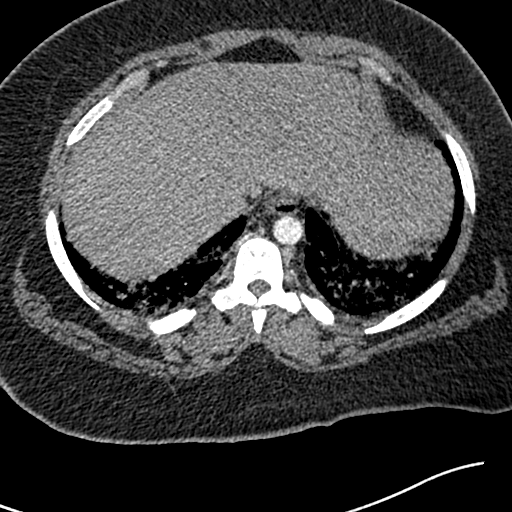
[im 92/300  lung]
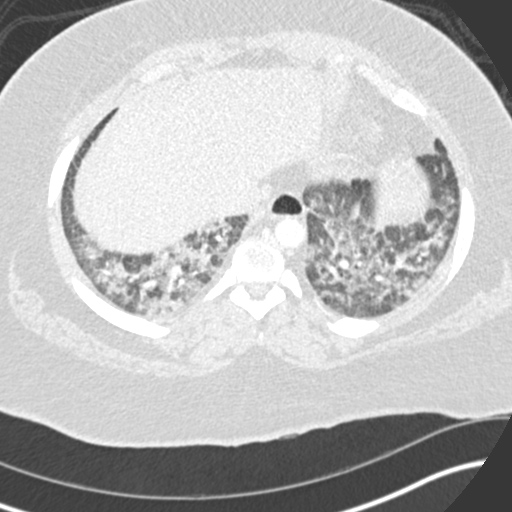
[im 118/300  soft-tissue]
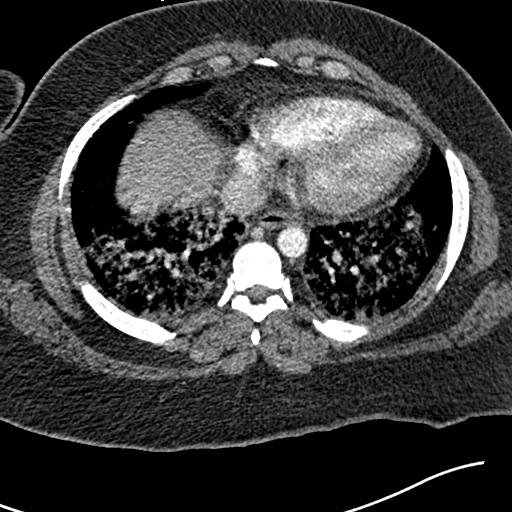
[im 131/300  lung]
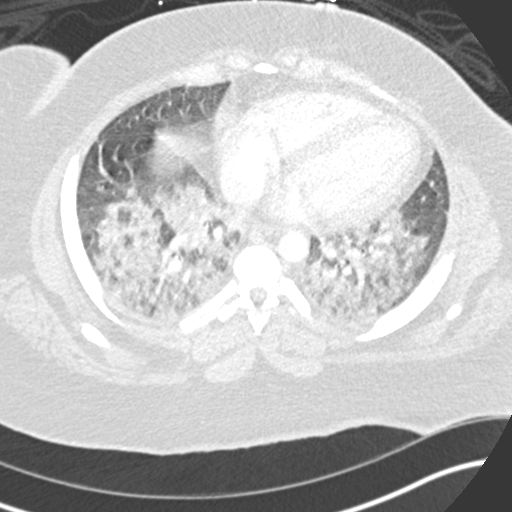
[im 157/300  soft-tissue]
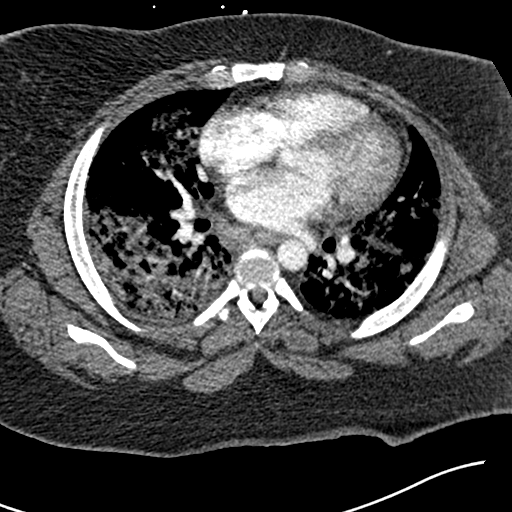
[im 170/300  lung]
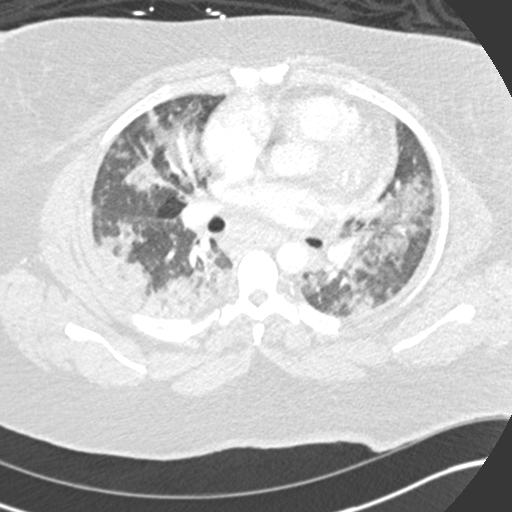
[im 183/300  soft-tissue]
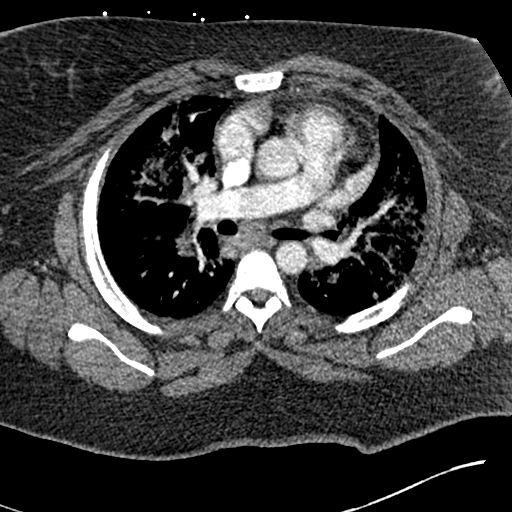
[im 209/300  lung]
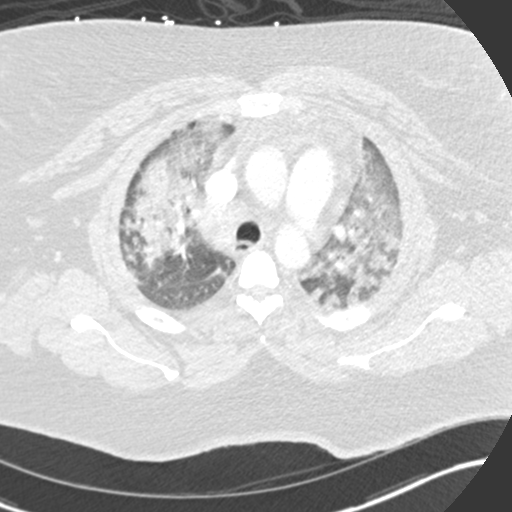
[im 222/300  soft-tissue]
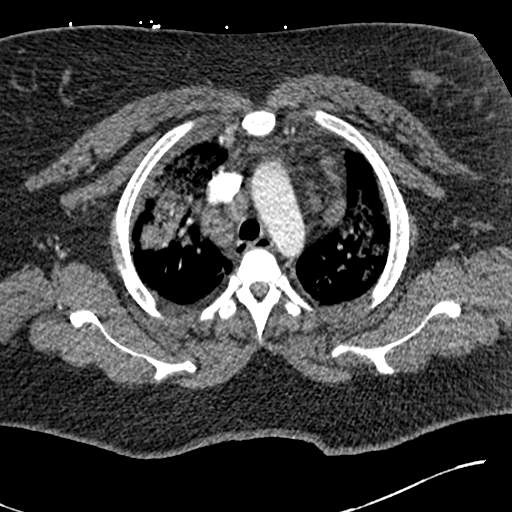
[im 248/300  lung]
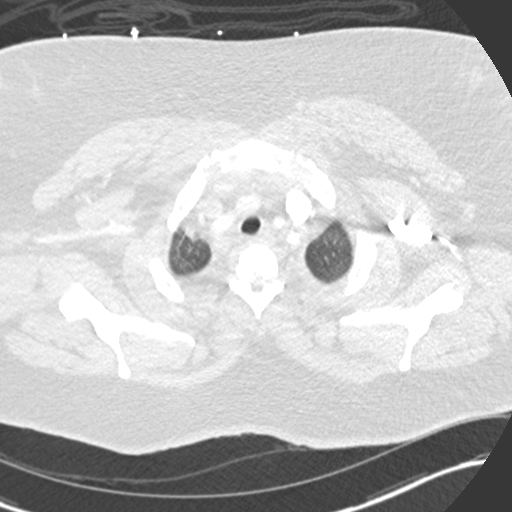
[im 261/300  soft-tissue]
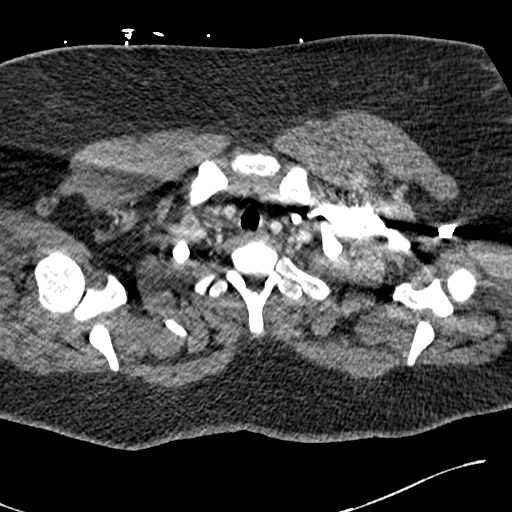
[im 287/300  lung]
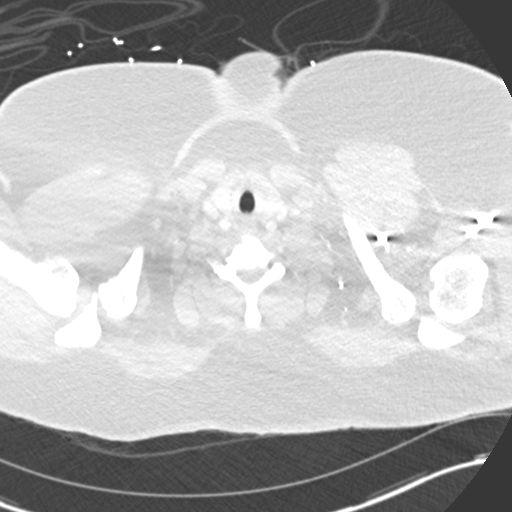

[Series 7: coronal mpr · coronal · 0.64mm/px · 3 of 109 slices shown]
[im 28/109  soft-tissue]
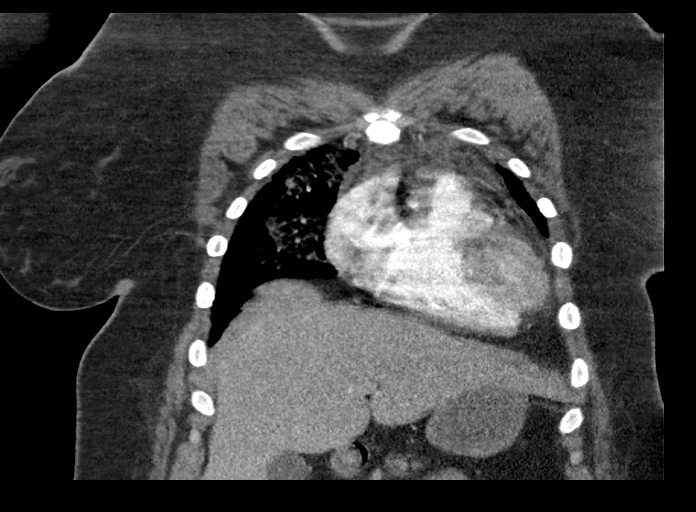
[im 55/109  soft-tissue]
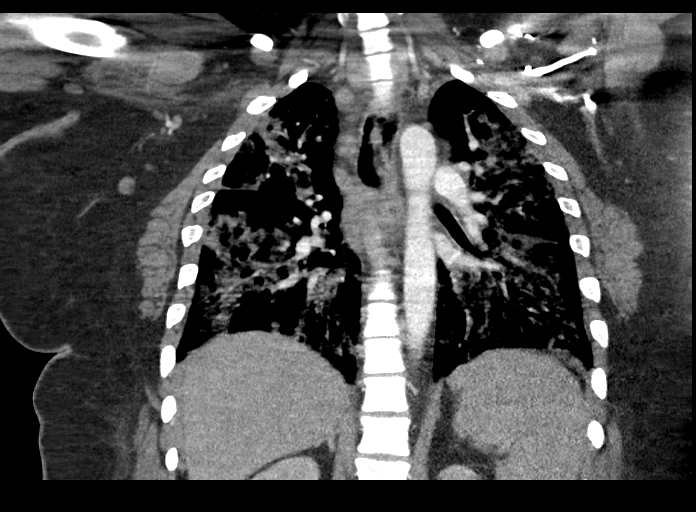
[im 82/109  soft-tissue]
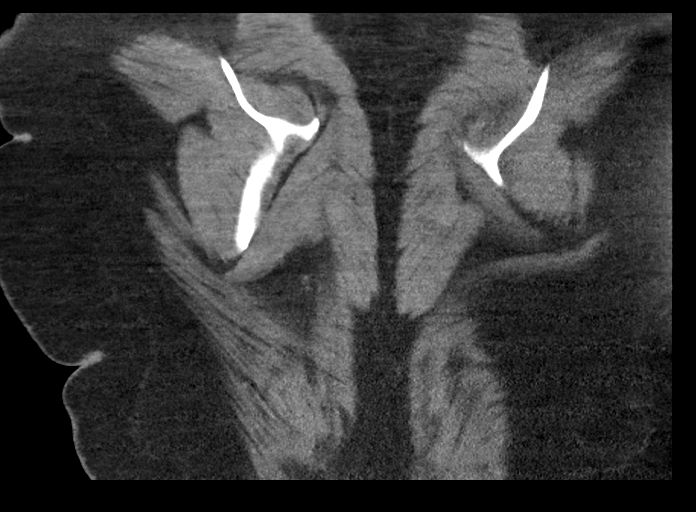

[18 of 46 positions shown; findings below may reference images not displayed]

FINDINGS: Cardiovascular: Thoracic aorta demonstrates a normal branching
pattern. No atherosclerotic calcifications are seen. No aneurysmal
dilatation or dissection is noted. Cardiac size is at the upper
limits of normal. The pulmonary artery shows a normal branching
pattern with normal opacification. No findings to suggest pulmonary
embolism are seen.

Mediastinum/Nodes: Thoracic inlet is within normal limits. Scattered
lymph nodes are noted throughout the mediastinum and hila likely
reactive in nature given the diffuse pulmonary infiltrates. The
largest of these in the right paratracheal region measures 19 mm in
short axis. In the AP window a 16 mm short axis lymph node is seen.
In the subcarinal region a 19 mm short axis node is noted. The
esophagus is within normal limits.

Lungs/Pleura: Lungs are well aerated bilaterally but demonstrate
diffuse bilateral infiltrates most consolidative within the right
upper lobe similar to that seen on prior plain film examination. No
sizable effusion is seen. No distinct nodules are seen.

Upper Abdomen: Visualized upper abdomen is within normal limits.

Musculoskeletal: Bony structures are within normal limits.

Review of the MIP images confirms the above findings.
IMPRESSION: Diffuse bilateral multifocal pneumonia with associated hilar and
mediastinal adenopathy. These changes are similar to that seen on
recent plain film examination.

No evidence of pulmonary emboli.

## 2020-11-12 IMAGING — DX DG CHEST 1V PORT
1 series · 1 of 1 positions shown · non-contrast
Comparison: Prior radiograph from 05/02/2018

CLINICAL DATA: Initial evaluation for hypoxia.

EXAM:
PORTABLE CHEST 1 VIEW

[chest ap]
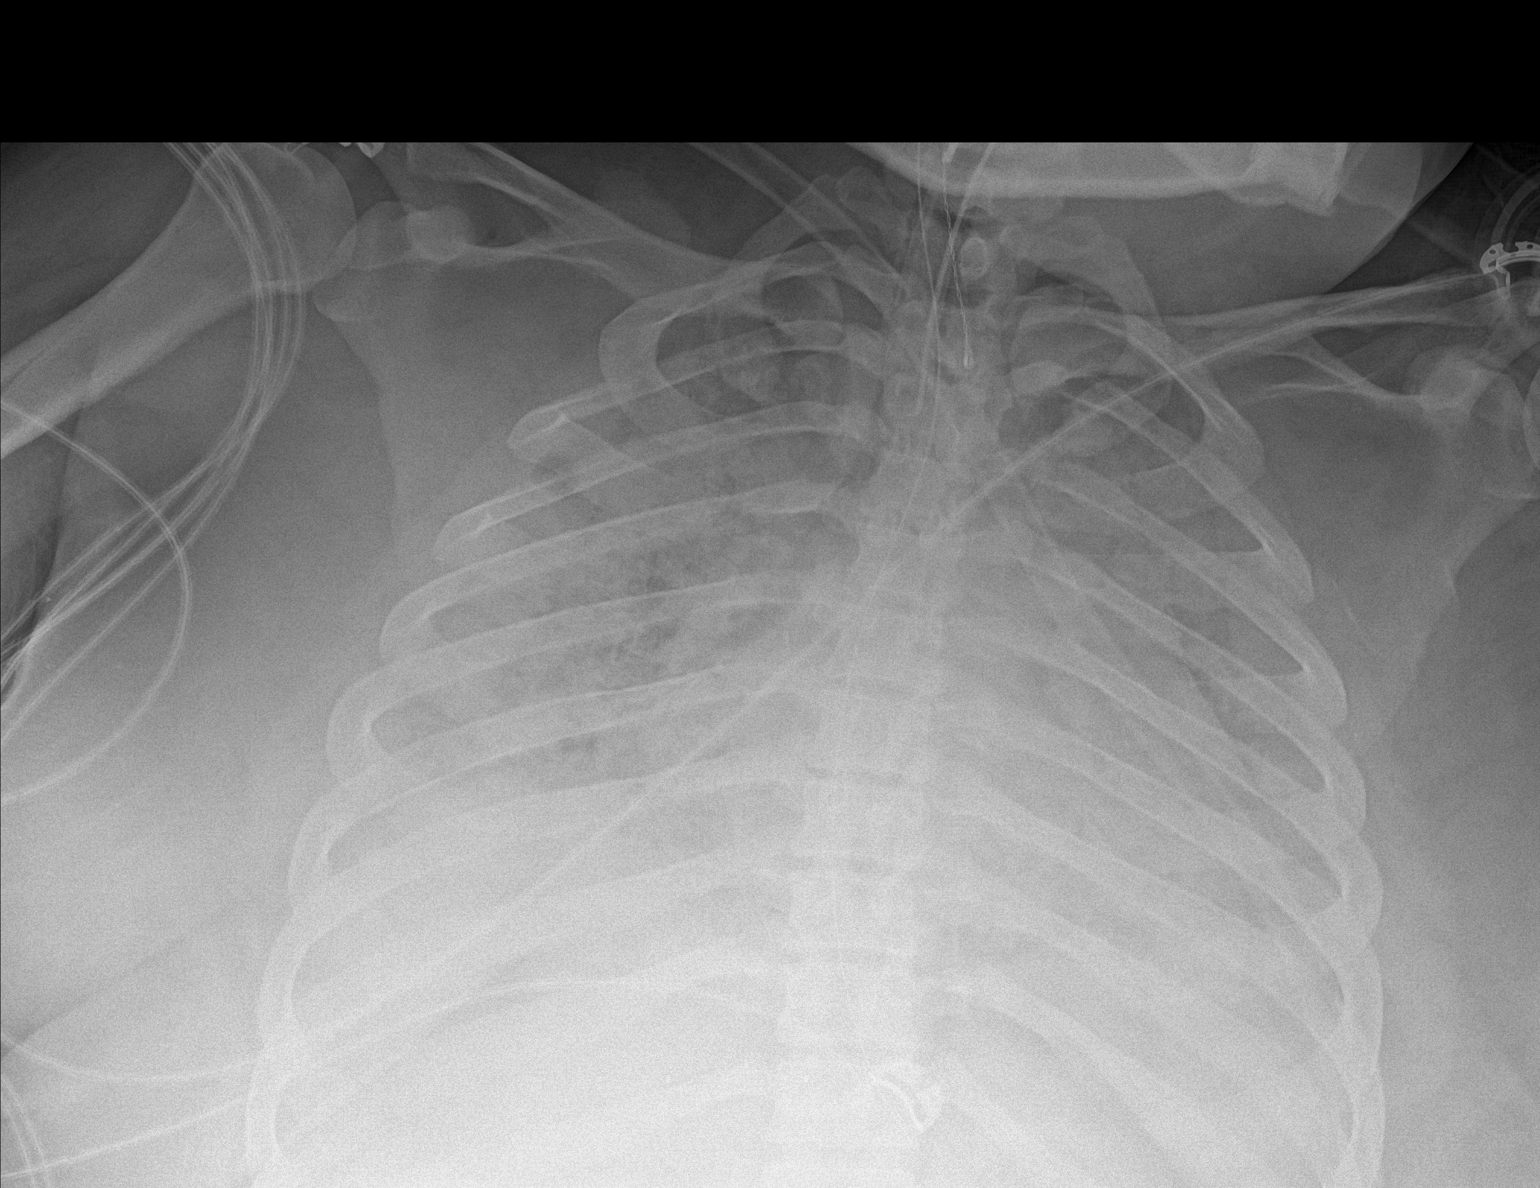

[1 of 1 positions shown; findings below may reference images not displayed]

FINDINGS: Endotracheal tube remains in place with tip positioned approximately
2.8 cm above the carina. Enteric tube courses into the abdomen.
Cardiac and mediastinal silhouettes grossly stable.

Lungs mildly hypoinflated. Extensive diffuse bilateral pulmonary
airspace disease, stable to perhaps slightly worsened from previous.
Suspected small bilateral pleural effusions. No pneumothorax.

No acute osseous abnormality.
IMPRESSION: 1. Tip of the endotracheal tube appropriately positioned above the
carina.
2. Persistent extensive diffuse bilateral pulmonary airspace
disease, stable to perhaps slightly worsened from previous.
3. Suspected small bilateral pleural effusions.

## 2021-09-05 ENCOUNTER — Emergency Department
Admission: EM | Admit: 2021-09-05 | Discharge: 2021-09-05 | Disposition: A | Discharge status: Home / Self Care | Payer: Medicaid Other | Attending: Emergency Medicine | Admitting: Emergency Medicine

## 2021-09-05 ENCOUNTER — Ambulatory Visit
Admission: EM | Admit: 2021-09-05 | Discharge: 2021-09-05 | Disposition: A | Discharge status: Home / Self Care | Payer: Medicaid Other

## 2021-09-05 ENCOUNTER — Other Ambulatory Visit: Payer: Self-pay

## 2021-09-05 DIAGNOSIS — R140 Abdominal distension (gaseous): Secondary | ICD-10-CM | POA: Insufficient documentation

## 2021-09-05 DIAGNOSIS — R0602 Shortness of breath: Secondary | ICD-10-CM | POA: Diagnosis not present

## 2021-09-05 DIAGNOSIS — Z5321 Procedure and treatment not carried out due to patient leaving prior to being seen by health care provider: Secondary | ICD-10-CM | POA: Insufficient documentation

## 2021-09-05 DIAGNOSIS — R1013 Epigastric pain: Secondary | ICD-10-CM | POA: Diagnosis not present

## 2021-09-05 HISTORY — DX: Interstitial pulmonary disease, unspecified: J84.9

## 2021-09-05 HISTORY — DX: Unspecified pre-eclampsia, unspecified trimester: O14.90

## 2021-09-05 LAB — CBC
HCT: 47.9 % — ABNORMAL HIGH (ref 36.0–46.0)
Hemoglobin: 16 g/dL — ABNORMAL HIGH (ref 12.0–15.0)
MCH: 24 pg — ABNORMAL LOW (ref 26.0–34.0)
MCHC: 33.4 g/dL (ref 30.0–36.0)
MCV: 71.7 fL — ABNORMAL LOW (ref 80.0–100.0)
Platelets: 420 10*3/uL — ABNORMAL HIGH (ref 150–400)
RBC: 6.68 MIL/uL — ABNORMAL HIGH (ref 3.87–5.11)
RDW: 24.7 % — ABNORMAL HIGH (ref 11.5–15.5)
WBC: 10.3 10*3/uL (ref 4.0–10.5)
nRBC: 0 % (ref 0.0–0.2)

## 2021-09-05 LAB — COMPREHENSIVE METABOLIC PANEL
ALT: 16 U/L (ref 0–44)
AST: 19 U/L (ref 15–41)
Albumin: 4.2 g/dL (ref 3.5–5.0)
Alkaline Phosphatase: 73 U/L (ref 38–126)
Anion gap: 8 (ref 5–15)
BUN: 9 mg/dL (ref 6–20)
CO2: 25 mmol/L (ref 22–32)
Calcium: 9 mg/dL (ref 8.9–10.3)
Chloride: 102 mmol/L (ref 98–111)
Creatinine, Ser: 0.6 mg/dL (ref 0.44–1.00)
GFR, Estimated: >60 mL/min (ref >60)
Glucose, Bld: 80 mg/dL (ref 70–99)
Potassium: 3.5 mmol/L (ref 3.5–5.1)
Sodium: 135 mmol/L (ref 135–145)
Total Bilirubin: 1.2 mg/dL (ref 0.3–1.2)
Total Protein: 7.7 g/dL (ref 6.5–8.1)

## 2021-09-05 LAB — LIPASE, BLOOD: Lipase: 23 U/L (ref 11–51)

## 2021-09-05 NOTE — ED Triage Notes (Signed)
Pt comes into the ED via EMS from [facility] urgent care, states she is on 6L [place] at home and is off O2 while at work was in the 60's. States she went to the urgent care for Bloating/pressure in the epigastric area since last week. ? ?ST 116 ?98 on 6L [place] ?98.7 ?130/94 ?

## 2021-09-05 NOTE — Discharge Instructions (Addendum)
Sent to ED via EMS  ?

## 2021-09-05 NOTE — ED Provider Notes (Signed)
?[place] ? ? ? ?CSN: [id number] ?Arrival date & time: 09/05/21  1557 ? ? ?  ? ?History   ?Chief Complaint ?Chief Complaint  ?Patient presents with  ? Cough  ? Gastroesophageal Reflux  ? ? ?HPI ?TEREASE MARCOTTE is a 26 y.o. female presenting with shortness of breath and cough for few days.  Cough is nonproductive.  History of pneumonia, pulmonary embolism, long-term anticoagulation, chronic heart failure, sepsis, respiratory failure.  Describes shortness of breath, dyspnea on exertion, nonproductive cough.  She is on 8 L of oxygen at baseline at home, but she did not bring this to her visit.  Also states that she is having abdominal pain, worse in the epigastric region.  Was diagnosed with GERD at PCP 1 week ago.  Denies current chest pain, dizziness, weakness. ? ?HPI ? ?Past Medical History:  ?Diagnosis Date  ? COVID-19 04/2019  ? patient reports diagnosis in Nov 2020  ? Hypertension   ? Morbid obesity with BMI of 40.0-44.9, adult ([place])   ? ? ?Patient Active Problem List  ? Diagnosis Date Noted  ? BV (bacterial vaginosis) 03/12/2020  ? HSV (herpes simplex virus) anogenital infection 03/12/2020  ? Thrombocytosis 11/13/2019  ? Hx pulmonary embolism 09/10/2019  ? Hypotension (arterial) 09/10/2019  ? 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 Essential hypertension 05/10/2019  ? Pneumonia due to COVID-19 virus 05/06/2019  ? COVID-19 05/04/2019  ? Pregnancy 05/04/2019  ? Acute cystitis with hematuria 05/04/2019  ? Sepsis due to gram-negative urinary tract infection ([place]) 11/07/2018  ? Sepsis ([place]) 11/05/2018  ? Morbid obesity with BMI of 50.0-59.9, adult ([place]) 05/14/2018  ? Acute respiratory failure with hypoxia ([place])   ? CAP (community acquired pneumonia) 04/30/2018  ? ? ?Past Surgical History:  ?Procedure Laterality Date  ? TONSILLECTOMY    ? WISDOM TOOTH EXTRACTION    ? ? ?OB History   ? ? Gravida  ?1  ? Para  ?   ? Term  ?   ? Preterm  ?   ? AB  ?   ? Living  ?   ?  ? ? SAB  ?   ? IAB  ?   ? Ectopic  ?   ?  Multiple  ?   ? Live Births  ?   ?   ?  ?  ? ? ? ?Home Medications   ? ?Prior to Admission medications   ?Medication Sig Start Date End Date Taking? Authorizing Provider  ?albuterol (VENTOLIN HFA) 108 (90 Base) MCG/ACT inhaler Inhale into the lungs. 07/25/21  Yes [provider]  ?amLODipine (NORVASC) 10 MG tablet Take 1 tablet by mouth daily. 10/19/20  Yes [provider]  ?azithromycin (ZITHROMAX) 250 MG tablet Take 250 mg by mouth daily. 07/26/21  Yes [provider]  ?empagliflozin (JARDIANCE) 10 MG TABS tablet Take 1 tablet by mouth daily. 10/19/20  Yes [provider]  ?famotidine (PEPCID) 20 MG tablet Take by mouth. 08/29/21 08/29/22 Yes [provider]  ?furosemide (LASIX) 40 MG tablet Take 1 tablet by mouth daily. 07/12/21  Yes [provider]  ? ? ?Family History ?Family History  ?Problem Relation Age of Onset  ? Healthy Mother   ? Healthy Father   ? ? ?Social History ?Social History  ? ?Tobacco Use  ? Smoking status: Former  ?  Years: 2.00  ?  Types: Cigarettes  ?  Quit date: 12/05/2015  ?  Years since quitting: 5.7  ? Smokeless tobacco: Never  ?  Tobacco comments:  ?  2 cig/day  ?Vaping Use  ? Vaping Use: Never used  ?Substance Use Topics  ? Alcohol use: Not Currently  ? Drug use: No  ? ? ? ?Allergies   ?Ampicillin and Penicillins ? ? ?Review of Systems ?Review of Systems  ?Constitutional:  Negative for appetite change, chills and fever.  ?HENT:  Positive for congestion. Negative for ear pain, rhinorrhea, sinus pressure, sinus pain and sore throat.   ?Eyes:  Negative for redness and visual disturbance.  ?Respiratory:  Positive for cough and shortness of breath. Negative for chest tightness and wheezing.   ?Cardiovascular:  Negative for chest pain and palpitations.  ?Gastrointestinal:  Positive for abdominal pain. Negative for constipation, diarrhea, nausea and vomiting.  ?Genitourinary:  Negative for dysuria, frequency and urgency.  ?Musculoskeletal:  Negative  for myalgias.  ?Neurological:  Negative for dizziness, weakness and headaches.  ?Psychiatric/Behavioral:  Negative for confusion.   ?All other systems reviewed and are negative. ? ? ?Physical Exam ?Triage Vital Signs ?ED Triage Vitals  ?Enc Vitals Group  ?   BP 09/05/21 1658 (!) 130/94  ?   Pulse Rate 09/05/21 1658 (!) 121  ?   Resp --   ?   Temp 09/05/21 1658 98.6 ?F (37 ?C)  ?   Temp Source 09/05/21 1658 Oral  ?   SpO2 09/05/21 1658 (!) 62 %  ?   Weight --   ?   Height --   ?   Head Circumference --   ?   Peak Flow --   ?   Pain Score 09/05/21 1655 6  ?   Pain Loc --   ?   Pain Edu? --   ?   Excl. in [place]? --   ? ?No data found. ? ?Updated Vital Signs ?BP (!) 130/94 (BP Location: Left Arm)   Pulse (!) 121   Temp 98.6 ?F (37 ?C) (Oral)   LMP 08/17/2021 (Exact Date)   SpO2 (!) 62%  ? ?Visual Acuity ?Right Eye Distance:   ?Left Eye Distance:   ?Bilateral Distance:   ? ?Right Eye Near:   ?Left Eye Near:    ?Bilateral Near:    ? ?Physical Exam ?Vitals reviewed.  ?Constitutional:   ?   General: She is not in acute distress. ?   Appearance: Normal appearance. She is not ill-appearing.  ?HENT:  ?   Head: Normocephalic and atraumatic.  ?   Right Ear: Tympanic membrane, ear canal and external ear normal. No tenderness. No middle ear effusion. There is no impacted cerumen. Tympanic membrane is not perforated, erythematous, retracted or bulging.  ?   Left Ear: Tympanic membrane, ear canal and external ear normal. No tenderness.  No middle ear effusion. There is no impacted cerumen. Tympanic membrane is not perforated, erythematous, retracted or bulging.  ?   Nose: Nose normal. No congestion.  ?   Mouth/Throat:  ?   Mouth: Mucous membranes are moist.  ?   Pharynx: Uvula midline. No oropharyngeal exudate or posterior oropharyngeal erythema.  ?Eyes:  ?   Extraocular Movements: Extraocular movements intact.  ?   Pupils: Pupils are equal, round, and reactive to light.  ?Cardiovascular:  ?   Rate and Rhythm: Normal rate and  regular rhythm.  ?   Heart sounds: Normal heart sounds.  ?Pulmonary:  ?   Effort: Pulmonary effort is normal.  ?   Breath sounds: Decreased breath sounds present. No wheezing, rhonchi or rales.  ?   Comments:  BS decreased throughout  ?Abdominal:  ?   Palpations: Abdomen is soft.  ?   Tenderness: There is abdominal tenderness in the epigastric area. There is no guarding or rebound.  ?Lymphadenopathy:  ?   Cervical: No cervical adenopathy.  ?   Right cervical: No superficial cervical adenopathy. ?   Left cervical: No superficial cervical adenopathy.  ?Neurological:  ?   General: No focal deficit present.  ?   Mental Status: She is alert and oriented to person, place, and time.  ?Psychiatric:     ?   Mood and Affect: Mood normal.     ?   Behavior: Behavior normal.     ?   Thought Content: Thought content normal.     ?   Judgment: Judgment normal.  ? ? ? ?UC Treatments / Results  ?Labs ?(all labs ordered are listed, but only abnormal results are displayed) ?Labs Reviewed - No data to display ? ?EKG ? ? ?Radiology ?No results found. ? ?Procedures ?Procedures (including critical care time) ? ?Medications Ordered in UC ?Medications - No data to display ? ?Initial Impression / Assessment and Plan / UC Course  ?I have reviewed the triage vital signs and the nursing notes. ? ?Pertinent labs & imaging results that were available during my care of the patient were reviewed by me and considered in my medical decision making (see chart for details). ? ?  ? ?This patient is a very pleasant 26 y.o. year old female presenting with shortness of breath. History respiratory failure, CAP, interstitial lung disease. On 8L home oxygen but did not bring this to visit. Oxygen ranging 53-62% on room air. Placed on 8L oxygen via nasal cannula which increased O2 to 100%. Given severity of symptoms and history of respiratory failure, sent to ED via EMS. She is in agreement. ? ?Final Clinical Impressions(s) / UC Diagnoses  ? ?Final diagnoses:   ?Shortness of breath  ? ? ? ?Discharge Instructions   ? ?  ?Sent to ED via EMS  ? ? ? ? ?ED Prescriptions   ?None ?  ? ?PDMP not reviewed this encounter. ?  ?Hazel Sams, PA-C ?09/05/21 1722 ? ?

## 2021-09-05 NOTE — ED Triage Notes (Signed)
Patient states she seen her provider last week and was DX with GERD. Medication has not been helping. Patient c.o pain when having a BM. Last BM was today.  ?

## 2022-03-18 ENCOUNTER — Other Ambulatory Visit: Payer: Self-pay

## 2022-03-18 ENCOUNTER — Emergency Department
Admission: EM | Admit: 2022-03-18 | Discharge: 2022-03-18 | Disposition: A | Discharge status: Home / Self Care | Payer: Medicaid Other | Attending: Emergency Medicine | Admitting: Emergency Medicine

## 2022-03-18 ENCOUNTER — Emergency Department: Payer: Medicaid Other

## 2022-03-18 DIAGNOSIS — J069 Acute upper respiratory infection, unspecified: Secondary | ICD-10-CM | POA: Diagnosis not present

## 2022-03-18 DIAGNOSIS — I272 Pulmonary hypertension, unspecified: Secondary | ICD-10-CM | POA: Diagnosis not present

## 2022-03-18 DIAGNOSIS — R0602 Shortness of breath: Secondary | ICD-10-CM | POA: Diagnosis present

## 2022-03-18 DIAGNOSIS — R791 Abnormal coagulation profile: Secondary | ICD-10-CM | POA: Diagnosis not present

## 2022-03-18 DIAGNOSIS — Z20822 Contact with and (suspected) exposure to covid-19: Secondary | ICD-10-CM | POA: Diagnosis not present

## 2022-03-18 DIAGNOSIS — J9611 Chronic respiratory failure with hypoxia: Secondary | ICD-10-CM | POA: Diagnosis not present

## 2022-03-18 LAB — CBC WITH DIFFERENTIAL/PLATELET
Abs Immature Granulocytes: 0.04 10*3/uL (ref 0.00–0.07)
Basophils Absolute: 0.1 10*3/uL (ref 0.0–0.1)
Basophils Relative: 1 %
Eosinophils Absolute: 0.1 10*3/uL (ref 0.0–0.5)
Eosinophils Relative: 1 %
HCT: 42.7 % (ref 36.0–46.0)
Hemoglobin: 14.6 g/dL (ref 12.0–15.0)
Immature Granulocytes: 0 %
Lymphocytes Relative: 29 %
Lymphs Abs: 4.2 10*3/uL — ABNORMAL HIGH (ref 0.7–4.0)
MCH: 27.8 pg (ref 26.0–34.0)
MCHC: 34.2 g/dL (ref 30.0–36.0)
MCV: 81.3 fL (ref 80.0–100.0)
Monocytes Absolute: 1 10*3/uL (ref 0.1–1.0)
Monocytes Relative: 7 %
Neutro Abs: 9 10*3/uL — ABNORMAL HIGH (ref 1.7–7.7)
Neutrophils Relative %: 62 %
Platelets: 414 10*3/uL — ABNORMAL HIGH (ref 150–400)
RBC: 5.25 MIL/uL — ABNORMAL HIGH (ref 3.87–5.11)
RDW: 17.7 % — ABNORMAL HIGH (ref 11.5–15.5)
WBC: 14.5 10*3/uL — ABNORMAL HIGH (ref 4.0–10.5)
nRBC: 0.7 % — ABNORMAL HIGH (ref 0.0–0.2)

## 2022-03-18 LAB — COMPREHENSIVE METABOLIC PANEL
ALT: 13 U/L (ref 0–44)
AST: 24 U/L (ref 15–41)
Albumin: 3.9 g/dL (ref 3.5–5.0)
Alkaline Phosphatase: 78 U/L (ref 38–126)
Anion gap: 8 (ref 5–15)
BUN: 10 mg/dL (ref 6–20)
CO2: 23 mmol/L (ref 22–32)
Calcium: 8.8 mg/dL — ABNORMAL LOW (ref 8.9–10.3)
Chloride: 104 mmol/L (ref 98–111)
Creatinine, Ser: 0.84 mg/dL (ref 0.44–1.00)
GFR, Estimated: >60 mL/min (ref >60)
Glucose, Bld: 104 mg/dL — ABNORMAL HIGH (ref 70–99)
Potassium: 3.4 mmol/L — ABNORMAL LOW (ref 3.5–5.1)
Sodium: 135 mmol/L (ref 135–145)
Total Bilirubin: 2.4 mg/dL — ABNORMAL HIGH (ref 0.3–1.2)
Total Protein: 8.1 g/dL (ref 6.5–8.1)

## 2022-03-18 LAB — RESP PANEL BY RT-PCR (FLU A&B, COVID) ARPGX2
Influenza A by PCR: NEGATIVE
Influenza B by PCR: NEGATIVE
SARS Coronavirus 2 by RT PCR: NEGATIVE

## 2022-03-18 LAB — APTT: aPTT: 43 seconds — ABNORMAL HIGH (ref 24–36)

## 2022-03-18 LAB — BRAIN NATRIURETIC PEPTIDE: B Natriuretic Peptide: 445 pg/mL — ABNORMAL HIGH (ref 0.0–100.0)

## 2022-03-18 LAB — PROTIME-INR
INR: 1.7 — ABNORMAL HIGH (ref 0.8–1.2)
Prothrombin Time: 19.6 seconds — ABNORMAL HIGH (ref 11.4–15.2)

## 2022-03-18 LAB — PROCALCITONIN: Procalcitonin: <0.1 ng/mL

## 2022-03-18 LAB — LACTIC ACID, PLASMA: Lactic Acid, Venous: 1.3 mmol/L (ref 0.5–1.9)

## 2022-03-18 LAB — TROPONIN I (HIGH SENSITIVITY): Troponin I (High Sensitivity): 19 ng/L — ABNORMAL HIGH (ref <18)

## 2022-03-18 MED ORDER — ACETAMINOPHEN 500 MG PO TABS
1000.0000 mg | ORAL_TABLET | Freq: Once | ORAL | Status: AC
  Administered 2022-03-18: 1000 mg via ORAL
  Filled 2022-03-18: qty 2

## 2022-03-18 NOTE — ED Provider Notes (Signed)
[facility] Provider Note    Event Date/Time   First MD Initiated Contact with Patient 03/18/22 [phone]     (approximate)   History   Chief Complaint: Shortness of Breath   HPI  Yesenia Carrillo is a 26 y.o. female with a past history of hypertension, morbid obesity, pulmonary hypertension on 8 L nasal cannula at all times at home who comes the ED complaining of increased shortness of breath for the last 2 days.  She reports that recently her child was sick with a viral illness, and then she started getting sick with runny nose and congestion and feels like her breathing is worse.  She went to the [facility] ED yesterday but left without being seen.  She denies chest pain or fever.  No significant cough.  Has been compliant with her medications.     Physical Exam   Triage Vital Signs: ED Triage Vitals  Enc Vitals Group     BP 03/18/22 0356 (!) 148/126     Pulse Rate 03/18/22 0356 (!) 121     Resp 03/18/22 0356 15     Temp 03/18/22 0356 98.7 F (37.1 C)     Temp Source 03/18/22 0356 Oral     SpO2 03/18/22 0356 92 %     Weight 03/18/22 0351 238 lb (108 kg)     Height 03/18/22 0351 '5\' 2"'$  (1.575 m)     Head Circumference --      Peak Flow --      Pain Score 03/18/22 0616 5     Pain Loc --      Pain Edu? --      Excl. in [place]? --     Most recent vital signs: Vitals:   03/18/22 0526 03/18/22 0530  BP:  112/82  Pulse:  (!) 112  Resp:  (!) 33  Temp:    SpO2: 96%     General: Awake, no distress.  CV:  Good peripheral perfusion.  Tachycardia heart rate 110.  Normal distal pulses Resp:  Normal effort.  Clear to auscultation bilaterally.  No wheezing or crackles.  Symmetric breath sounds Abd:  No distention.  Soft nontender Other:  No lower extremity edema or calf tenderness.  There is copious rhinorrhea.  Moist oral mucosa.   ED Results / Procedures / Treatments   Labs (all labs ordered are listed, but only abnormal results are displayed) Labs Reviewed   COMPREHENSIVE METABOLIC PANEL - Abnormal; Notable for the following components:      Result Value   Potassium 3.4 (*)    Glucose, Bld 104 (*)    Calcium 8.8 (*)    Total Bilirubin 2.4 (*)    All other components within normal limits  CBC WITH DIFFERENTIAL/PLATELET - Abnormal; Notable for the following components:   WBC 14.5 (*)    RBC 5.25 (*)    RDW 17.7 (*)    Platelets 414 (*)    nRBC 0.7 (*)    Neutro Abs 9.0 (*)    Lymphs Abs 4.2 (*)    All other components within normal limits  PROTIME-INR - Abnormal; Notable for the following components:   Prothrombin Time 19.6 (*)    INR 1.7 (*)    All other components within normal limits  APTT - Abnormal; Notable for the following components:   aPTT 43 (*)    All other components within normal limits  BRAIN NATRIURETIC PEPTIDE - Abnormal; Notable for the following components:   B  Natriuretic Peptide 445.0 (*)    All other components within normal limits  TROPONIN I (HIGH SENSITIVITY) - Abnormal; Notable for the following components:   Troponin I (High Sensitivity) 19 (*)    All other components within normal limits  CULTURE, BLOOD (ROUTINE X 2)  CULTURE, BLOOD (ROUTINE X 2)  RESP PANEL BY RT-PCR (FLU A&B, COVID) ARPGX2  LACTIC ACID, PLASMA  PROCALCITONIN  LACTIC ACID, PLASMA  URINALYSIS, COMPLETE (UACMP) WITH MICROSCOPIC  POC URINE PREG, ED  TROPONIN I (HIGH SENSITIVITY)     EKG Interpreted by me Sinus tachycardia rate 115.  Right axis, normal intervals.  Normal QRS ST segments and T waves.   RADIOLOGY Chest x-ray interpreted by me, appears normal.  No pulmonary edema pleural effusion or pneumothorax.  Radiology report reviewed.   PROCEDURES:  Procedures   MEDICATIONS ORDERED IN ED: Medications  acetaminophen (TYLENOL) tablet 1,000 mg (1,000 mg Oral Given 03/18/22 0516)     IMPRESSION / MDM / ASSESSMENT AND PLAN / ED COURSE  I reviewed the triage vital signs and the nursing notes.                               Differential diagnosis includes, but is not limited to, pneumonia, pleural effusion, pulmonary edema, pneumothorax, electrolyte interbody, CHF, AKI, viral illness.  Doubt ACS PE dissection or decompensated pulmonary hypertension  Patient's presentation is most consistent with acute presentation with potential threat to life or bodily function.  Patient presents with shortness of breath in the setting of chronic respiratory failure, oxygen saturation is 92% on her baseline supplemental O2.  Exam is reassuring, vital signs otherwise unremarkable.  She does have tachycardia, which appears to be chronic based on review of records.  Presentation is consistent with a viral illness on top of her chronic respiratory failure.  Work-up is reassuring, chest x-ray unremarkable, COVID and flu negative.  Stable for discharge.       FINAL CLINICAL IMPRESSION(S) / ED DIAGNOSES   Final diagnoses:  Viral URI  Pulmonary hypertension ([place])  Chronic respiratory failure with hypoxia ([place])     Rx / DC Orders   ED Discharge Orders     None        Note:  This document was prepared using Dragon voice recognition software and may include unintentional dictation errors.   Carrie Mew, MD 03/18/22 [phone]

## 2022-03-18 NOTE — ED Notes (Signed)
Pt asleep upon arrival. Awoken by verbal arousal and responds accordingly. Pt A&Ox4. Pt vitals stable. Pt resting in bed.

## 2022-03-18 NOTE — ED Triage Notes (Signed)
Pt reports SOB worse than normal. Pt currently working with [facility] to determine what type of lung disease she has, per pt report. Pt at [facility] yesterday and left AMA. Pt chronically on 8L [place] at home. EMS reports pt in 50's Spo2 on RA. Pt presents alert and oriented. Breathing unlabored at this time and pt speaking in full sentences. Pt does report hx of CHF and pulmonary HTN. Pt reports chest pressure.

## 2022-03-18 NOTE — ED Notes (Signed)
0500:  Pt given ginger ale per request. Pt also provided with fan. Pt resting in bed talking on phone and with RN in full sentences. Breathing unlabored with symmetric chest rise and fall. Bed low and locked with side rails raised x2. Call bell in reach and cardiac monitor in place

## 2022-03-18 NOTE — ED Notes (Signed)
Vital signs stable. Pt LDA removed from Left AC. Waiting for pt family member coming to pick pt up for discharge.

## 2022-03-23 LAB — CULTURE, BLOOD (ROUTINE X 2)
Culture: NO GROWTH
Culture: NO GROWTH
Special Requests: ADEQUATE
Special Requests: ADEQUATE

## 2022-05-23 ENCOUNTER — Inpatient Hospital Stay
Admission: EM | Admit: 2022-05-23 | Discharge: 2022-05-27 | DRG: 871 | Disposition: A | Discharge status: Home / Self Care | Payer: Medicaid Other | Attending: Family Medicine | Admitting: Family Medicine

## 2022-05-23 ENCOUNTER — Other Ambulatory Visit: Payer: Self-pay

## 2022-05-23 ENCOUNTER — Emergency Department: Payer: Medicaid Other

## 2022-05-23 DIAGNOSIS — J101 Influenza due to other identified influenza virus with other respiratory manifestations: Secondary | ICD-10-CM | POA: Diagnosis present

## 2022-05-23 DIAGNOSIS — I10 Essential (primary) hypertension: Secondary | ICD-10-CM | POA: Diagnosis present

## 2022-05-23 DIAGNOSIS — Z86711 Personal history of pulmonary embolism: Secondary | ICD-10-CM | POA: Diagnosis not present

## 2022-05-23 DIAGNOSIS — I2699 Other pulmonary embolism without acute cor pulmonale: Secondary | ICD-10-CM | POA: Diagnosis present

## 2022-05-23 DIAGNOSIS — Z87891 Personal history of nicotine dependence: Secondary | ICD-10-CM | POA: Diagnosis not present

## 2022-05-23 DIAGNOSIS — E876 Hypokalemia: Secondary | ICD-10-CM | POA: Diagnosis present

## 2022-05-23 DIAGNOSIS — Z86718 Personal history of other venous thrombosis and embolism: Secondary | ICD-10-CM

## 2022-05-23 DIAGNOSIS — Z8616 Personal history of COVID-19: Secondary | ICD-10-CM | POA: Diagnosis not present

## 2022-05-23 DIAGNOSIS — I110 Hypertensive heart disease with heart failure: Secondary | ICD-10-CM | POA: Diagnosis present

## 2022-05-23 DIAGNOSIS — Z7901 Long term (current) use of anticoagulants: Secondary | ICD-10-CM

## 2022-05-23 DIAGNOSIS — Z9981 Dependence on supplemental oxygen: Secondary | ICD-10-CM | POA: Diagnosis not present

## 2022-05-23 DIAGNOSIS — Z6841 Body Mass Index (BMI) 40.0 and over, adult: Secondary | ICD-10-CM | POA: Diagnosis present

## 2022-05-23 DIAGNOSIS — I5032 Chronic diastolic (congestive) heart failure: Secondary | ICD-10-CM | POA: Diagnosis present

## 2022-05-23 DIAGNOSIS — R0609 Other forms of dyspnea: Secondary | ICD-10-CM | POA: Diagnosis not present

## 2022-05-23 DIAGNOSIS — I272 Pulmonary hypertension, unspecified: Secondary | ICD-10-CM | POA: Diagnosis present

## 2022-05-23 DIAGNOSIS — J9621 Acute and chronic respiratory failure with hypoxia: Secondary | ICD-10-CM | POA: Diagnosis present

## 2022-05-23 DIAGNOSIS — I5A Non-ischemic myocardial injury (non-traumatic): Secondary | ICD-10-CM | POA: Diagnosis not present

## 2022-05-23 DIAGNOSIS — A419 Sepsis, unspecified organism: Secondary | ICD-10-CM | POA: Diagnosis present

## 2022-05-23 DIAGNOSIS — Z7984 Long term (current) use of oral hypoglycemic drugs: Secondary | ICD-10-CM

## 2022-05-23 DIAGNOSIS — R6520 Severe sepsis without septic shock: Secondary | ICD-10-CM | POA: Diagnosis present

## 2022-05-23 DIAGNOSIS — I2722 Pulmonary hypertension due to left heart disease: Secondary | ICD-10-CM | POA: Diagnosis present

## 2022-05-23 DIAGNOSIS — A4189 Other specified sepsis: Secondary | ICD-10-CM | POA: Diagnosis present

## 2022-05-23 DIAGNOSIS — K219 Gastro-esophageal reflux disease without esophagitis: Secondary | ICD-10-CM | POA: Diagnosis present

## 2022-05-23 DIAGNOSIS — Z1152 Encounter for screening for COVID-19: Secondary | ICD-10-CM

## 2022-05-23 DIAGNOSIS — J849 Interstitial pulmonary disease, unspecified: Secondary | ICD-10-CM | POA: Diagnosis present

## 2022-05-23 DIAGNOSIS — Z79899 Other long term (current) drug therapy: Secondary | ICD-10-CM | POA: Diagnosis not present

## 2022-05-23 DIAGNOSIS — R7989 Other specified abnormal findings of blood chemistry: Secondary | ICD-10-CM | POA: Diagnosis present

## 2022-05-23 LAB — BLOOD GAS, VENOUS
Acid-Base Excess: 1.5 mmol/L (ref 0.0–2.0)
Bicarbonate: 25.9 mmol/L (ref 20.0–28.0)
O2 Saturation: 87.5 %
Patient temperature: 37
pCO2, Ven: 39 mmHg — ABNORMAL LOW (ref 44–60)
pH, Ven: 7.43 (ref 7.25–7.43)
pO2, Ven: 64 mmHg — ABNORMAL HIGH (ref 32–45)

## 2022-05-23 LAB — COMPREHENSIVE METABOLIC PANEL
ALT: 14 U/L (ref 0–44)
AST: 27 U/L (ref 15–41)
Albumin: 4 g/dL (ref 3.5–5.0)
Alkaline Phosphatase: 96 U/L (ref 38–126)
Anion gap: 10 (ref 5–15)
BUN: 15 mg/dL (ref 6–20)
CO2: 23 mmol/L (ref 22–32)
Calcium: 9.3 mg/dL (ref 8.9–10.3)
Chloride: 105 mmol/L (ref 98–111)
Creatinine, Ser: 1.07 mg/dL — ABNORMAL HIGH (ref 0.44–1.00)
GFR, Estimated: >60 mL/min (ref >60)
Glucose, Bld: 88 mg/dL (ref 70–99)
Potassium: 3.1 mmol/L — ABNORMAL LOW (ref 3.5–5.1)
Sodium: 138 mmol/L (ref 135–145)
Total Bilirubin: 2.6 mg/dL — ABNORMAL HIGH (ref 0.3–1.2)
Total Protein: 8.7 g/dL — ABNORMAL HIGH (ref 6.5–8.1)

## 2022-05-23 LAB — URINALYSIS, ROUTINE W REFLEX MICROSCOPIC
Bilirubin Urine: NEGATIVE
Glucose, UA: >=500 mg/dL — AB
Ketones, ur: NEGATIVE mg/dL
Leukocytes,Ua: NEGATIVE
Nitrite: NEGATIVE
Protein, ur: NEGATIVE mg/dL
Specific Gravity, Urine: 1.017 (ref 1.005–1.030)
pH: 5 (ref 5.0–8.0)

## 2022-05-23 LAB — CBC WITH DIFFERENTIAL/PLATELET
Abs Immature Granulocytes: 0.03 10*3/uL (ref 0.00–0.07)
Basophils Absolute: 0 10*3/uL (ref 0.0–0.1)
Basophils Relative: 0 %
Eosinophils Absolute: 0.1 10*3/uL (ref 0.0–0.5)
Eosinophils Relative: 1 %
HCT: 47.1 % — ABNORMAL HIGH (ref 36.0–46.0)
Hemoglobin: 16.1 g/dL — ABNORMAL HIGH (ref 12.0–15.0)
Immature Granulocytes: 0 %
Lymphocytes Relative: 8 %
Lymphs Abs: 0.8 10*3/uL (ref 0.7–4.0)
MCH: 27.3 pg (ref 26.0–34.0)
MCHC: 34.2 g/dL (ref 30.0–36.0)
MCV: 79.8 fL — ABNORMAL LOW (ref 80.0–100.0)
Monocytes Absolute: 1.2 10*3/uL — ABNORMAL HIGH (ref 0.1–1.0)
Monocytes Relative: 12 %
Neutro Abs: 7.2 10*3/uL (ref 1.7–7.7)
Neutrophils Relative %: 79 %
Platelets: 338 10*3/uL (ref 150–400)
RBC: 5.9 MIL/uL — ABNORMAL HIGH (ref 3.87–5.11)
RDW: 18.8 % — ABNORMAL HIGH (ref 11.5–15.5)
WBC: 9.3 10*3/uL (ref 4.0–10.5)
nRBC: 0.9 % — ABNORMAL HIGH (ref 0.0–0.2)

## 2022-05-23 LAB — TROPONIN I (HIGH SENSITIVITY)
Troponin I (High Sensitivity): 20 ng/L — ABNORMAL HIGH (ref <18)
Troponin I (High Sensitivity): 23 ng/L — ABNORMAL HIGH (ref <18)

## 2022-05-23 LAB — LIPID PANEL
Cholesterol: 140 mg/dL (ref 0–200)
HDL: 25 mg/dL — ABNORMAL LOW (ref >40)
LDL Cholesterol: 101 mg/dL — ABNORMAL HIGH (ref 0–99)
Total CHOL/HDL Ratio: 5.6 RATIO
Triglycerides: 71 mg/dL (ref <150)
VLDL: 14 mg/dL (ref 0–40)

## 2022-05-23 LAB — LACTIC ACID, PLASMA
Lactic Acid, Venous: 2.7 mmol/L (ref 0.5–1.9)
Lactic Acid, Venous: 1.8 mmol/L (ref 0.5–1.9)

## 2022-05-23 LAB — RESP PANEL BY RT-PCR (RSV, FLU A&B, COVID)  RVPGX2
Influenza A by PCR: POSITIVE — AB
Influenza B by PCR: NEGATIVE
Resp Syncytial Virus by PCR: NEGATIVE
SARS Coronavirus 2 by RT PCR: NEGATIVE

## 2022-05-23 LAB — MAGNESIUM: Magnesium: 2.2 mg/dL (ref 1.7–2.4)

## 2022-05-23 LAB — PROCALCITONIN: Procalcitonin: <0.1 ng/mL

## 2022-05-23 LAB — BRAIN NATRIURETIC PEPTIDE: B Natriuretic Peptide: 586.2 pg/mL — ABNORMAL HIGH (ref 0.0–100.0)

## 2022-05-23 LAB — PREGNANCY, URINE: Preg Test, Ur: NEGATIVE

## 2022-05-23 MED ORDER — APIXABAN 5 MG PO TABS
5.0000 mg | ORAL_TABLET | Freq: Two times a day (BID) | ORAL | Status: DC
  Administered 2022-05-23 – 2022-05-27 (×9): 5 mg via ORAL
  Filled 2022-05-23 (×9): qty 1

## 2022-05-23 MED ORDER — OSELTAMIVIR PHOSPHATE 75 MG PO CAPS: 75.0000 mg | ORAL_CAPSULE | Freq: Two times a day (BID) | ORAL | Status: DC

## 2022-05-23 MED ORDER — METHYLPREDNISOLONE SODIUM SUCC 125 MG IJ SOLR
80.0000 mg | Freq: Two times a day (BID) | INTRAMUSCULAR | Status: DC
  Administered 2022-05-23 – 2022-05-25 (×5): 80 mg via INTRAVENOUS
  Filled 2022-05-23 (×5): qty 2

## 2022-05-23 MED ORDER — IPRATROPIUM-ALBUTEROL 0.5-2.5 (3) MG/3ML IN SOLN
3.0000 mL | RESPIRATORY_TRACT | Status: DC
  Administered 2022-05-23 – 2022-05-24 (×8): 3 mL via RESPIRATORY_TRACT
  Filled 2022-05-23 (×8): qty 3

## 2022-05-23 MED ORDER — TREPROSTINIL 0.6 MG/ML IN SOLN
18.0000 ug | RESPIRATORY_TRACT | Status: DC
  Administered 2022-05-26 – 2022-05-27 (×5): 18 ug via RESPIRATORY_TRACT
  Filled 2022-05-23: qty 2.9

## 2022-05-23 MED ORDER — FAMOTIDINE 20 MG PO TABS
20.0000 mg | ORAL_TABLET | Freq: Two times a day (BID) | ORAL | Status: DC
  Administered 2022-05-23 – 2022-05-27 (×9): 20 mg via ORAL
  Filled 2022-05-23 (×9): qty 1

## 2022-05-23 MED ORDER — SODIUM CHLORIDE 0.9 % IV SOLN
2.0000 g | Freq: Once | INTRAVENOUS | Status: AC
  Administered 2022-05-23: 2 g via INTRAVENOUS
  Filled 2022-05-23: qty 20

## 2022-05-23 MED ORDER — ALBUTEROL SULFATE (2.5 MG/3ML) 0.083% IN NEBU
2.5000 mg | INHALATION_SOLUTION | Freq: Once | RESPIRATORY_TRACT | Status: AC
  Administered 2022-05-23: 2.5 mg via RESPIRATORY_TRACT
  Filled 2022-05-23: qty 3

## 2022-05-23 MED ORDER — ENOXAPARIN SODIUM 60 MG/0.6ML IJ SOSY: 0.5000 mg/kg | PREFILLED_SYRINGE | INTRAMUSCULAR | Status: DC

## 2022-05-23 MED ORDER — SODIUM CHLORIDE 0.9 % IV BOLUS
500.0000 mL | Freq: Once | INTRAVENOUS | Status: AC
  Administered 2022-05-23: 500 mL via INTRAVENOUS

## 2022-05-23 MED ORDER — SPIRONOLACTONE 25 MG PO TABS
25.0000 mg | ORAL_TABLET | Freq: Every day | ORAL | Status: DC
  Administered 2022-05-23: 25 mg via ORAL
  Filled 2022-05-23: qty 1

## 2022-05-23 MED ORDER — HYDRALAZINE HCL 20 MG/ML IJ SOLN: 5.0000 mg | INTRAMUSCULAR | Status: DC | PRN

## 2022-05-23 MED ORDER — ACETAMINOPHEN 325 MG PO TABS
650.0000 mg | ORAL_TABLET | Freq: Once | ORAL | Status: AC
  Administered 2022-05-23: 650 mg via ORAL
  Filled 2022-05-23: qty 2

## 2022-05-23 MED ORDER — SODIUM CHLORIDE 0.9 % IV SOLN
500.0000 mg | Freq: Once | INTRAVENOUS | Status: AC
  Administered 2022-05-23: 500 mg via INTRAVENOUS
  Filled 2022-05-23: qty 5

## 2022-05-23 MED ORDER — DM-GUAIFENESIN ER 30-600 MG PO TB12: 1.0000 | ORAL_TABLET | Freq: Two times a day (BID) | ORAL | Status: DC | PRN

## 2022-05-23 MED ORDER — OSELTAMIVIR PHOSPHATE 75 MG PO CAPS: 75.0000 mg | ORAL_CAPSULE | Freq: Once | ORAL | Status: DC

## 2022-05-23 MED ORDER — ONDANSETRON HCL 4 MG/2ML IJ SOLN
4.0000 mg | Freq: Three times a day (TID) | INTRAMUSCULAR | Status: DC | PRN
  Administered 2022-05-25: 4 mg via INTRAVENOUS
  Filled 2022-05-23 (×2): qty 2

## 2022-05-23 MED ORDER — TORSEMIDE 20 MG PO TABS: 40.0000 mg | ORAL_TABLET | Freq: Two times a day (BID) | ORAL | Status: DC

## 2022-05-23 MED ORDER — POTASSIUM CHLORIDE CRYS ER 20 MEQ PO TBCR
60.0000 meq | EXTENDED_RELEASE_TABLET | Freq: Once | ORAL | Status: AC
  Administered 2022-05-23: 60 meq via ORAL
  Filled 2022-05-23: qty 3

## 2022-05-23 MED ORDER — OSELTAMIVIR PHOSPHATE 75 MG PO CAPS
75.0000 mg | ORAL_CAPSULE | Freq: Two times a day (BID) | ORAL | Status: DC
  Administered 2022-05-23 – 2022-05-27 (×9): 75 mg via ORAL
  Filled 2022-05-23 (×9): qty 1

## 2022-05-23 MED ORDER — SODIUM CHLORIDE 0.9 % IV BOLUS (SEPSIS)
1000.0000 mL | Freq: Once | INTRAVENOUS | Status: AC
  Administered 2022-05-23: 1000 mL via INTRAVENOUS

## 2022-05-23 MED ORDER — ALBUTEROL SULFATE (2.5 MG/3ML) 0.083% IN NEBU: 2.5000 mg | INHALATION_SOLUTION | RESPIRATORY_TRACT | Status: DC | PRN

## 2022-05-23 MED ORDER — ACETAMINOPHEN 325 MG PO TABS
650.0000 mg | ORAL_TABLET | Freq: Four times a day (QID) | ORAL | Status: DC | PRN
  Administered 2022-05-23 – 2022-05-26 (×6): 650 mg via ORAL
  Filled 2022-05-23 (×7): qty 2

## 2022-05-23 MED ORDER — LEVOFLOXACIN IN D5W 750 MG/150ML IV SOLN: 750.0000 mg | Freq: Once | INTRAVENOUS | Status: DC

## 2022-05-23 MED ORDER — SILDENAFIL CITRATE 20 MG PO TABS
20.0000 mg | ORAL_TABLET | Freq: Three times a day (TID) | ORAL | Status: DC
  Administered 2022-05-23 – 2022-05-27 (×12): 20 mg via ORAL
  Filled 2022-05-23 (×15): qty 1

## 2022-05-23 MED ORDER — METOPROLOL TARTRATE 5 MG/5ML IV SOLN
2.5000 mg | INTRAVENOUS | Status: DC | PRN
  Filled 2022-05-23: qty 5

## 2022-05-23 NOTE — Consult Note (Signed)
PHARMACY -  BRIEF ANTIBIOTIC NOTE   Pharmacy has received consult(s) for levofloxacin from an ED provider.  The patient's profile has been reviewed for ht/wt/allergies/indication/available labs.    After review of PCN allergy and documented tolerated of cephalosporins  One time order(s) placed for  Ceftriaxone 2 gram Azithromycin 500 mg   Further antibiotics/pharmacy consults should be ordered by admitting physician if indicated.                       Thank you, Dorothe Pea, PharmD, BCPS Clinical Pharmacist   05/23/2022  10:51 AM

## 2022-05-23 NOTE — ED Triage Notes (Signed)
Pt has chronic lung disease and developed SOB. EMS placed her on CPAP.

## 2022-05-23 NOTE — ED Provider Notes (Signed)
[facility] Provider Note    Event Date/Time   First MD Initiated Contact with Patient 05/23/22 [phone]     (approximate)   History   Shortness of Breath (SOB and weakness)   HPI  Yesenia Carrillo is a 26 y.o. female with history of hypertension, morbid obesity, pulmonary hypertension, chronic respiratory failure on 8 L nasal cannula who presents with increased shortness of breath since yesterday associated with cough and fever.  The patient had some chest pain earlier which is now resolved.  She denies any vomiting or diarrhea.  Per EMS the patient was hypoxic to the 80s on her normal 8 L so she was placed on CPAP and states she is feeling somewhat better.  I reviewed the past medical records.  The patient was most recently seen in the ED on 10/14 for increased shortness of breath but was not hypoxic at that time.  She was most recently admitted in 2021 at which time she was in the ICU for respiratory failure and was intubated.  She previously had septic shock with severe acidosis and multifocal pneumonia in 2019 requiring ECMO.   Physical Exam   Triage Vital Signs: ED Triage Vitals [05/23/22 0946]  Enc Vitals Group     BP      Pulse      Resp      Temp      Temp src      SpO2 100 %     Weight      Height      Head Circumference      Peak Flow      Pain Score      Pain Loc      Pain Edu?      Excl. in [place]?     Most recent vital signs: Vitals:   05/23/22 1300 05/23/22 1345  BP:    Pulse: (!) 122 (!) 109  Resp: (!) 24 (!) 34  Temp:    SpO2: 94% 98%     General: Alert, no acute distress. CV:  Good peripheral perfusion.  Resp:  Increased respiratory effort.  Coarse breath sounds bilaterally. Abd:  No distention.  Other:  No peripheral edema.   ED Results / Procedures / Treatments   Labs (all labs ordered are listed, but only abnormal results are displayed) Labs Reviewed  RESP PANEL BY RT-PCR (RSV, FLU A&B, COVID)  RVPGX2 - Abnormal;  Notable for the following components:      Result Value   Influenza A by PCR POSITIVE (*)    All other components within normal limits  COMPREHENSIVE METABOLIC PANEL - Abnormal; Notable for the following components:   Potassium 3.1 (*)    Creatinine, Ser 1.07 (*)    Total Protein 8.7 (*)    Total Bilirubin 2.6 (*)    All other components within normal limits  CBC WITH DIFFERENTIAL/PLATELET - Abnormal; Notable for the following components:   RBC 5.90 (*)    Hemoglobin 16.1 (*)    HCT 47.1 (*)    MCV 79.8 (*)    RDW 18.8 (*)    nRBC 0.9 (*)    Monocytes Absolute 1.2 (*)    All other components within normal limits  BRAIN NATRIURETIC PEPTIDE - Abnormal; Notable for the following components:   B Natriuretic Peptide 586.2 (*)    All other components within normal limits  URINALYSIS, ROUTINE W REFLEX MICROSCOPIC - Abnormal; Notable for the following components:   Color, Urine YELLOW (*)  APPearance CLEAR (*)    Glucose, UA >=500 (*)    Hgb urine dipstick LARGE (*)    Bacteria, UA RARE (*)    All other components within normal limits  BLOOD GAS, VENOUS - Abnormal; Notable for the following components:   pCO2, Ven 39 (*)    pO2, Ven 64 (*)    All other components within normal limits  LACTIC ACID, PLASMA - Abnormal; Notable for the following components:   Lactic Acid, Venous 2.7 (*)    All other components within normal limits  LIPID PANEL - Abnormal; Notable for the following components:   HDL 25 (*)    LDL Cholesterol 101 (*)    All other components within normal limits  TROPONIN I (HIGH SENSITIVITY) - Abnormal; Notable for the following components:   Troponin I (High Sensitivity) 20 (*)    All other components within normal limits  TROPONIN I (HIGH SENSITIVITY) - Abnormal; Notable for the following components:   Troponin I (High Sensitivity) 23 (*)    All other components within normal limits  CULTURE, BLOOD (ROUTINE X 2)  CULTURE, BLOOD (ROUTINE X 2)  LACTIC ACID, PLASMA   PREGNANCY, URINE  MAGNESIUM  PROCALCITONIN  HEMOGLOBIN A1C  HIV ANTIBODY (ROUTINE TESTING W REFLEX)     EKG  ED ECG REPORT I, Arta Silence, the attending physician, personally viewed and interpreted this ECG.  Date: 05/23/2022 EKG Time: 0947 Rate: 122 Rhythm: Sinus tachycardia QRS Axis: normal Intervals: normal ST/T Wave abnormalities: Nonspecific T wave abnormalities Narrative Interpretation: no evidence of acute ischemia    RADIOLOGY  Chest x-ray: I independently viewed and interpreted the images; there is possible atelectasis versus infiltrate in the right base  PROCEDURES:  Critical Care performed: Yes, see critical care procedure note(s)  .Critical Care  Performed by: Arta Silence, MD Authorized by: Arta Silence, MD   Critical care provider statement:    Critical care time (minutes):  30   Critical care time was exclusive of:  Separately billable procedures and treating other patients   Critical care was necessary to treat or prevent imminent or life-threatening deterioration of the following conditions:  Respiratory failure   Critical care was time spent personally by me on the following activities:  Development of treatment plan with patient or surrogate, discussions with consultants, evaluation of patient's response to treatment, examination of patient, ordering and review of laboratory studies, ordering and review of radiographic studies, ordering and performing treatments and interventions, pulse oximetry, re-evaluation of patient's condition, review of old charts and obtaining history from patient or surrogate   Care discussed with: admitting provider      MEDICATIONS ORDERED IN ED: Medications  oseltamivir (TAMIFLU) capsule 75 mg (75 mg Oral Given 05/23/22 1313)  ipratropium-albuterol (DUONEB) 0.5-2.5 (3) MG/3ML nebulizer solution 3 mL (3 mLs Nebulization Given 05/23/22 1150)  albuterol (PROVENTIL) (2.5 MG/3ML) 0.083% nebulizer  solution 2.5 mg (has no administration in time range)  dextromethorphan-guaiFENesin (MUCINEX DM) 30-600 MG per 12 hr tablet 1 tablet (has no administration in time range)  methylPREDNISolone sodium succinate (SOLU-MEDROL) 125 mg/2 mL injection 80 mg (80 mg Intravenous Given 05/23/22 1150)  ondansetron (ZOFRAN) injection 4 mg (has no administration in time range)  acetaminophen (TYLENOL) tablet 650 mg (has no administration in time range)  hydrALAZINE (APRESOLINE) injection 5 mg (has no administration in time range)  metoprolol tartrate (LOPRESSOR) injection 2.5-5 mg (has no administration in time range)  sildenafil (REVATIO) tablet 20 mg (has no administration in time range)  spironolactone (  ALDACTONE) tablet 25 mg (25 mg Oral Given 05/23/22 1428)  torsemide (DEMADEX) tablet 40 mg (has no administration in time range)  Treprostinil (TYVASO) inhalation solution 18 mcg (18 mcg Inhalation Not Given 05/23/22 1425)  famotidine (PEPCID) tablet 20 mg (20 mg Oral Given 05/23/22 1427)  apixaban (ELIQUIS) tablet 5 mg (5 mg Oral Given 05/23/22 1427)  sodium chloride 0.9 % bolus 500 mL (0 mLs Intravenous Stopped 05/23/22 1123)  albuterol (PROVENTIL) (2.5 MG/3ML) 0.083% nebulizer solution 2.5 mg (2.5 mg Nebulization Given 05/23/22 1001)  albuterol (PROVENTIL) (2.5 MG/3ML) 0.083% nebulizer solution 2.5 mg (2.5 mg Nebulization Given 05/23/22 1000)  acetaminophen (TYLENOL) tablet 650 mg (650 mg Oral Given 05/23/22 1123)  sodium chloride 0.9 % bolus 1,000 mL (1,000 mLs Intravenous New Bag/Given 05/23/22 1124)  cefTRIAXone (ROCEPHIN) 2 g in sodium chloride 0.9 % 100 mL IVPB (0 g Intravenous Stopped 05/23/22 1208)  azithromycin (ZITHROMAX) 500 mg in sodium chloride 0.9 % 250 mL IVPB (0 mg Intravenous Stopped 05/23/22 1240)  potassium chloride SA (KLOR-CON M) CR tablet 60 mEq (60 mEq Oral Given 05/23/22 1150)     IMPRESSION / MDM / ASSESSMENT AND PLAN / ED COURSE  I reviewed the triage vital signs and the  nursing notes.  26 year old female with PMH as noted above including chronic respiratory failure on 8 L nasal cannula presents with increased shortness of breath and hypoxia requiring CPAP by EMS.  She is also febrile and tachycardic but alert with no acute distress.  Differential diagnosis includes, but is not limited to, pneumonia, COVID-19, RSV, other viral syndrome, less likely pulmonary edema or cardiac etiology.  I do not suspect PE given the lack of chest pain in the presence of fever.  We have placed the patient on BiPAP.  We will give a trial of bronchodilators, fluid bolus, obtain lab workup and chest x-ray, and reassess.  Patient's presentation is most consistent with acute presentation with potential threat to life or bodily function.  The patient is on the cardiac monitor to evaluate for evidence of arrhythmia and/or significant heart rate changes.  ----------------------------------------- 11:41 AM on 05/23/2022 -----------------------------------------  Respiratory panel was positive for influenza.  Chest x-ray shows a possible infiltrate so I ordered empiric antibiotics for CAP.  BNP is minimally elevated.  Do not suspect CHF.  There is no leukocytosis on CBC.  The patient is significantly improved and I was able to switch her from BiPAP to high flow nasal cannula at 8 L/min.  I consulted Dr. Blaine Hamper from the hospitalist service; based on discussion he agrees to admit the patient.   FINAL CLINICAL IMPRESSION(S) / ED DIAGNOSES   Final diagnoses:  Influenza A  Acute on chronic respiratory failure with hypoxia ([place])     Rx / DC Orders   ED Discharge Orders     None        Note:  This document was prepared using Dragon voice recognition software and may include unintentional dictation errors.    Arta Silence, MD 05/23/22 1500

## 2022-05-23 NOTE — ED Notes (Signed)
MD made aware of pts low BP.

## 2022-05-23 NOTE — H&P (Addendum)
History and Physical    Yesenia Carrillo KZS:[id number] DOB: [birth year] DOA: 05/23/2022  Referring MD/NP/PA:   PCP: [facility] [place]   Patient coming from:  The patient is coming from home.  At baseline, pt is independent for most of ADL.        Chief Complaint: SOB  HPI: Yesenia Carrillo is a 26 y.o. female with medical history significant of ILD on 8 L oxygen at home, dCHF, PE, pulmonary hypertension, history of intubation and ECMO, hypertension, GERD, morbid obesity with BMI 44.26, PE  on Eliquis, who presents with shortness breath  Patient states that she started having shortness breath since yesterday, which has been progressively worsening.  Patient has dry cough.  Initially patient had fever and chills, which has resolved currently.  Patient also reports that she had some chest pain earlier, which is located in her front chest, pressure-like, 4 out of 10 in severity, currently chest pain-free, not pleuritic, not aggravated by deep breath, nonradiating.  Patient denies nausea, vomiting, diarrhea or abdominal pain.  No symptoms of UTI.  Patient states that she still taking Eliquis consistently.  Patient was found to have severe respiratory distress, oxygen desaturation to 80s on home level 8 L oxygen, cannot speak in full sentence, using accessory muscle for breathing, initially CPAP started, then changed to BiPAP in ED.  Data reviewed independently and ED Course: pt was found to have positive flu PCR, negative COVID, troponin level 20, lactic acid 2.7, BNP 586, WBC 9.3, urinalysis (yellow appearance, negative leukocyte, rare bacteria, WBC 6-10), GFR> 60, potassium 3.1, temperature 99.5, blood pressure 113/79, heart rate 121, RR 24.  Chest x-ray showed cardiomegaly and right basilar hazy opacity.  Patient is admitted to stepdown as inpatient.  EKG: I have personally reviewed.  Sinus rhythm, QTc 435, bilateral atrial enlargement, right axis deviation.  Right bundle blockade,  early R wave progression   Review of Systems:   General: Has subjective fevers, chills, no body weight gain, has fatigue HEENT: no blurry vision, hearing changes or sore throat Respiratory: has dyspnea, coughing, no wheezing CV: had chest pain, no palpitations GI: no nausea, vomiting, abdominal pain, diarrhea, constipation GU: no dysuria, burning on urination, increased urinary frequency, hematuria  Ext: has trace leg edema Neuro: no unilateral weakness, numbness, or tingling, no vision change or hearing loss Skin: no rash, no skin tear. MSK: No muscle spasm, no deformity, no limitation of range of movement in spin Heme: No easy bruising.  Travel history: No recent long distant travel.   Allergy:  Allergies  Allergen Reactions   Ampicillin Rash and Other (See Comments)    Patient and family cannot remember the specifics of the reaction.   Penicillins Hives    Past Medical History:  Diagnosis Date   COVID-19 04/2019   patient reports diagnosis in Nov 2020   Hypertension    Interstitial lung disease ([place])    Morbid obesity with BMI of 40.0-44.9, adult ([place])    Pre-eclampsia     Past Surgical History:  Procedure Laterality Date   TONSILLECTOMY     WISDOM TOOTH EXTRACTION      Social History:  reports that she quit smoking about 6 years ago. Her smoking use included cigarettes. She has never used smokeless tobacco. She reports that she does not currently use alcohol. She reports that she does not use drugs.  Family History:  Family History  Problem Relation Age of Onset   Healthy Mother    Healthy  Father      Prior to Admission medications   Medication Sig Start Date End Date Taking? Authorizing Provider  albuterol (VENTOLIN HFA) 108 (90 Base) MCG/ACT inhaler Inhale into the lungs. 07/25/21   [provider]  amLODipine (NORVASC) 10 MG tablet Take 1 tablet by mouth daily. 10/19/20   [provider]  azithromycin (ZITHROMAX) 250 MG tablet Take 250 mg  by mouth daily. 07/26/21   [provider]  empagliflozin (JARDIANCE) 10 MG TABS tablet Take 1 tablet by mouth daily. 10/19/20   [provider]  famotidine (PEPCID) 20 MG tablet Take by mouth. 08/29/21 08/29/22  [provider]  furosemide (LASIX) 40 MG tablet Take 1 tablet by mouth daily. 07/12/21   [provider]    Physical Exam: Vitals:   05/23/22 1020 05/23/22 1030 05/23/22 1045 05/23/22 1100  BP:  108/83 110/79 113/79  Pulse:  (!) 121 (!) 120 (!) 111  Resp:   (!) 24 18  Temp: 99.5 F (37.5 C)     TempSrc: Axillary     SpO2:  97% 94% 93%  Weight:      Height:       General: In no acute respiratory distress HEENT:       Eyes: PERRL, EOMI, no scleral icterus.       ENT: No discharge from the ears and nose, no pharynx injection, no tonsillar enlargement.        Neck: Difficult to assess JVD, no bruit, no mass felt. Heme: No neck lymph node enlargement. Cardiac: S1/S2, RRR, No murmurs, No gallops or rubs. Respiratory: Decreased air movement bilaterally GI: Soft, nondistended, nontender, no rebound pain, no organomegaly, BS present. GU: No hematuria Ext: has trace leg edema bilaterally. 1+DP/PT pulse bilaterally. Musculoskeletal: No joint deformities, No joint redness or warmth, no limitation of ROM in spin. Skin: No rashes.  Neuro: Alert, oriented X3, cranial nerves II-XII grossly intact, moves all extremities normally.  Psych: Patient is not psychotic, no suicidal or hemocidal ideation.  Labs on Admission: I have personally reviewed following labs and imaging studies  CBC: Recent Labs  Lab 05/23/22 0947  WBC 9.3  NEUTROABS 7.2  HGB 16.1*  HCT 47.1*  MCV 79.8*  PLT 767   Basic Metabolic Panel: Recent Labs  Lab 05/23/22 0947  NA 138  K 3.1*  CL 105  CO2 23  GLUCOSE 88  BUN 15  CREATININE 1.07*  CALCIUM 9.3  MG 2.2   GFR: Estimated Creatinine Clearance: 93.9 mL/min (A) (by C-G formula based on SCr of 1.07 mg/dL (H)). Liver  Function Tests: Recent Labs  Lab 05/23/22 0947  AST 27  ALT 14  ALKPHOS 96  BILITOT 2.6*  PROT 8.7*  ALBUMIN 4.0   No results for input(s): "LIPASE", "AMYLASE" in the last 168 hours. No results for input(s): "AMMONIA" in the last 168 hours. Coagulation Profile: No results for input(s): "INR", "PROTIME" in the last 168 hours. Cardiac Enzymes: No results for input(s): "CKTOTAL", "CKMB", "CKMBINDEX", "TROPONINI" in the last 168 hours. BNP (last 3 results) No results for input(s): "PROBNP" in the last 8760 hours. HbA1C: No results for input(s): "HGBA1C" in the last 72 hours. CBG: No results for input(s): "GLUCAP" in the last 168 hours. Lipid Profile: Recent Labs    05/23/22 0947  CHOL 140  HDL 25*  LDLCALC 101*  TRIG 71  CHOLHDL 5.6   Thyroid Function Tests: No results for input(s): "TSH", "T4TOTAL", "FREET4", "T3FREE", "THYROIDAB" in the last 72 hours. Anemia Panel:  No results for input(s): "VITAMINB12", "FOLATE", "FERRITIN", "TIBC", "IRON", "RETICCTPCT" in the last 72 hours. Urine analysis:    Component Value Date/Time   COLORURINE YELLOW (A) 05/23/2022 1022   APPEARANCEUR CLEAR (A) 05/23/2022 1022   APPEARANCEUR Clear 06/06/2013 2001   LABSPEC 1.017 05/23/2022 1022   LABSPEC 1.013 06/06/2013 2001   PHURINE 5.0 05/23/2022 1022   GLUCOSEU >=500 (A) 05/23/2022 1022   GLUCOSEU Negative 06/06/2013 2001   HGBUR LARGE (A) 05/23/2022 1022   BILIRUBINUR NEGATIVE 05/23/2022 1022   BILIRUBINUR Negative 06/06/2013 2001   KETONESUR NEGATIVE 05/23/2022 1022   PROTEINUR NEGATIVE 05/23/2022 1022   NITRITE NEGATIVE 05/23/2022 1022   LEUKOCYTESUR NEGATIVE 05/23/2022 1022   LEUKOCYTESUR Negative 06/06/2013 2001   Sepsis Labs: '@LABRCNTIP'$ (procalcitonin:4,lacticidven:4) ) Recent Results (from the past 240 hour(s))  Resp panel by RT-PCR (RSV, Flu A&B, Covid) Anterior Nasal Swab     Status: Abnormal   Collection Time: 05/23/22  9:47 AM   Specimen: Anterior Nasal Swab  Result  Value Ref Range Status   SARS Coronavirus 2 by RT PCR NEGATIVE NEGATIVE Final    Comment: (NOTE) SARS-CoV-2 target nucleic acids are NOT DETECTED.  The SARS-CoV-2 RNA is generally detectable in upper respiratory specimens during the acute phase of infection. The lowest concentration of SARS-CoV-2 viral copies this assay can detect is 138 copies/mL. A negative result does not preclude SARS-Cov-2 infection and should not be used as the sole basis for treatment or other patient management decisions. A negative result may occur with  improper specimen collection/handling, submission of specimen other than nasopharyngeal swab, presence of viral mutation(s) within the areas targeted by this assay, and inadequate number of viral copies(<138 copies/mL). A negative result must be combined with clinical observations, patient history, and epidemiological information. The expected result is Negative.  Fact Sheet for Patients:  EntrepreneurPulse.com.au  Fact Sheet for Healthcare Providers:  IncredibleEmployment.be  This test is no t yet approved or cleared by the [place] FDA and  has been authorized for detection and/or diagnosis of SARS-CoV-2 by FDA under an Emergency Use Authorization (EUA). This EUA will remain  in effect (meaning this test can be used) for the duration of the COVID-19 declaration under Section 564(b)(1) of the Act, 21 U.S.C.section 360bbb-3(b)(1), unless the authorization is terminated  or revoked sooner.       Influenza A by PCR POSITIVE (A) NEGATIVE Final   Influenza B by PCR NEGATIVE NEGATIVE Final    Comment: (NOTE) The Xpert Xpress SARS-CoV-2/FLU/RSV plus assay is intended as an aid in the diagnosis of influenza from Nasopharyngeal swab specimens and should not be used as a sole basis for treatment. Nasal washings and aspirates are unacceptable for Xpert Xpress SARS-CoV-2/FLU/RSV testing.  Fact Sheet for  Patients: EntrepreneurPulse.com.au  Fact Sheet for Healthcare Providers: IncredibleEmployment.be  This test is not yet approved or cleared by the [place] FDA and has been authorized for detection and/or diagnosis of SARS-CoV-2 by FDA under an Emergency Use Authorization (EUA). This EUA will remain in effect (meaning this test can be used) for the duration of the COVID-19 declaration under Section 564(b)(1) of the Act, 21 U.S.C. section 360bbb-3(b)(1), unless the authorization is terminated or revoked.     Resp Syncytial Virus by PCR NEGATIVE NEGATIVE Final    Comment: (NOTE) Fact Sheet for Patients: EntrepreneurPulse.com.au  Fact Sheet for Healthcare Providers: IncredibleEmployment.be  This test is not yet approved or cleared by the [place] FDA and has been authorized for detection and/or diagnosis of SARS-CoV-2 by  FDA under an Emergency Use Authorization (EUA). This EUA will remain in effect (meaning this test can be used) for the duration of the COVID-19 declaration under Section 564(b)(1) of the Act, 21 U.S.C. section 360bbb-3(b)(1), unless the authorization is terminated or revoked.  Performed at [facility], [address] [place], [place] [address]      Radiological Exams on Admission: DG Chest [facility] 1 View  Result Date: 05/23/2022 CLINICAL DATA:  Respiratory distress, chronic lung disease, shortness of breath EXAM: PORTABLE CHEST 1 VIEW COMPARISON:  Portable exam 1001 hours compared to 03/18/2022 FINDINGS: Prominent cardiac silhouette again seen. Stable mediastinal contours and pulmonary vascularity. RIGHT basilar atelectasis versus less likely infiltrate. Remaining lungs clear. No pleural effusion or pneumothorax. IMPRESSION: Enlargement of cardiac silhouette with atelectasis versus infiltrate at RIGHT base. Electronically Signed   By: Lavonia Dana M.D.   On: 05/23/2022 10:09       Assessment/Plan Principal Problem:   Acute on chronic respiratory failure with hypoxia (HCC) Active Problems:   Influenza A   Severe sepsis (HCC)   ILD (interstitial lung disease) (HCC)   Essential hypertension   Myocardial injury   Hypokalemia   Pulmonary embolism (HCC)   Pulmonary HTN (HCC)   Chronic diastolic CHF (congestive heart failure) (HCC)   Obesity, Class III, BMI 40-49.9 (morbid obesity) ([place])   Assessment and Plan:  Acute on chronic respiratory failure with hypoxia and severe sepsis due to Influenza A infection in the setting of ILD. Pt is on 8 L oxygen at her baseline, requiring BiPAP.  Patient has high risk of deterioration and intubation. Patient meets the criteria for severe sepsis with heart rate 120s, RR 24, lactic acid of 2.7.  Patient reported pressure-like chest pain earlier, which has resolved.  Patient is taking Eliquis, low suspicions for new PE  -will admit to SDU as inpatient -BiPAP -Solu-Medrol 80 mg IV bid -Tamiflu -Mucinex for cough  -Incentive spirometry -sputum culture -Nasal cannula oxygen as needed to maintain O2 saturation 93% or greater when pt is off BiPAP -Check procalcitonin level, trend lactic acid level -IV fluid: Patient received 1.5 L normal saline in ED, will not give more IV fluid due to history of pulmonary hypertension and diastolic CHF.  ILD (interstitial lung disease) (HCC) -Bronchodilators and Solu-Medrol as above  Essential hypertension -IV hydralazine as needed -Patient is on diuretics, torsemide and spironolactone  Myocardial injury: Troponin level 20 --> 23, chest pain has resolved, currently no chest pain. -Check A1c, FLP -Trend troponin -Patient is on Eliquis, will not start aspirin  Hypokalemia: Potassium 3.1.  Magnesium 2.2 -Repleted with potassium  Pulmonary embolism (HCC) -Continue Eliquis  Pulmonary HTN (HCC) -Sildenafil -Tyvaso inhaler  Chronic diastolic CHF (congestive heart failure) ([place]): 2D  echo on 05/02/2018 showed EF of 55 to 60%.  Difficult to assess volume status due to morbid obesity.  Patient has elevated BNP 586, but no pulmonary edema by chest x-ray. -Will continue home torsemide 40 mg twice daily and spironolactone 25 mg twice daily  Addendum: pt's blood pressure becomes soft, 90/60s in afternoon -will hold off torsemide and spironolactone -Give 500 cc normal saline  Obesity, Class III, BMI 40-49.9 (morbid obesity) ([place]): Body weight 109.8 kg, BMI 44.26. -Healthy diet and exercise -Encourage losing weight      DVT ppx: SQ Lovenox  Code Status: Full code  Family Communication: I offered to call her family, but patient states that I do not need to call her family since she already told her mother  and her boyfriend what is going on for her.  Disposition Plan:  Anticipate discharge back to previous environment  Consults called:  none  Admission status and Level of care: Stepdown:   as inpt       Dispo: The patient is from: Home              Anticipated d/c is to: Home              Anticipated d/c date is: 2 days              Patient currently is not medically stable to d/c.    Severity of Illness:  The appropriate patient status for this patient is INPATIENT. Inpatient status is judged to be reasonable and necessary in order to provide the required intensity of service to ensure the patient's safety. The patient's presenting symptoms, physical exam findings, and initial radiographic and laboratory data in the context of their chronic comorbidities is felt to place them at high risk for further clinical deterioration. Furthermore, it is not anticipated that the patient will be medically stable for discharge from the hospital within 2 midnights of admission.   * I certify that at the point of admission it is my clinical judgment that the patient will require inpatient hospital care spanning beyond 2 midnights from the point of admission due to high intensity of  service, high risk for further deterioration and high frequency of surveillance required.*       Date of Service 05/23/2022    [facility]   If 7PM-7AM, please contact night-coverage [email or web address] 05/23/2022, 2:01 PM

## 2022-05-23 NOTE — Progress Notes (Signed)
PHARMACIST - PHYSICIAN COMMUNICATION  CONCERNING:  Enoxaparin (Lovenox) for DVT Prophylaxis   DESCRIPTION: Patient was prescribed enoxaprin '40mg'$  q24 hours for VTE prophylaxis.   Filed Weights   05/23/22 1019  Weight: 109.8 kg (242 lb)    Body mass index is 44.26 kg/m.  Estimated Creatinine Clearance: 93.9 mL/min (A) (by C-G formula based on SCr of 1.07 mg/dL (H)).   Based on [place] patient is candidate for enoxaparin 0.'5mg'$ /kg TBW SQ every 24 hours based on BMI being >30.   RECOMMENDATION: Pharmacy has adjusted enoxaparin dose per [facility] policy.  Patient is now receiving enoxaparin 55 mg every 24 hours    Vick Frees, PharmD Clinical Pharmacist  05/23/2022 11:47 AM

## 2022-05-24 ENCOUNTER — Encounter: Payer: Self-pay | Admitting: Internal Medicine

## 2022-05-24 DIAGNOSIS — J9621 Acute and chronic respiratory failure with hypoxia: Secondary | ICD-10-CM | POA: Diagnosis not present

## 2022-05-24 LAB — CBC
HCT: 45.2 % (ref 36.0–46.0)
Hemoglobin: 15.2 g/dL — ABNORMAL HIGH (ref 12.0–15.0)
MCH: 27.5 pg (ref 26.0–34.0)
MCHC: 33.6 g/dL (ref 30.0–36.0)
MCV: 81.9 fL (ref 80.0–100.0)
Platelets: 313 10*3/uL (ref 150–400)
RBC: 5.52 MIL/uL — ABNORMAL HIGH (ref 3.87–5.11)
RDW: 19 % — ABNORMAL HIGH (ref 11.5–15.5)
WBC: 8 10*3/uL (ref 4.0–10.5)
nRBC: 1.1 % — ABNORMAL HIGH (ref 0.0–0.2)

## 2022-05-24 LAB — BASIC METABOLIC PANEL
Anion gap: 9 (ref 5–15)
BUN: 13 mg/dL (ref 6–20)
CO2: 20 mmol/L — ABNORMAL LOW (ref 22–32)
Calcium: 9.3 mg/dL (ref 8.9–10.3)
Chloride: 107 mmol/L (ref 98–111)
Creatinine, Ser: 0.88 mg/dL (ref 0.44–1.00)
GFR, Estimated: >60 mL/min (ref >60)
Glucose, Bld: 149 mg/dL — ABNORMAL HIGH (ref 70–99)
Potassium: 4.8 mmol/L (ref 3.5–5.1)
Sodium: 136 mmol/L (ref 135–145)

## 2022-05-24 LAB — MRSA NEXT GEN BY PCR, NASAL: MRSA by PCR Next Gen: NOT DETECTED

## 2022-05-24 LAB — HEMOGLOBIN A1C
Hgb A1c MFr Bld: 6.2 % — ABNORMAL HIGH (ref 4.8–5.6)
Mean Plasma Glucose: 131 mg/dL

## 2022-05-24 LAB — GLUCOSE, CAPILLARY: Glucose-Capillary: 106 mg/dL — ABNORMAL HIGH (ref 70–99)

## 2022-05-24 LAB — HIV ANTIBODY (ROUTINE TESTING W REFLEX): HIV Screen 4th Generation wRfx: NONREACTIVE

## 2022-05-24 MED ORDER — CHLORHEXIDINE GLUCONATE CLOTH 2 % EX PADS
6.0000 | MEDICATED_PAD | Freq: Every day | CUTANEOUS | Status: DC
  Administered 2022-05-24 – 2022-05-27 (×3): 6 via TOPICAL

## 2022-05-24 MED ORDER — IPRATROPIUM-ALBUTEROL 0.5-2.5 (3) MG/3ML IN SOLN
3.0000 mL | Freq: Four times a day (QID) | RESPIRATORY_TRACT | Status: DC
  Administered 2022-05-25 – 2022-05-27 (×8): 3 mL via RESPIRATORY_TRACT
  Filled 2022-05-24 (×11): qty 3

## 2022-05-24 MED ORDER — INFLUENZA VAC SPLIT QUAD 0.5 ML IM SUSY: 0.5000 mL | PREFILLED_SYRINGE | INTRAMUSCULAR | Status: DC

## 2022-05-24 NOTE — Progress Notes (Addendum)
0210 Patient alert x 4 on 8L [place] CHG completed and MRSA swab sent, patient orientated to room external cath in place on arrival changed after peri care 0230 patient arrived to ICU and request that she have BIPAP on to help her rest PRN order in. RRT to place on currently order in place.

## 2022-05-24 NOTE — TOC Initial Note (Signed)
Transition of Care [facility]) - Initial/Assessment Note    Patient Details  Name: Yesenia Carrillo MRN: [id number] Date of Birth: 1995-06-06  Transition of Care [facility]) CM/SW Contact:    Shelbie Hutching, RN Phone Number: 05/24/2022, 2:32 PM  Clinical Narrative:                  Transition of Care (TOC) Screening Note   Patient Details  Name: Yesenia Carrillo Date of Birth: June 03, 1996   Transition of Care [facility]) CM/SW Contact:    Shelbie Hutching, RN Phone Number: 05/24/2022, 2:32 PM    Transition of Care Department [facility]) has reviewed patient and no TOC needs have been identified at this time. We will continue to monitor patient advancement through interdisciplinary progression rounds. If new patient transition needs arise, please place a TOC consult.    Planned Disposition: Home Barriers to Discharge: Continued Medical Work up   Patient Goals and CMS Choice            Expected Discharge Plan and Services Planned Disposition: Home                                              Prior Living Arrangements/Services                       Activities of Daily Living Home Assistive Devices/Equipment: None ADL Screening (condition at time of admission) Patient's cognitive ability adequate to safely complete daily activities?: Yes Is the patient deaf or have difficulty hearing?: No Does the patient have difficulty seeing, even when wearing glasses/contacts?: No Does the patient have difficulty concentrating, remembering, or making decisions?: No Patient able to express need for assistance with ADLs?: Yes Does the patient have difficulty dressing or bathing?: No Independently performs ADLs?: Yes (appropriate for developmental age) Does the patient have difficulty walking or climbing stairs?: No Weakness of Legs: None Weakness of Arms/Hands: None  Permission Sought/Granted                  Emotional Assessment       Orientation: : Oriented to Self,  Oriented to Situation, Oriented to Place, Oriented to  Time   Psych Involvement: No (comment)  Admission diagnosis:  Influenza A [J10.1] Acute on chronic respiratory failure with hypoxia ([place]) [J96.21] Patient Active Problem List   Diagnosis Date Noted   Influenza A 05/23/2022   Acute on chronic respiratory failure with hypoxia ([place]) 05/23/2022   Obesity, Class III, BMI 40-49.9 (morbid obesity) ([place]) 05/23/2022   Severe sepsis ([place]) 05/23/2022   ILD (interstitial lung disease) ([place]) 05/23/2022   Myocardial injury 05/23/2022   Hypokalemia 05/23/2022   Chronic diastolic CHF (congestive heart failure) ([place]) 05/23/2022   Pulmonary HTN ([place]) 05/23/2022   Pulmonary embolism ([place]) 05/23/2022   BV (bacterial vaginosis) 03/12/2020   HSV (herpes simplex virus) anogenital infection 03/12/2020   Thrombocytosis 11/13/2019   Hx pulmonary embolism 09/10/2019   Hypotension (arterial) 09/10/2019   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   Essential hypertension 05/10/2019   Pneumonia due to COVID-19 virus 05/06/2019   COVID-19 05/04/2019   Pregnancy 05/04/2019   Acute cystitis with hematuria 05/04/2019   Sepsis due to gram-negative urinary tract infection ([place]) 11/07/2018   Sepsis ([place]) 11/05/2018   Morbid obesity with BMI of 50.0-59.9, adult ([place]) 05/14/2018   Acute respiratory failure with hypoxia ([place])  CAP (community acquired pneumonia) 04/30/2018   PCP:  [facility], [place]:   [facility] [place], [place] - [address] [facility] [place] [address] Phone: [phone] Fax: [phone]     Social Determinants of Health (SDOH) Social History: [place]: No Food Insecurity (05/24/2022)  Housing: Low Risk  (05/24/2022)  Transportation Needs: No Transportation Needs (05/24/2022)  Utilities: Not At Risk (05/24/2022)  Tobacco Use: Medium Risk (05/24/2022)   SDOH Interventions: Housing  Interventions: Intervention Not Indicated   Readmission Risk Interventions     No data to display

## 2022-05-24 NOTE — ED Notes (Signed)
Patient resting in bed free from sign of distress. Breathing unlabored speaking in full sentences with symmetric chest rise and fall. Bed low and locked with side rails raised x2. Call bell in reach and monitor in place.   

## 2022-05-24 NOTE — Progress Notes (Signed)
Order for cpap at night. Communicated with patient and patient states she does not wear cpap unit at night and does not wish to wear hospital unit. No distress noted.

## 2022-05-24 NOTE — Progress Notes (Signed)
PROGRESS NOTE    MEA OZGA  ZOX:[id number] DOB: [birth year] DOA: 05/23/2022 PCP: [facility] [place]   Brief Narrative: This 26 yrs old female with PMH significant of ILD on 8 L oxygen at home, dCHF,  pulmonary hypertension, history of intubation and ECMO, hypertension, GERD, morbid obesity with BMI 44.26, PE  on Eliquis, who presents with shortness breath for one day.  Patient reports chest pain associated with shortness of breath,  fever and chills which resolves over time. Patient was found to have severe respiratory distress,  oxygen saturation 80% on 8 L of supplemental oxygen, She was unable to speak full sentences using accessory muscles for breathing, started on BiPAP in the ED.  Patient was found to have influenza positive, COVID-negative,  lactic acid 2.7 BNP 586.  Chest x-ray shows cardiomegaly and bibasilar patchy opacity.  Patient was admitted for further evaluation.  Assessment & Plan:   Principal Problem:   Acute on chronic respiratory failure with hypoxia (HCC) Active Problems:   Influenza A   Severe sepsis (HCC)   ILD (interstitial lung disease) (HCC)   Essential hypertension   Myocardial injury   Hypokalemia   Pulmonary embolism (HCC)   Pulmonary HTN (HCC)   Chronic diastolic CHF (congestive heart failure) (HCC)   Obesity, Class III, BMI 40-49.9 (morbid obesity) ([place])  Acute on chronic hypoxic respiratory failure: Severe sepsis due to influenza A infection: Patient uses 8 L of supplemental oxygen at baseline, now requiring BiPAP. Patient has high risk for deterioration and intubation. Patient meets sepsis criteria (tachycardia, tachypnea, lactic acid 2.7) Patient also reported severe chest pain which is now resolved.. Patient takes Eliquis, low suspicion for new PE. Continue Solu-Medrol 80 mg IV twice daily Continue Tamiflu for 5 days Continue flutter valve, incentive spirometry Continue Mucinex for cough. Continue supplemental oxygen and  wean as tolerated.-  Interstitial lung disease: Continue Solu-Medrol, and bronchodilators.   Essential HTN: Continue hydralazine as needed. Continue torsemide and spironolactone.   Elevated troponin : troponin level 20 --> 23,  Chest pain has resolved, currently no chest pain. Continue Eliquis.  Hypokalemia: Replaced.  Continue to monitor.   Hx. Of Pulmonary embolism Continue Eliquis   Pulmonary HTN (HCC) Continue Sildenafil -Tyvaso inhaler   Chronic diastolic CHF: Last echo 81/19/1478 shows LVEF 55 to 60%. Difficult to assess volume status due to morbid obesity. BNP 586, No pulmonary edema on chest x-ray. Continue torsemide 40 mg twice daily and spironolactone 25 mg twice daily.  Morbid Obesity: Estimated body mass index is 42.9 kg/m as calculated from the following:   Height as of this encounter: '5\' 2"'$  (1.575 m).   Weight as of this encounter: 106.4 kg.  Diet and exercise discussed.     DVT prophylaxis: Eliquis Code Status: Full code Family Communication: No family at bed side. Disposition Plan:  Status is: Inpatient Remains inpatient appropriate because: Admitted for acute on chronic hypoxic respiratory failure and severe sepsis likely secondary to influenza A infection.   Consultants:  None  Procedures: None  Antimicrobials:  Anti-infectives (From admission, onward)    Start     Dose/Rate Route Frequency Ordered Stop   05/23/22 2200  oseltamivir (TAMIFLU) capsule 75 mg  Status:  Discontinued        75 mg Oral 2 times daily 05/23/22 1130 05/23/22 1130   05/23/22 1145  oseltamivir (TAMIFLU) capsule 75 mg        75 mg Oral 2 times daily 05/23/22 1130 05/28/22 0959   05/23/22  1130  oseltamivir (TAMIFLU) capsule 75 mg  Status:  Discontinued        75 mg Oral  Once 05/23/22 1122 05/23/22 1130   05/23/22 1100  cefTRIAXone (ROCEPHIN) 2 g in sodium chloride 0.9 % 100 mL IVPB        2 g 200 mL/hr over 30 Minutes Intravenous  Once 05/23/22 1049 05/23/22 1208    05/23/22 1100  azithromycin (ZITHROMAX) 500 mg in sodium chloride 0.9 % 250 mL IVPB        500 mg 250 mL/hr over 60 Minutes Intravenous  Once 05/23/22 1049 05/23/22 1240   05/23/22 1030  levofloxacin (LEVAQUIN) IVPB 750 mg  Status:  Discontinued        750 mg 100 mL/hr over 90 Minutes Intravenous  Once 05/23/22 1029 05/23/22 1049       Subjective: Patient was seen and examined at bedside.  Overnight events noted.  Patient reports doing much better.  She is back to her baseline oxygen requirement 8 L/min.  She is using BiPAP as needed.  Objective: Vitals:   05/24/22 0900 05/24/22 1100 05/24/22 1155 05/24/22 1200  BP:    116/73  Pulse: (!) 103 (!) 110 (!) 107 (!) 105  Resp:   (!) 26   Temp:      TempSrc:      SpO2: 95% 92% 95% 95%  Weight:      Height:        Intake/Output Summary (Last 24 hours) at 05/24/2022 1643 Last data filed at 05/24/2022 1444 Gross per 24 hour  Intake 980 ml  Output 750 ml  Net 230 ml   Filed Weights   05/23/22 1019 05/24/22 0215  Weight: 109.8 kg 106.4 kg    Examination:  General exam: Appears comfortable, not in any acute distress.  Deconditioned. Respiratory system: CTA bilaterally, respiratory effort normal, RR 15, Cardiovascular system: S1 & S2 heard, regular rate and rhythm, no murmur. Gastrointestinal system: Abdomen is soft, non tender, non distended, BS+ Central nervous system: Alert and oriented x 3. No focal neurological deficits. Extremities: Edema+ . No cyanosis, No clubbing. Skin: No rashes, lesions or ulcers Psychiatry: Judgement and insight appear normal. Mood & affect appropriate.     Data Reviewed: I have personally reviewed following labs and imaging studies  CBC: Recent Labs  Lab 05/23/22 0947 05/24/22 0302  WBC 9.3 8.0  NEUTROABS 7.2  --   HGB 16.1* 15.2*  HCT 47.1* 45.2  MCV 79.8* 81.9  PLT 338 921   Basic Metabolic Panel: Recent Labs  Lab 05/23/22 0947 05/24/22 0302  NA 138 136  K 3.1* 4.8  CL 105 107   CO2 23 20*  GLUCOSE 88 149*  BUN 15 13  CREATININE 1.07* 0.88  CALCIUM 9.3 9.3  MG 2.2  --    GFR: Estimated Creatinine Clearance: 112 mL/min (by C-G formula based on SCr of 0.88 mg/dL). Liver Function Tests: Recent Labs  Lab 05/23/22 0947  AST 27  ALT 14  ALKPHOS 96  BILITOT 2.6*  PROT 8.7*  ALBUMIN 4.0   No results for input(s): "LIPASE", "AMYLASE" in the last 168 hours. No results for input(s): "AMMONIA" in the last 168 hours. Coagulation Profile: No results for input(s): "INR", "PROTIME" in the last 168 hours. Cardiac Enzymes: No results for input(s): "CKTOTAL", "CKMB", "CKMBINDEX", "TROPONINI" in the last 168 hours. BNP (last 3 results) No results for input(s): "PROBNP" in the last 8760 hours. HbA1C: No results for input(s): "HGBA1C" in the last 72  hours. CBG: Recent Labs  Lab 05/24/22 0210  GLUCAP 106*   Lipid Profile: Recent Labs    05/23/22 0947  CHOL 140  HDL 25*  LDLCALC 101*  TRIG 71  CHOLHDL 5.6   Thyroid Function Tests: No results for input(s): "TSH", "T4TOTAL", "FREET4", "T3FREE", "THYROIDAB" in the last 72 hours. Anemia Panel: No results for input(s): "VITAMINB12", "FOLATE", "FERRITIN", "TIBC", "IRON", "RETICCTPCT" in the last 72 hours. Sepsis Labs: Recent Labs  Lab 05/23/22 0947 05/23/22 0948 05/23/22 1141  PROCALCITON <0.10  --   --   LATICACIDVEN  --  2.7* 1.8    Recent Results (from the past 240 hour(s))  Resp panel by RT-PCR (RSV, Flu A&B, Covid) Anterior Nasal Swab     Status: Abnormal   Collection Time: 05/23/22  9:47 AM   Specimen: Anterior Nasal Swab  Result Value Ref Range Status   SARS Coronavirus 2 by RT PCR NEGATIVE NEGATIVE Final    Comment: (NOTE) SARS-CoV-2 target nucleic acids are NOT DETECTED.  The SARS-CoV-2 RNA is generally detectable in upper respiratory specimens during the acute phase of infection. The lowest concentration of SARS-CoV-2 viral copies this assay can detect is 138 copies/mL. A negative result  does not preclude SARS-Cov-2 infection and should not be used as the sole basis for treatment or other patient management decisions. A negative result may occur with  improper specimen collection/handling, submission of specimen other than nasopharyngeal swab, presence of viral mutation(s) within the areas targeted by this assay, and inadequate number of viral copies(<138 copies/mL). A negative result must be combined with clinical observations, patient history, and epidemiological information. The expected result is Negative.  Fact Sheet for Patients:  EntrepreneurPulse.com.au  Fact Sheet for Healthcare Providers:  IncredibleEmployment.be  This test is no t yet approved or cleared by the [place] FDA and  has been authorized for detection and/or diagnosis of SARS-CoV-2 by FDA under an Emergency Use Authorization (EUA). This EUA will remain  in effect (meaning this test can be used) for the duration of the COVID-19 declaration under Section 564(b)(1) of the Act, 21 U.S.C.section 360bbb-3(b)(1), unless the authorization is terminated  or revoked sooner.       Influenza A by PCR POSITIVE (A) NEGATIVE Final   Influenza B by PCR NEGATIVE NEGATIVE Final    Comment: (NOTE) The Xpert Xpress SARS-CoV-2/FLU/RSV plus assay is intended as an aid in the diagnosis of influenza from Nasopharyngeal swab specimens and should not be used as a sole basis for treatment. Nasal washings and aspirates are unacceptable for Xpert Xpress SARS-CoV-2/FLU/RSV testing.  Fact Sheet for Patients: EntrepreneurPulse.com.au  Fact Sheet for Healthcare Providers: IncredibleEmployment.be  This test is not yet approved or cleared by the [place] FDA and has been authorized for detection and/or diagnosis of SARS-CoV-2 by FDA under an Emergency Use Authorization (EUA). This EUA will remain in effect (meaning this test can be used)  for the duration of the COVID-19 declaration under Section 564(b)(1) of the Act, 21 U.S.C. section 360bbb-3(b)(1), unless the authorization is terminated or revoked.     Resp Syncytial Virus by PCR NEGATIVE NEGATIVE Final    Comment: (NOTE) Fact Sheet for Patients: EntrepreneurPulse.com.au  Fact Sheet for Healthcare Providers: IncredibleEmployment.be  This test is not yet approved or cleared by the [place] FDA and has been authorized for detection and/or diagnosis of SARS-CoV-2 by FDA under an Emergency Use Authorization (EUA). This EUA will remain in effect (meaning this test can be used) for the duration of the COVID-19  declaration under Section 564(b)(1) of the Act, 21 U.S.C. section 360bbb-3(b)(1), unless the authorization is terminated or revoked.  Performed at [facility], [address] [place], [place] [address]   Culture, blood (routine x 2)     Status: None (Preliminary result)   Collection Time: 05/23/22  9:58 AM   Specimen: BLOOD  Result Value Ref Range Status   Specimen Description BLOOD RIGHT ARM  Final   Special Requests   Final    BOTTLES DRAWN AEROBIC AND ANAEROBIC Blood Culture adequate volume   Culture   Final    NO GROWTH < 24 HOURS Performed at [facility], [address] [place], [place] [address]    Report Status PENDING  Incomplete  Culture, blood (routine x 2)     Status: None (Preliminary result)   Collection Time: 05/23/22 11:41 AM   Specimen: BLOOD  Result Value Ref Range Status   Specimen Description BLOOD LEFT ANTECUBITAL  Final   Special Requests   Final    BOTTLES DRAWN AEROBIC AND ANAEROBIC Blood Culture adequate volume   Culture   Final    NO GROWTH < 24 HOURS Performed at [facility], [address] [place], [place] [address]    Report Status PENDING  Incomplete  MRSA Next Gen by PCR, Nasal     Status: None   Collection Time: 05/24/22  2:28 AM   Specimen:  Nasal Mucosa; Nasal Swab  Result Value Ref Range Status   MRSA by PCR Next Gen NOT DETECTED NOT DETECTED Final    Comment: (NOTE) The GeneXpert MRSA Assay (FDA approved for NASAL specimens only), is one component of a comprehensive MRSA colonization surveillance program. It is not intended to diagnose MRSA infection nor to guide or monitor treatment for MRSA infections. Test performance is not FDA approved in patients less than 6 years old. Performed at [facility], [address], [place], [place] [address]     Radiology Studies: DG Chest [place] 1 View  Result Date: 05/23/2022 CLINICAL DATA:  Respiratory distress, chronic lung disease, shortness of breath EXAM: PORTABLE CHEST 1 VIEW COMPARISON:  Portable exam 1001 hours compared to 03/18/2022 FINDINGS: Prominent cardiac silhouette again seen. Stable mediastinal contours and pulmonary vascularity. RIGHT basilar atelectasis versus less likely infiltrate. Remaining lungs clear. No pleural effusion or pneumothorax. IMPRESSION: Enlargement of cardiac silhouette with atelectasis versus infiltrate at RIGHT base. Electronically Signed   By: Lavonia Dana M.D.   On: 05/23/2022 10:09    Scheduled Meds:  apixaban  5 mg Oral BID   Chlorhexidine Gluconate Cloth  6 each Topical Daily   famotidine  20 mg Oral BID   [START ON 05/25/2022] influenza vac split quadrivalent PF  0.5 mL Intramuscular Tomorrow-1000   ipratropium-albuterol  3 mL Nebulization Q4H   methylPREDNISolone (SOLU-MEDROL) injection  80 mg Intravenous Q12H   oseltamivir  75 mg Oral BID   sildenafil  20 mg Oral TID   Treprostinil  18 mcg Inhalation Q4H while awake   Continuous Infusions:   LOS: 1 day    Time spent: 50 mins    Maribella Kuna, MD [facility]   If 7PM-7AM, please contact night-coverage

## 2022-05-25 DIAGNOSIS — I272 Pulmonary hypertension, unspecified: Secondary | ICD-10-CM

## 2022-05-25 DIAGNOSIS — I5032 Chronic diastolic (congestive) heart failure: Secondary | ICD-10-CM

## 2022-05-25 DIAGNOSIS — J9621 Acute and chronic respiratory failure with hypoxia: Secondary | ICD-10-CM | POA: Diagnosis not present

## 2022-05-25 LAB — CBC
HCT: 46 % (ref 36.0–46.0)
Hemoglobin: 15.4 g/dL — ABNORMAL HIGH (ref 12.0–15.0)
MCH: 27.4 pg (ref 26.0–34.0)
MCHC: 33.5 g/dL (ref 30.0–36.0)
MCV: 81.7 fL (ref 80.0–100.0)
Platelets: 303 10*3/uL (ref 150–400)
RBC: 5.63 MIL/uL — ABNORMAL HIGH (ref 3.87–5.11)
RDW: 19.1 % — ABNORMAL HIGH (ref 11.5–15.5)
WBC: 10.3 10*3/uL (ref 4.0–10.5)
nRBC: 1.4 % — ABNORMAL HIGH (ref 0.0–0.2)

## 2022-05-25 LAB — BASIC METABOLIC PANEL
Anion gap: 12 (ref 5–15)
BUN: 16 mg/dL (ref 6–20)
CO2: 19 mmol/L — ABNORMAL LOW (ref 22–32)
Calcium: 9.3 mg/dL (ref 8.9–10.3)
Chloride: 105 mmol/L (ref 98–111)
Creatinine, Ser: 0.78 mg/dL (ref 0.44–1.00)
GFR, Estimated: >60 mL/min (ref >60)
Glucose, Bld: 119 mg/dL — ABNORMAL HIGH (ref 70–99)
Potassium: 4.7 mmol/L (ref 3.5–5.1)
Sodium: 136 mmol/L (ref 135–145)

## 2022-05-25 LAB — MAGNESIUM: Magnesium: 2.6 mg/dL — ABNORMAL HIGH (ref 1.7–2.4)

## 2022-05-25 LAB — PHOSPHORUS: Phosphorus: 4 mg/dL (ref 2.5–4.6)

## 2022-05-25 MED ORDER — TORSEMIDE 20 MG PO TABS
40.0000 mg | ORAL_TABLET | Freq: Two times a day (BID) | ORAL | Status: DC
  Administered 2022-05-25 – 2022-05-27 (×4): 40 mg via ORAL
  Filled 2022-05-25 (×4): qty 2

## 2022-05-25 MED ORDER — PROCHLORPERAZINE EDISYLATE 10 MG/2ML IJ SOLN
10.0000 mg | Freq: Once | INTRAMUSCULAR | Status: AC
  Administered 2022-05-25: 10 mg via INTRAVENOUS

## 2022-05-25 MED ORDER — SPIRONOLACTONE 25 MG PO TABS
25.0000 mg | ORAL_TABLET | Freq: Every day | ORAL | Status: DC
  Administered 2022-05-25 – 2022-05-27 (×3): 25 mg via ORAL
  Filled 2022-05-25 (×3): qty 1

## 2022-05-25 MED ORDER — PROCHLORPERAZINE EDISYLATE 10 MG/2ML IJ SOLN
INTRAMUSCULAR | Status: AC
  Filled 2022-05-25: qty 2

## 2022-05-25 MED ORDER — DICYCLOMINE HCL 20 MG PO TABS
20.0000 mg | ORAL_TABLET | Freq: Once | ORAL | Status: AC
  Administered 2022-05-25: 20 mg via ORAL
  Filled 2022-05-25: qty 1

## 2022-05-25 MED ORDER — METHYLPREDNISOLONE SODIUM SUCC 40 MG IJ SOLR
40.0000 mg | Freq: Every day | INTRAMUSCULAR | Status: DC
  Administered 2022-05-26 – 2022-05-27 (×2): 40 mg via INTRAVENOUS
  Filled 2022-05-25 (×2): qty 1

## 2022-05-25 NOTE — Consult Note (Signed)
NAME:  Yesenia Carrillo, MRN:  [id number], DOB:  08-27-1995, LOS: 2 ADMISSION DATE:  05/23/2022, CONSULTATION DATE:  05/25/2022 REFERRING MD:  Yesenia Clamp, MD, CHIEF COMPLAINT:  Pulmonary Hypertension.   History of Present Illness:  26 year old female with a past medical history of pulmonary hypertension followed at [facility] presenting to the hospital with shortness of breath and respiratory failure in the setting of influenza infection.  She reports that her symptoms started around Tuesday with chills and a low grade fever. This progressed to shortness of breath and cough prompting her to come to the hospital. Upon presentation to the ED on 05/23/2022, she was noted to be hypoxic down to the 80's on her home oxygen (of 8 liters). She was started on BiPAP and admitted to the step down unit. She was found to be positive for influenza A.  The patient is followed closely at [facility], where she is seen by cardiology and pulmonary for HFpEF and the diagnosis of pulmonary hypertension. She was last seen there in October of 2023.   Review of the medical record is notable for the following: She has had multiple hospitalizations dating back to 2019 when she developed ARDS requiring EMCO (complicated by retroperitoneal bleed and a provoked PE for which she received 3 months of Eliquis). She again developed ARDS requiring intubation in June of 2020. She was then hospitalized in November of 2020 secondary to [place] (while pregnant -  treated with remdesivir and steroids). She was intubated in April of 2021 secondary to ARDS due to rhinovirus infection. She was hospitalized May to June of 2021 for hypoxic and hypercapnic respiratory failure requiring intubation. At that point, imaging was notable for extensive mediastinal lymphadenopathy (EBUS with acute inflammation and few histiocytes). Imaging through out was also concerning for an interstitial parenchymal process. She was re-admitted February through March of 2022 (while [redacted]  weeks pregnant) with concern for preeclampsia. At that time, TTE was notable for elevated RV systolic pressure up to 50. Pulmonary hypertension evaluation was initiated (VQ scan with wedge-shaped defects in the RUL/RLL; RHC with mean PA 43, PCWP 22, PVR 4 wood consistent with pre and post capillary hypertension). The pulmonary hypertension was attributed to group II and III at that point. The most recent right heart cath was from November 24, 2021 and showed the following: RA 12/10, RV 90/5, PA 90/40 (56), PCWP 10, FICK CO 3.12, Fick CI 1.54, thermal CO 3.07, thermal CI 1.51, PVR 15 wood units.  At home, she is on 8 liters of nasal cannula. She is managed with inhaled triprostinil and sildenafil for her pulmonary hypertension. She is to have a sleep study ordered by her outpatient pulmonologist. Further workup of her pulmonary hypertension included a VQ scan but her outpatient team did not suspect CTEPH as a cause behind her symptoms. She was also seen for concern of ILD with a broad differential (NSIP, HSP, COP) with discussions about possible surgical biopsy but this was deemed too high risk (50% intra/post op mortality). She's also followed by cardiology (last seen October of 2023) where she was given IV lasix during the clinic visit. She was switched to torsemide 40 mg PO bid (with extra 20 mg PO bid PRN) and continued on Spironolactone (25 mg daily) and Empagliflozin (10 mg daily). She is also maintained on Apixaban 5 mg bid.  Social: smoked marijuana 2015-2019. Used to be exposed to pet birds and mold (no more). Has one daughter.  Pertinent  Medical History  -HFpEF -Pulmonary Hypertension (  Groups 2 and 3) -History of provoked PE -history of splenic vein thrombosis and infarcts -recurrent episodes of respiratory failure -sickle cell train -morbid obesity  Objective   Blood pressure (!) 88/66, pulse 83, temperature 97.8 F (36.6 C), temperature source Oral, resp. rate (!) 28, height '5\' 2"'$  (1.575 m),  weight 106 kg, last menstrual period 05/22/2022, SpO2 100 %, unknown if currently breastfeeding.    FiO2 (%):  [40 %] 40 %   Intake/Output Summary (Last 24 hours) at 05/25/2022 1319 Last data filed at 05/25/2022 1059 Gross per 24 hour  Intake 960 ml  Output 1600 ml  Net -640 ml   Filed Weights   05/23/22 1019 05/24/22 0215 05/25/22 0500  Weight: 109.8 kg 106.4 kg 106 kg    Examination: Physical Exam Constitutional:      General: She is not in acute distress.    Appearance: She is obese. She is not ill-appearing.  HENT:     Head: Normocephalic.  Eyes:     Pupils: Pupils are equal, round, and reactive to light.  Cardiovascular:     Rate and Rhythm: Normal rate and regular rhythm.     Pulses: Normal pulses.     Heart sounds: Normal heart sounds.  Pulmonary:     Breath sounds: Normal breath sounds. No wheezing or rales.  Abdominal:     General: There is distension.     Palpations: Abdomen is soft.  Musculoskeletal:     Cervical back: Normal range of motion.     Right lower leg: Edema present.     Left lower leg: Edema present.  Neurological:     General: No focal deficit present.     Mental Status: She is alert and oriented to person, place, and time. Mental status is at baseline.     Assessment & Plan:   Yesenia Carrillo is a pleasant 26 year old female with a history of pulmonary hypertension presenting to the hospital with acute on chronic hypoxic respiratory failure secondary to influenza.  #Acute on Chronic Hypoxic Respiratory Failure #Influenza A infection #Pulmonary Hypertension #Heart Failure with Preserved Ejection Fraction #Possible ILD  She has had multiple admissions in the past with recurrent respiratory failure in the settings of infection, and has had a workup that showed her to have heart failure with preserved ejection fraction as well as pulmonary hypertension.  In review of her medical history, my impression is that she has had group 2 pulmonary  hypertension (RHC 08/2020 with mean PA pressure of 43, PCWP 22, PVR 4 woods units) as well as possible groups 1, 3 or 5. RHC from June of 2023 is mostly showing pre-capillary pulmonary hypertension (mean PA 56, PCWP 10, PVR 15 wood units). She was started on sildenafil and inhaled triprostinil. The pre-capillary pulmonary hypertension is suggestive of group 1 physiology in addition to the presumed group 3 category. She's had a positive ANA in the past but no specific auto-antibody was identified. She could be a candidate for further PAH directed therapy at the discretion of her outpatient pulmonary providers.  In addition to pulmonary hypertension, she is also being worked up for possible interstitial lung disease. Based on the report from the previous high resolution CT scans, the differential has included non-specific interstitial pneumonitis (NSIP), hypersensitivity pneumonitis (HSP), and organizing pneumonia (OP). She was not deemed to be a candidate for surgical biopsy nor immune suppression by her outpatient providers and radiographic surveillance has been pursued. Given she is acutely infected with influenza, I would  not favor repeating a CT scan at the moment. I do not believe she is having a flare up of her ILD based on her quick improvement and her lung examination. Her presumed ILD had not been steroid responsive in the past nor was she a candidate for biopsy (based on surgical risk). I would favor decreasing her steroids down to 40 mg daily and subsequently tapering off.  RHC from 2022 is consistent with HFpEF given the elevated PCWP and her therapy is optimized with a loop diuretic, mineralocorticoid receptor antagonist, and SGLT2 inhibitor. She should be given spot doses of diuresis in addition to her current regimen based on her weight. I would favor an addition one time dose of IV furosemide tomorrow if her blood pressure allows. I would also recommend obtaining a TTE to evaluate her RV  function.  Agree with continuing therapy targeted at influenza. Would recommend drastically reducing her steroid dose as she does not have a diagnosis of reactive airway disease and previously did not benefit from steroids. Patient has skipped multiple appointments for a sleep study to qualify her for a CPAP machine, and she will be rescheduling this.  Recommendations: -TTE -IV Furosemide 40 mg once tomorrow AM -Continue inhaled triprostinil 18 mcg 4 times daily -continue sildenafil 20 mg tid -continue HFpEF directed therapy (torsemide, spironolactone, jardiance) -continue Apixaban for her history of VTE as well as splenic vein thrombosis -decrease steroids to 40 mg daily -continue home oxygen, goal SpO2 > 92% -close outpatient follow up with her pulmonologist   Labs   CBC: Recent Labs  Lab 05/23/22 0947 05/24/22 0302 05/25/22 0652  WBC 9.3 8.0 10.3  NEUTROABS 7.2  --   --   HGB 16.1* 15.2* 15.4*  HCT 47.1* 45.2 46.0  MCV 79.8* 81.9 81.7  PLT 338 313 124    Basic Metabolic Panel: Recent Labs  Lab 05/23/22 0947 05/24/22 0302 05/25/22 0652  NA 138 136 136  K 3.1* 4.8 4.7  CL 105 107 105  CO2 23 20* 19*  GLUCOSE 88 149* 119*  BUN '15 13 16  '$ CREATININE 1.07* 0.88 0.78  CALCIUM 9.3 9.3 9.3  MG 2.2  --  2.6*  PHOS  --   --  4.0   GFR: Estimated Creatinine Clearance: 123 mL/min (by C-G formula based on SCr of 0.78 mg/dL). Recent Labs  Lab 05/23/22 0947 05/23/22 0948 05/23/22 1141 05/24/22 0302 05/25/22 0652  PROCALCITON <0.10  --   --   --   --   WBC 9.3  --   --  8.0 10.3  LATICACIDVEN  --  2.7* 1.8  --   --     Liver Function Tests: Recent Labs  Lab 05/23/22 0947  AST 27  ALT 14  ALKPHOS 96  BILITOT 2.6*  PROT 8.7*  ALBUMIN 4.0   No results for input(s): "LIPASE", "AMYLASE" in the last 168 hours. No results for input(s): "AMMONIA" in the last 168 hours.  ABG    Component Value Date/Time   PHART 7.44 10/29/2019 1224   PCO2ART 37 10/29/2019 1224    PO2ART 60 (L) 10/29/2019 1224   HCO3 25.9 05/23/2022 0947   ACIDBASEDEF 0.6 10/30/2019 0649   O2SAT 87.5 05/23/2022 0947     Coagulation Profile: No results for input(s): "INR", "PROTIME" in the last 168 hours.  Cardiac Enzymes: No results for input(s): "CKTOTAL", "CKMB", "CKMBINDEX", "TROPONINI" in the last 168 hours.  HbA1C: Hgb A1c MFr Bld  Date/Time Value Ref Range Status  05/24/2022 03:02  AM 6.2 (H) 4.8 - 5.6 % Final    Comment:    (NOTE)         Prediabetes: 5.7 - 6.4         Diabetes: >6.4         Glycemic control for adults with diabetes: <7.0   05/04/2019 06:39 PM 5.1 4.8 - 5.6 % Final    Comment:    (NOTE) Pre diabetes:          5.7%-6.4% Diabetes:              >6.4% Glycemic control for   <7.0% adults with diabetes     CBG: Recent Labs  Lab 05/24/22 0210  GLUCAP 106*    Review of Systems:   Review of Systems  Constitutional:  Positive for chills and fever. Negative for weight loss.  Respiratory:  Positive for shortness of breath. Negative for cough, hemoptysis and sputum production.   Cardiovascular:  Positive for leg swelling. Negative for chest pain.  Gastrointestinal:  Negative for abdominal pain.     Past Medical History:  She,  has a past medical history of COVID-19 (04/2019), Hypertension, Interstitial lung disease ([place]), Morbid obesity with BMI of 40.0-44.9, adult ([place]), and Pre-eclampsia.   Surgical History:   Past Surgical History:  Procedure Laterality Date   TONSILLECTOMY     WISDOM TOOTH EXTRACTION       Social History:   reports that she quit smoking about 6 years ago. Her smoking use included cigarettes. She has never used smokeless tobacco. She reports that she does not currently use alcohol. She reports that she does not use drugs.   Family History:  Her family history includes Healthy in her father and mother.   Allergies Allergies  Allergen Reactions   Ampicillin Rash and Other (See Comments)    Patient and family  cannot remember the specifics of the reaction.   Penicillins Hives     Home Medications  Prior to Admission medications   Medication Sig Start Date End Date Taking? Authorizing Provider  ELIQUIS 5 MG TABS tablet Take 5 mg by mouth 2 (two) times daily.   Yes [provider]  empagliflozin (JARDIANCE) 10 MG TABS tablet Take 10 mg by mouth daily. 10/19/20  Yes [provider]  famotidine (PEPCID) 20 MG tablet Take 20 mg by mouth 2 (two) times daily. 08/29/21 08/29/22 Yes [provider]  sildenafil (REVATIO) 20 MG tablet Take 20 mg by mouth 3 (three) times daily. 04/19/22  Yes [provider]  spironolactone (ALDACTONE) 25 MG tablet Take 25 mg by mouth daily.   Yes [provider]  torsemide (DEMADEX) 20 MG tablet Take 40 mg by mouth 2 (two) times daily. 05/10/22 05/10/23 Yes [provider]  TYVASO REFILL 0.6 MG/ML SOLN Inhale 18 mcg into the lungs every 4 (four) hours while awake. 11/30/21  Yes [provider]  albuterol (VENTOLIN HFA) 108 (90 Base) MCG/ACT inhaler Inhale into the lungs. 07/25/21   [provider]  amLODipine (NORVASC) 10 MG tablet Take 1 tablet by mouth daily. Patient not taking: Reported on 05/23/2022 10/19/20   [provider]  azithromycin (ZITHROMAX) 250 MG tablet Take 250 mg by mouth daily. Patient not taking: Reported on 05/23/2022 07/26/21   [provider]  furosemide (LASIX) 40 MG tablet Take 1 tablet by mouth daily. Patient not taking: Reported on 05/23/2022 07/12/21   [provider]     Total care time: 95 minutes  Armando Reichert, MD [facility] 05/25/2022 4:14 PM

## 2022-05-25 NOTE — Progress Notes (Signed)
PROGRESS NOTE    Yesenia Carrillo  MEQ:[id number] DOB: [birth year] DOA: 05/23/2022 [facility]: [facility] [place] [facility]   Brief Narrative: This 26 yrs old female with PMH significant of ILD on 8 L oxygen at home, dCHF,  pulmonary hypertension, history of intubation and ECMO, hypertension, GERD, morbid obesity with BMI 44.26, PE  on Eliquis, who presents with shortness breath for one day.  Patient reports chest pain associated with shortness of breath,  fever and chills which resolves over time. Patient was found to have severe respiratory distress,  oxygen saturation 80% on 8 L of supplemental oxygen, She was unable to speak full sentences using accessory muscles for breathing, started on BiPAP in the ED.  Patient was found to have influenza positive, COVID-negative,  lactic acid 2.7 BNP 586.  Chest x-ray shows cardiomegaly and bibasilar patchy opacity.  Patient was admitted for further evaluation.  Assessment & Plan:   Principal Problem:   Acute on chronic respiratory failure with hypoxia (HCC) Active Problems:   Influenza A   Severe sepsis (HCC)   ILD (interstitial lung disease) (HCC)   Essential hypertension   Myocardial injury   Hypokalemia   Pulmonary embolism (HCC)   Pulmonary HTN (HCC)   Chronic diastolic CHF (congestive heart failure) (HCC)   Obesity, Class III, BMI 40-49.9 (morbid obesity) ([place])  Acute on chronic hypoxic respiratory failure: Severe sepsis due to influenza A infection: Patient uses 8 L of supplemental oxygen at baseline, now requiring BiPAP. Patient has high risk for deterioration and intubation. Patient meets sepsis criteria (tachycardia, tachypnea, lactic acid 2.7) Patient also reported severe chest pain which is now resolved.. Patient takes Eliquis, low suspicion for new PE. Continue Solu-Medrol 80 mg IV twice daily Continue Tamiflu for 5 days Continue flutter valve, incentive spirometry Continue Mucinex for cough. Continue supplemental oxygen and  wean as tolerated. Pulmonology consulted, awaiting recommendation.  Interstitial lung disease: Continue Solu-Medrol, and bronchodilators.   Essential HTN: Continue hydralazine as needed. Continue torsemide and spironolactone.   Elevated troponin : troponin level 20 --> 23,  Chest pain has resolved, currently no chest pain. Continue Eliquis.  Hypokalemia: Replaced.  Continue to monitor.   Hx. Of Pulmonary embolism Continue Eliquis   Pulmonary HTN (HCC) Continue Sildenafil -Tyvaso inhaler   Chronic diastolic CHF: Last echo 29/79/8921 shows LVEF 55 to 60%. Difficult to assess volume status due to morbid obesity. BNP 586, No pulmonary edema on chest x-ray. Continue torsemide 40 mg twice daily and spironolactone 25 mg twice daily.  Morbid Obesity: Estimated body mass index is 42.74 kg/m as calculated from the following:   Height as of this encounter: '5\' 2"'$  (1.575 m).   Weight as of this encounter: 106 kg.  Diet and exercise discussed.     DVT prophylaxis: Eliquis Code Status: Full code Family Communication: No family at bed side. Disposition Plan:  Status is: Inpatient Remains inpatient appropriate because: Admitted for acute on chronic hypoxic respiratory failure and severe sepsis likely secondary to influenza A infection.    Consultants:  Pulmonology  Procedures: None  Antimicrobials:  Anti-infectives (From admission, onward)    Start     Dose/Rate Route Frequency Ordered Stop   05/23/22 2200  oseltamivir (TAMIFLU) capsule 75 mg  Status:  Discontinued        75 mg Oral 2 times daily 05/23/22 1130 05/23/22 1130   05/23/22 1145  oseltamivir (TAMIFLU) capsule 75 mg        75 mg Oral 2 times daily 05/23/22 1130  05/28/22 0959   05/23/22 1130  oseltamivir (TAMIFLU) capsule 75 mg  Status:  Discontinued        75 mg Oral  Once 05/23/22 1122 05/23/22 1130   05/23/22 1100  cefTRIAXone (ROCEPHIN) 2 g in sodium chloride 0.9 % 100 mL IVPB        2 g 200 mL/hr over 30  Minutes Intravenous  Once 05/23/22 1049 05/23/22 1208   05/23/22 1100  azithromycin (ZITHROMAX) 500 mg in sodium chloride 0.9 % 250 mL IVPB        500 mg 250 mL/hr over 60 Minutes Intravenous  Once 05/23/22 1049 05/23/22 1240   05/23/22 1030  levofloxacin (LEVAQUIN) IVPB 750 mg  Status:  Discontinued        750 mg 100 mL/hr over 90 Minutes Intravenous  Once 05/23/22 1029 05/23/22 1049       Subjective: Patient was seen and examined at bedside.  Overnight events noted.   Patient reports doing much better.  She was wearing BiPAP when seen in the morning She remains on 8 L of oxygen which is her baseline,  intermittently requiring BiPAP.    Objective: Vitals:   05/25/22 0732 05/25/22 0740 05/25/22 0800 05/25/22 1000  BP:   122/88 114/88  Pulse: 85 79 90 85  Resp: (!) 22 (!) 28 (!) 25 (!) 28  Temp:      TempSrc:      SpO2: 94% 98% 98% 93%  Weight:      Height:        Intake/Output Summary (Last 24 hours) at 05/25/2022 1251 Last data filed at 05/25/2022 0740 Gross per 24 hour  Intake 720 ml  Output 1600 ml  Net -880 ml   Filed Weights   05/23/22 1019 05/24/22 0215 05/25/22 0500  Weight: 109.8 kg 106.4 kg 106 kg    Examination:  General exam: Appears Comfortable, not in any acute distress, deconditioned. Respiratory system: Decreased breath sounds, normal respiratory effort.  RR 15 Cardiovascular system: S1 & S2 heard, regular rate and rhythm, no murmur. Gastrointestinal system: Abdomen is soft, non tender, non distended, BS+ Central nervous system: Alert and oriented x 3. No focal neurological deficits. Extremities: Edema+ . No cyanosis, No clubbing. Skin: No rashes, lesions or ulcers Psychiatry: Judgement and insight appear normal. Mood & affect appropriate.     Data Reviewed: I have personally reviewed following labs and imaging studies  CBC: Recent Labs  Lab 05/23/22 0947 05/24/22 0302 05/25/22 0652  WBC 9.3 8.0 10.3  NEUTROABS 7.2  --   --   HGB 16.1*  15.2* 15.4*  HCT 47.1* 45.2 46.0  MCV 79.8* 81.9 81.7  PLT 338 313 542   Basic Metabolic Panel: Recent Labs  Lab 05/23/22 0947 05/24/22 0302 05/25/22 0652  NA 138 136 136  K 3.1* 4.8 4.7  CL 105 107 105  CO2 23 20* 19*  GLUCOSE 88 149* 119*  BUN '15 13 16  '$ CREATININE 1.07* 0.88 0.78  CALCIUM 9.3 9.3 9.3  MG 2.2  --  2.6*  PHOS  --   --  4.0   GFR: Estimated Creatinine Clearance: 123 mL/min (by C-G formula based on SCr of 0.78 mg/dL). Liver Function Tests: Recent Labs  Lab 05/23/22 0947  AST 27  ALT 14  ALKPHOS 96  BILITOT 2.6*  PROT 8.7*  ALBUMIN 4.0   No results for input(s): "LIPASE", "AMYLASE" in the last 168 hours. No results for input(s): "AMMONIA" in the last 168 hours. Coagulation Profile: No  results for input(s): "INR", "PROTIME" in the last 168 hours. Cardiac Enzymes: No results for input(s): "CKTOTAL", "CKMB", "CKMBINDEX", "TROPONINI" in the last 168 hours. BNP (last 3 results) No results for input(s): "PROBNP" in the last 8760 hours. HbA1C: Recent Labs    05/24/22 0302  HGBA1C 6.2*   CBG: Recent Labs  Lab 05/24/22 0210  GLUCAP 106*   Lipid Profile: Recent Labs    05/23/22 0947  CHOL 140  HDL 25*  LDLCALC 101*  TRIG 71  CHOLHDL 5.6   Thyroid Function Tests: No results for input(s): "TSH", "T4TOTAL", "FREET4", "T3FREE", "THYROIDAB" in the last 72 hours. Anemia Panel: No results for input(s): "VITAMINB12", "FOLATE", "FERRITIN", "TIBC", "IRON", "RETICCTPCT" in the last 72 hours. Sepsis Labs: Recent Labs  Lab 05/23/22 0947 05/23/22 0948 05/23/22 1141  PROCALCITON <0.10  --   --   LATICACIDVEN  --  2.7* 1.8    Recent Results (from the past 240 hour(s))  Resp panel by RT-PCR (RSV, Flu A&B, Covid) Anterior Nasal Swab     Status: Abnormal   Collection Time: 05/23/22  9:47 AM   Specimen: Anterior Nasal Swab  Result Value Ref Range Status   SARS Coronavirus 2 by RT PCR NEGATIVE NEGATIVE Final    Comment: (NOTE) SARS-CoV-2 target  nucleic acids are NOT DETECTED.  The SARS-CoV-2 RNA is generally detectable in upper respiratory specimens during the acute phase of infection. The lowest concentration of SARS-CoV-2 viral copies this assay can detect is 138 copies/mL. A negative result does not preclude SARS-Cov-2 infection and should not be used as the sole basis for treatment or other patient management decisions. A negative result may occur with  improper specimen collection/handling, submission of specimen other than nasopharyngeal swab, presence of viral mutation(s) within the areas targeted by this assay, and inadequate number of viral copies(<138 copies/mL). A negative result must be combined with clinical observations, patient history, and epidemiological information. The expected result is Negative.  Fact Sheet for Patients:  EntrepreneurPulse.com.au  Fact Sheet for Healthcare Providers:  IncredibleEmployment.be  This test is no t yet approved or cleared by the [place] FDA and  has been authorized for detection and/or diagnosis of SARS-CoV-2 by FDA under an Emergency Use Authorization (EUA). This EUA will remain  in effect (meaning this test can be used) for the duration of the COVID-19 declaration under Section 564(b)(1) of the Act, 21 U.S.C.section 360bbb-3(b)(1), unless the authorization is terminated  or revoked sooner.       Influenza A by PCR POSITIVE (A) NEGATIVE Final   Influenza B by PCR NEGATIVE NEGATIVE Final    Comment: (NOTE) The Xpert Xpress SARS-CoV-2/FLU/RSV plus assay is intended as an aid in the diagnosis of influenza from Nasopharyngeal swab specimens and should not be used as a sole basis for treatment. Nasal washings and aspirates are unacceptable for Xpert Xpress SARS-CoV-2/FLU/RSV testing.  Fact Sheet for Patients: EntrepreneurPulse.com.au  Fact Sheet for Healthcare  Providers: IncredibleEmployment.be  This test is not yet approved or cleared by the [place] FDA and has been authorized for detection and/or diagnosis of SARS-CoV-2 by FDA under an Emergency Use Authorization (EUA). This EUA will remain in effect (meaning this test can be used) for the duration of the COVID-19 declaration under Section 564(b)(1) of the Act, 21 U.S.C. section 360bbb-3(b)(1), unless the authorization is terminated or revoked.     Resp Syncytial Virus by PCR NEGATIVE NEGATIVE Final    Comment: (NOTE) Fact Sheet for Patients: EntrepreneurPulse.com.au  Fact Sheet for Healthcare Providers:  IncredibleEmployment.be  This test is not yet approved or cleared by the [place] and has been authorized for detection and/or diagnosis of SARS-CoV-2 by FDA under an Emergency Use Authorization (EUA). This EUA will remain in effect (meaning this test can be used) for the duration of the COVID-19 declaration under Section 564(b)(1) of the Act, 21 U.S.C. section 360bbb-3(b)(1), unless the authorization is terminated or revoked.  Performed at [facility], [address] [place], [place] [address]   Culture, blood (routine x 2)     Status: None (Preliminary result)   Collection Time: 05/23/22  9:58 AM   Specimen: BLOOD  Result Value Ref Range Status   Specimen Description BLOOD RIGHT ARM  Final   Special Requests   Final    BOTTLES DRAWN AEROBIC AND ANAEROBIC Blood Culture adequate volume   Culture   Final    NO GROWTH 2 DAYS Performed at [facility], [address] [place], [place] [address]    Report Status PENDING  Incomplete  Culture, blood (routine x 2)     Status: None (Preliminary result)   Collection Time: 05/23/22 11:41 AM   Specimen: BLOOD  Result Value Ref Range Status   Specimen Description BLOOD LEFT ANTECUBITAL  Final   Special Requests   Final    BOTTLES DRAWN AEROBIC  AND ANAEROBIC Blood Culture adequate volume   Culture   Final    NO GROWTH 2 DAYS Performed at [facility], [address], [place], [place] [address]    Report Status PENDING  Incomplete  MRSA Next Gen by PCR, Nasal     Status: None   Collection Time: 05/24/22  2:28 AM   Specimen: Nasal Mucosa; Nasal Swab  Result Value Ref Range Status   MRSA by PCR Next Gen NOT DETECTED NOT DETECTED Final    Comment: (NOTE) The GeneXpert MRSA Assay (FDA approved for NASAL specimens only), is one component of a comprehensive MRSA colonization surveillance program. It is not intended to diagnose MRSA infection nor to guide or monitor treatment for MRSA infections. Test performance is not FDA approved in patients less than 3 years old. Performed at [facility], [address], [place],  [address]     Radiology Studies: No results found.  Scheduled Meds:  apixaban  5 mg Oral BID   Chlorhexidine Gluconate Cloth  6 each Topical Daily   famotidine  20 mg Oral BID   influenza vac split quadrivalent PF  0.5 mL Intramuscular Tomorrow-1000   ipratropium-albuterol  3 mL Nebulization Q6H   methylPREDNISolone (SOLU-MEDROL) injection  80 mg Intravenous Q12H   oseltamivir  75 mg Oral BID   sildenafil  20 mg Oral TID   spironolactone  25 mg Oral Daily   torsemide  40 mg Oral BID   Treprostinil  18 mcg Inhalation Q4H while awake   Continuous Infusions:   LOS: 2 days    Time spent: 35 mins    Saveah Bahar, MD [facility]   If 7PM-7AM, please contact night-coverage

## 2022-05-25 NOTE — Progress Notes (Signed)
Pt A/Ox4, VS stable. 8 L HF. Transferred to new room 246. Phone, charger and clothes are with the patient. Family updated by the patient.

## 2022-05-25 NOTE — Progress Notes (Signed)
Pt c/o nausea after zofran and 9/10 abdominal pain/cramping.  Sharion Settler, NP on call, notified.  Orders given.  Pt also has had several incontinent episodes.  Afterwards was placed back on Bipap.

## 2022-05-26 ENCOUNTER — Inpatient Hospital Stay (HOSPITAL_COMMUNITY)
Admit: 2022-05-26 | Discharge: 2022-05-26 | Disposition: A | Discharge status: Home / Self Care | Payer: Medicaid Other | Attending: Student in an Organized Health Care Education/Training Program | Admitting: Student in an Organized Health Care Education/Training Program

## 2022-05-26 DIAGNOSIS — R0609 Other forms of dyspnea: Secondary | ICD-10-CM | POA: Diagnosis not present

## 2022-05-26 DIAGNOSIS — J9621 Acute and chronic respiratory failure with hypoxia: Secondary | ICD-10-CM | POA: Diagnosis not present

## 2022-05-26 LAB — ECHOCARDIOGRAM COMPLETE
AR max vel: 2.68 cm2
AV Area VTI: 2.6 cm2
AV Area mean vel: 2.53 cm2
AV Mean grad: 2 mmHg
AV Peak grad: 3 mmHg
Ao pk vel: 0.86 m/s
Area-P 1/2: 4.39 cm2
Calc EF: 58.5 %
Height: 62 in
MV M vel: 4 m/s
MV Peak grad: 63.9 mmHg
S' Lateral: 2.8 cm
Single Plane A2C EF: 57.5 %
Single Plane A4C EF: 61.6 %
Weight: 3739 oz

## 2022-05-26 MED ORDER — FUROSEMIDE 10 MG/ML IJ SOLN
20.0000 mg | Freq: Once | INTRAMUSCULAR | Status: AC
  Administered 2022-05-26: 20 mg via INTRAVENOUS
  Filled 2022-05-26: qty 2

## 2022-05-26 NOTE — Progress Notes (Signed)
  Echocardiogram 2D Echocardiogram has been performed.  Yesenia Carrillo 05/26/2022, 5:00 PM

## 2022-05-26 NOTE — Progress Notes (Signed)
PROGRESS NOTE    Yesenia Carrillo  PFX:[id number] DOB: 1995-09-28 DOA: 05/23/2022 PCP: [facility] [place]   Brief Narrative: This 26 yrs old female with PMH significant of ILD on 8 L oxygen at home, dCHF,  pulmonary hypertension, history of intubation and ECMO, hypertension, GERD, morbid obesity with BMI 44.26, PE  on Eliquis, who presents with shortness breath for one day.  Patient reports chest pain associated with shortness of breath,  fever and chills which resolves over time. Patient was found to have severe respiratory distress,  oxygen saturation 80% on 8 L of supplemental oxygen, She was unable to speak full sentences using accessory muscles for breathing, started on BiPAP in the ED.  Patient was found to have influenza positive, COVID-negative,  lactic acid 2.7 BNP 586.  Chest x-ray shows cardiomegaly and bibasilar patchy opacity.  Patient was admitted for further evaluation.  Assessment & Plan:   Principal Problem:   Acute on chronic respiratory failure with hypoxia (HCC) Active Problems:   Influenza A   Severe sepsis (HCC)   ILD (interstitial lung disease) (HCC)   Essential hypertension   Myocardial injury   Hypokalemia   Pulmonary embolism (HCC)   Pulmonary HTN (HCC)   Chronic diastolic CHF (congestive heart failure) (HCC)   Obesity, Class III, BMI 40-49.9 (morbid obesity) ([place])  Acute on chronic hypoxic respiratory failure: Severe sepsis due to influenza A infection: Patient uses 8 L of supplemental oxygen at baseline, now requiring BiPAP. Patient has high risk for deterioration and intubation. Patient meets sepsis criteria (tachycardia, tachypnea, lactic acid 2.7) Patient also reported severe chest pain which is now resolved.. Patient takes Eliquis, low suspicion for new PE. Reduce Solu-Medrol 40 mg IV twice daily. Continue Tamiflu for 5 days Continue flutter valve, incentive spirometry Continue Mucinex for cough. Continue supplemental oxygen and wean  as tolerated. Pulmonology consulted, recommended echocardiogram, started on sildenafil, steroids reduced to 40 mg daily.  Also recommended to have outpatient pulm follow-up.  Interstitial lung disease: Continue Solu-Medrol, and bronchodilators.   Essential HTN: Continue hydralazine as needed. Continue torsemide and spironolactone.   Elevated troponin : troponin level 20 --> 23,  Chest pain has resolved, currently no chest pain. Continue Eliquis.  Hypokalemia: Replaced.  Continue to monitor.   Hx. Of Pulmonary embolism Continue Eliquis   Pulmonary HTN (HCC) Continue Sildenafil -Tyvaso inhaler   Chronic diastolic CHF: Last echo 32/99/2426 shows LVEF 55 to 60%. Difficult to assess volume status due to morbid obesity. BNP 586, No pulmonary edema on chest x-ray. Continue torsemide 40 mg twice daily and spironolactone 25 mg twice daily.  Morbid Obesity: Estimated body mass index is 42.74 kg/m as calculated from the following:   Height as of this encounter: '5\' 2"'$  (1.575 m).   Weight as of this encounter: 106 kg.  Diet and exercise discussed.     DVT prophylaxis: Eliquis Code Status: Full code Family Communication: No family at bed side. Disposition Plan:  Status is: Inpatient Remains inpatient appropriate because: Admitted for acute on chronic hypoxic respiratory failure and severe sepsis likely secondary to influenza A infection.  Patient is doing much better, echocardiogram is completed report pending    Consultants:  Pulmonology  Procedures: None  Antimicrobials:  Anti-infectives (From admission, onward)    Start     Dose/Rate Route Frequency Ordered Stop   05/23/22 2200  oseltamivir (TAMIFLU) capsule 75 mg  Status:  Discontinued        75 mg Oral 2 times daily 05/23/22  1130 05/23/22 1130   05/23/22 1145  oseltamivir (TAMIFLU) capsule 75 mg        75 mg Oral 2 times daily 05/23/22 1130 05/28/22 0959   05/23/22 1130  oseltamivir (TAMIFLU) capsule 75 mg  Status:   Discontinued        75 mg Oral  Once 05/23/22 1122 05/23/22 1130   05/23/22 1100  cefTRIAXone (ROCEPHIN) 2 g in sodium chloride 0.9 % 100 mL IVPB        2 g 200 mL/hr over 30 Minutes Intravenous  Once 05/23/22 1049 05/23/22 1208   05/23/22 1100  azithromycin (ZITHROMAX) 500 mg in sodium chloride 0.9 % 250 mL IVPB        500 mg 250 mL/hr over 60 Minutes Intravenous  Once 05/23/22 1049 05/23/22 1240   05/23/22 1030  levofloxacin (LEVAQUIN) IVPB 750 mg  Status:  Discontinued        750 mg 100 mL/hr over 90 Minutes Intravenous  Once 05/23/22 1029 05/23/22 1049       Subjective: Patient was seen and examined at bedside.  Overnight events noted.   Patient reports doing much better, she remains on 8 L of oxygen throughout the day. She is intermittently requiring BiPAP.  Objective: Vitals:   05/26/22 0825 05/26/22 0905 05/26/22 1137 05/26/22 1347  BP:  116/89 99/63   Pulse: (!) 102 100 88   Resp:  (!) 24 20   Temp:  97.6 F (36.4 C) (!) 97.4 F (36.3 C)   TempSrc:      SpO2: 92% 100% 100% 91%  Weight:      Height:        Intake/Output Summary (Last 24 hours) at 05/26/2022 1450 Last data filed at 05/26/2022 0411 Gross per 24 hour  Intake 480 ml  Output 1300 ml  Net -820 ml   Filed Weights   05/23/22 1019 05/24/22 0215 05/25/22 0500  Weight: 109.8 kg 106.4 kg 106 kg    Examination:  General exam: Appears comfortable, not in any acute distress, deconditioned. Respiratory system: Decreased breath sounds, normal respiratory effort.  RR 15 Cardiovascular system: S1-S2 heard, regular rate and rhythm, no murmur. Gastrointestinal system: Abdomen is soft, non tender, non distended, BS+ Central nervous system: Alert and oriented x 3. No focal neurological deficits. Extremities: Edema+ . No cyanosis, No clubbing. Skin: No rashes, lesions or ulcers Psychiatry: Judgement and insight appear normal. Mood & affect appropriate.     Data Reviewed: I have personally reviewed following  labs and imaging studies  CBC: Recent Labs  Lab 05/23/22 0947 05/24/22 0302 05/25/22 0652  WBC 9.3 8.0 10.3  NEUTROABS 7.2  --   --   HGB 16.1* 15.2* 15.4*  HCT 47.1* 45.2 46.0  MCV 79.8* 81.9 81.7  PLT 338 313 601   Basic Metabolic Panel: Recent Labs  Lab 05/23/22 0947 05/24/22 0302 05/25/22 0652  NA 138 136 136  K 3.1* 4.8 4.7  CL 105 107 105  CO2 23 20* 19*  GLUCOSE 88 149* 119*  BUN '15 13 16  '$ CREATININE 1.07* 0.88 0.78  CALCIUM 9.3 9.3 9.3  MG 2.2  --  2.6*  PHOS  --   --  4.0   GFR: Estimated Creatinine Clearance: 123 mL/min (by C-G formula based on SCr of 0.78 mg/dL). Liver Function Tests: Recent Labs  Lab 05/23/22 0947  AST 27  ALT 14  ALKPHOS 96  BILITOT 2.6*  PROT 8.7*  ALBUMIN 4.0   No results for input(s): "LIPASE", "  AMYLASE" in the last 168 hours. No results for input(s): "AMMONIA" in the last 168 hours. Coagulation Profile: No results for input(s): "INR", "PROTIME" in the last 168 hours. Cardiac Enzymes: No results for input(s): "CKTOTAL", "CKMB", "CKMBINDEX", "TROPONINI" in the last 168 hours. BNP (last 3 results) No results for input(s): "PROBNP" in the last 8760 hours. HbA1C: Recent Labs    05/24/22 0302  HGBA1C 6.2*   CBG: Recent Labs  Lab 05/24/22 0210  GLUCAP 106*   Lipid Profile: No results for input(s): "CHOL", "HDL", "LDLCALC", "TRIG", "CHOLHDL", "LDLDIRECT" in the last 72 hours.  Thyroid Function Tests: No results for input(s): "TSH", "T4TOTAL", "FREET4", "T3FREE", "THYROIDAB" in the last 72 hours. Anemia Panel: No results for input(s): "VITAMINB12", "FOLATE", "FERRITIN", "TIBC", "IRON", "RETICCTPCT" in the last 72 hours. Sepsis Labs: Recent Labs  Lab 05/23/22 0947 05/23/22 0948 05/23/22 1141  PROCALCITON <0.10  --   --   LATICACIDVEN  --  2.7* 1.8    Recent Results (from the past 240 hour(s))  Resp panel by RT-PCR (RSV, Flu A&B, Covid) Anterior Nasal Swab     Status: Abnormal   Collection Time: 05/23/22  9:47  AM   Specimen: Anterior Nasal Swab  Result Value Ref Range Status   SARS Coronavirus 2 by RT PCR NEGATIVE NEGATIVE Final    Comment: (NOTE) SARS-CoV-2 target nucleic acids are NOT DETECTED.  The SARS-CoV-2 RNA is generally detectable in upper respiratory specimens during the acute phase of infection. The lowest concentration of SARS-CoV-2 viral copies this assay can detect is 138 copies/mL. A negative result does not preclude SARS-Cov-2 infection and should not be used as the sole basis for treatment or other patient management decisions. A negative result may occur with  improper specimen collection/handling, submission of specimen other than nasopharyngeal swab, presence of viral mutation(s) within the areas targeted by this assay, and inadequate number of viral copies(<138 copies/mL). A negative result must be combined with clinical observations, patient history, and epidemiological information. The expected result is Negative.  Fact Sheet for Patients:  EntrepreneurPulse.com.au  Fact Sheet for Healthcare Providers:  IncredibleEmployment.be  This test is no t yet approved or cleared by the [place] FDA and  has been authorized for detection and/or diagnosis of SARS-CoV-2 by FDA under an Emergency Use Authorization (EUA). This EUA will remain  in effect (meaning this test can be used) for the duration of the COVID-19 declaration under Section 564(b)(1) of the Act, 21 U.S.C.section 360bbb-3(b)(1), unless the authorization is terminated  or revoked sooner.       Influenza A by PCR POSITIVE (A) NEGATIVE Final   Influenza B by PCR NEGATIVE NEGATIVE Final    Comment: (NOTE) The Xpert Xpress SARS-CoV-2/FLU/RSV plus assay is intended as an aid in the diagnosis of influenza from Nasopharyngeal swab specimens and should not be used as a sole basis for treatment. Nasal washings and aspirates are unacceptable for Xpert Xpress  SARS-CoV-2/FLU/RSV testing.  Fact Sheet for Patients: EntrepreneurPulse.com.au  Fact Sheet for Healthcare Providers: IncredibleEmployment.be  This test is not yet approved or cleared by the [place] FDA and has been authorized for detection and/or diagnosis of SARS-CoV-2 by FDA under an Emergency Use Authorization (EUA). This EUA will remain in effect (meaning this test can be used) for the duration of the COVID-19 declaration under Section 564(b)(1) of the Act, 21 U.S.C. section 360bbb-3(b)(1), unless the authorization is terminated or revoked.     Resp Syncytial Virus by PCR NEGATIVE NEGATIVE Final    Comment: (NOTE)  Fact Sheet for Patients: EntrepreneurPulse.com.au  Fact Sheet for Healthcare Providers: IncredibleEmployment.be  This test is not yet approved or cleared by the [place] FDA and has been authorized for detection and/or diagnosis of SARS-CoV-2 by FDA under an Emergency Use Authorization (EUA). This EUA will remain in effect (meaning this test can be used) for the duration of the COVID-19 declaration under Section 564(b)(1) of the Act, 21 U.S.C. section 360bbb-3(b)(1), unless the authorization is terminated or revoked.  Performed at [facility], [address] [place], [place] [address]   Culture, blood (routine x 2)     Status: None (Preliminary result)   Collection Time: 05/23/22  9:58 AM   Specimen: BLOOD  Result Value Ref Range Status   Specimen Description BLOOD RIGHT ARM  Final   Special Requests   Final    BOTTLES DRAWN AEROBIC AND ANAEROBIC Blood Culture adequate volume   Culture   Final    NO GROWTH 3 DAYS Performed at [facility], [address] [place], [place] [address]    Report Status PENDING  Incomplete  Culture, blood (routine x 2)     Status: None (Preliminary result)   Collection Time: 05/23/22 11:41 AM   Specimen: BLOOD  Result  Value Ref Range Status   Specimen Description BLOOD LEFT ANTECUBITAL  Final   Special Requests   Final    BOTTLES DRAWN AEROBIC AND ANAEROBIC Blood Culture adequate volume   Culture   Final    NO GROWTH 3 DAYS Performed at [facility], [address] [place], [place] [address]    Report Status PENDING  Incomplete  MRSA Next Gen by PCR, Nasal     Status: None   Collection Time: 05/24/22  2:28 AM   Specimen: Nasal Mucosa; Nasal Swab  Result Value Ref Range Status   MRSA by PCR Next Gen NOT DETECTED NOT DETECTED Final    Comment: (NOTE) The GeneXpert MRSA Assay (FDA approved for NASAL specimens only), is one component of a comprehensive MRSA colonization surveillance program. It is not intended to diagnose MRSA infection nor to guide or monitor treatment for MRSA infections. Test performance is not FDA approved in patients less than 53 years old. Performed at [facility], [address], [place], [place] [address]     Radiology Studies: No results found.  Scheduled Meds:  apixaban  5 mg Oral BID   Chlorhexidine Gluconate Cloth  6 each Topical Daily   famotidine  20 mg Oral BID   influenza vac split quadrivalent PF  0.5 mL Intramuscular Tomorrow-1000   ipratropium-albuterol  3 mL Nebulization Q6H   methylPREDNISolone (SOLU-MEDROL) injection  40 mg Intravenous Daily   oseltamivir  75 mg Oral BID   sildenafil  20 mg Oral TID   spironolactone  25 mg Oral Daily   torsemide  40 mg Oral BID   Treprostinil  18 mcg Inhalation Q4H while awake   Continuous Infusions:   LOS: 3 days    Time spent: 35 mins    Tracie Lindbloom, MD [facility]   If 7PM-7AM, please contact night-coverage

## 2022-05-27 DIAGNOSIS — J9621 Acute and chronic respiratory failure with hypoxia: Secondary | ICD-10-CM | POA: Diagnosis not present

## 2022-05-27 MED ORDER — PREDNISONE 20 MG PO TABS: 20.0000 mg | ORAL_TABLET | Freq: Every day | ORAL | 0 refills | Status: DC

## 2022-05-27 MED ORDER — PREDNISONE 20 MG PO TABS
20.0000 mg | ORAL_TABLET | Freq: Every day | ORAL | 0 refills | Status: DC
  Filled 2022-05-27: qty 2, 2d supply, fill #0

## 2022-05-27 NOTE — Discharge Summary (Signed)
Physician Discharge Summary  Yesenia Carrillo HWE:[id number] DOB: July 14, 1995 DOA: 05/23/2022  PCP: [facility] [place] date: 05/23/2022  Discharge date: 05/27/2022  Admitted From: Home.  Disposition:  Home  Recommendations for Outpatient Follow-up:  Follow up with PCP in 1-2 weeks. Please obtain BMP/CBC in one week. Advised to take prednisone 20 mg daily for 2 more days. Advised to follow-up with Pulmonology as scheduled.  Home Health:None Equipment/Devices:Home oxygen   Discharge Condition: Stable CODE STATUS:Full code Diet recommendation: Heart Healthy  Brief Summary/Hospital Course: This 26 yrs old female with PMH significant of ILD on 8 L oxygen at home, dCHF,  pulmonary hypertension, history of intubation and ECMO, hypertension, GERD, morbid obesity with BMI 44.26, PE  on Eliquis, who presents with shortness breath for one day.  Patient reports chest pain associated with shortness of breath,  fever and chills which resolves over time. Patient was found to have severe respiratory distress, oxygen saturation 80% on 8 L of supplemental oxygen, She was unable to speak full sentences using accessory muscles for breathing, started on BiPAP in the ED.  Patient was found to have influenza positive, COVID-negative,  lactic acid 2.7 BNP 586.  Chest x-ray shows cardiomegaly and bibasilar patchy opacity.  Patient was admitted in the ICU for further evaluation.  She has very high risk for intubation so was admitted in ICU.  She remained stable on 8 L of supplemental oxygen via nasal cannula with intermittent BiPAP at night.  Patient was seen by pulmonology recommended 2D echocardiogram which shows normal LVEF.  Patient was continued on sildenafil and other medications.  Pulmonology signed off , recommended patient should follow-up with outpatient pulmonology.  Patient is being discharged home.  Discharge Diagnoses:  Principal Problem:   Acute on chronic respiratory failure  with hypoxia (HCC) Active Problems:   Influenza A   Severe sepsis (HCC)   ILD (interstitial lung disease) (HCC)   Essential hypertension   Myocardial injury   Hypokalemia   Pulmonary embolism (HCC)   Pulmonary HTN (HCC)   Chronic diastolic CHF (congestive heart failure) (HCC)   Obesity, Class III, BMI 40-49.9 (morbid obesity) ([place])  Acute on chronic hypoxic respiratory failure: Severe sepsis due to influenza A infection: Patient uses 8 L of supplemental oxygen at baseline, now requiring BiPAP. Patient has high risk for deterioration and intubation. Patient meets sepsis criteria (tachycardia, tachypnea, lactic acid 2.7) Patient also reported severe chest pain which is now resolved.. Patient takes Eliquis, low suspicion for new PE. Reduced Solu-Medrol 40 mg IV twice daily. Completed  Tamiflu for 5 days Continue flutter valve, incentive spirometry Continue Mucinex for cough. Continue supplemental oxygen and wean as tolerated. Pulmonology consulted, recommended echocardiogram, started on sildenafil, steroids reduced to 40 mg daily.  Also recommended to have outpatient pulm follow-up. Patient feels better and want to be discharged.   Interstitial lung disease: Continue Solu-Medrol, and bronchodilators.  Continue prednisone 20 mg for 2 more days.   Essential HTN: Continue hydralazine as needed. Continue torsemide and spironolactone.   Elevated troponin : troponin level 20 --> 23,  Chest pain has resolved, currently no chest pain. Continue Eliquis.   Hypokalemia: Replaced.  Continue to monitor.   Hx. Of Pulmonary embolism Continue Eliquis   Pulmonary HTN (HCC) Continue Sildenafil -Tyvaso inhaler   Chronic diastolic CHF: Last echo 89/38/1017 shows LVEF 55 to 60%. Difficult to assess volume status due to morbid obesity. BNP 586, No pulmonary edema on chest x-ray. Continue torsemide 40 mg twice  daily and spironolactone 25 mg twice daily.   Morbid Obesity: Estimated  body mass index is 42.74 kg/m as calculated from the following:   Height as of this encounter: '5\' 2"'$  (1.575 m).   Weight as of this encounter: 106 kg.  Diet and exercise discussed.  Discharge Instructions  Discharge Instructions     Call MD for:  difficulty breathing, headache or visual disturbances   Complete by: As directed    Call MD for:  persistant dizziness or light-headedness   Complete by: As directed    Call MD for:  persistant nausea and vomiting   Complete by: As directed    Diet - low sodium heart healthy   Complete by: As directed    Diet Carb Modified   Complete by: As directed    Discharge instructions   Complete by: As directed    Advised to follow-up with primary care physician in 1 week. Advised to take prednisone 30 mg daily for 2 more days. Advised to follow-up with pulmonology as scheduled.   Increase activity slowly   Complete by: As directed       Allergies as of 05/27/2022       Reactions   Ampicillin Rash, Other (See Comments)   Patient and family cannot remember the specifics of the reaction.   Penicillins Hives        Medication List     STOP taking these medications    amLODipine 10 MG tablet Commonly known as: NORVASC   azithromycin 250 MG tablet Commonly known as: ZITHROMAX   furosemide 40 MG tablet Commonly known as: LASIX       TAKE these medications    albuterol 108 (90 Base) MCG/ACT inhaler Commonly known as: VENTOLIN HFA Inhale into the lungs.   Eliquis 5 MG Tabs tablet Generic drug: apixaban Take 5 mg by mouth 2 (two) times daily.   empagliflozin 10 MG Tabs tablet Commonly known as: JARDIANCE Take 10 mg by mouth daily.   famotidine 20 MG tablet Commonly known as: PEPCID Take 20 mg by mouth 2 (two) times daily.   predniSONE 20 MG tablet Commonly known as: DELTASONE Take 1 tablet (20 mg total) by mouth daily. Notes to patient: Two more doses: 05/28/22 and 05/29/22   sildenafil 20 MG tablet Commonly  known as: REVATIO Take 20 mg by mouth 3 (three) times daily.   spironolactone 25 MG tablet Commonly known as: ALDACTONE Take 25 mg by mouth daily.   torsemide 20 MG tablet Commonly known as: DEMADEX Take 40 mg by mouth 2 (two) times daily.   Tyvaso Refill 0.6 MG/ML Soln Generic drug: Treprostinil Inhale 18 mcg into the lungs every 4 (four) hours while awake.        Follow-up [place], [place] Follow up in 1 week(s).   Specialty: General Practice Contact information: [address] [place] [address] [phone]                Allergies  Allergen Reactions   Ampicillin Rash and Other (See Comments)    Patient and family cannot remember the specifics of the reaction.   Penicillins Hives    Consultations: Pulmonology   Procedures/Studies: ECHOCARDIOGRAM COMPLETE  Result Date: 05/26/2022    ECHOCARDIOGRAM REPORT   Patient Name:   DAJANIQUE ROBLEY Date of Exam: 05/26/2022 Medical Rec #:  [id number]    Height:       62.0 in Accession #:  [id number]   Weight:       233.7 lb Date of Birth:  10/16/1995    BSA:          2.042 m Patient Age:    26 years     BP:           99/63 mmHg Patient Gender: F            HR:           87 bpm. Exam Location:  [facility] Procedure: 2D Echo Indications:     dyspnea  History:         Patient has prior history of Echocardiogram examinations, most                  recent 11/04/2019. Risk Factors:Hypertension.  Sonographer:     Harvie Junior Referring Phys:  [id number] [place] DGAYLI Diagnosing Phys: Ida Rogue MD  Sonographer Comments: Technically difficult study due to poor echo windows and patient is obese. Image acquisition challenging due to patient body habitus. IMPRESSIONS  1. Left ventricular ejection fraction, by estimation, is 55 to 60%. The left ventricle has normal function. The left ventricle has no regional wall motion abnormalities. Left ventricular diastolic parameters were normal. There  is the interventricular septum is flattened in systole and diastole, consistent with right ventricular pressure and volume overload.  2. Right ventricular systolic function is moderately reduced. The right ventricular size is moderately enlarged. There is severely elevated pulmonary artery systolic pressure. The estimated right ventricular systolic pressure is 44.0 mmHg.  3. The mitral valve is normal in structure. Mild mitral valve regurgitation. No evidence of mitral stenosis.  4. The aortic valve is normal in structure. Aortic valve regurgitation is not visualized. No aortic stenosis is present.  5. The inferior vena cava is normal in size with greater than 50% respiratory variability, suggesting right atrial pressure of 3 mmHg. FINDINGS  Left Ventricle: Left ventricular ejection fraction, by estimation, is 55 to 60%. The left ventricle has normal function. The left ventricle has no regional wall motion abnormalities. The left ventricular internal cavity size was normal in size. There is  no left ventricular hypertrophy. The interventricular septum is flattened in systole and diastole, consistent with right ventricular pressure and volume overload. Left ventricular diastolic parameters were normal. Right Ventricle: The right ventricular size is moderately enlarged. No increase in right ventricular wall thickness. Right ventricular systolic function is moderately reduced. There is severely elevated pulmonary artery systolic pressure. The tricuspid regurgitant velocity is 3.99 m/s, and with an assumed right atrial pressure of 3 mmHg, the estimated right ventricular systolic pressure is 10.2 mmHg. Left Atrium: Left atrial size was normal in size. Right Atrium: Right atrial size was normal in size. Pericardium: There is no evidence of pericardial effusion. Mitral Valve: The mitral valve is normal in structure. Mild mitral valve regurgitation. No evidence of mitral valve stenosis. Tricuspid Valve: The tricuspid valve  is normal in structure. Tricuspid valve regurgitation is mild . No evidence of tricuspid stenosis. Aortic Valve: The aortic valve is normal in structure. Aortic valve regurgitation is not visualized. No aortic stenosis is present. Aortic valve mean gradient measures 2.0 mmHg. Aortic valve peak gradient measures 3.0 mmHg. Aortic valve area, by VTI measures 2.60 cm. Pulmonic Valve: The pulmonic valve was normal in structure. Pulmonic valve regurgitation is mild. No evidence of pulmonic stenosis. Aorta: The aortic root is normal in size and structure. Venous: The inferior vena cava is normal in size with greater than  50% respiratory variability, suggesting right atrial pressure of 3 mmHg. IAS/Shunts: No atrial level shunt detected by color flow Doppler.  LEFT VENTRICLE PLAX 2D LVIDd:         3.80 cm     Diastology LVIDs:         2.80 cm     LV e' medial:   5.98 cm/s LV PW:         1.00 cm     LV E/e' medial: 11.8 LV IVS:        1.10 cm LVOT diam:     2.00 cm LV SV:         37 LV SV Index:   18 LVOT Area:     3.14 cm  LV Volumes (MOD) LV vol d, MOD A2C: 67.6 ml LV vol d, MOD A4C: 59.9 ml LV vol s, MOD A2C: 28.7 ml LV vol s, MOD A4C: 23.0 ml LV SV MOD A2C:     38.9 ml LV SV MOD A4C:     59.9 ml LV SV MOD BP:      38.8 ml RIGHT VENTRICLE RV Basal diam:  3.70 cm RV Mid diam:    2.90 cm RV S prime:     9.79 cm/s TAPSE (M-mode): 1.4 cm LEFT ATRIUM             Index        RIGHT ATRIUM           Index LA diam:        2.80 cm 1.37 cm/m   RA Area:     17.70 cm LA Vol (A2C):   35.9 ml 17.58 ml/m  RA Volume:   46.70 ml  22.86 ml/m LA Vol (A4C):   44.3 ml 21.69 ml/m LA Biplane Vol: 42.2 ml 20.66 ml/m  AORTIC VALVE                    PULMONIC VALVE AV Area (Vmax):    2.68 cm     PV Vmax:          0.90 m/s AV Area (Vmean):   2.53 cm     PV Peak grad:     3.2 mmHg AV Area (VTI):     2.60 cm     PR End Diast Vel: 17.47 msec AV Vmax:           86.20 cm/s AV Vmean:          55.400 cm/s AV VTI:            0.144 m AV Peak Grad:       3.0 mmHg AV Mean Grad:      2.0 mmHg LVOT Vmax:         73.60 cm/s LVOT Vmean:        44.700 cm/s LVOT VTI:          0.119 m LVOT/AV VTI ratio: 0.83  AORTA Ao Root diam: 3.40 cm MITRAL VALVE               TRICUSPID VALVE MV Area (PHT): 4.39 cm    TR Peak grad:   63.7 mmHg MV Decel Time: 173 msec    TR Vmax:        399.00 cm/s MR Peak grad: 63.9 mmHg MR Vmax:      399.67 cm/s  SHUNTS MV E velocity: 70.50 cm/s  Systemic VTI:  0.12 m MV A velocity: 58.20 cm/s  Systemic Diam: 2.00 cm MV  E/A ratio:  1.21 Ida Rogue MD Electronically signed by Ida Rogue MD Signature Date/Time: 05/26/2022/5:02:33 PM    Final    DG Chest Port 1 View  Result Date: 05/23/2022 CLINICAL DATA:  Respiratory distress, chronic lung disease, shortness of breath EXAM: PORTABLE CHEST 1 VIEW COMPARISON:  Portable exam 1001 hours compared to 03/18/2022 FINDINGS: Prominent cardiac silhouette again seen. Stable mediastinal contours and pulmonary vascularity. RIGHT basilar atelectasis versus less likely infiltrate. Remaining lungs clear. No pleural effusion or pneumothorax. IMPRESSION: Enlargement of cardiac silhouette with atelectasis versus infiltrate at RIGHT base. Electronically Signed   By: Lavonia Dana M.D.   On: 05/23/2022 10:09   (Echo, Carotid, EGD, Colonoscopy, ERCP)    Subjective: Patient was seen and examined at bedside.  Overnight events noted.   Patient reported doing much better.  Patient wants to be discharged.  Discharge Exam: Vitals:   05/27/22 0817 05/27/22 1117  BP: 103/78 107/68  Pulse: 76 98  Resp: 18 18  Temp: 97.7 F (36.5 C) 97.7 F (36.5 C)  SpO2: 98% 93%   Vitals:   05/27/22 0053 05/27/22 0345 05/27/22 0817 05/27/22 1117  BP:  122/89 103/78 107/68  Pulse:  83 76 98  Resp:  '18 18 18  '$ Temp:  (!) 97.5 F (36.4 C) 97.7 F (36.5 C) 97.7 F (36.5 C)  TempSrc:      SpO2: 92% 98% 98% 93%  Weight:      Height:        General: Pt is alert, awake, not in acute distress Cardiovascular:  RRR, S1/S2 +, no rubs, no gallops Respiratory: CTA bilaterally, no wheezing, no rhonchi Abdominal: Soft, NT, ND, bowel sounds + Extremities: no edema, no cyanosis    The results of significant diagnostics from this hospitalization (including imaging, microbiology, ancillary and laboratory) are listed below for reference.     Microbiology: Recent Results (from the past 240 hour(s))  Resp panel by RT-PCR (RSV, Flu A&B, Covid) Anterior Nasal Swab     Status: Abnormal   Collection Time: 05/23/22  9:47 AM   Specimen: Anterior Nasal Swab  Result Value Ref Range Status   SARS Coronavirus 2 by RT PCR NEGATIVE NEGATIVE Final    Comment: (NOTE) SARS-CoV-2 target nucleic acids are NOT DETECTED.  The SARS-CoV-2 RNA is generally detectable in upper respiratory specimens during the acute phase of infection. The lowest concentration of SARS-CoV-2 viral copies this assay can detect is 138 copies/mL. A negative result does not preclude SARS-Cov-2 infection and should not be used as the sole basis for treatment or other patient management decisions. A negative result may occur with  improper specimen collection/handling, submission of specimen other than nasopharyngeal swab, presence of viral mutation(s) within the areas targeted by this assay, and inadequate number of viral copies(<138 copies/mL). A negative result must be combined with clinical observations, patient history, and epidemiological information. The expected result is Negative.  Fact Sheet for Patients:  EntrepreneurPulse.com.au  Fact Sheet for Healthcare Providers:  IncredibleEmployment.be  This test is no t yet approved or cleared by the [place] FDA and  has been authorized for detection and/or diagnosis of SARS-CoV-2 by FDA under an Emergency Use Authorization (EUA). This EUA will remain  in effect (meaning this test can be used) for the duration of the COVID-19 declaration under  Section 564(b)(1) of the Act, 21 U.S.C.section 360bbb-3(b)(1), unless the authorization is terminated  or revoked sooner.       Influenza A by PCR POSITIVE (A) NEGATIVE Final  Influenza B by PCR NEGATIVE NEGATIVE Final    Comment: (NOTE) The Xpert Xpress SARS-CoV-2/FLU/RSV plus assay is intended as an aid in the diagnosis of influenza from Nasopharyngeal swab specimens and should not be used as a sole basis for treatment. Nasal washings and aspirates are unacceptable for Xpert Xpress SARS-CoV-2/FLU/RSV testing.  Fact Sheet for Patients: EntrepreneurPulse.com.au  Fact Sheet for Healthcare Providers: IncredibleEmployment.be  This test is not yet approved or cleared by the [place] FDA and has been authorized for detection and/or diagnosis of SARS-CoV-2 by FDA under an Emergency Use Authorization (EUA). This EUA will remain in effect (meaning this test can be used) for the duration of the COVID-19 declaration under Section 564(b)(1) of the Act, 21 U.S.C. section 360bbb-3(b)(1), unless the authorization is terminated or revoked.     Resp Syncytial Virus by PCR NEGATIVE NEGATIVE Final    Comment: (NOTE) Fact Sheet for Patients: EntrepreneurPulse.com.au  Fact Sheet for Healthcare Providers: IncredibleEmployment.be  This test is not yet approved or cleared by the [place] FDA and has been authorized for detection and/or diagnosis of SARS-CoV-2 by FDA under an Emergency Use Authorization (EUA). This EUA will remain in effect (meaning this test can be used) for the duration of the COVID-19 declaration under Section 564(b)(1) of the Act, 21 U.S.C. section 360bbb-3(b)(1), unless the authorization is terminated or revoked.  Performed at [facility], [address] [place], [place] [address]   Culture, blood (routine x 2)     Status: None (Preliminary result)   Collection Time: 05/23/22   9:58 AM   Specimen: BLOOD  Result Value Ref Range Status   Specimen Description BLOOD RIGHT ARM  Final   Special Requests   Final    BOTTLES DRAWN AEROBIC AND ANAEROBIC Blood Culture adequate volume   Culture   Final    NO GROWTH 4 DAYS Performed at [facility], [address], [place], [place] [address]    Report Status PENDING  Incomplete  Culture, blood (routine x 2)     Status: None (Preliminary result)   Collection Time: 05/23/22 11:41 AM   Specimen: BLOOD  Result Value Ref Range Status   Specimen Description BLOOD LEFT ANTECUBITAL  Final   Special Requests   Final    BOTTLES DRAWN AEROBIC AND ANAEROBIC Blood Culture adequate volume   Culture   Final    NO GROWTH 4 DAYS Performed at [facility], [address] [place], [place] [address]    Report Status PENDING  Incomplete  MRSA Next Gen by PCR, Nasal     Status: None   Collection Time: 05/24/22  2:28 AM   Specimen: Nasal Mucosa; Nasal Swab  Result Value Ref Range Status   MRSA by PCR Next Gen NOT DETECTED NOT DETECTED Final    Comment: (NOTE) The GeneXpert MRSA Assay (FDA approved for NASAL specimens only), is one component of a comprehensive MRSA colonization surveillance program. It is not intended to diagnose MRSA infection nor to guide or monitor treatment for MRSA infections. Test performance is not FDA approved in patients less than 52 years old. Performed at [facility], [address] [place], [place] [address]      Labs: BNP (last 3 results) Recent Labs    03/18/22 0426 05/23/22 0947  BNP 445.0* 174.0*   Basic Metabolic Panel: Recent Labs  Lab 05/23/22 0947 05/24/22 0302 05/25/22 0652  NA 138 136 136  K 3.1* 4.8 4.7  CL 105 107 105  CO2  23 20* 19*  GLUCOSE 88 149* 119*  BUN '15 13 16  '$ CREATININE 1.07* 0.88 0.78  CALCIUM 9.3 9.3 9.3  MG 2.2  --  2.6*  PHOS  --   --  4.0   Liver Function Tests: Recent Labs  Lab 05/23/22 0947  AST 27  ALT 14  ALKPHOS  96  BILITOT 2.6*  PROT 8.7*  ALBUMIN 4.0   No results for input(s): "LIPASE", "AMYLASE" in the last 168 hours. No results for input(s): "AMMONIA" in the last 168 hours. CBC: Recent Labs  Lab 05/23/22 0947 05/24/22 0302 05/25/22 0652  WBC 9.3 8.0 10.3  NEUTROABS 7.2  --   --   HGB 16.1* 15.2* 15.4*  HCT 47.1* 45.2 46.0  MCV 79.8* 81.9 81.7  PLT 338 313 303   Cardiac Enzymes: No results for input(s): "CKTOTAL", "CKMB", "CKMBINDEX", "TROPONINI" in the last 168 hours. BNP: Invalid input(s): "POCBNP" CBG: Recent Labs  Lab 05/24/22 0210  GLUCAP 106*   D-Dimer No results for input(s): "DDIMER" in the last 72 hours. Hgb A1c No results for input(s): "HGBA1C" in the last 72 hours. Lipid Profile No results for input(s): "CHOL", "HDL", "LDLCALC", "TRIG", "CHOLHDL", "LDLDIRECT" in the last 72 hours. Thyroid function studies No results for input(s): "TSH", "T4TOTAL", "T3FREE", "THYROIDAB" in the last 72 hours.  Invalid input(s): "FREET3" Anemia work up No results for input(s): "VITAMINB12", "FOLATE", "FERRITIN", "TIBC", "IRON", "RETICCTPCT" in the last 72 hours. Urinalysis    Component Value Date/Time   COLORURINE YELLOW (A) 05/23/2022 1022   APPEARANCEUR CLEAR (A) 05/23/2022 1022   APPEARANCEUR Clear 06/06/2013 2001   LABSPEC 1.017 05/23/2022 1022   LABSPEC 1.013 06/06/2013 2001   PHURINE 5.0 05/23/2022 1022   GLUCOSEU >=500 (A) 05/23/2022 1022   GLUCOSEU Negative 06/06/2013 2001   HGBUR LARGE (A) 05/23/2022 1022   BILIRUBINUR NEGATIVE 05/23/2022 1022   BILIRUBINUR Negative 06/06/2013 2001   KETONESUR NEGATIVE 05/23/2022 1022   PROTEINUR NEGATIVE 05/23/2022 1022   NITRITE NEGATIVE 05/23/2022 1022   LEUKOCYTESUR NEGATIVE 05/23/2022 1022   LEUKOCYTESUR Negative 06/06/2013 2001   Sepsis Labs Recent Labs  Lab 05/23/22 0947 05/24/22 0302 05/25/22 0652  WBC 9.3 8.0 10.3   Microbiology Recent Results (from the past 240 hour(s))  Resp panel by RT-PCR (RSV, Flu A&B,  Covid) Anterior Nasal Swab     Status: Abnormal   Collection Time: 05/23/22  9:47 AM   Specimen: Anterior Nasal Swab  Result Value Ref Range Status   SARS Coronavirus 2 by RT PCR NEGATIVE NEGATIVE Final    Comment: (NOTE) SARS-CoV-2 target nucleic acids are NOT DETECTED.  The SARS-CoV-2 RNA is generally detectable in upper respiratory specimens during the acute phase of infection. The lowest concentration of SARS-CoV-2 viral copies this assay can detect is 138 copies/mL. A negative result does not preclude SARS-Cov-2 infection and should not be used as the sole basis for treatment or other patient management decisions. A negative result may occur with  improper specimen collection/handling, submission of specimen other than nasopharyngeal swab, presence of viral mutation(s) within the areas targeted by this assay, and inadequate number of viral copies(<138 copies/mL). A negative result must be combined with clinical observations, patient history, and epidemiological information. The expected result is Negative.  Fact Sheet for Patients:  EntrepreneurPulse.com.au  Fact Sheet for Healthcare Providers:  IncredibleEmployment.be  This test is no t yet approved or cleared by the [place] FDA and  has been authorized for detection and/or diagnosis of SARS-CoV-2 by FDA under an  Emergency Use Authorization (EUA). This EUA will remain  in effect (meaning this test can be used) for the duration of the COVID-19 declaration under Section 564(b)(1) of the Act, 21 U.S.C.section 360bbb-3(b)(1), unless the authorization is terminated  or revoked sooner.       Influenza A by PCR POSITIVE (A) NEGATIVE Final   Influenza B by PCR NEGATIVE NEGATIVE Final    Comment: (NOTE) The Xpert Xpress SARS-CoV-2/FLU/RSV plus assay is intended as an aid in the diagnosis of influenza from Nasopharyngeal swab specimens and should not be used as a sole basis for treatment.  Nasal washings and aspirates are unacceptable for Xpert Xpress SARS-CoV-2/FLU/RSV testing.  Fact Sheet for Patients: EntrepreneurPulse.com.au  Fact Sheet for Healthcare Providers: IncredibleEmployment.be  This test is not yet approved or cleared by the [place] FDA and has been authorized for detection and/or diagnosis of SARS-CoV-2 by FDA under an Emergency Use Authorization (EUA). This EUA will remain in effect (meaning this test can be used) for the duration of the COVID-19 declaration under Section 564(b)(1) of the Act, 21 U.S.C. section 360bbb-3(b)(1), unless the authorization is terminated or revoked.     Resp Syncytial Virus by PCR NEGATIVE NEGATIVE Final    Comment: (NOTE) Fact Sheet for Patients: EntrepreneurPulse.com.au  Fact Sheet for Healthcare Providers: IncredibleEmployment.be  This test is not yet approved or cleared by the [place] FDA and has been authorized for detection and/or diagnosis of SARS-CoV-2 by FDA under an Emergency Use Authorization (EUA). This EUA will remain in effect (meaning this test can be used) for the duration of the COVID-19 declaration under Section 564(b)(1) of the Act, 21 U.S.C. section 360bbb-3(b)(1), unless the authorization is terminated or revoked.  Performed at [facility], [address] [place], [place] [id number]   Culture, blood (routine x 2)     Status: None (Preliminary result)   Collection Time: 05/23/22  9:58 AM   Specimen: BLOOD  Result Value Ref Range Status   Specimen Description BLOOD RIGHT ARM  Final   Special Requests   Final    BOTTLES DRAWN AEROBIC AND ANAEROBIC Blood Culture adequate volume   Culture   Final    NO GROWTH 4 DAYS Performed at [facility], [address] [place], [place] [address]    Report Status PENDING  Incomplete  Culture, blood (routine x 2)     Status: None (Preliminary result)    Collection Time: 05/23/22 11:41 AM   Specimen: BLOOD  Result Value Ref Range Status   Specimen Description BLOOD LEFT ANTECUBITAL  Final   Special Requests   Final    BOTTLES DRAWN AEROBIC AND ANAEROBIC Blood Culture adequate volume   Culture   Final    NO GROWTH 4 DAYS Performed at [facility], [address] [place], [place] [address]    Report Status PENDING  Incomplete  MRSA Next Gen by PCR, Nasal     Status: None   Collection Time: 05/24/22  2:28 AM   Specimen: Nasal Mucosa; Nasal Swab  Result Value Ref Range Status   MRSA by PCR Next Gen NOT DETECTED NOT DETECTED Final    Comment: (NOTE) The GeneXpert MRSA Assay (FDA approved for NASAL specimens only), is one component of a comprehensive MRSA colonization surveillance program. It is not intended to diagnose MRSA infection nor to guide or monitor treatment for MRSA infections. Test performance is not FDA approved in patients less than 60 years old. Performed at [facility], [address] [place], [place] [address]      Time coordinating discharge: Over 30 minutes  SIGNED:   Shawna Clamp, MD  [facility] 05/27/2022, 1:26 PM Pager   If 7PM-7AM, please contact night-coverage

## 2022-05-27 NOTE — Discharge Instructions (Signed)
Advised to follow-up with primary care physician in 1 week. Advised to take prednisone 30 mg daily for 2 more days. Advised to follow-up with pulmonology as scheduled.

## 2022-05-27 NOTE — Progress Notes (Signed)
Keiana L Memon to be D/C'd home per MD order. Discussed with the patient and all questions fully answered.  Skin clean, dry and intact without evidence of skin break down, no evidence of skin tears noted.  IV catheter discontinued intact. Site without signs and symptoms of complications. Dressing and pressure applied.  An After Visit Summary was printed and given to the patient.  Patient escorted via [place], and D/C home via private auto. Family member brought portable O2 tank for transport. Melonie [place]  05/27/2022

## 2022-05-28 LAB — CULTURE, BLOOD (ROUTINE X 2)
Culture: NO GROWTH
Culture: NO GROWTH
Special Requests: ADEQUATE
Special Requests: ADEQUATE

## 2022-05-29 ENCOUNTER — Other Ambulatory Visit: Payer: Self-pay

## 2022-07-08 ENCOUNTER — Ambulatory Visit: Payer: Medicaid Other

## 2022-07-09 ENCOUNTER — Emergency Department: Payer: Medicaid Other

## 2022-07-09 ENCOUNTER — Emergency Department
Admission: EM | Admit: 2022-07-09 | Discharge: 2022-07-09 | Disposition: A | Discharge status: Home / Self Care | Payer: Medicaid Other | Attending: Emergency Medicine | Admitting: Emergency Medicine

## 2022-07-09 ENCOUNTER — Other Ambulatory Visit: Payer: Self-pay

## 2022-07-09 DIAGNOSIS — Z1152 Encounter for screening for COVID-19: Secondary | ICD-10-CM | POA: Diagnosis not present

## 2022-07-09 DIAGNOSIS — J069 Acute upper respiratory infection, unspecified: Secondary | ICD-10-CM | POA: Insufficient documentation

## 2022-07-09 DIAGNOSIS — H66001 Acute suppurative otitis media without spontaneous rupture of ear drum, right ear: Secondary | ICD-10-CM

## 2022-07-09 DIAGNOSIS — H9201 Otalgia, right ear: Secondary | ICD-10-CM | POA: Diagnosis present

## 2022-07-09 LAB — BASIC METABOLIC PANEL
Anion gap: 11 (ref 5–15)
BUN: 10 mg/dL (ref 6–20)
CO2: 24 mmol/L (ref 22–32)
Calcium: 9 mg/dL (ref 8.9–10.3)
Chloride: 100 mmol/L (ref 98–111)
Creatinine, Ser: 0.78 mg/dL (ref 0.44–1.00)
GFR, Estimated: >60 mL/min (ref >60)
Glucose, Bld: 103 mg/dL — ABNORMAL HIGH (ref 70–99)
Potassium: 3 mmol/L — ABNORMAL LOW (ref 3.5–5.1)
Sodium: 135 mmol/L (ref 135–145)

## 2022-07-09 LAB — CBC
HCT: 47.1 % — ABNORMAL HIGH (ref 36.0–46.0)
Hemoglobin: 16.3 g/dL — ABNORMAL HIGH (ref 12.0–15.0)
MCH: 27.8 pg (ref 26.0–34.0)
MCHC: 34.6 g/dL (ref 30.0–36.0)
MCV: 80.2 fL (ref 80.0–100.0)
Platelets: 358 10*3/uL (ref 150–400)
RBC: 5.87 MIL/uL — ABNORMAL HIGH (ref 3.87–5.11)
RDW: 19.4 % — ABNORMAL HIGH (ref 11.5–15.5)
WBC: 14.7 10*3/uL — ABNORMAL HIGH (ref 4.0–10.5)
nRBC: 0.2 % (ref 0.0–0.2)

## 2022-07-09 LAB — RESP PANEL BY RT-PCR (RSV, FLU A&B, COVID)  RVPGX2
Influenza A by PCR: NEGATIVE
Influenza B by PCR: NEGATIVE
Resp Syncytial Virus by PCR: NEGATIVE
SARS Coronavirus 2 by RT PCR: NEGATIVE

## 2022-07-09 LAB — GROUP A STREP BY PCR: Group A Strep by PCR: NOT DETECTED

## 2022-07-09 LAB — TROPONIN I (HIGH SENSITIVITY): Troponin I (High Sensitivity): 19 ng/L — ABNORMAL HIGH (ref <18)

## 2022-07-09 MED ORDER — CEFDINIR 300 MG PO CAPS
300.0000 mg | ORAL_CAPSULE | Freq: Once | ORAL | Status: AC
  Administered 2022-07-09: 300 mg via ORAL
  Filled 2022-07-09: qty 1

## 2022-07-09 MED ORDER — CEFDINIR 300 MG PO CAPS: 300.0000 mg | ORAL_CAPSULE | Freq: Two times a day (BID) | ORAL | 0 refills | Status: AC

## 2022-07-09 MED ORDER — KETOROLAC TROMETHAMINE 30 MG/ML IJ SOLN
30.0000 mg | Freq: Once | INTRAMUSCULAR | Status: AC
  Administered 2022-07-09: 30 mg via INTRAMUSCULAR
  Filled 2022-07-09: qty 1

## 2022-07-09 NOTE — ED Provider Notes (Signed)
[facility] Provider Note    Event Date/Time   First MD Initiated Contact with Patient 07/09/22 1823     (approximate)   History   Cough and Otalgia   HPI  Yesenia Carrillo is a 27 y.o. female with a history of interstitial lung disease, pulmonary hypertension, PE who presents with complaints of right ear pain over the last 2 days.  She also complains of some fatigue and an episode of nausea, now improved.  Her primary complaint is right ear pain.  She she wears 8 L nasal cannula at all times, she reports her breathing is at baseline     Physical Exam   Triage Vital Signs: ED Triage Vitals  Enc Vitals Group     BP 07/09/22 1721 96/80     Pulse Rate 07/09/22 1721 (!) 108     Resp 07/09/22 1721 20     Temp 07/09/22 1721 98.3 F (36.8 C)     Temp Source 07/09/22 1721 Oral     SpO2 07/09/22 1721 91 %     Weight 07/09/22 1721 111.1 kg (245 lb)     Height 07/09/22 1721 1.575 m ('5\' 2"'$ )     Head Circumference --      Peak Flow --      Pain Score 07/09/22 1903 0     Pain Loc --      Pain Edu? --      Excl. in [place]? --     Most recent vital signs: Vitals:   07/09/22 1830 07/09/22 1903  BP: (!) 139/91 (!) 139/91  Pulse: (!) 113 100  Resp: 19 20  Temp:  98.6 F (37 C)  SpO2: 93% 98%     General: Awake, no distress.  CV:  Good peripheral perfusion.  Resp:  Normal effort.  Abd:  No distention.  Other:  Right ear: Swollen bulging TM, no clear perforation   ED Results / Procedures / Treatments   Labs (all labs ordered are listed, but only abnormal results are displayed) Labs Reviewed  BASIC METABOLIC PANEL - Abnormal; Notable for the following components:      Result Value   Potassium 3.0 (*)    Glucose, Bld 103 (*)    All other components within normal limits  CBC - Abnormal; Notable for the following components:   WBC 14.7 (*)    RBC 5.87 (*)    Hemoglobin 16.3 (*)    HCT 47.1 (*)    RDW 19.4 (*)    All other components within normal  limits  TROPONIN I (HIGH SENSITIVITY) - Abnormal; Notable for the following components:   Troponin I (High Sensitivity) 19 (*)    All other components within normal limits  GROUP A STREP BY PCR  RESP PANEL BY RT-PCR (RSV, FLU A&B, COVID)  RVPGX2  POC URINE PREG, ED     EKG     RADIOLOGY X-ray viewed interpret by me, no acute abnormality    PROCEDURES:  Critical Care performed:   Procedures   MEDICATIONS ORDERED IN ED: Medications  cefdinir (OMNICEF) capsule 300 mg (300 mg Oral Given 07/09/22 1902)  ketorolac (TORADOL) 30 MG/ML injection 30 mg (30 mg Intramuscular Given 07/09/22 1902)     IMPRESSION / MDM / ASSESSMENT AND PLAN / ED COURSE  I reviewed the triage vital signs and the nursing notes. Patient's presentation is most consistent with acute complicated illness / injury requiring diagnostic workup.  Patient presents with symptoms as above,  primary complaint is right ear pain.  Given her significant history of lung disease, chest x-ray, labs obtained as well  Lab work reviewed and is overall reassuring, elevated white blood cell count is nonspecific, patient reports her breathing is at baseline and her chest x-ray is reassuring.  Her high sensitive troponin is mildly elevated but is chronically elevated  Exam is most consistent with otitis media, patient has allergy to amoxicillin, will give cefdinir here, prescription provided, close outpatient follow-up, return precautions discussed        FINAL CLINICAL IMPRESSION(S) / ED DIAGNOSES   Final diagnoses:  Acute suppurative otitis media of right ear without spontaneous rupture of tympanic membrane, recurrence not specified  Upper respiratory tract infection, unspecified type     Rx / DC Orders   ED Discharge Orders          Ordered    cefdinir (OMNICEF) 300 MG capsule  2 times daily        07/09/22 1853             Note:  This document was prepared using Dragon voice recognition software and may  include unintentional dictation errors.   Lavonia Drafts, MD 07/09/22 [phone]

## 2022-07-09 NOTE — ED Triage Notes (Signed)
Pt via POV from home. Pt c/o R sided otalgia, productive cough, nausea, and possible fever. Denies sick contacts. Pt is A&Ox4 and NAD  Pt wears 8L [place] chronically for lung disease

## 2022-08-09 ENCOUNTER — Emergency Department: Payer: Medicaid Other

## 2022-08-09 ENCOUNTER — Encounter: Payer: Self-pay | Admitting: Internal Medicine

## 2022-08-09 ENCOUNTER — Other Ambulatory Visit: Payer: Self-pay

## 2022-08-09 ENCOUNTER — Observation Stay
Admission: EM | Admit: 2022-08-09 | Discharge: 2022-08-10 | Disposition: A | Discharge status: Home / Self Care | Payer: Medicaid Other | Attending: Student | Admitting: Student

## 2022-08-09 DIAGNOSIS — J849 Interstitial pulmonary disease, unspecified: Principal | ICD-10-CM | POA: Diagnosis present

## 2022-08-09 DIAGNOSIS — I2699 Other pulmonary embolism without acute cor pulmonale: Secondary | ICD-10-CM | POA: Diagnosis not present

## 2022-08-09 DIAGNOSIS — I110 Hypertensive heart disease with heart failure: Secondary | ICD-10-CM | POA: Insufficient documentation

## 2022-08-09 DIAGNOSIS — I5032 Chronic diastolic (congestive) heart failure: Secondary | ICD-10-CM | POA: Diagnosis not present

## 2022-08-09 DIAGNOSIS — J9600 Acute respiratory failure, unspecified whether with hypoxia or hypercapnia: Principal | ICD-10-CM

## 2022-08-09 DIAGNOSIS — Z87891 Personal history of nicotine dependence: Secondary | ICD-10-CM | POA: Insufficient documentation

## 2022-08-09 DIAGNOSIS — J9621 Acute and chronic respiratory failure with hypoxia: Secondary | ICD-10-CM | POA: Diagnosis present

## 2022-08-09 DIAGNOSIS — R55 Syncope and collapse: Secondary | ICD-10-CM | POA: Diagnosis present

## 2022-08-09 DIAGNOSIS — D751 Secondary polycythemia: Secondary | ICD-10-CM | POA: Insufficient documentation

## 2022-08-09 DIAGNOSIS — Z8616 Personal history of COVID-19: Secondary | ICD-10-CM | POA: Diagnosis not present

## 2022-08-09 DIAGNOSIS — I272 Pulmonary hypertension, unspecified: Secondary | ICD-10-CM | POA: Diagnosis not present

## 2022-08-09 DIAGNOSIS — E66813 Obesity, class 3: Secondary | ICD-10-CM | POA: Diagnosis present

## 2022-08-09 DIAGNOSIS — Z6841 Body Mass Index (BMI) 40.0 and over, adult: Secondary | ICD-10-CM | POA: Insufficient documentation

## 2022-08-09 DIAGNOSIS — Z7984 Long term (current) use of oral hypoglycemic drugs: Secondary | ICD-10-CM | POA: Insufficient documentation

## 2022-08-09 DIAGNOSIS — Z7901 Long term (current) use of anticoagulants: Secondary | ICD-10-CM | POA: Insufficient documentation

## 2022-08-09 DIAGNOSIS — E162 Hypoglycemia, unspecified: Secondary | ICD-10-CM | POA: Diagnosis present

## 2022-08-09 DIAGNOSIS — Z20822 Contact with and (suspected) exposure to covid-19: Secondary | ICD-10-CM | POA: Insufficient documentation

## 2022-08-09 DIAGNOSIS — E876 Hypokalemia: Secondary | ICD-10-CM | POA: Diagnosis present

## 2022-08-09 DIAGNOSIS — I10 Essential (primary) hypertension: Secondary | ICD-10-CM

## 2022-08-09 DIAGNOSIS — I5A Non-ischemic myocardial injury (non-traumatic): Secondary | ICD-10-CM | POA: Diagnosis present

## 2022-08-09 DIAGNOSIS — I2489 Other forms of acute ischemic heart disease: Secondary | ICD-10-CM | POA: Insufficient documentation

## 2022-08-09 DIAGNOSIS — R0603 Acute respiratory distress: Secondary | ICD-10-CM | POA: Diagnosis present

## 2022-08-09 DIAGNOSIS — R7989 Other specified abnormal findings of blood chemistry: Secondary | ICD-10-CM | POA: Diagnosis present

## 2022-08-09 LAB — URINE DRUG SCREEN, QUALITATIVE (ARMC ONLY)
Amphetamines, Ur Screen: NOT DETECTED
Barbiturates, Ur Screen: NOT DETECTED
Benzodiazepine, Ur Scrn: NOT DETECTED
Cannabinoid 50 Ng, Ur ~~LOC~~: NOT DETECTED
Cocaine Metabolite,Ur ~~LOC~~: NOT DETECTED
MDMA (Ecstasy)Ur Screen: NOT DETECTED
Methadone Scn, Ur: NOT DETECTED
Opiate, Ur Screen: NOT DETECTED
Phencyclidine (PCP) Ur S: NOT DETECTED
Tricyclic, Ur Screen: NOT DETECTED

## 2022-08-09 LAB — COMPREHENSIVE METABOLIC PANEL
ALT: 15 U/L (ref 0–44)
AST: 31 U/L (ref 15–41)
Albumin: 3.9 g/dL (ref 3.5–5.0)
Alkaline Phosphatase: 134 U/L — ABNORMAL HIGH (ref 38–126)
Anion gap: 11 (ref 5–15)
BUN: 12 mg/dL (ref 6–20)
CO2: 25 mmol/L (ref 22–32)
Calcium: 9 mg/dL (ref 8.9–10.3)
Chloride: 102 mmol/L (ref 98–111)
Creatinine, Ser: 1.1 mg/dL — ABNORMAL HIGH (ref 0.44–1.00)
GFR, Estimated: >60 mL/min (ref >60)
Glucose, Bld: 57 mg/dL — ABNORMAL LOW (ref 70–99)
Potassium: 3 mmol/L — ABNORMAL LOW (ref 3.5–5.1)
Sodium: 138 mmol/L (ref 135–145)
Total Bilirubin: 3.1 mg/dL — ABNORMAL HIGH (ref 0.3–1.2)
Total Protein: 8.1 g/dL (ref 6.5–8.1)

## 2022-08-09 LAB — TROPONIN I (HIGH SENSITIVITY)
Troponin I (High Sensitivity): 64 ng/L — ABNORMAL HIGH (ref <18)
Troponin I (High Sensitivity): 88 ng/L — ABNORMAL HIGH (ref <18)
Troponin I (High Sensitivity): 95 ng/L — ABNORMAL HIGH (ref <18)
Troponin I (High Sensitivity): 61 ng/L — ABNORMAL HIGH (ref <18)
Troponin I (High Sensitivity): 53 ng/L — ABNORMAL HIGH (ref <18)

## 2022-08-09 LAB — CBC WITH DIFFERENTIAL/PLATELET
Abs Immature Granulocytes: 0.11 10*3/uL — ABNORMAL HIGH (ref 0.00–0.07)
Basophils Absolute: 0.1 10*3/uL (ref 0.0–0.1)
Basophils Relative: 0 %
Eosinophils Absolute: 0.1 10*3/uL (ref 0.0–0.5)
Eosinophils Relative: 0 %
HCT: 50.1 % — ABNORMAL HIGH (ref 36.0–46.0)
Hemoglobin: 17.2 g/dL — ABNORMAL HIGH (ref 12.0–15.0)
Immature Granulocytes: 1 %
Lymphocytes Relative: 12 %
Lymphs Abs: 2.3 10*3/uL (ref 0.7–4.0)
MCH: 27.3 pg (ref 26.0–34.0)
MCHC: 34.3 g/dL (ref 30.0–36.0)
MCV: 79.4 fL — ABNORMAL LOW (ref 80.0–100.0)
Monocytes Absolute: 0.8 10*3/uL (ref 0.1–1.0)
Monocytes Relative: 4 %
Neutro Abs: 15.5 10*3/uL — ABNORMAL HIGH (ref 1.7–7.7)
Neutrophils Relative %: 83 %
Platelets: 326 10*3/uL (ref 150–400)
RBC: 6.31 MIL/uL — ABNORMAL HIGH (ref 3.87–5.11)
RDW: 19.6 % — ABNORMAL HIGH (ref 11.5–15.5)
WBC: 18.9 10*3/uL — ABNORMAL HIGH (ref 4.0–10.5)
nRBC: 0.1 % (ref 0.0–0.2)

## 2022-08-09 LAB — GLUCOSE, CAPILLARY
Glucose-Capillary: 129 mg/dL — ABNORMAL HIGH (ref 70–99)
Glucose-Capillary: 173 mg/dL — ABNORMAL HIGH (ref 70–99)
Glucose-Capillary: 218 mg/dL — ABNORMAL HIGH (ref 70–99)

## 2022-08-09 LAB — MAGNESIUM: Magnesium: 2.3 mg/dL (ref 1.7–2.4)

## 2022-08-09 LAB — RESP PANEL BY RT-PCR (RSV, FLU A&B, COVID)  RVPGX2
Influenza A by PCR: NEGATIVE
Influenza B by PCR: NEGATIVE
Resp Syncytial Virus by PCR: NEGATIVE
SARS Coronavirus 2 by RT PCR: NEGATIVE

## 2022-08-09 LAB — PHOSPHORUS: Phosphorus: 2.7 mg/dL (ref 2.5–4.6)

## 2022-08-09 LAB — BRAIN NATRIURETIC PEPTIDE: B Natriuretic Peptide: 251.2 pg/mL — ABNORMAL HIGH (ref 0.0–100.0)

## 2022-08-09 LAB — CBG MONITORING, ED: Glucose-Capillary: 186 mg/dL — ABNORMAL HIGH (ref 70–99)

## 2022-08-09 LAB — PREGNANCY, URINE: Preg Test, Ur: NEGATIVE

## 2022-08-09 MED ORDER — POTASSIUM CHLORIDE CRYS ER 20 MEQ PO TBCR
40.0000 meq | EXTENDED_RELEASE_TABLET | Freq: Once | ORAL | Status: AC
  Administered 2022-08-09: 40 meq via ORAL
  Filled 2022-08-09: qty 2

## 2022-08-09 MED ORDER — ONDANSETRON HCL 4 MG/2ML IJ SOLN
4.0000 mg | Freq: Once | INTRAMUSCULAR | Status: AC
  Administered 2022-08-09: 4 mg via INTRAVENOUS
  Filled 2022-08-09: qty 2

## 2022-08-09 MED ORDER — HYDRALAZINE HCL 20 MG/ML IJ SOLN: 5.0000 mg | INTRAMUSCULAR | Status: DC | PRN

## 2022-08-09 MED ORDER — EMPAGLIFLOZIN 10 MG PO TABS
10.0000 mg | ORAL_TABLET | Freq: Every day | ORAL | Status: DC
  Administered 2022-08-09 – 2022-08-10 (×2): 10 mg via ORAL
  Filled 2022-08-09 (×2): qty 1

## 2022-08-09 MED ORDER — IPRATROPIUM-ALBUTEROL 0.5-2.5 (3) MG/3ML IN SOLN
3.0000 mL | Freq: Once | RESPIRATORY_TRACT | Status: AC
  Administered 2022-08-09: 3 mL via RESPIRATORY_TRACT
  Filled 2022-08-09: qty 3

## 2022-08-09 MED ORDER — TORSEMIDE 20 MG PO TABS
40.0000 mg | ORAL_TABLET | Freq: Two times a day (BID) | ORAL | Status: DC
  Administered 2022-08-09 – 2022-08-10 (×3): 40 mg via ORAL
  Filled 2022-08-09 (×3): qty 2

## 2022-08-09 MED ORDER — IPRATROPIUM-ALBUTEROL 0.5-2.5 (3) MG/3ML IN SOLN
3.0000 mL | RESPIRATORY_TRACT | Status: DC
  Administered 2022-08-09 (×3): 3 mL via RESPIRATORY_TRACT
  Filled 2022-08-09 (×3): qty 3

## 2022-08-09 MED ORDER — DM-GUAIFENESIN ER 30-600 MG PO TB12: 1.0000 | ORAL_TABLET | Freq: Two times a day (BID) | ORAL | Status: DC | PRN

## 2022-08-09 MED ORDER — TREPROSTINIL 0.6 MG/ML IN SOLN: 18.0000 ug | RESPIRATORY_TRACT | Status: DC

## 2022-08-09 MED ORDER — IPRATROPIUM-ALBUTEROL 0.5-2.5 (3) MG/3ML IN SOLN
RESPIRATORY_TRACT | Status: AC
  Filled 2022-08-09: qty 3

## 2022-08-09 MED ORDER — METHYLPREDNISOLONE SODIUM SUCC 125 MG IJ SOLR
125.0000 mg | Freq: Once | INTRAMUSCULAR | Status: AC
  Administered 2022-08-09: 125 mg via INTRAVENOUS
  Filled 2022-08-09: qty 2

## 2022-08-09 MED ORDER — IPRATROPIUM-ALBUTEROL 0.5-2.5 (3) MG/3ML IN SOLN
3.0000 mL | Freq: Three times a day (TID) | RESPIRATORY_TRACT | Status: DC
  Administered 2022-08-09 – 2022-08-10 (×2): 3 mL via RESPIRATORY_TRACT
  Filled 2022-08-09 (×2): qty 3

## 2022-08-09 MED ORDER — SPIRONOLACTONE 25 MG PO TABS
25.0000 mg | ORAL_TABLET | Freq: Every day | ORAL | Status: DC
  Administered 2022-08-09 – 2022-08-10 (×2): 25 mg via ORAL
  Filled 2022-08-09 (×2): qty 1

## 2022-08-09 MED ORDER — APIXABAN 5 MG PO TABS
5.0000 mg | ORAL_TABLET | Freq: Two times a day (BID) | ORAL | Status: DC
  Administered 2022-08-09 – 2022-08-10 (×3): 5 mg via ORAL
  Filled 2022-08-09 (×3): qty 1

## 2022-08-09 MED ORDER — DEXTROSE 50 % IV SOLN: 50.0000 mL | INTRAVENOUS | Status: DC | PRN

## 2022-08-09 MED ORDER — SILDENAFIL CITRATE 20 MG PO TABS
20.0000 mg | ORAL_TABLET | Freq: Three times a day (TID) | ORAL | Status: DC
  Administered 2022-08-09 – 2022-08-10 (×4): 20 mg via ORAL
  Filled 2022-08-09 (×6): qty 1

## 2022-08-09 MED ORDER — ATORVASTATIN CALCIUM 20 MG PO TABS
40.0000 mg | ORAL_TABLET | Freq: Every day | ORAL | Status: DC
  Administered 2022-08-09 – 2022-08-10 (×2): 40 mg via ORAL
  Filled 2022-08-09 (×2): qty 2

## 2022-08-09 MED ORDER — ALBUTEROL SULFATE (2.5 MG/3ML) 0.083% IN NEBU: 2.5000 mg | INHALATION_SOLUTION | RESPIRATORY_TRACT | Status: DC | PRN

## 2022-08-09 MED ORDER — ASPIRIN 81 MG PO CHEW
324.0000 mg | CHEWABLE_TABLET | Freq: Once | ORAL | Status: AC
  Administered 2022-08-09: 324 mg via ORAL
  Filled 2022-08-09: qty 4

## 2022-08-09 MED ORDER — ACETAMINOPHEN 325 MG PO TABS
650.0000 mg | ORAL_TABLET | Freq: Once | ORAL | Status: AC
  Administered 2022-08-09: 650 mg via ORAL
  Filled 2022-08-09: qty 2

## 2022-08-09 MED ORDER — ACETAMINOPHEN 325 MG PO TABS: 650.0000 mg | ORAL_TABLET | Freq: Four times a day (QID) | ORAL | Status: DC | PRN

## 2022-08-09 MED ORDER — IOHEXOL 350 MG/ML SOLN
75.0000 mL | Freq: Once | INTRAVENOUS | Status: AC | PRN
  Administered 2022-08-09: 75 mL via INTRAVENOUS

## 2022-08-09 MED ORDER — ASPIRIN 81 MG PO TBEC
81.0000 mg | DELAYED_RELEASE_TABLET | Freq: Every day | ORAL | Status: DC
  Administered 2022-08-10: 81 mg via ORAL
  Filled 2022-08-09: qty 1

## 2022-08-09 MED ORDER — DIPHENHYDRAMINE HCL 50 MG/ML IJ SOLN: 12.5000 mg | Freq: Three times a day (TID) | INTRAMUSCULAR | Status: DC | PRN

## 2022-08-09 NOTE — ED Provider Notes (Signed)
[facility] Provider Note    Event Date/Time   First MD Initiated Contact with Patient 08/09/22 0404     (approximate)   History   Respiratory Distress   HPI  Level V caveat: Limited by distress  Yesenia Carrillo is a 27 y.o. female brought to the ED via EMS from home with a chief complaint of respiratory distress.  Patient with a history of ILD, CHF, pulmonary hypertension, PE on Eliquis who is usually on 8 L nasal cannula oxygen.  States she was adding water to her concentrator and was unable to feel any oxygen.  Subsequently she had a syncopal episode when going to the restroom and struck her head.  Endorses LOC.  Endorses cough.  Denies fever/chills, chest pain, abdominal pain, nausea, vomiting or dizziness.     Past Medical History   Past Medical History:  Diagnosis Date   COVID-19 04/2019   patient reports diagnosis in Nov 2020   Hypertension    Interstitial lung disease ([place])    Morbid obesity with BMI of 40.0-44.9, adult [facility])    Pre-eclampsia      Active Problem List   Patient Active Problem List   Diagnosis Date Noted   Influenza A 05/23/2022   Acute on chronic respiratory failure with hypoxia ([place]) 05/23/2022   Obesity, Class III, BMI 40-49.9 (morbid obesity) ([place]) 05/23/2022   Severe sepsis ([place]) 05/23/2022   ILD (interstitial lung disease) ([place]) 05/23/2022   Myocardial injury 05/23/2022   Hypokalemia 05/23/2022   Chronic diastolic CHF (congestive heart failure) ([place]) 05/23/2022   Pulmonary HTN ([place]) 05/23/2022   Pulmonary embolism ([place]) 05/23/2022   BV (bacterial vaginosis) 03/12/2020   HSV (herpes simplex virus) anogenital infection 03/12/2020   Thrombocytosis 11/13/2019   Hx pulmonary embolism 09/10/2019   Hypotension (arterial) 09/10/2019   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   Essential hypertension 05/10/2019   Pneumonia due to COVID-19 virus 05/06/2019   COVID-19 05/04/2019   Pregnancy 05/04/2019   Acute cystitis  with hematuria 05/04/2019   Sepsis due to gram-negative urinary tract infection ([place]) 11/07/2018   Sepsis ([place]) 11/05/2018   Morbid obesity with BMI of 50.0-59.9, adult ([place]) 05/14/2018   Acute respiratory failure with hypoxia ([place])    CAP (community acquired pneumonia) 04/30/2018     Past Surgical History   Past Surgical History:  Procedure Laterality Date   TONSILLECTOMY     WISDOM TOOTH EXTRACTION       Home Medications   Prior to Admission medications   Medication Sig Start Date End Date Taking? Authorizing Provider  albuterol (VENTOLIN HFA) 108 (90 Base) MCG/ACT inhaler Inhale into the lungs. 07/25/21   [provider]  ELIQUIS 5 MG TABS tablet Take 5 mg by mouth 2 (two) times daily.    [provider]  empagliflozin (JARDIANCE) 10 MG TABS tablet Take 10 mg by mouth daily. 10/19/20   [provider]  famotidine (PEPCID) 20 MG tablet Take 20 mg by mouth 2 (two) times daily. 08/29/21 08/29/22  [provider]  predniSONE (DELTASONE) 20 MG tablet Take 1 tablet (20 mg total) by mouth daily. 05/27/22   Duard Brady, MD  sildenafil (REVATIO) 20 MG tablet Take 20 mg by mouth 3 (three) times daily. 04/19/22   [provider]  spironolactone (ALDACTONE) 25 MG tablet Take 25 mg by mouth daily.    [provider]  torsemide (DEMADEX) 20 MG tablet Take 40 mg by mouth 2 (two) times daily. 05/10/22 05/10/23  [provider]  TYVASO REFILL 0.6 MG/ML SOLN Inhale 18 mcg into the lungs every 4 (four) hours while awake. 11/30/21   [provider]     Allergies  Ampicillin and Penicillins   Family History   Family History  Problem Relation Age of Onset   Healthy Mother    Healthy Father      Physical Exam  Triage Vital Signs: ED Triage Vitals  Enc Vitals Group     BP      Pulse      Resp      Temp      Temp src      SpO2      Weight      Height      Head Circumference      Peak Flow      Pain Score       Pain Loc      Pain Edu?      Excl. in [place]?     Updated Vital Signs: BP 104/69 (BP Location: Left Arm)   Pulse (!) 114   Temp 97.9 F (36.6 C) (Oral)   Resp (!) 23   Ht '5\' 1"'$  (1.549 m)   Wt 108.9 kg   SpO2 (!) 83%   BMI 45.35 kg/m    General: Awake, moderate distress.  CV:  Tachycardic.  Good peripheral perfusion.  Resp:  Increased effort.  Diminished aeration. Abd:  Obese, nontender.  No distention.  Other:  Head is atraumatic.  PERRL.  EOMI.  Nose is atraumatic.  No dental malocclusion.  No midline cervical spine tenderness to palpation.  ED Results / Procedures / Treatments  Labs (all labs ordered are listed, but only abnormal results are displayed) Labs Reviewed  CBC WITH DIFFERENTIAL/PLATELET - Abnormal; Notable for the following components:      Result Value   WBC 18.9 (*)    RBC 6.31 (*)    Hemoglobin 17.2 (*)    HCT 50.1 (*)    MCV 79.4 (*)    RDW 19.6 (*)    Neutro Abs 15.5 (*)    Abs Immature Granulocytes 0.11 (*)    All other components within normal limits  COMPREHENSIVE METABOLIC PANEL - Abnormal; Notable for the following components:   Potassium 3.0 (*)    Glucose, Bld 57 (*)    Creatinine, Ser 1.10 (*)    Alkaline Phosphatase 134 (*)    Total Bilirubin 3.1 (*)    All other components within normal limits  BRAIN NATRIURETIC PEPTIDE - Abnormal; Notable for the following components:   B Natriuretic Peptide 251.2 (*)    All other components within normal limits  TROPONIN I (HIGH SENSITIVITY) - Abnormal; Notable for the following components:   Troponin I (High Sensitivity) 64 (*)    All other components within normal limits  RESP PANEL BY RT-PCR (RSV, FLU A&B, COVID)  RVPGX2  TROPONIN I (HIGH SENSITIVITY)     EKG  ED ECG REPORT I, Zyara Riling J, the attending physician, personally viewed and interpreted this ECG.   Date: 08/09/2022  EKG Time: 0519  Rate: 106  Rhythm: sinus tachycardia  Axis: RAD  Intervals: QTC 685  ST&T Change:  Nonspecific  QTc 663 on EKG dated 07/10/2022  RADIOLOGY I have independently visualized and interpreted patient's x-ray as well as noted the radiology interpretation:  X-ray: Stable cardiomegaly  CT head: No ICH  Official radiology report(s): DG Chest Port 1 View  Result Date: 08/09/2022 CLINICAL DATA:  27 year old female  status post syncope. On home oxygen. Shortness of breath. Test for COVID-19 pending EXAM: PORTABLE CHEST 1 VIEW COMPARISON:  Chest radiographs 07/09/2022 and earlier. FINDINGS: Portable AP upright view at 0420 hours. Moderate cardiomegaly appears stable along with other mediastinal contours. Visualized tracheal air column is within normal limits. Lung volumes are stable and within normal limits. Allowing for portable technique the lungs are clear. No pneumothorax, pulmonary edema, pleural effusion. Paucity of bowel gas the visible abdomen. No osseous abnormality identified. IMPRESSION: Stable cardiomegaly. No acute cardiopulmonary abnormality. Electronically Signed   By: Genevie Ann M.D.   On: 08/09/2022 04:41   CT Head Wo Contrast  Result Date: 08/09/2022 CLINICAL DATA:  27 year old female status post syncope. On home oxygen. Shortness of breath. EXAM: CT HEAD WITHOUT CONTRAST TECHNIQUE: Contiguous axial images were obtained from the base of the skull through the vertex without intravenous contrast. RADIATION DOSE REDUCTION: This exam was performed according to the departmental dose-optimization program which includes automated exposure control, adjustment of the mA and/or kV according to patient size and/or use of iterative reconstruction technique. COMPARISON:  None Available. FINDINGS: Brain: Normal cerebral volume. No midline shift, ventriculomegaly, mass effect, evidence of mass lesion, intracranial hemorrhage or evidence of cortically based acute infarction. Gray-white matter differentiation is within normal limits throughout the brain. Vascular: No suspicious intracranial vascular  hyperdensity. Skull: Negative. Sinuses/Orbits: Mild right mastoid effusion. Right tympanic cavity is clear. Left tympanic cavity and mastoids are clear. Paranasal sinuses are well aerated. Other: Adenoid hypertrophy, nonspecific and might be physiologic in this age group. Visualized orbits and scalp soft tissues are within normal limits. IMPRESSION: 1. Normal noncontrast CT appearance of the brain. 2. Adenoid hypertrophy with mild right mastoid effusion. These might be inflammatory (URI) or postinflammatory. Right middle ear and paranasal sinuses are well aerated. Electronically Signed   By: Genevie Ann M.D.   On: 08/09/2022 04:38     PROCEDURES:  Critical Care performed: Yes, see critical care procedure note(s)  CRITICAL CARE Performed by: Paulette Blanch   Total critical care time: 45 minutes  Critical care time was exclusive of separately billable procedures and treating other patients.  Critical care was necessary to treat or prevent imminent or life-threatening deterioration.  Critical care was time spent personally by me on the following activities: development of treatment plan with patient and/or surrogate as well as nursing, discussions with consultants, evaluation of patient's response to treatment, examination of patient, obtaining history from patient or surrogate, ordering and performing treatments and interventions, ordering and review of laboratory studies, ordering and review of radiographic studies, pulse oximetry and re-evaluation of patient's condition.   Marland Kitchen1-3 Lead EKG Interpretation  Performed by: Paulette Blanch, MD Authorized by: Paulette Blanch, MD     Interpretation: abnormal     ECG rate:  114   ECG rate assessment: tachycardic     Rhythm: sinus tachycardia     Ectopy: none     Conduction: normal   Comments:     Patient placed on cardiac monitor to evaluate for arrhythmias    MEDICATIONS ORDERED IN ED: Medications  potassium chloride SA (KLOR-CON M) CR tablet 40 mEq  (has no administration in time range)  ipratropium-albuterol (DUONEB) 0.5-2.5 (3) MG/3ML nebulizer solution 3 mL (has no administration in time range)  ipratropium-albuterol (DUONEB) 0.5-2.5 (3) MG/3ML nebulizer solution 3 mL (has no administration in time range)  ipratropium-albuterol (DUONEB) 0.5-2.5 (3) MG/3ML nebulizer solution 3 mL (0 mLs Nebulization Return to [facility] 08/09/22 0527)  methylPREDNISolone sodium  succinate (SOLU-MEDROL) 125 mg/2 mL injection 125 mg (125 mg Intravenous Given 08/09/22 0459)  ondansetron (ZOFRAN) injection 4 mg (4 mg Intravenous Given 08/09/22 0459)  acetaminophen (TYLENOL) tablet 650 mg (650 mg Oral Given 08/09/22 0505)     IMPRESSION / MDM / ASSESSMENT AND PLAN / ED COURSE  I reviewed the triage vital signs and the nursing notes.                             27 year old female presenting in respiratory distress. Differential includes, but is not limited to, viral syndrome, bronchitis including COPD exacerbation, pneumonia, reactive airway disease including asthma, CHF including exacerbation with or without pulmonary/interstitial edema, pneumothorax, ACS, thoracic trauma, and pulmonary embolism.  I have personally reviewed patient's records and note her last hospitalization on 05/23/2022 for acute on chronic respiratory failure and influenza A.  Patient's presentation is most consistent with acute presentation with potential threat to life or bodily function.  The patient is on the cardiac monitor to evaluate for evidence of arrhythmia and/or significant heart rate changes.  Patient placed on nonrebreather oxygen as saturations on her 8 L nasal cannula were 83%.  Will obtain cardiac and respiratory panels, CT head, chest x-ray.  Administer 125 mg IV Solu-Medrol and DuoNeb.  Anticipate hospitalization.  Clinical Course as of 08/09/22 [id number]  Wed Aug 09, 2022  0448 RT evaluated patient at bedside and placed her on 13 L heated high flow oxygen [JS]  0604 Will administer  second DuoNeb for continued decreased aeration.  Elevated troponin likely secondary to demand ischemia.  Replete potassium orally.  Will consult hospital services for evaluation and admission. [JS]    Clinical Course User Index [JS] Paulette Blanch, MD     FINAL CLINICAL IMPRESSION(S) / ED DIAGNOSES   Final diagnoses:  Acute respiratory failure, unspecified whether with hypoxia or hypercapnia (HCC)  ILD (interstitial lung disease) ([place])  Elevated troponin  Hypokalemia     Rx / DC Orders   ED Discharge Orders     None        Note:  This document was prepared using Dragon voice recognition software and may include unintentional dictation errors.   Paulette Blanch, MD 08/09/22 [phone]

## 2022-08-09 NOTE — ED Triage Notes (Signed)
Brought in via [employer]film[employer]video for sob. Patient reports she was adding water to concentrator and was unable to feel any oxygen, she had a syncopal episode when going to the bathroom. She hit her head, loc, and is on blood thinner. 8 liters [place] at home

## 2022-08-09 NOTE — Consult Note (Signed)
[facility] Consultation      Date: 08/09/2022,   MRN# [id number] Yesenia Carrillo 06-09-1995   SYNOPSIS 60F, hx of ILD on 8 L oxygen at home, dCHF, PE, pulmonary hypertension, history of intubation and ECMO, hypertension, GERD, morbid obesity, PE  on Eliquis, presents with SOB and syncope. CTA negative for PE. CT-head negative. Initially required 11 L of HFNC, now on 9 L Oxygen. Negative PCR for COVID, flu and RSV. BNP 251   CHIEF COMPLAINT:   27 y.o. female brought to the ED via EMS from home with a chief complaint of respiratory distress. Patient with a history of ILD, CHF, pulmonary hypertension, PE on Eliquis who is usually on 8 L nasal cannula oxygen.    States she was adding water to her concentrator and was unable to feel any oxygen.   Subsequently she had a syncopal episode when going to the restroom and struck her head.  Endorses LOC.  Endorses cough.   Denies fever/chills, chest pain, abdominal pain, nausea, vomiting or dizziness.   CT CHEST REVIEWED IN DETAIL  Patient feels back to baseline Alert and awake  Admission in Dec 2023 for INFL A infection    PULMONARY HISTORY The patient is followed closely at [facility], where she is seen by cardiology and pulmonary for HFpEF and the diagnosis of pulmonary hypertension. She was last seen there in October of 2023.    Review of the medical record is notable for the following: She has had multiple hospitalizations dating back to 2019 when she developed ARDS requiring EMCO (complicated by retroperitoneal bleed and a provoked PE for which she received 3 months of Eliquis). She again developed ARDS requiring intubation in June of 2020. She was then hospitalized in November of 2020 secondary to [place] (while pregnant -  treated with remdesivir and steroids). She was intubated in April of 2021 secondary to ARDS due to rhinovirus infection. She was hospitalized May to June of 2021 for hypoxic and hypercapnic respiratory failure  requiring intubation. At that point, imaging was notable for extensive mediastinal lymphadenopathy (EBUS with acute inflammation and few histiocytes). Imaging through out was also concerning for an interstitial parenchymal process. She was re-admitted February through March of 2022 (while [redacted] weeks pregnant) with concern for preeclampsia. At that time, TTE was notable for elevated RV systolic pressure up to 50. Pulmonary hypertension evaluation was initiated (VQ scan with wedge-shaped defects in the RUL/RLL; RHC with mean PA 43, PCWP 22, PVR 4 wood consistent with pre and post capillary hypertension). The pulmonary hypertension was attributed to group II and III at that point. The most recent right heart cath was from November 24, 2021 and showed the following: RA 12/10, RV 90/5, PA 90/40 (56), PCWP 10, FICK CO 3.12, Fick CI 1.54, thermal CO 3.07, thermal CI 1.51, PVR 15 wood units.   At home, she is on 8 liters of nasal cannula. She is managed with inhaled triprostinil and sildenafil for her pulmonary hypertension. She is to have a sleep study ordered by her outpatient pulmonologist. Further workup of her pulmonary hypertension included a VQ scan but her outpatient team did not suspect CTEPH as a cause behind her symptoms. She was also seen for concern of ILD with a broad differential (NSIP, HSP, COP) with discussions about possible surgical biopsy but this was deemed too high risk (50% intra/post op mortality). She's also followed by cardiology (last seen October of 2023) where she was given IV lasix during the clinic visit. She  was switched to torsemide 40 mg PO bid (with extra 20 mg PO bid PRN) and continued on Spironolactone (25 mg daily) and Empagliflozin (10 mg daily). She is also maintained on Apixaban 5 mg bid.   Social: smoked marijuana 2015-2019. Used to be exposed to pet birds and mold (no more). Has one daughter.   Pertinent  Medical History  -HFpEF -Pulmonary Hypertension (Groups 2 and 3) -History  of provoked PE -history of splenic vein thrombosis and infarcts -recurrent episodes of respiratory failure -sickle cell train -morbid obesity    PAST MEDICAL HISTORY   Past Medical History:  Diagnosis Date   COVID-19 04/2019   patient reports diagnosis in Nov 2020   Hypertension    Interstitial lung disease ([place])    Morbid obesity with BMI of 40.0-44.9, adult ([place])    Pre-eclampsia      SURGICAL HISTORY   Past Surgical History:  Procedure Laterality Date   TONSILLECTOMY     WISDOM TOOTH EXTRACTION       FAMILY HISTORY   Family History  Problem Relation Age of Onset   Healthy Mother    Healthy Father      SOCIAL HISTORY   Social History   Tobacco Use   Smoking status: Former    Years: 2.00    Types: Cigarettes    Quit date: 12/05/2015    Years since quitting: 6.6   Smokeless tobacco: Never   Tobacco comments:    2 cig/day  Vaping Use   Vaping Use: Never used  Substance Use Topics   Alcohol use: Not Currently   Drug use: No     MEDICATIONS    Home Medication:  Current Outpatient Rx   Order #: [id number] Class: Historical Med   Order #: [id number] Class: Historical Med   Order #: [id number] Class: Historical Med   Order #: [id number] Class: Historical Med   Order #: [id number] Class: Historical Med   Order #: [id number] Class: Historical Med   Order #: [id number] Class: Historical Med   Order #: [id number] Class: Historical Med   Order #: [id number] Class: Historical Med   Order #: [id number] Class: Normal    Current Medication:  Current Facility-Administered Medications:    acetaminophen (TYLENOL) tablet 650 mg, 650 mg, Oral, Q6H PRN, Ivor Costa, MD   albuterol (PROVENTIL) (2.5 MG/3ML) 0.083% nebulizer solution 2.5 mg, 2.5 mg, Nebulization, Q4H PRN, Ivor Costa, MD   apixaban (ELIQUIS) tablet 5 mg, 5 mg, Oral, BID, Ivor Costa, MD   [START ON 08/10/2022] aspirin EC tablet 81 mg, 81 mg, Oral, Daily, Ivor Costa, MD   atorvastatin (LIPITOR) tablet 40 mg, 40 mg,  Oral, Daily, Ivor Costa, MD   dextromethorphan-guaiFENesin ([place] DM) 30-600 MG per 12 hr tablet 1 tablet, 1 tablet, Oral, BID PRN, Ivor Costa, MD   dextrose 50 % solution 50 mL, 50 mL, Intravenous, PRN, Ivor Costa, MD   diphenhydrAMINE (BENADRYL) injection 12.5 mg, 12.5 mg, Intravenous, Q8H PRN, Ivor Costa, MD   empagliflozin (JARDIANCE) tablet 10 mg, 10 mg, Oral, Daily, Ivor Costa, MD   hydrALAZINE (APRESOLINE) injection 5 mg, 5 mg, Intravenous, Q2H PRN, Ivor Costa, MD   ipratropium-albuterol (DUONEB) 0.5-2.5 (3) MG/3ML nebulizer solution 3 mL, 3 mL, Nebulization, Q4H, Ivor Costa, MD, 3 mL at 08/09/22 0851   sildenafil (REVATIO) tablet 20 mg, 20 mg, Oral, TID, Ivor Costa, MD   spironolactone (ALDACTONE) tablet 25 mg, 25 mg, Oral, Daily, Ivor Costa, MD   torsemide (DEMADEX) tablet 40 mg, 40 mg, Oral, BID, Ivor Costa, MD   Treprostinil (TYVASO) inhalation  solution 18 mcg, 18 mcg, Inhalation, Q4H while awake, Ivor Costa, MD  Current Outpatient Medications:    albuterol (VENTOLIN HFA) 108 (90 Base) MCG/ACT inhaler, Inhale 1-2 puffs into the lungs every 6 (six) hours as needed for wheezing or shortness of breath., Disp: , Rfl:    ELIQUIS 5 MG TABS tablet, Take 5 mg by mouth 2 (two) times daily., Disp: , Rfl:    empagliflozin (JARDIANCE) 10 MG TABS tablet, Take 10 mg by mouth daily., Disp: , Rfl:    famotidine (PEPCID) 20 MG tablet, Take 20 mg by mouth 2 (two) times daily., Disp: , Rfl:    Fluticasone-Umeclidin-Vilant (TRELEGY ELLIPTA) 200-62.5-25 MCG/ACT AEPB, Inhale 1 puff into the lungs daily., Disp: , Rfl:    sildenafil (REVATIO) 20 MG tablet, Take 20 mg by mouth 3 (three) times daily., Disp: , Rfl:    spironolactone (ALDACTONE) 25 MG tablet, Take 25 mg by mouth daily., Disp: , Rfl:    torsemide (DEMADEX) 20 MG tablet, Take 40 mg by mouth 2 (two) times daily., Disp: , Rfl:    TYVASO REFILL 0.6 MG/ML SOLN, Inhale 18 mcg into the lungs every 4 (four) hours while awake., Disp: , Rfl:     predniSONE (DELTASONE) 20 MG tablet, Take 1 tablet (20 mg total) by mouth daily. (Patient not taking: Reported on 08/09/2022), Disp: 2 tablet, Rfl: 0    ALLERGIES   Ampicillin and Penicillins     REVIEW OF SYSTEMS    Review of Systems:  Gen:  Denies  fever, sweats, chills weigh loss  HEENT: Denies blurred vision, double vision, ear pain, eye pain, hearing loss, nose bleeds, sore throat Cardiac:  No dizziness, chest pain or heaviness, chest tightness,edema Resp:   +shortness of breath,-wheezing,- hemoptysis,  Gi: Denies swallowing difficulty, stomach pain, nausea or vomiting, diarrhea, constipation, bowel incontinence Gu:  Denies bladder incontinence, burning urine Ext:   Denies Joint pain, stiffness or swelling Skin: Denies  skin rash, easy bruising or bleeding or hives Endoc:  Denies polyuria, polydipsia , polyphagia or weight change Psych:   Denies depression, insomnia or hallucinations   Other:  All other systems negative   VS: BP 93/61   Pulse (!) 110   Temp 97.9 F (36.6 C) (Oral)   Resp 18   Ht '5\' 1"'$  (1.549 m)   Wt 108.9 kg   SpO2 96%   BMI 45.35 kg/m      PHYSICAL EXAM  General Appearance: No distress  EYES PERRLA, EOM intact.   NECK Supple, No JVD Pulmonary: normal breath sounds, +crackles CardiovascularNormal S1,S2.  No m/r/g.   Abdomen: Benign, Soft, non-tender. Skin:   warm, no rashes, no ecchymosis  Extremities: normal, no cyanosis, clubbing. Neuro:without focal findings,  speech normal  PSYCHIATRIC: Mood, affect within normal limits.   ALL OTHER ROS ARE NEGATIVE      IMAGING    CT Angio Chest PE W/Cm &/Or Wo Cm  Result Date: 08/09/2022 CLINICAL DATA:  27 year old female with history of chest pain and shortness of breath. EXAM: CT ANGIOGRAPHY CHEST WITH CONTRAST TECHNIQUE: Multidetector CT imaging of the chest was performed using the standard protocol during bolus administration of intravenous contrast. Multiplanar CT image reconstructions  and MIPs were obtained to evaluate the vascular anatomy. RADIATION DOSE REDUCTION: This exam was performed according to the departmental dose-optimization program which includes automated exposure control, adjustment of the mA and/or kV according to patient size and/or use of iterative reconstruction technique. CONTRAST:  36m OMNIPAQUE IOHEXOL 350 MG/ML SOLN COMPARISON:  Chest CTA 10/29/2019. FINDINGS: Cardiovascular: No filling defects within the pulmonary arterial tree to suggest pulmonary embolism. Heart size is normal. There is no significant pericardial fluid, thickening or pericardial calcification. Dilatation of the pulmonic trunk (3.6 cm in diameter). No atherosclerotic calcifications are noted in the thoracic aorta or the coronary arteries. Mediastinum/Nodes: Multiple prominent borderline enlarged mediastinal lymph nodes, less pronounced than prior examination from [id number], of uncertain etiology and significance, but presumably benign. Esophagus is unremarkable in appearance. Lungs/Pleura: Diffuse ground-glass attenuation noted throughout both lungs. No confluent consolidative airspace disease. No pleural effusions. No suspicious appearing pulmonary nodules or masses are noted. Upper Abdomen: Unremarkable. Musculoskeletal: There are no aggressive appearing lytic or blastic lesions noted in the visualized portions of the skeleton. Review of the MIP images confirms the above findings. IMPRESSION: 1. No evidence of pulmonary embolism. 2. Diffuse ground-glass attenuation in both lungs. Multiple prominent borderline enlarged mediastinal and bilateral hilar lymph nodes, nonspecific, but less pronounced than prior examination and presumably benign. Clinical correlation for systemic disease, including HIV, is recommended, as this could explain both findings with chronic lymphadenopathy and ground-glass attenuation in the lungs which could reflect acute infection such as pneumocystis. Alternatively, the findings  in the lungs could reflect interstitial lung disease. Referral to Pulmonology for further clinical evaluation should also be considered. 3. Dilatation of the pulmonic trunk (3.6 cm in diameter), concerning for pulmonary arterial hypertension. Electronically Signed   By: Vinnie Langton M.D.   On: 08/09/2022 07:46   DG Chest Port 1 View  Result Date: 08/09/2022 CLINICAL DATA:  27 year old female status post syncope. On home oxygen. Shortness of breath. Test for COVID-19 pending EXAM: PORTABLE CHEST 1 VIEW COMPARISON:  Chest radiographs 07/09/2022 and earlier. FINDINGS: Portable AP upright view at 0420 hours. Moderate cardiomegaly appears stable along with other mediastinal contours. Visualized tracheal air column is within normal limits. Lung volumes are stable and within normal limits. Allowing for portable technique the lungs are clear. No pneumothorax, pulmonary edema, pleural effusion. Paucity of bowel gas the visible abdomen. No osseous abnormality identified. IMPRESSION: Stable cardiomegaly. No acute cardiopulmonary abnormality. Electronically Signed   By: Genevie Ann M.D.   On: 08/09/2022 04:41   CT Head Wo Contrast  Result Date: 08/09/2022 CLINICAL DATA:  27 year old female status post syncope. On home oxygen. Shortness of breath. EXAM: CT HEAD WITHOUT CONTRAST TECHNIQUE: Contiguous axial images were obtained from the base of the skull through the vertex without intravenous contrast. RADIATION DOSE REDUCTION: This exam was performed according to the departmental dose-optimization program which includes automated exposure control, adjustment of the mA and/or kV according to patient size and/or use of iterative reconstruction technique. COMPARISON:  None Available. FINDINGS: Brain: Normal cerebral volume. No midline shift, ventriculomegaly, mass effect, evidence of mass lesion, intracranial hemorrhage or evidence of cortically based acute infarction. Gray-white matter differentiation is within normal limits  throughout the brain. Vascular: No suspicious intracranial vascular hyperdensity. Skull: Negative. Sinuses/Orbits: Mild right mastoid effusion. Right tympanic cavity is clear. Left tympanic cavity and mastoids are clear. Paranasal sinuses are well aerated. Other: Adenoid hypertrophy, nonspecific and might be physiologic in this age group. Visualized orbits and scalp soft tissues are within normal limits. IMPRESSION: 1. Normal noncontrast CT appearance of the brain. 2. Adenoid hypertrophy with mild right mastoid effusion. These might be inflammatory (URI) or postinflammatory. Right middle ear and paranasal sinuses are well aerated. Electronically Signed   By: Genevie Ann M.D.   On: 08/09/2022 04:38      ASSESSMENT/PLAN  44F, hx of ILD on 8 L oxygen at home, dCHF, PE, pulmonary hypertension, history of intubation and ECMO, hypertension, GERD, morbid obesity, PE  on Eliquis, presents with SOB and syncope. CTA negative for PE. CT-head negative. Initially required 11 L of HFNC, now on 9 L Oxygen. Negative PCR for COVID, flu and RSV. BNP 251   END STAGE LUNG DISEASE WITH PULM HTN Continue oxygen as prescribed Continue Oral and Inhaled therapy for PULM HTN No need for prednisone or ABX therapy Recommend TOC assessment   FOLLOW UP PULMONARY AT [facility]   CURRENT MEDICATIONS REVIEWED AT LENGTH WITH PATIENT TODAY      Corrin Parker, M.D.  [facility]  Medical Director [place] Director [facility] Cardio-Pulmonary Department

## 2022-08-09 NOTE — H&P (Signed)
History and Physical    Yesenia Carrillo [id number] DOB: [birth year] DOA: 08/09/2022  Referring MD/NP/PA:   [facility]P: [facility] [place]   Patient coming from:  The patient is coming from home.    Chief Complaint: SOB and syncope  HPI: Yesenia Carrillo is a 27 y.o. female with medical history significant of  ILD on 8-9 L oxygen at home, dCHF, PE, pulmonary hypertension, history of intubation and ECMO, hypertension, GERD, morbid obesity, PE  on Eliquis, presents with SOB and syncope.   Pt states that she has worsening shortness of breath today.  She has dry cough, no chest pain, fever or chills. She states that passed out when going to the restroom and struck her head.  Endorses LOC.  No unilateral numbness or tinglings in extremities.  No facial droop or slurred speech.  Patient does not have nausea, vomiting, diarrhea or abdominal pain.  Denies symptoms of UTI.  She is taking her Eliquis consistently.  Pt initially has severe respiratory distress, cannot speak in full sentence, started on 11 L of HFNC. Her respiratory status has improved.  Oxygen titrated down to 9 L oxygen when I saw in ED.   Data reviewed independently and ED Course: pt was found to have WBC 18.9, GFR> 60, potassium 3.0, negative PCR for COVID, flu and RSV. BNP 251, trop  64--> 88. CTA negative for PE. CT-head negative for acute intracranial issues.. CXR negative. Pt is admit to PCU as inpt. Consulted Dr. Mortimer Fries of pulm.   EKG: I have personally reviewed.  Sinus rhythm, QTc 685, bilateral atrial enlargement, early R wave progression, RAD.   Review of Systems:   General: no fevers, chills, no body weight gain,  has fatigue HEENT: no blurry vision, hearing changes or sore throat Respiratory: has dyspnea, coughing,  no wheezing CV: no chest pain, no palpitations GI: no nausea, vomiting, abdominal pain, diarrhea, constipation GU: no dysuria, burning on urination, increased urinary frequency, hematuria  Ext: has   trace leg edema Neuro: no unilateral weakness, numbness, or tingling, no vision change or hearing loss. Has syncope Skin: no rash, no skin tear. MSK: No muscle spasm, no deformity, no limitation of range of movement in spin Heme: No easy bruising.  Travel history: No recent long distant travel.   Allergy:  Allergies  Allergen Reactions   Ampicillin Rash and Other (See Comments)    Patient and family cannot remember the specifics of the reaction.   Penicillins Hives    Past Medical History:  Diagnosis Date   COVID-19 04/2019   patient reports diagnosis in Nov 2020   Hypertension    Interstitial lung disease ([place])    Morbid obesity with BMI of 40.0-44.9, adult ([place])    Pre-eclampsia     Past Surgical History:  Procedure Laterality Date   TONSILLECTOMY     WISDOM TOOTH EXTRACTION      Social History:  reports that she quit smoking about 6 years ago. Her smoking use included cigarettes. She has never used smokeless tobacco. She reports that she does not currently use alcohol. She reports that she does not use drugs.  Family History:  Family History  Problem Relation Age of Onset   Healthy Mother    Healthy Father      Prior to Admission medications   Medication Sig Start Date End Date Taking? Authorizing Provider  albuterol (VENTOLIN HFA) 108 (90 Base) MCG/ACT inhaler Inhale into the lungs. 07/25/21   [provider]  Arne Cleveland  5 MG TABS tablet Take 5 mg by mouth 2 (two) times daily.    [provider]  empagliflozin (JARDIANCE) 10 MG TABS tablet Take 10 mg by mouth daily. 10/19/20   [provider]  famotidine (PEPCID) 20 MG tablet Take 20 mg by mouth 2 (two) times daily. 08/29/21 08/29/22  [provider]  predniSONE (DELTASONE) 20 MG tablet Take 1 tablet (20 mg total) by mouth daily. 05/27/22   Duard Brady, MD  sildenafil (REVATIO) 20 MG tablet Take 20 mg by mouth 3 (three) times daily. 04/19/22   [provider]   spironolactone (ALDACTONE) 25 MG tablet Take 25 mg by mouth daily.    [provider]  torsemide (DEMADEX) 20 MG tablet Take 40 mg by mouth 2 (two) times daily. 05/10/22 05/10/23  [provider]  TYVASO REFILL 0.6 MG/ML SOLN Inhale 18 mcg into the lungs every 4 (four) hours while awake. 11/30/21   [provider]    Physical Exam: Vitals:   08/09/22 1350 08/09/22 1354 08/09/22 1609 08/09/22 1623  BP: 105/76  (!) 120/92   Pulse: 95 92 (!) 101   Resp: 20 (!) 24 16   Temp:   98.5 F (36.9 C)   TempSrc:      SpO2: 100% 92% 96% 90%  Weight:      Height:       General: Not in acute distress HEENT:       Eyes: PERRL, EOMI, no scleral icterus.       ENT: No discharge from the ears and nose, no pharynx injection, no tonsillar enlargement.        Neck: No JVD, no bruit, no mass felt. Heme: No neck lymph node enlargement. Cardiac: S1/S2, RRR, No murmurs, No gallops or rubs. Respiratory: Has decreased air movement bilaterally GI: Soft, nondistended, nontender, no rebound pain, no organomegaly, BS present. GU: No hematuria Ext: has trace leg edema bilaterally. 1+DP/PT pulse bilaterally. Musculoskeletal: No joint deformities, No joint redness or warmth, no limitation of ROM in spin. Skin: No rashes.  Neuro: Alert, oriented X3, cranial nerves II-XII grossly intact, moves all extremities normally. Muscle strength 5/5 in all extremities, sensation to light touch intact.  Psych: Patient is not psychotic, no suicidal or hemocidal ideation.  Labs on Admission: I have personally reviewed following labs and imaging studies  CBC: Recent Labs  Lab 08/09/22 0501  WBC 18.9*  NEUTROABS 15.5*  HGB 17.2*  HCT 50.1*  MCV 79.4*  PLT [id number]   Basic Metabolic Panel: Recent Labs  Lab 08/09/22 0501  NA 138  K 3.0*  CL 102  CO2 25  GLUCOSE 57*  BUN 12  CREATININE 1.10*  CALCIUM 9.0   GFR: Estimated Creatinine Clearance: 89.1 mL/min (A) (by C-G formula based on SCr of  1.1 mg/dL (H)). Liver Function Tests: Recent Labs  Lab 08/09/22 0501  AST 31  ALT 15  ALKPHOS 134*  BILITOT 3.1*  PROT 8.1  ALBUMIN 3.9   No results for input(s): "LIPASE", "AMYLASE" in the last 168 hours. No results for input(s): "AMMONIA" in the last 168 hours. Coagulation Profile: No results for input(s): "INR", "PROTIME" in the last 168 hours. Cardiac Enzymes: No results for input(s): "CKTOTAL", "CKMB", "CKMBINDEX", "TROPONINI" in the last 168 hours. BNP (last 3 results) No results for input(s): "PROBNP" in the last 8760 hours. HbA1C: No results for input(s): "HGBA1C" in the last 72 hours. CBG: Recent Labs  Lab 08/09/22 1006 08/09/22 1609  GLUCAP 186* 173*  Lipid Profile: No results for input(s): "CHOL", "HDL", "LDLCALC", "TRIG", "CHOLHDL", "LDLDIRECT" in the last 72 hours. Thyroid Function Tests: No results for input(s): "TSH", "T4TOTAL", "FREET4", "T3FREE", "THYROIDAB" in the last 72 hours. Anemia Panel: No results for input(s): "VITAMINB12", "FOLATE", "FERRITIN", "TIBC", "IRON", "RETICCTPCT" in the last 72 hours. Urine analysis:    Component Value Date/Time   COLORURINE YELLOW (A) 05/23/2022 1022   APPEARANCEUR CLEAR (A) 05/23/2022 1022   APPEARANCEUR Clear 06/06/2013 2001   LABSPEC 1.017 05/23/2022 1022   LABSPEC 1.013 06/06/2013 2001   PHURINE 5.0 05/23/2022 1022   GLUCOSEU >=500 (A) 05/23/2022 1022   GLUCOSEU Negative 06/06/2013 2001   HGBUR LARGE (A) 05/23/2022 1022   BILIRUBINUR NEGATIVE 05/23/2022 1022   BILIRUBINUR Negative 06/06/2013 2001   KETONESUR NEGATIVE 05/23/2022 1022   PROTEINUR NEGATIVE 05/23/2022 1022   NITRITE NEGATIVE 05/23/2022 1022   LEUKOCYTESUR NEGATIVE 05/23/2022 1022   LEUKOCYTESUR Negative 06/06/2013 2001   Sepsis Labs: '@LABRCNTIP'$ (procalcitonin:4,lacticidven:4) ) Recent Results (from the past 240 hour(s))  Resp panel by RT-PCR (RSV, Flu A&B, Covid) Anterior Nasal Swab     Status: None   Collection Time: 08/09/22  5:01 AM    Specimen: Anterior Nasal Swab  Result Value Ref Range Status   SARS Coronavirus 2 by RT PCR NEGATIVE NEGATIVE Final    Comment: (NOTE) SARS-CoV-2 target nucleic acids are NOT DETECTED.  The SARS-CoV-2 RNA is generally detectable in upper respiratory specimens during the acute phase of infection. The lowest concentration of SARS-CoV-2 viral copies this assay can detect is 138 copies/mL. A negative result does not preclude SARS-Cov-2 infection and should not be used as the sole basis for treatment or other patient management decisions. A negative result may occur with  improper specimen collection/handling, submission of specimen other than nasopharyngeal swab, presence of viral mutation(s) within the areas targeted by this assay, and inadequate number of viral copies(<138 copies/mL). A negative result must be combined with clinical observations, patient history, and epidemiological information. The expected result is Negative.  Fact Sheet for Patients:  EntrepreneurPulse.com.au  Fact Sheet for Healthcare Providers:  IncredibleEmployment.be  This test is no t yet approved or cleared by the [place] FDA and  has been authorized for detection and/or diagnosis of SARS-CoV-2 by FDA under an Emergency Use Authorization (EUA). This EUA will remain  in effect (meaning this test can be used) for the duration of the COVID-19 declaration under Section 564(b)(1) of the Act, 21 U.S.C.section 360bbb-3(b)(1), unless the authorization is terminated  or revoked sooner.       Influenza A by PCR NEGATIVE NEGATIVE Final   Influenza B by PCR NEGATIVE NEGATIVE Final    Comment: (NOTE) The Xpert Xpress SARS-CoV-2/FLU/RSV plus assay is intended as an aid in the diagnosis of influenza from Nasopharyngeal swab specimens and should not be used as a sole basis for treatment. Nasal washings and aspirates are unacceptable for Xpert Xpress  SARS-CoV-2/FLU/RSV testing.  Fact Sheet for Patients: EntrepreneurPulse.com.au  Fact Sheet for Healthcare Providers: IncredibleEmployment.be  This test is not yet approved or cleared by the [place] FDA and has been authorized for detection and/or diagnosis of SARS-CoV-2 by FDA under an Emergency Use Authorization (EUA). This EUA will remain in effect (meaning this test can be used) for the duration of the COVID-19 declaration under Section 564(b)(1) of the Act, 21 U.S.C. section 360bbb-3(b)(1), unless the authorization is terminated or revoked.     Resp Syncytial Virus by PCR NEGATIVE NEGATIVE Final    Comment: (NOTE) Fact Sheet  for Patients: EntrepreneurPulse.com.au  Fact Sheet for Healthcare Providers: IncredibleEmployment.be  This test is not yet approved or cleared by the [place] FDA and has been authorized for detection and/or diagnosis of SARS-CoV-2 by FDA under an Emergency Use Authorization (EUA). This EUA will remain in effect (meaning this test can be used) for the duration of the COVID-19 declaration under Section 564(b)(1) of the Act, 21 U.S.C. section 360bbb-3(b)(1), unless the authorization is terminated or revoked.  Performed at [facility], [address] [place], [place] [address]      Radiological Exams on Admission: CT Angio Chest PE W/Cm &/Or Wo Cm  Result Date: 08/09/2022 CLINICAL DATA:  27 year old female with history of chest pain and shortness of breath. EXAM: CT ANGIOGRAPHY CHEST WITH CONTRAST TECHNIQUE: Multidetector CT imaging of the chest was performed using the standard protocol during bolus administration of intravenous contrast. Multiplanar CT image reconstructions and MIPs were obtained to evaluate the vascular anatomy. RADIATION DOSE REDUCTION: This exam was performed according to the departmental dose-optimization program which includes automated  exposure control, adjustment of the mA and/or kV according to patient size and/or use of iterative reconstruction technique. CONTRAST:  17m OMNIPAQUE IOHEXOL 350 MG/ML SOLN COMPARISON:  Chest CTA 10/29/2019. FINDINGS: Cardiovascular: No filling defects within the pulmonary arterial tree to suggest pulmonary embolism. Heart size is normal. There is no significant pericardial fluid, thickening or pericardial calcification. Dilatation of the pulmonic trunk (3.6 cm in diameter). No atherosclerotic calcifications are noted in the thoracic aorta or the coronary arteries. Mediastinum/Nodes: Multiple prominent borderline enlarged mediastinal lymph nodes, less pronounced than prior examination from [id number] of uncertain etiology and significance, but presumably benign. Esophagus is unremarkable in appearance. Lungs/Pleura: Diffuse ground-glass attenuation noted throughout both lungs. No confluent consolidative airspace disease. No pleural effusions. No suspicious appearing pulmonary nodules or masses are noted. Upper Abdomen: Unremarkable. Musculoskeletal: There are no aggressive appearing lytic or blastic lesions noted in the visualized portions of the skeleton. Review of the MIP images confirms the above findings. IMPRESSION: 1. No evidence of pulmonary embolism. 2. Diffuse ground-glass attenuation in both lungs. Multiple prominent borderline enlarged mediastinal and bilateral hilar lymph nodes, nonspecific, but less pronounced than prior examination and presumably benign. Clinical correlation for systemic disease, including HIV, is recommended, as this could explain both findings with chronic lymphadenopathy and ground-glass attenuation in the lungs which could reflect acute infection such as pneumocystis. Alternatively, the findings in the lungs could reflect interstitial lung disease. Referral to Pulmonology for further clinical evaluation should also be considered. 3. Dilatation of the pulmonic trunk (3.6 cm in  diameter), concerning for pulmonary arterial hypertension. Electronically Signed   By: DVinnie LangtonM.D.   On: 08/09/2022 07:46   DG Chest Port 1 View  Result Date: 08/09/2022 CLINICAL DATA:  27year old female status post syncope. On home oxygen. Shortness of breath. Test for COVID-19 pending EXAM: PORTABLE CHEST 1 VIEW COMPARISON:  Chest radiographs 07/09/2022 and earlier. FINDINGS: Portable AP upright view at 0420 hours. Moderate cardiomegaly appears stable along with other mediastinal contours. Visualized tracheal air column is within normal limits. Lung volumes are stable and within normal limits. Allowing for portable technique the lungs are clear. No pneumothorax, pulmonary edema, pleural effusion. Paucity of bowel gas the visible abdomen. No osseous abnormality identified. IMPRESSION: Stable cardiomegaly. No acute cardiopulmonary abnormality. Electronically Signed   By: HGenevie AnnM.D.   On: 08/09/2022 04:41   CT Head Wo Contrast  Result Date: 08/09/2022 CLINICAL DATA:  27year old female status post  syncope. On home oxygen. Shortness of breath. EXAM: CT HEAD WITHOUT CONTRAST TECHNIQUE: Contiguous axial images were obtained from the base of the skull through the vertex without intravenous contrast. RADIATION DOSE REDUCTION: This exam was performed according to the departmental dose-optimization program which includes automated exposure control, adjustment of the mA and/or kV according to patient size and/or use of iterative reconstruction technique. COMPARISON:  None Available. FINDINGS: Brain: Normal cerebral volume. No midline shift, ventriculomegaly, mass effect, evidence of mass lesion, intracranial hemorrhage or evidence of cortically based acute infarction. Gray-white matter differentiation is within normal limits throughout the brain. Vascular: No suspicious intracranial vascular hyperdensity. Skull: Negative. Sinuses/Orbits: Mild right mastoid effusion. Right tympanic cavity is clear. Left  tympanic cavity and mastoids are clear. Paranasal sinuses are well aerated. Other: Adenoid hypertrophy, nonspecific and might be physiologic in this age group. Visualized orbits and scalp soft tissues are within normal limits. IMPRESSION: 1. Normal noncontrast CT appearance of the brain. 2. Adenoid hypertrophy with mild right mastoid effusion. These might be inflammatory (URI) or postinflammatory. Right middle ear and paranasal sinuses are well aerated. Electronically Signed   By: Genevie Ann M.D.   On: 08/09/2022 04:38      Assessment/Plan Principal Problem:   Interstitial lung disease exacerbation Active Problems:   Acute on chronic respiratory failure with hypoxia (HCC)   Essential hypertension   Myocardial injury   Hypokalemia   Hypoglycemia   Pulmonary embolism (HCC)   Pulmonary HTN (HCC)   Chronic diastolic CHF (congestive heart failure) (HCC)   Obesity, Class III, BMI 40-49.9 (morbid obesity) ([place])   Syncope   Assessment and Plan:  Interstitial lung disease exacerbation and acute on chronic respiratory failure with hypoxia ([place]): Respiratory status has improved.  Patient is currently on 9 L oxygen which is her home level oxygen.  PCR negative for COVID, flu and RSV.  Chest x-ray negative for infiltration.  CTA negative for PE.  Consulted Dr. Mortimer Fries for pulmonology.  Per Dr. Mortimer Fries, no steroid or antibiotic needed now.  - will admit to PCU as inpatient -Bronchodilators -Solu-Medrol 125 mg IV was given in ED -Mucinex for cough  -Incentive spirometry -sputum culture -Nasal cannula oxygen as needed to maintain O2 saturation 93% or greater -check RVP  Essential hypertension: -IV hydralazine as needed Torsemide, spironolactone  Myocardial injury: trop  64 --> 88 -ASA -lipitor 40 mg daily -trend trop -check A1c and LFP  Hypokalemia: Potassium 3.0 -Repleted potassium -Check magnesium and phosphorus level  Hypoglycemia: Blood sugar 57 and BMI of -Check CBG every hour -As needed  D50  Pulmonary embolism (HCC) -Eliquis  Pulmonary HTN (HCC) -Tyvaso inhaler  Chronic diastolic CHF (congestive heart failure) ([place]): 2D echo on 05/26/2022 showed EF of 55 to 60%.  BNP 251, patient has trace leg edema, no pulm edema chest x-ray.  Does not seem to have CHF exacerbation -Continue torsemide and spironolactone  Obesity, Class III, BMI 40-49.9 (morbid obesity) ([place]): Body weight 108.9, BMI 45.35 -Continue Jardiance -Exercise and healthy diet -Encourage losing weight  Syncope: CT head negative for acute intracranial abnormality.  CTA negative for PE.  Possibly due to vasovagal syncope -Fall precaution, frequent neurocheck -Check orthostatic vital sign    DVT ppx: on Eliquis  Code Status: Full code  Family Communication: not done, no family member is at bed side.       Disposition Plan:  Anticipate discharge back to previous environment  Consults called:  Consulted Dr. Mortimer Fries of pulm.   Admission status and Level of  care: Progressive:  as inpt      Dispo: The patient is from: Home              Anticipated d/c is to: Home              Anticipated d/c date is: 2 days              Patient currently is not medically stable to d/c.    Severity of Illness:  The appropriate patient status for this patient is INPATIENT. Inpatient status is judged to be reasonable and necessary in order to provide the required intensity of service to ensure the patient's safety. The patient's presenting symptoms, physical exam findings, and initial radiographic and laboratory data in the context of their chronic comorbidities is felt to place them at high risk for further clinical deterioration. Furthermore, it is not anticipated that the patient will be medically stable for discharge from the hospital within 2 midnights of admission.   * I certify that at the point of admission it is my clinical judgment that the patient will require inpatient hospital care spanning beyond 2 midnights  from the point of admission due to high intensity of service, high risk for further deterioration and high frequency of surveillance required.*       Date of Service 08/09/2022    Ivor Costa [facility]     If 7PM-7AM, please contact night-coverage [email or web address] 08/09/2022, 5:51 PM

## 2022-08-10 DIAGNOSIS — J849 Interstitial pulmonary disease, unspecified: Secondary | ICD-10-CM | POA: Diagnosis not present

## 2022-08-10 DIAGNOSIS — I2782 Chronic pulmonary embolism: Secondary | ICD-10-CM

## 2022-08-10 DIAGNOSIS — I10 Essential (primary) hypertension: Secondary | ICD-10-CM | POA: Diagnosis not present

## 2022-08-10 DIAGNOSIS — D751 Secondary polycythemia: Secondary | ICD-10-CM | POA: Insufficient documentation

## 2022-08-10 DIAGNOSIS — I2489 Other forms of acute ischemic heart disease: Secondary | ICD-10-CM | POA: Insufficient documentation

## 2022-08-10 DIAGNOSIS — J9621 Acute and chronic respiratory failure with hypoxia: Secondary | ICD-10-CM | POA: Diagnosis not present

## 2022-08-10 DIAGNOSIS — E876 Hypokalemia: Secondary | ICD-10-CM | POA: Diagnosis not present

## 2022-08-10 LAB — HEMOGLOBIN A1C
Hgb A1c MFr Bld: 6.1 % — ABNORMAL HIGH (ref 4.8–5.6)
Mean Plasma Glucose: 128 mg/dL

## 2022-08-10 LAB — RESPIRATORY PANEL BY PCR

## 2022-08-10 LAB — BASIC METABOLIC PANEL
Anion gap: 11 (ref 5–15)
BUN: 16 mg/dL (ref 6–20)
CO2: 24 mmol/L (ref 22–32)
Calcium: 9.6 mg/dL (ref 8.9–10.3)
Chloride: 102 mmol/L (ref 98–111)
Creatinine, Ser: 1.02 mg/dL — ABNORMAL HIGH (ref 0.44–1.00)
GFR, Estimated: >60 mL/min (ref >60)
Glucose, Bld: 113 mg/dL — ABNORMAL HIGH (ref 70–99)
Potassium: 4 mmol/L (ref 3.5–5.1)
Sodium: 137 mmol/L (ref 135–145)

## 2022-08-10 LAB — LIPID PANEL
Cholesterol: 151 mg/dL (ref 0–200)
HDL: 29 mg/dL — ABNORMAL LOW (ref >40)
LDL Cholesterol: 109 mg/dL — ABNORMAL HIGH (ref 0–99)
Total CHOL/HDL Ratio: 5.2 RATIO
Triglycerides: 65 mg/dL (ref <150)
VLDL: 13 mg/dL (ref 0–40)

## 2022-08-10 LAB — CBC
HCT: 48.1 % — ABNORMAL HIGH (ref 36.0–46.0)
Hemoglobin: 16.6 g/dL — ABNORMAL HIGH (ref 12.0–15.0)
MCH: 27.1 pg (ref 26.0–34.0)
MCHC: 34.5 g/dL (ref 30.0–36.0)
MCV: 78.5 fL — ABNORMAL LOW (ref 80.0–100.0)
Platelets: 308 10*3/uL (ref 150–400)
RBC: 6.13 MIL/uL — ABNORMAL HIGH (ref 3.87–5.11)
RDW: 19.5 % — ABNORMAL HIGH (ref 11.5–15.5)
WBC: 29.3 10*3/uL — ABNORMAL HIGH (ref 4.0–10.5)
nRBC: 0 % (ref 0.0–0.2)

## 2022-08-10 LAB — GLUCOSE, CAPILLARY
Glucose-Capillary: 123 mg/dL — ABNORMAL HIGH (ref 70–99)
Glucose-Capillary: 106 mg/dL — ABNORMAL HIGH (ref 70–99)

## 2022-08-10 NOTE — TOC Initial Note (Signed)
Transition of Care [facility]) - Initial/Assessment Note    Patient Details  Name: Yesenia Carrillo MRN: [id number] Date of Birth: 1995-10-15  [facility]) CM/SW Contact:    Laurena Slimmer, RN Phone Number: 08/10/2022, 11:53 AM  Clinical Narrative:                  Transition of Care (TOC) Screening Note   Patient Details  Name: Yesenia Carrillo Date of Birth: 12-Aug-1995   Transition of Care [facility]) CM/SW Contact:    Laurena Slimmer, RN Phone Number: 08/10/2022, 11:53 AM    Transition of Care Department (TOC) has reviewed patient and no TOC needs have been identified at this time. We will continue to monitor patient advancement through interdisciplinary progression rounds. If new patient transition needs arise, please place a TOC consult.          Patient Goals and CMS Choice            Expected Discharge Plan and Services         Expected Discharge Date: 08/10/22                                    Prior Living Arrangements/Services                       Activities of Daily Living Home Assistive Devices/Equipment: Oxygen ADL Screening (condition at time of admission) Patient's cognitive ability adequate to safely complete daily activities?: Yes Is the patient deaf or have difficulty hearing?: No Does the patient have difficulty seeing, even when wearing glasses/contacts?: No Does the patient have difficulty concentrating, remembering, or making decisions?: No Patient able to express need for assistance with ADLs?: No Does the patient have difficulty dressing or bathing?: No Independently performs ADLs?: Yes (appropriate for developmental age) Does the patient have difficulty walking or climbing stairs?: No Weakness of Legs: None Weakness of Arms/Hands: None  Permission Sought/Granted                  Emotional Assessment              Admission diagnosis:  Hypokalemia [E87.6] Interstitial lung disease ([place]) [J84.9] ILD  (interstitial lung disease) ([place]) [J84.9] Elevated troponin [R79.89] Acute respiratory failure, unspecified whether with hypoxia or hypercapnia ([place]) [J96.00] Acute on chronic respiratory failure with hypoxia ([place]) [J96.21] Patient Active Problem List   Diagnosis Date Noted   Interstitial lung disease exacerbation 08/09/2022   Hypoglycemia 08/09/2022   Syncope 08/09/2022   Influenza A 05/23/2022   Acute on chronic respiratory failure with hypoxia ([place]) 05/23/2022   Obesity, Class III, BMI 40-49.9 (morbid obesity) ([place]) 05/23/2022   Severe sepsis ([place]) 05/23/2022   ILD (interstitial lung disease) ([place]) 05/23/2022   Myocardial injury 05/23/2022   Hypokalemia 05/23/2022   Chronic diastolic CHF (congestive heart failure) ([place]) 05/23/2022   Pulmonary HTN (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 [place], [place] - [address] [facility] [place] [address] Phone: [phone] Fax: [place] [address] Phone: [phone] Fax: [phone]     Social Determinants of Health (SDOH) Social History: [place]: No Food Insecurity (08/09/2022)  Housing: Low Risk  (08/09/2022)  Transportation Needs: No Transportation Needs (08/09/2022)  Utilities: Not At Risk (08/09/2022)  Tobacco Use: Medium Risk (08/09/2022)   SDOH Interventions:     Readmission Risk Interventions     No data to display

## 2022-08-10 NOTE — Discharge Summary (Signed)
Physician Discharge Summary  Yesenia Carrillo [id number] DOB: 09-13-1995 DOA: 08/09/2022  PCP: [facility] date: 08/09/2022 Discharge date: 08/10/2022 Admitted From: Home Disposition: Home Recommendations for Outpatient Follow-up:  Follow up with PCP in in 1 to 2 weeks Patient reports upcoming appointment with pulmonology and cardiology Check CMP and CBC at follow-up Please follow up on the following pending results: None  Home Health: Not indicated Equipment/Devices: Not in the gated  Discharge Condition: Stable CODE STATUS: Full code  [place]. Go in 1 week(s).   Specialty: General Practice Why: Appointment on Monday, 08/21/2022 at 10:00am. Contact information: [place]. [place] [address] [place] [facility] 27 year old F with PMH of ILD and chronic hypoxic RF on 8 to 9 L oxygen via [place], diastolic CHF, PAH, PE on Eliquis, history of intubation and ECMO, HTN, GERD and morbid obesity presenting with shortness of breath and near syncope, and admitted for acute on chronic respiratory failure with hypoxia and near syncope.  Per H&P, patient endorses aching in her head and LOC although she denied loss of consciousness the next day.  She had increased oxygen requirement to 11 L by HFNC.    In ED, WBC 18.9 with left shift.  Hgb 17.2.  K3.0.  COVID-19, influenza and RSV PCR nonreactive.  BNP 251.  Troponin 64>> 84.  CTA chest negative for PE.  CT head without acute finding.  Patient was given IV Solu-Medrol and started on breathing treatments.  Pulmonology consulted and recommended continuing's home supplemental oxygen, inhalers, and outpatient follow-up with her pulmonologist at [facility].  Pulmonology did not feel there is any need for steroids or antibiotics.   On the day of discharge, patient remained stable on home 8 to 9 L.  Felt well and back to her  baseline and requested discharge.  She is already on triple therapy.  She says she has upcoming appointment with her cardiologist and pulmonologist.  She was able to ambulate independently without symptoms.  See individual problem list below for more.   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  Nontoxic. HEENT: MMM.  Vision and hearing grossly intact.  NECK: Supple.  No apparent JVD.  RESP:  No IWOB.  Fair aeration bilaterally. CVS:  RRR. Heart sounds normal.  ABD/GI/GU: BS+. Abd soft, NTND.  MSK/EXT:  Moves extremities. No apparent deformity. No edema.  SKIN: no apparent skin lesion or wound NEURO: Awake and alert. Oriented appropriately.  No apparent focal neuro deficit. PSYCH: Calm. Normal affect.   Discharge Instructions Discharge Instructions     Call MD for:  difficulty breathing, headache or visual disturbances   Complete by: As directed    Call MD for:  extreme fatigue   Complete by: As directed    Call MD for:  persistant dizziness or light-headedness   Complete by: As directed    Diet - low sodium heart healthy   Complete by: As directed    Discharge instructions    Complete by: As directed    It has been a pleasure taking care of you!  You were hospitalized due to shortness of breath and low oxygen level.  Your symptoms and oxygen improved.  Continue using your medications as prescribed.  Follow-up with your primary care doctor in 1 to 2 weeks or sooner if needed.  Follow-up with your cardiologist and pulmonologist as previously planned.   Take care,   Increase activity slowly   Complete by: As directed       Allergies as of 08/10/2022       Reactions   Ampicillin Rash, Other (See Comments)   Patient and family cannot remember the specifics of the reaction.   Penicillins Hives        Medication List     STOP taking these medications    predniSONE 20 MG tablet Commonly known as: DELTASONE       TAKE these medications    albuterol 108 (90 Base) MCG/ACT inhaler Commonly known as: VENTOLIN HFA Inhale 1-2 puffs into the lungs every 6 (six) hours as needed for wheezing or shortness of breath.   Eliquis 5 MG Tabs tablet Generic drug: apixaban Take 5 mg by mouth 2 (two) times daily.   empagliflozin 10 MG Tabs tablet Commonly known as: JARDIANCE Take 10 mg by mouth daily.   famotidine 20 MG tablet Commonly known as: PEPCID Take 20 mg by mouth 2 (two) times daily.   sildenafil 20 MG tablet Commonly known as: REVATIO Take 20 mg by mouth 3 (three) times daily.   spironolactone 25 MG tablet Commonly known as: ALDACTONE Take 25 mg by mouth daily.   torsemide 20 MG tablet Commonly known as: DEMADEX Take 40 mg by mouth 2 (two) times daily.   Trelegy Ellipta 200-62.5-25 MCG/ACT Aepb Generic drug: Fluticasone-Umeclidin-Vilant Inhale 1 puff into the lungs daily.   Tyvaso Refill 0.6 MG/ML Soln Generic drug: Treprostinil Inhale 18 mcg into the lungs every 4 (four) hours while awake.        Consultations: Pulmonology  Procedures/Studies:   CT Angio Chest PE W/Cm &/Or Wo Cm  Result Date: 08/09/2022 CLINICAL DATA:   27 year old female with history of chest pain and shortness of breath. EXAM: CT ANGIOGRAPHY CHEST WITH CONTRAST TECHNIQUE: Multidetector CT imaging of the chest was performed using the standard protocol during bolus administration of intravenous contrast. Multiplanar CT image reconstructions and MIPs were obtained to evaluate the vascular anatomy. RADIATION DOSE REDUCTION: This exam was performed according to the departmental dose-optimization program which includes automated exposure control, adjustment of the mA and/or kV according to patient size and/or use of iterative reconstruction technique. CONTRAST:  20m OMNIPAQUE IOHEXOL 350 MG/ML SOLN COMPARISON:  Chest CTA 10/29/2019. FINDINGS: Cardiovascular: No filling defects within the pulmonary arterial tree to suggest pulmonary embolism. Heart size is normal. There is no  significant pericardial fluid, thickening or pericardial calcification. Dilatation of the pulmonic trunk (3.6 cm in diameter). No atherosclerotic calcifications are noted in the thoracic aorta or the coronary arteries. Mediastinum/Nodes: Multiple prominent borderline enlarged mediastinal lymph nodes, less pronounced than prior examination from [id number], of uncertain etiology and significance, but presumably benign. Esophagus is unremarkable in appearance. Lungs/Pleura: Diffuse ground-glass attenuation noted throughout both lungs. No confluent consolidative airspace disease. No pleural effusions. No suspicious appearing pulmonary nodules or masses are noted. Upper Abdomen: Unremarkable. Musculoskeletal: There are no aggressive appearing lytic or blastic lesions noted in the visualized portions of the skeleton. Review of the MIP images confirms the above findings. IMPRESSION: 1. No evidence of pulmonary embolism. 2. Diffuse ground-glass attenuation in both lungs. Multiple prominent borderline enlarged mediastinal and bilateral hilar lymph nodes, nonspecific, but less pronounced than prior  examination and presumably benign. Clinical correlation for systemic disease, including HIV, is recommended, as this could explain both findings with chronic lymphadenopathy and ground-glass attenuation in the lungs which could reflect acute infection such as pneumocystis. Alternatively, the findings in the lungs could reflect interstitial lung disease. Referral to Pulmonology for further clinical evaluation should also be considered. 3. Dilatation of the pulmonic trunk (3.6 cm in diameter), concerning for pulmonary arterial hypertension. Electronically Signed   By: Vinnie Langton M.D.   On: 08/09/2022 07:46   DG Chest Port 1 View  Result Date: 08/09/2022 CLINICAL DATA:  27 year old female status post syncope. On home oxygen. Shortness of breath. Test for COVID-19 pending EXAM: PORTABLE CHEST 1 VIEW COMPARISON:  Chest radiographs 07/09/2022 and earlier. FINDINGS: Portable AP upright view at 0420 hours. Moderate cardiomegaly appears stable along with other mediastinal contours. Visualized tracheal air column is within normal limits. Lung volumes are stable and within normal limits. Allowing for portable technique the lungs are clear. No pneumothorax, pulmonary edema, pleural effusion. Paucity of bowel gas the visible abdomen. No osseous abnormality identified. IMPRESSION: Stable cardiomegaly. No acute cardiopulmonary abnormality. Electronically Signed   By: Genevie Ann M.D.   On: 08/09/2022 04:41   CT Head Wo Contrast  Result Date: 08/09/2022 CLINICAL DATA:  27 year old female status post syncope. On home oxygen. Shortness of breath. EXAM: CT HEAD WITHOUT CONTRAST TECHNIQUE: Contiguous axial images were obtained from the base of the skull through the vertex without intravenous contrast. RADIATION DOSE REDUCTION: This exam was performed according to the departmental dose-optimization program which includes automated exposure control, adjustment of the mA and/or kV according to patient size and/or use of iterative  reconstruction technique. COMPARISON:  None Available. FINDINGS: Brain: Normal cerebral volume. No midline shift, ventriculomegaly, mass effect, evidence of mass lesion, intracranial hemorrhage or evidence of cortically based acute infarction. Gray-white matter differentiation is within normal limits throughout the brain. Vascular: No suspicious intracranial vascular hyperdensity. Skull: Negative. Sinuses/Orbits: Mild right mastoid effusion. Right tympanic cavity is clear. Left tympanic cavity and mastoids are clear. Paranasal sinuses are well aerated. Other: Adenoid hypertrophy, nonspecific and might be physiologic in this age group. Visualized orbits and scalp soft tissues are within normal limits. IMPRESSION: 1. Normal noncontrast CT appearance of the brain. 2. Adenoid hypertrophy with mild right mastoid effusion. These might be inflammatory (URI) or postinflammatory. Right middle ear and paranasal sinuses are well aerated. Electronically Signed   By: Genevie Ann M.D.   On: 08/09/2022 04:38       The results of significant diagnostics from this hospitalization (including imaging, microbiology, ancillary and laboratory) are listed below for reference.     Microbiology: Recent  Results (from the past 240 hour(s))  Resp panel by RT-PCR (RSV, Flu A&B, Covid) Anterior Nasal Swab     Status: None   Collection Time: 08/09/22  5:01 AM   Specimen: Anterior Nasal Swab  Result Value Ref Range Status   SARS Coronavirus 2 by RT PCR NEGATIVE NEGATIVE Final    Comment: (NOTE) SARS-CoV-2 target nucleic acids are NOT DETECTED.  The SARS-CoV-2 RNA is generally detectable in upper respiratory specimens during the acute phase of infection. The lowest concentration of SARS-CoV-2 viral copies this assay can detect is 138 copies/mL. A negative result does not preclude SARS-Cov-2 infection and should not be used as the sole basis for treatment or other patient management decisions. A negative result may occur with   improper specimen collection/handling, submission of specimen other than nasopharyngeal swab, presence of viral mutation(s) within the areas targeted by this assay, and inadequate number of viral copies(<138 copies/mL). A negative result must be combined with clinical observations, patient history, and epidemiological information. The expected result is Negative.  Fact Sheet for Patients:  EntrepreneurPulse.com.au  Fact Sheet for Healthcare Providers:  IncredibleEmployment.be  This test is no t yet approved or cleared by the [place] FDA and  has been authorized for detection and/or diagnosis of SARS-CoV-2 by FDA under an Emergency Use Authorization (EUA). This EUA will remain  in effect (meaning this test can be used) for the duration of the COVID-19 declaration under Section 564(b)(1) of the Act, 21 U.S.C.section 360bbb-3(b)(1), unless the authorization is terminated  or revoked sooner.       Influenza A by PCR NEGATIVE NEGATIVE Final   Influenza B by PCR NEGATIVE NEGATIVE Final    Comment: (NOTE) The Xpert Xpress SARS-CoV-2/FLU/RSV plus assay is intended as an aid in the diagnosis of influenza from Nasopharyngeal swab specimens and should not be used as a sole basis for treatment. Nasal washings and aspirates are unacceptable for Xpert Xpress SARS-CoV-2/FLU/RSV testing.  Fact Sheet for Patients: EntrepreneurPulse.com.au  Fact Sheet for Healthcare Providers: IncredibleEmployment.be  This test is not yet approved or cleared by the [place] FDA and has been authorized for detection and/or diagnosis of SARS-CoV-2 by FDA under an Emergency Use Authorization (EUA). This EUA will remain in effect (meaning this test can be used) for the duration of the COVID-19 declaration under Section 564(b)(1) of the Act, 21 U.S.C. section 360bbb-3(b)(1), unless the authorization is terminated or revoked.      Resp Syncytial Virus by PCR NEGATIVE NEGATIVE Final    Comment: (NOTE) Fact Sheet for Patients: EntrepreneurPulse.com.au  Fact Sheet for Healthcare Providers: IncredibleEmployment.be  This test is not yet approved or cleared by the [place] FDA and has been authorized for detection and/or diagnosis of SARS-CoV-2 by FDA under an Emergency Use Authorization (EUA). This EUA will remain in effect (meaning this test can be used) for the duration of the COVID-19 declaration under Section 564(b)(1) of the Act, 21 U.S.C. section 360bbb-3(b)(1), unless the authorization is terminated or revoked.  Performed at [facility], [place], [place] [address]   Respiratory (~20 pathogens) panel by PCR     Status: None   Collection Time: 08/10/22  6:15 AM   Specimen: Nasopharyngeal Swab; Respiratory  Result Value Ref Range Status   Adenovirus NOT DETECTED NOT DETECTED Final   Coronavirus 229E NOT DETECTED NOT DETECTED Final    Comment: (NOTE) The Coronavirus on the Respiratory Panel, DOES NOT test for the novel  Coronavirus (2019 nCoV)  Coronavirus HKU1 NOT DETECTED NOT DETECTED Final   Coronavirus NL63 NOT DETECTED NOT DETECTED Final   Coronavirus OC43 NOT DETECTED NOT DETECTED Final   Metapneumovirus NOT DETECTED NOT DETECTED Final   Rhinovirus / Enterovirus NOT DETECTED NOT DETECTED Final   Influenza A NOT DETECTED NOT DETECTED Final   Influenza B NOT DETECTED NOT DETECTED Final   Parainfluenza Virus 1 NOT DETECTED NOT DETECTED Final   Parainfluenza Virus 2 NOT DETECTED NOT DETECTED Final   Parainfluenza Virus 3 NOT DETECTED NOT DETECTED Final   Parainfluenza Virus 4 NOT DETECTED NOT DETECTED Final   Respiratory Syncytial Virus NOT DETECTED NOT DETECTED Final   Bordetella pertussis NOT DETECTED NOT DETECTED Final   Bordetella Parapertussis NOT DETECTED NOT DETECTED Final   Chlamydophila pneumoniae NOT DETECTED NOT  DETECTED Final   Mycoplasma pneumoniae NOT DETECTED NOT DETECTED Final    Comment: Performed at [facility], [place] [address] [place], [place] [address]     Labs:  CBC: Recent Labs  Lab 08/09/22 0501 08/10/22 0614  WBC 18.9* 29.3*  NEUTROABS 15.5*  --   HGB 17.2* 16.6*  HCT 50.1* 48.1*  MCV 79.4* 78.5*  PLT 326 308   BMP &GFR Recent Labs  Lab 08/09/22 0501 08/09/22 2215 08/10/22 0614  NA 138  --  137  K 3.0*  --  4.0  CL 102  --  102  CO2 25  --  24  GLUCOSE 57*  --  113*  BUN 12  --  16  CREATININE 1.10*  --  1.02*  CALCIUM 9.0  --  9.6  MG  --  2.3  --   PHOS  --  2.7  --    Estimated Creatinine Clearance: 93.7 mL/min (A) (by C-G formula based on SCr of 1.02 mg/dL (H)). Liver & Pancreas: Recent Labs  Lab 08/09/22 0501  AST 31  ALT 15  ALKPHOS 134*  BILITOT 3.1*  PROT 8.1  ALBUMIN 3.9   No results for input(s): "LIPASE", "AMYLASE" in the last 168 hours. No results for input(s): "AMMONIA" in the last 168 hours. Diabetic: Recent Labs    08/09/22 0853  HGBA1C 6.1*   Recent Labs  Lab 08/09/22 1609 08/09/22 1956 08/09/22 2348 08/10/22 0742 08/10/22 1205  GLUCAP 173* 218* 129* 123* 106*   Cardiac Enzymes: No results for input(s): "CKTOTAL", "CKMB", "CKMBINDEX", "TROPONINI" in the last 168 hours. No results for input(s): "PROBNP" in the last 8760 hours. Coagulation Profile: No results for input(s): "INR", "PROTIME" in the last 168 hours. Thyroid Function Tests: No results for input(s): "TSH", "T4TOTAL", "FREET4", "T3FREE", "THYROIDAB" in the last 72 hours. Lipid Profile: Recent Labs    08/10/22 0614  CHOL 151  HDL 29*  LDLCALC 109*  TRIG 65  CHOLHDL 5.2   Anemia Panel: No results for input(s): "VITAMINB12", "FOLATE", "FERRITIN", "TIBC", "IRON", "RETICCTPCT" in the last 72 hours. Urine analysis:    Component Value Date/Time   COLORURINE YELLOW (A) 05/23/2022 1022   APPEARANCEUR CLEAR (A) 05/23/2022 1022   APPEARANCEUR Clear  06/06/2013 2001   LABSPEC 1.017 05/23/2022 1022   LABSPEC 1.013 06/06/2013 2001   PHURINE 5.0 05/23/2022 1022   GLUCOSEU >=500 (A) 05/23/2022 1022   GLUCOSEU Negative 06/06/2013 2001   HGBUR LARGE (A) 05/23/2022 1022   BILIRUBINUR NEGATIVE 05/23/2022 1022   BILIRUBINUR Negative 06/06/2013 2001   KETONESUR NEGATIVE 05/23/2022 1022   PROTEINUR NEGATIVE 05/23/2022 1022   NITRITE NEGATIVE 05/23/2022 1022   LEUKOCYTESUR NEGATIVE 05/23/2022 1022  LEUKOCYTESUR Negative 06/06/2013 2001   Sepsis Labs: Invalid input(s): "PROCALCITONIN", "LACTICIDVEN"   SIGNED:  Mercy Riding, MD  [facility] 08/10/2022, 4:17 PM

## 2022-08-20 LAB — BLOOD GAS, VENOUS
Acid-Base Excess: 4 mmol/L — ABNORMAL HIGH (ref 0.0–2.0)
Bicarbonate: 29.2 mmol/L — ABNORMAL HIGH (ref 20.0–28.0)
O2 Saturation: 79.3 %
Patient temperature: 37.1
pCO2, Ven: 45 mmHg (ref 44–60)
pH, Ven: 7.42 (ref 7.25–7.43)
pO2, Ven: 53 mmHg — ABNORMAL HIGH (ref 32–45)

## 2022-09-26 ENCOUNTER — Inpatient Hospital Stay
Admission: EM | Admit: 2022-09-26 | Discharge: 2022-09-29 | DRG: 189 | Disposition: A | Discharge status: Home / Self Care | Payer: Medicaid Other | Attending: Hospitalist | Admitting: Hospitalist

## 2022-09-26 ENCOUNTER — Other Ambulatory Visit: Payer: Self-pay

## 2022-09-26 ENCOUNTER — Emergency Department: Payer: Medicaid Other

## 2022-09-26 DIAGNOSIS — Z7901 Long term (current) use of anticoagulants: Secondary | ICD-10-CM

## 2022-09-26 DIAGNOSIS — Z87891 Personal history of nicotine dependence: Secondary | ICD-10-CM

## 2022-09-26 DIAGNOSIS — Z79899 Other long term (current) drug therapy: Secondary | ICD-10-CM

## 2022-09-26 DIAGNOSIS — I5032 Chronic diastolic (congestive) heart failure: Secondary | ICD-10-CM | POA: Diagnosis present

## 2022-09-26 DIAGNOSIS — J189 Pneumonia, unspecified organism: Secondary | ICD-10-CM | POA: Diagnosis present

## 2022-09-26 DIAGNOSIS — I272 Pulmonary hypertension, unspecified: Secondary | ICD-10-CM | POA: Diagnosis present

## 2022-09-26 DIAGNOSIS — E876 Hypokalemia: Secondary | ICD-10-CM | POA: Diagnosis present

## 2022-09-26 DIAGNOSIS — Z1152 Encounter for screening for COVID-19: Secondary | ICD-10-CM | POA: Diagnosis not present

## 2022-09-26 DIAGNOSIS — J849 Interstitial pulmonary disease, unspecified: Secondary | ICD-10-CM | POA: Diagnosis present

## 2022-09-26 DIAGNOSIS — J9621 Acute and chronic respiratory failure with hypoxia: Secondary | ICD-10-CM | POA: Diagnosis present

## 2022-09-26 DIAGNOSIS — R112 Nausea with vomiting, unspecified: Secondary | ICD-10-CM

## 2022-09-26 DIAGNOSIS — B9789 Other viral agents as the cause of diseases classified elsewhere: Secondary | ICD-10-CM | POA: Diagnosis present

## 2022-09-26 DIAGNOSIS — R0602 Shortness of breath: Secondary | ICD-10-CM | POA: Diagnosis present

## 2022-09-26 DIAGNOSIS — Z880 Allergy status to penicillin: Secondary | ICD-10-CM | POA: Diagnosis not present

## 2022-09-26 DIAGNOSIS — A084 Viral intestinal infection, unspecified: Secondary | ICD-10-CM | POA: Diagnosis present

## 2022-09-26 DIAGNOSIS — Z7984 Long term (current) use of oral hypoglycemic drugs: Secondary | ICD-10-CM

## 2022-09-26 DIAGNOSIS — G43909 Migraine, unspecified, not intractable, without status migrainosus: Secondary | ICD-10-CM | POA: Diagnosis present

## 2022-09-26 DIAGNOSIS — Z86711 Personal history of pulmonary embolism: Secondary | ICD-10-CM | POA: Diagnosis not present

## 2022-09-26 DIAGNOSIS — Z8616 Personal history of COVID-19: Secondary | ICD-10-CM

## 2022-09-26 DIAGNOSIS — Z6841 Body Mass Index (BMI) 40.0 and over, adult: Secondary | ICD-10-CM | POA: Diagnosis not present

## 2022-09-26 DIAGNOSIS — R7881 Bacteremia: Secondary | ICD-10-CM | POA: Diagnosis not present

## 2022-09-26 DIAGNOSIS — D72829 Elevated white blood cell count, unspecified: Secondary | ICD-10-CM | POA: Diagnosis not present

## 2022-09-26 DIAGNOSIS — I110 Hypertensive heart disease with heart failure: Secondary | ICD-10-CM | POA: Diagnosis present

## 2022-09-26 DIAGNOSIS — B952 Enterococcus as the cause of diseases classified elsewhere: Secondary | ICD-10-CM | POA: Diagnosis not present

## 2022-09-26 HISTORY — DX: Heart failure, unspecified: I50.9

## 2022-09-26 LAB — CBC WITH DIFFERENTIAL/PLATELET
Abs Immature Granulocytes: 0.05 10*3/uL (ref 0.00–0.07)
Basophils Absolute: 0 10*3/uL (ref 0.0–0.1)
Basophils Relative: 0 %
Eosinophils Absolute: 0.1 10*3/uL (ref 0.0–0.5)
Eosinophils Relative: 1 %
HCT: 44 % (ref 36.0–46.0)
Hemoglobin: 15.1 g/dL — ABNORMAL HIGH (ref 12.0–15.0)
Immature Granulocytes: 0 %
Lymphocytes Relative: 31 %
Lymphs Abs: 3.7 10*3/uL (ref 0.7–4.0)
MCH: 27 pg (ref 26.0–34.0)
MCHC: 34.3 g/dL (ref 30.0–36.0)
MCV: 78.7 fL — ABNORMAL LOW (ref 80.0–100.0)
Monocytes Absolute: 1.1 10*3/uL — ABNORMAL HIGH (ref 0.1–1.0)
Monocytes Relative: 10 %
Neutro Abs: 6.8 10*3/uL (ref 1.7–7.7)
Neutrophils Relative %: 58 %
Platelets: 376 10*3/uL (ref 150–400)
RBC: 5.59 MIL/uL — ABNORMAL HIGH (ref 3.87–5.11)
RDW: 20 % — ABNORMAL HIGH (ref 11.5–15.5)
WBC: 11.8 10*3/uL — ABNORMAL HIGH (ref 4.0–10.5)
nRBC: 1.2 % — ABNORMAL HIGH (ref 0.0–0.2)

## 2022-09-26 LAB — COMPREHENSIVE METABOLIC PANEL
ALT: 14 U/L (ref 0–44)
AST: 28 U/L (ref 15–41)
Albumin: 3.8 g/dL (ref 3.5–5.0)
Alkaline Phosphatase: 115 U/L (ref 38–126)
Anion gap: 14 (ref 5–15)
BUN: 11 mg/dL (ref 6–20)
CO2: 23 mmol/L (ref 22–32)
Calcium: 8.9 mg/dL (ref 8.9–10.3)
Chloride: 99 mmol/L (ref 98–111)
Creatinine, Ser: 0.91 mg/dL (ref 0.44–1.00)
GFR, Estimated: >60 mL/min (ref >60)
Glucose, Bld: 100 mg/dL — ABNORMAL HIGH (ref 70–99)
Potassium: 3.2 mmol/L — ABNORMAL LOW (ref 3.5–5.1)
Sodium: 136 mmol/L (ref 135–145)
Total Bilirubin: 3.9 mg/dL — ABNORMAL HIGH (ref 0.3–1.2)
Total Protein: 7.4 g/dL (ref 6.5–8.1)

## 2022-09-26 LAB — RESP PANEL BY RT-PCR (FLU A&B, COVID) ARPGX2
Influenza A by PCR: NEGATIVE
Influenza B by PCR: NEGATIVE
SARS Coronavirus 2 by RT PCR: NEGATIVE

## 2022-09-26 LAB — BLOOD GAS, VENOUS
Acid-Base Excess: 5 mmol/L — ABNORMAL HIGH (ref 0.0–2.0)
Bicarbonate: 29.9 mmol/L — ABNORMAL HIGH (ref 20.0–28.0)
O2 Saturation: 53.4 %
Patient temperature: 37
pCO2, Ven: 44 mmHg (ref 44–60)
pH, Ven: 7.44 — ABNORMAL HIGH (ref 7.25–7.43)
pO2, Ven: 39 mmHg (ref 32–45)

## 2022-09-26 LAB — PROTIME-INR
INR: 2.1 — ABNORMAL HIGH (ref 0.8–1.2)
Prothrombin Time: 23 seconds — ABNORMAL HIGH (ref 11.4–15.2)

## 2022-09-26 LAB — LACTIC ACID, PLASMA: Lactic Acid, Venous: 1.8 mmol/L (ref 0.5–1.9)

## 2022-09-26 LAB — APTT: aPTT: 42 seconds — ABNORMAL HIGH (ref 24–36)

## 2022-09-26 LAB — TROPONIN I (HIGH SENSITIVITY): Troponin I (High Sensitivity): 24 ng/L — ABNORMAL HIGH (ref <18)

## 2022-09-26 LAB — BRAIN NATRIURETIC PEPTIDE: B Natriuretic Peptide: 462.9 pg/mL — ABNORMAL HIGH (ref 0.0–100.0)

## 2022-09-26 MED ORDER — METHYLPREDNISOLONE SODIUM SUCC 125 MG IJ SOLR
125.0000 mg | Freq: Once | INTRAMUSCULAR | Status: AC
  Administered 2022-09-26: 125 mg via INTRAVENOUS
  Filled 2022-09-26: qty 2

## 2022-09-26 MED ORDER — SODIUM CHLORIDE 0.9 % IV SOLN
2.0000 g | INTRAVENOUS | Status: DC
  Administered 2022-09-26 – 2022-09-27 (×2): 2 g via INTRAVENOUS
  Filled 2022-09-26 (×2): qty 20

## 2022-09-26 MED ORDER — POTASSIUM CHLORIDE CRYS ER 20 MEQ PO TBCR
40.0000 meq | EXTENDED_RELEASE_TABLET | Freq: Once | ORAL | Status: AC
  Administered 2022-09-26: 40 meq via ORAL
  Filled 2022-09-26: qty 2

## 2022-09-26 MED ORDER — SODIUM CHLORIDE 0.9 % IV SOLN
500.0000 mg | INTRAVENOUS | Status: DC
  Administered 2022-09-26 – 2022-09-27 (×2): 500 mg via INTRAVENOUS
  Filled 2022-09-26 (×2): qty 5

## 2022-09-26 MED ORDER — ONDANSETRON HCL 4 MG/2ML IJ SOLN
4.0000 mg | Freq: Once | INTRAMUSCULAR | Status: AC
  Administered 2022-09-26: 4 mg via INTRAVENOUS
  Filled 2022-09-26: qty 2

## 2022-09-26 MED ORDER — IPRATROPIUM-ALBUTEROL 0.5-2.5 (3) MG/3ML IN SOLN
6.0000 mL | Freq: Once | RESPIRATORY_TRACT | Status: AC
  Administered 2022-09-26: 6 mL via RESPIRATORY_TRACT
  Filled 2022-09-26: qty 6

## 2022-09-26 NOTE — ED Provider Notes (Signed)
[facility] Provider Note    Event Date/Time   First MD Initiated Contact with Patient 09/26/22 2024     (approximate)   History   Shortness of Breath   HPI  Yesenia Carrillo is a 27 y.o. female past medical history significant for interstitial lung disease with chronic hypoxic respiratory failure on 8 to 9 L of home oxygen, HFpEF, pulmonary hypertension, pulmonary embolism on Eliquis, history of intubation and ECMO, who presents to the emergency department with shortness of breath.  Patient states that she started to feel sick last week whenever multiple family members were sick.  States that she developed a cough and was not feeling well.  Over the past 2 days states that her symptoms have worsened.  Complaining of a migraine headache.  Also complaining of chest pain and shortness of breath.  When EMS arrived she was hypoxic on her 8 L of oxygen to 74%, she was given a breathing treatment with improvement of her oxygen.  Denies any fever or chills.  States that she has been compliant with all of her home medications.  No unexplained weight gain or weight loss.  States that she has had decreased urine output but has been consistently taking her Lasix.  Endorses multiple episodes of nausea and vomiting and diarrhea that started today.  Denies any blood in her stool.  No abdominal pain at this time.  Denies any falls or trauma.  Last hospitalization was in March.     Physical Exam   Triage Vital Signs: ED Triage Vitals  Enc Vitals Group     BP 09/26/22 2034 96/64     Pulse Rate 09/26/22 2034 (!) 118     Resp 09/26/22 2034 (!) 30     Temp 09/26/22 2034 98 F (36.7 C)     Temp Source 09/26/22 2034 Oral     SpO2 09/26/22 2026 96 %     Weight 09/26/22 2037 244 lb 11.4 oz (111 kg)     Height 09/26/22 2037  (1.549 m)     Head Circumference --      Peak Flow --      Pain Score 09/26/22 2036 6     Pain Loc --      Pain Edu? --      Excl. in GC? --     Most  recent vital signs: Vitals:   09/26/22 2034 09/26/22 2300  BP: 96/64 100/74  Pulse: (!) 118 (!) 112  Resp: (!) 30 18  Temp: 98 F (36.7 C) 98.1 F (36.7 C)  SpO2: 100% 90%    Physical Exam Constitutional:      Appearance: She is well-developed. She is obese.  HENT:     Head: Atraumatic.  Eyes:     Conjunctiva/sclera: Conjunctivae normal.  Cardiovascular:     Rate and Rhythm: Regular rhythm.  Pulmonary:     Effort: Respiratory distress present.     Breath sounds: Wheezing present.     Comments: 8 L nasal cannula.  Speaking in 3 word sentences.  Diffuse expiratory wheezing. Abdominal:     General: There is no distension.  Musculoskeletal:        General: Normal range of motion.     Cervical back: Normal range of motion.     Right lower leg: No tenderness. No edema.     Left lower leg: No tenderness. No edema.  Skin:    General: Skin is warm.     Capillary  Refill: Capillary refill takes less than 2 seconds.  Neurological:     Mental Status: She is alert and oriented to person, place, and time. Mental status is at baseline.  Psychiatric:        Mood and Affect: Mood normal.     IMPRESSION / MDM / ASSESSMENT AND PLAN / ED COURSE  I reviewed the triage vital signs and the nursing notes.  Differential diagnosis including pneumonia, viral illness including COVID/influenza, acute on chronic exacerbation of her interstitial lung disease, ACS, anemia.  Patient has been compliant with her Eliquis.  EKG  I, Corena Herter, the attending physician, personally viewed and interpreted this ECG.   Rate: Tachycardic, 116  Rhythm: Sinus tachycardia  Axis: Normal  Intervals: Atrial enlargement  ST&T Change: None  Sinus tachycardia while on cardiac telemetry.  RADIOLOGY I independently reviewed imaging, my interpretation of imaging: Cardiomegaly but no obvious pulmonary edema.  No focal findings consistent with pneumonia.  LABS (all labs ordered are listed, but only abnormal  results are displayed) Labs interpreted as -    Labs Reviewed  COMPREHENSIVE METABOLIC PANEL - Abnormal; Notable for the following components:      Result Value   Potassium 3.2 (*)    Glucose, Bld 100 (*)    Total Bilirubin 3.9 (*)    All other components within normal limits  CBC WITH DIFFERENTIAL/PLATELET - Abnormal; Notable for the following components:   WBC 11.8 (*)    RBC 5.59 (*)    Hemoglobin 15.1 (*)    MCV 78.7 (*)    RDW 20.0 (*)    nRBC 1.2 (*)    Monocytes Absolute 1.1 (*)    All other components within normal limits  PROTIME-INR - Abnormal; Notable for the following components:   Prothrombin Time 23.0 (*)    INR 2.1 (*)    All other components within normal limits  APTT - Abnormal; Notable for the following components:   aPTT 42 (*)    All other components within normal limits  BRAIN NATRIURETIC PEPTIDE - Abnormal; Notable for the following components:   B Natriuretic Peptide 462.9 (*)    All other components within normal limits  BLOOD GAS, VENOUS - Abnormal; Notable for the following components:   pH, Ven 7.44 (*)    Bicarbonate 29.9 (*)    Acid-Base Excess 5.0 (*)    All other components within normal limits  TROPONIN I (HIGH SENSITIVITY) - Abnormal; Notable for the following components:   Troponin I (High Sensitivity) 24 (*)    All other components within normal limits  RESP PANEL BY RT-PCR (FLU A&B, COVID) ARPGX2  CULTURE, BLOOD (ROUTINE X 2)  CULTURE, BLOOD (ROUTINE X 2)  LACTIC ACID, PLASMA  LACTIC ACID, PLASMA  POC URINE PREG, ED  TROPONIN I (HIGH SENSITIVITY)     MDM    On arrival patient on her 8 L nasal cannula was 91%.  Given DuoNeb treatments.  Given IV Solu-Medrol.  Chest x-ray concerning for questionable right lower lobe pneumonia, concern for possible community-acquired pneumonia versus aspiration pneumonia.  Given IV Rocephin and azithromycin.  Given p.o. potassium replacement.  On reevaluation continues to be shortness of breath,  recurrent episodes of vomiting so given IV Zofran.  Complaining of worsening abdominal pain so obtain CT scan abdomen and pelvis, given her history of PE will add on a CTA to make sure that this is not the cause of her worsening hypoxia.  Consulted hospitalist for admission for acute on chronic  hypoxic respiratory failure in the setting of pneumonia.Marland Kitchen     PROCEDURES:  Critical Care performed: yes  .Critical Care  Performed by: Corena Herter, MD Authorized by: Corena Herter, MD   Critical care provider statement:    Critical care time (minutes):  30   Critical care time was exclusive of:  Separately billable procedures and treating other patients   Critical care was necessary to treat or prevent imminent or life-threatening deterioration of the following conditions:  Respiratory failure   Critical care was time spent personally by me on the following activities:  Development of treatment plan with patient or surrogate, discussions with consultants, evaluation of patient's response to treatment, examination of patient, ordering and review of laboratory studies, ordering and review of radiographic studies, ordering and performing treatments and interventions, pulse oximetry, re-evaluation of patient's condition and review of old charts   Patient's presentation is most consistent with acute presentation with potential threat to life or bodily function.   MEDICATIONS ORDERED IN ED: Medications  cefTRIAXone (ROCEPHIN) 2 g in sodium chloride 0.9 % 100 mL IVPB (0 g Intravenous Stopped 09/26/22 2309)  azithromycin (ZITHROMAX) 500 mg in sodium chloride 0.9 % 250 mL IVPB (500 mg Intravenous New Bag/Given 09/26/22 2310)  methylPREDNISolone sodium succinate (SOLU-MEDROL) 125 mg/2 mL injection 125 mg (125 mg Intravenous Given 09/26/22 2146)  ipratropium-albuterol (DUONEB) 0.5-2.5 (3) MG/3ML nebulizer solution 6 mL (6 mLs Nebulization Given 09/26/22 2150)  potassium chloride SA (KLOR-CON M) CR tablet  40 mEq (40 mEq Oral Given 09/26/22 2249)  ondansetron (ZOFRAN) injection 4 mg (4 mg Intravenous Given 09/26/22 2343)    FINAL CLINICAL IMPRESSION(S) / ED DIAGNOSES   Final diagnoses:  SOB (shortness of breath)  Community acquired pneumonia of right lower lobe of lung     Rx / DC Orders   ED Discharge Orders     None        Note:  This document was prepared using Dragon voice recognition software and may include unintentional dictation errors.   Corena Herter, MD 09/26/22 [phone]

## 2022-09-26 NOTE — Sepsis Progress Note (Signed)
Elink monitoring for the code sepsis protocol.  

## 2022-09-26 NOTE — Progress Notes (Signed)
Called to ER for respiratory distress patient. Patient came in on NRB. Patient wears 8LPM at home due to lung disease. Placed patient on 8L HFNC per home settings and patient is currently sating 96%.

## 2022-09-26 NOTE — ED Triage Notes (Signed)
Patient arrives by EMS from home.  EMS states that the patient is on 8 liters of oxygen at home.  Her oxygen saturation was 74% on scene.  After one albuterol treatment, her oxygen saturation went to 96%, but when the treatment was over, the saturation fell to mid 80's on 6 liters of oxygen.  Patient arrives on 8 liters by non-rebreather.

## 2022-09-27 ENCOUNTER — Encounter: Payer: Self-pay | Admitting: Family Medicine

## 2022-09-27 ENCOUNTER — Inpatient Hospital Stay: Payer: Medicaid Other

## 2022-09-27 DIAGNOSIS — R112 Nausea with vomiting, unspecified: Secondary | ICD-10-CM

## 2022-09-27 DIAGNOSIS — Z86711 Personal history of pulmonary embolism: Secondary | ICD-10-CM

## 2022-09-27 LAB — CBC
HCT: 47.4 % — ABNORMAL HIGH (ref 36.0–46.0)
Hemoglobin: 15.9 g/dL — ABNORMAL HIGH (ref 12.0–15.0)
MCH: 26.8 pg (ref 26.0–34.0)
MCHC: 33.5 g/dL (ref 30.0–36.0)
MCV: 79.9 fL — ABNORMAL LOW (ref 80.0–100.0)
Platelets: 375 10*3/uL (ref 150–400)
RBC: 5.93 MIL/uL — ABNORMAL HIGH (ref 3.87–5.11)
RDW: 20.5 % — ABNORMAL HIGH (ref 11.5–15.5)
WBC: 10.8 10*3/uL — ABNORMAL HIGH (ref 4.0–10.5)
nRBC: 1.2 % — ABNORMAL HIGH (ref 0.0–0.2)

## 2022-09-27 LAB — BLOOD CULTURE ID PANEL (REFLEXED) - BCID2
A.calcoaceticus-baumannii: NOT DETECTED
Bacteroides fragilis: NOT DETECTED
Candida albicans: NOT DETECTED
Candida auris: NOT DETECTED
Candida glabrata: NOT DETECTED
Candida krusei: NOT DETECTED
Candida parapsilosis: NOT DETECTED
Candida tropicalis: NOT DETECTED
Cryptococcus neoformans/gattii: NOT DETECTED
Enterobacter cloacae complex: NOT DETECTED
Enterobacterales: NOT DETECTED
Enterococcus Faecium: NOT DETECTED
Enterococcus faecalis: DETECTED — AB
Escherichia coli: NOT DETECTED
Haemophilus influenzae: NOT DETECTED
Klebsiella aerogenes: NOT DETECTED
Klebsiella oxytoca: NOT DETECTED
Klebsiella pneumoniae: NOT DETECTED
Listeria monocytogenes: NOT DETECTED
Neisseria meningitidis: NOT DETECTED
Proteus species: NOT DETECTED
Pseudomonas aeruginosa: NOT DETECTED
Salmonella species: NOT DETECTED
Serratia marcescens: NOT DETECTED
Staphylococcus aureus (BCID): NOT DETECTED
Staphylococcus epidermidis: NOT DETECTED
Staphylococcus lugdunensis: NOT DETECTED
Staphylococcus species: DETECTED — AB
Stenotrophomonas maltophilia: NOT DETECTED
Streptococcus agalactiae: NOT DETECTED
Streptococcus pneumoniae: NOT DETECTED
Streptococcus pyogenes: NOT DETECTED
Streptococcus species: NOT DETECTED
Vancomycin resistance: NOT DETECTED

## 2022-09-27 LAB — TROPONIN I (HIGH SENSITIVITY): Troponin I (High Sensitivity): 23 ng/L — ABNORMAL HIGH (ref <18)

## 2022-09-27 LAB — RESPIRATORY PANEL BY PCR

## 2022-09-27 LAB — BASIC METABOLIC PANEL
Anion gap: 7 (ref 5–15)
BUN: 13 mg/dL (ref 6–20)
CO2: 25 mmol/L (ref 22–32)
Calcium: 9.1 mg/dL (ref 8.9–10.3)
Chloride: 102 mmol/L (ref 98–111)
Creatinine, Ser: 1.12 mg/dL — ABNORMAL HIGH (ref 0.44–1.00)
GFR, Estimated: >60 mL/min (ref >60)
Glucose, Bld: 166 mg/dL — ABNORMAL HIGH (ref 70–99)
Potassium: 6.1 mmol/L — ABNORMAL HIGH (ref 3.5–5.1)
Sodium: 134 mmol/L — ABNORMAL LOW (ref 135–145)

## 2022-09-27 LAB — CULTURE, BLOOD (ROUTINE X 2)

## 2022-09-27 LAB — POC URINE PREG, ED: Preg Test, Ur: NEGATIVE

## 2022-09-27 LAB — PROCALCITONIN: Procalcitonin: <0.1 ng/mL

## 2022-09-27 LAB — POTASSIUM: Potassium: 4.9 mmol/L (ref 3.5–5.1)

## 2022-09-27 LAB — LACTIC ACID, PLASMA: Lactic Acid, Venous: 2 mmol/L (ref 0.5–1.9)

## 2022-09-27 MED ORDER — GUAIFENESIN ER 600 MG PO TB12
600.0000 mg | ORAL_TABLET | Freq: Two times a day (BID) | ORAL | Status: DC
  Administered 2022-09-27 – 2022-09-29 (×6): 600 mg via ORAL
  Filled 2022-09-27 (×6): qty 1

## 2022-09-27 MED ORDER — SALINE SPRAY 0.65 % NA SOLN
1.0000 | NASAL | Status: DC | PRN
  Administered 2022-09-27: 1 via NASAL
  Filled 2022-09-27: qty 44

## 2022-09-27 MED ORDER — VANCOMYCIN HCL IN DEXTROSE 1-5 GM/200ML-% IV SOLN: 1000.0000 mg | Freq: Two times a day (BID) | INTRAVENOUS | Status: DC

## 2022-09-27 MED ORDER — POTASSIUM CHLORIDE IN NACL 20-0.9 MEQ/L-% IV SOLN
INTRAVENOUS | Status: DC
  Filled 2022-09-27: qty 1000

## 2022-09-27 MED ORDER — IPRATROPIUM-ALBUTEROL 0.5-2.5 (3) MG/3ML IN SOLN
3.0000 mL | Freq: Three times a day (TID) | RESPIRATORY_TRACT | Status: DC
  Administered 2022-09-27 – 2022-09-29 (×7): 3 mL via RESPIRATORY_TRACT
  Filled 2022-09-27 (×7): qty 3

## 2022-09-27 MED ORDER — TORSEMIDE 20 MG PO TABS: 40.0000 mg | ORAL_TABLET | Freq: Two times a day (BID) | ORAL | Status: DC

## 2022-09-27 MED ORDER — SILDENAFIL CITRATE 20 MG PO TABS
20.0000 mg | ORAL_TABLET | Freq: Three times a day (TID) | ORAL | Status: DC
  Administered 2022-09-27 – 2022-09-29 (×9): 20 mg via ORAL
  Filled 2022-09-27 (×10): qty 1

## 2022-09-27 MED ORDER — TRAZODONE HCL 50 MG PO TABS
25.0000 mg | ORAL_TABLET | Freq: Every evening | ORAL | Status: DC | PRN
  Administered 2022-09-28: 25 mg via ORAL
  Filled 2022-09-27: qty 1

## 2022-09-27 MED ORDER — EMPAGLIFLOZIN 10 MG PO TABS
10.0000 mg | ORAL_TABLET | Freq: Every day | ORAL | Status: DC
  Administered 2022-09-27 – 2022-09-29 (×3): 10 mg via ORAL
  Filled 2022-09-27 (×3): qty 1

## 2022-09-27 MED ORDER — ONDANSETRON HCL 4 MG PO TABS: 4.0000 mg | ORAL_TABLET | Freq: Four times a day (QID) | ORAL | Status: DC | PRN

## 2022-09-27 MED ORDER — VANCOMYCIN HCL 2000 MG/400ML IV SOLN
2000.0000 mg | Freq: Once | INTRAVENOUS | Status: AC
  Administered 2022-09-27: 2000 mg via INTRAVENOUS
  Filled 2022-09-27: qty 400

## 2022-09-27 MED ORDER — APIXABAN 5 MG PO TABS
5.0000 mg | ORAL_TABLET | Freq: Two times a day (BID) | ORAL | Status: DC
  Administered 2022-09-27 – 2022-09-29 (×6): 5 mg via ORAL
  Filled 2022-09-27 (×6): qty 1

## 2022-09-27 MED ORDER — SPIRONOLACTONE 25 MG PO TABS: 25.0000 mg | ORAL_TABLET | Freq: Every day | ORAL | Status: DC

## 2022-09-27 MED ORDER — TREPROSTINIL 0.6 MG/ML IN SOLN: 18.0000 ug | RESPIRATORY_TRACT | Status: DC

## 2022-09-27 MED ORDER — ACETAMINOPHEN 325 MG PO TABS
650.0000 mg | ORAL_TABLET | Freq: Four times a day (QID) | ORAL | Status: DC | PRN
  Administered 2022-09-28: 650 mg via ORAL
  Filled 2022-09-27: qty 2

## 2022-09-27 MED ORDER — SODIUM CHLORIDE 0.9 % IV SOLN: 500.0000 mg | INTRAVENOUS | Status: DC

## 2022-09-27 MED ORDER — VANCOMYCIN HCL IN DEXTROSE 1-5 GM/200ML-% IV SOLN
1000.0000 mg | Freq: Two times a day (BID) | INTRAVENOUS | Status: DC
  Administered 2022-09-28 – 2022-09-29 (×3): 1000 mg via INTRAVENOUS
  Filled 2022-09-27 (×3): qty 200

## 2022-09-27 MED ORDER — ONDANSETRON HCL 4 MG/2ML IJ SOLN
4.0000 mg | Freq: Four times a day (QID) | INTRAMUSCULAR | Status: DC | PRN
  Filled 2022-09-27: qty 2

## 2022-09-27 MED ORDER — ACETAMINOPHEN 650 MG RE SUPP: 650.0000 mg | Freq: Four times a day (QID) | RECTAL | Status: DC | PRN

## 2022-09-27 MED ORDER — IOHEXOL 350 MG/ML SOLN
100.0000 mL | Freq: Once | INTRAVENOUS | Status: AC | PRN
  Administered 2022-09-27: 100 mL via INTRAVENOUS

## 2022-09-27 MED ORDER — SODIUM CHLORIDE 0.9 % IV SOLN: 2.0000 g | INTRAVENOUS | Status: DC

## 2022-09-27 MED ORDER — ENOXAPARIN SODIUM 40 MG/0.4ML IJ SOSY: 40.0000 mg | PREFILLED_SYRINGE | INTRAMUSCULAR | Status: DC

## 2022-09-27 MED ORDER — VANCOMYCIN HCL 2000 MG/400ML IV SOLN
2000.0000 mg | Freq: Once | INTRAVENOUS | Status: DC
  Filled 2022-09-27: qty 400

## 2022-09-27 MED ORDER — HYDROCOD POLI-CHLORPHE POLI ER 10-8 MG/5ML PO SUER: 5.0000 mL | Freq: Two times a day (BID) | ORAL | Status: DC | PRN

## 2022-09-27 MED ORDER — MAGNESIUM HYDROXIDE 400 MG/5ML PO SUSP: 30.0000 mL | Freq: Every day | ORAL | Status: DC | PRN

## 2022-09-27 MED ORDER — IPRATROPIUM-ALBUTEROL 0.5-2.5 (3) MG/3ML IN SOLN
3.0000 mL | Freq: Four times a day (QID) | RESPIRATORY_TRACT | Status: DC
  Administered 2022-09-27: 3 mL via RESPIRATORY_TRACT
  Filled 2022-09-27: qty 3

## 2022-09-27 MED ORDER — SODIUM CHLORIDE 0.9 % IV BOLUS
250.0000 mL | Freq: Once | INTRAVENOUS | Status: AC
  Administered 2022-09-27: 250 mL via INTRAVENOUS

## 2022-09-27 MED ORDER — FAMOTIDINE 20 MG PO TABS
20.0000 mg | ORAL_TABLET | Freq: Two times a day (BID) | ORAL | Status: DC
  Administered 2022-09-27 – 2022-09-29 (×5): 20 mg via ORAL
  Filled 2022-09-27 (×5): qty 1

## 2022-09-27 NOTE — Assessment & Plan Note (Addendum)
-   She has subsequent sepsis is manifested by tachycardia that was up to 118 initially and tachypnea with respiratory rate of 30.  She may meet severe sepsis criteria given her worsening hypoxia. - Will continue antibiotic therapy with IV Rocephin and Zithromax. - Mucolytic therapy be provided as well as duo nebs q.i.d. and q.4 hours p.r.n. - We will follow blood cultures. - This could be related to aspiration pneumonia given her recurrent nausea and vomiting.

## 2022-09-27 NOTE — Progress Notes (Signed)
Pharmacy Antibiotic Note  Yesenia Carrillo is a 27 y.o. female admitted on 09/26/2022 with bacteremia.  Pharmacy has been consulted for vancomycin dosing. Patient presents with shortness of breath. She has history of interstitial lung disease and pulmonary HTN (on inhaled tyvaso and oral revatio).  Blood cultures from 4/23 currently have GPC In 1/4 bottles with BCID detecting E faecalis and Staphylococcus species (Not S aureus or epidermidis).   - Currently on ceftriaxone and azithromycin for CAP.  -PCN allergy listed as hives and ampicillin reaction as rash but patient not sure of details for ampicillin.   -GPC now in 2/4 bottles- BCID: E faecalis and staph spp   Plan: Vancomycin 2gm IV x 1 now then 1gm IV q12h for calculated AUC of 462 (goal 400-600) Used Scr 1.12, Vd 0.5 L/kg and Adj BW to calculate Ke/CrCl Monitor renal function and check levels if remains on vancomycin >3-4 days, and clarify allergy Repeat blood cultures prior to start of antibiotics  ADDENDUM #1 UPON CLARIFICATION WITH ID PHYSICIAN WILL HOLD OFF ON VANCOMYCIN ORDERS, RECHECK BLOOD CULTURES SINCE GPC WITH TWO ORGANISMS ONLY IN ONE SET CONSISTENT WITH CONTAMINANT ADDENDUM #2:  2nd set of blood cultures now growing GPC (2 of 4 (both aerobic), different sets).  Vancomycin consult entered by ID MD. Will start Vancomycin as per above   Height:  (154.9 cm) Weight: 111 kg (244 lb 11.4 oz) IBW/kg (Calculated) : 47.8  Temp (24hrs), Avg:98 F (36.7 C), Min:97.5 F (36.4 C), Max:98.2 F (36.8 C)  Recent Labs  Lab 09/26/22 2131 09/27/22 0224 09/27/22 0820  WBC 11.8*  --  10.8*  CREATININE 0.91  --  1.12*  LATICACIDVEN 1.8 2.0*  --      Estimated Creatinine Clearance: 88.6 mL/min (A) (by C-G formula based on SCr of 1.12 mg/dL (H)).    Allergies  Allergen Reactions   Ampicillin Rash and Other (See Comments)    Patient and family cannot remember the specifics of the reaction.   Penicillins Hives    Antimicrobials  this admission: 4/23 ceftriaxone >> 4/23 azithromycin >> 4/24 vanco >>  Dose adjustments this admission:   Microbiology results: 4/23 BCx: 2/4 GPC, BCID E faecalis and staphylococcus species   Thank you for allowing pharmacy to be a part of this patient's care.  Bari Mantis PharmD Clinical Pharmacist 09/27/2022

## 2022-09-27 NOTE — Progress Notes (Signed)
CODE SEPSIS - PHARMACY COMMUNICATION  **Broad Spectrum Antibiotics should be administered within 1 hour of Sepsis diagnosis**  Time Code Sepsis Called/Page Received:  4/23 @ 2150   Antibiotics Ordered: Azithromycin , Ceftriaxone   Time of 1st antibiotic administration: Azithromycin 500 mg IV X 1 on 4/23 @ 2310   Additional action taken by pharmacy:   If necessary, Name of Provider/Nurse Contacted:     Shawnya Mayor D ,PharmD Clinical Pharmacist  09/27/2022  12:55 AM

## 2022-09-27 NOTE — ED Notes (Signed)
Patient is now on Bi-Pap

## 2022-09-27 NOTE — Progress Notes (Signed)
Called to room to place patient on bipap due to low sats on high O2. Placed patient on 12/6 @ 60% with sats of 96%. Patient breathing in the high 20s but seems to be settling her breathing down.

## 2022-09-27 NOTE — Progress Notes (Addendum)
Pharmacy Antibiotic Note  ALYNN ELLITHORPE is a 27 y.o. female admitted on 09/26/2022 with bacteremia.  Pharmacy has been consulted for vancomycin dosing. Patient presents with shortness of breath. She has history of interstitial lung disease and pulmonary HTN (on inhaled tyvaso and oral revatio).  Blood cultures from 4/23 currently have GPC In 1/4 bottles with BCID detecting E faecalis and Staphylococcus species (Not S aureus or epidermidis).  Currently on ceftriaxone and azithromycin for CAP.   PCN allergy listed as hives and ampicillin reaction as rash but patient not sure of details for ampicillin.    Plan: Vancomycin 2gm IV x 1 now then 1gm IV q12h for calculated AUC of 462 (goal 400-600) Used Scr 1.12, Vd 0.5 L/kg and Adj BW to calculate Ke/CrCl Monitor renal function and check levels if remains on vancomycin >3-4 days, and clarify allergy Repeat blood cultures prior to start of antibiotics  ADDENDUM UPON CLARIFICATION WITH ID PHYSICIAN WILL HOLD OFF ON VANCOMYCIN ORDERS, RECHECK BLOOD CULTURES SINCE GPC WITH TWO ORGANISMS ONLY IN ONE SET CONSISTENT WITH CONTAMINANT  Height:  (154.9 cm) Weight: 111 kg (244 lb 11.4 oz) IBW/kg (Calculated) : 47.8  Temp (24hrs), Avg:98 F (36.7 C), Min:97.5 F (36.4 C), Max:98.2 F (36.8 C)  Recent Labs  Lab 09/26/22 2131 09/27/22 0224 09/27/22 0820  WBC 11.8*  --  10.8*  CREATININE 0.91  --  1.12*  LATICACIDVEN 1.8 2.0*  --     Estimated Creatinine Clearance: 88.6 mL/min (A) (by C-G formula based on SCr of 1.12 mg/dL (H)).    Allergies  Allergen Reactions   Ampicillin Rash and Other (See Comments)    Patient and family cannot remember the specifics of the reaction.   Penicillins Hives    Antimicrobials this admission: 4/23 ceftriaxone >> 4/23 azithromycin >> 4/24 vanco >>  Dose adjustments this admission:   Microbiology results: 4/23 BCx: 1/4 GPC, BCID E faecalis and staphylococcus species   Thank you for allowing pharmacy to  be a part of this patient's care.  Juliette Alcide, PharmD, BCPS, BCIDP Work Cell: [phone] 09/27/2022 4:25 PM

## 2022-09-27 NOTE — ED Notes (Signed)
Respiratory called to initiate BiPap by request of Mansy, MD due to pt on 10L high flow with oxygen saturation continuing to decrease into 80's.

## 2022-09-27 NOTE — Assessment & Plan Note (Signed)
-   The patient may be having acute exacerbation/flare of her ILD. - We will place on IV steroid therapy with IV Solu-Medrol. - Mucolytic therapy and bronchodilator therapy will be provided on a scheduled and as needed basis. - We will continue her Revatio and Tyvaso for associated pulmonary hypertension

## 2022-09-27 NOTE — ED Notes (Signed)
Pt stated she would contact family to bring in Tyvaso and machine

## 2022-09-27 NOTE — Assessment & Plan Note (Signed)
-   We will continue Eliquis. 

## 2022-09-27 NOTE — Progress Notes (Signed)
PHARMACY - PHYSICIAN COMMUNICATION CRITICAL VALUE ALERT - BLOOD CULTURE IDENTIFICATION (BCID)  Yesenia Carrillo is an 27 y.o. female who presented to [facility] on 09/26/2022 with a chief complaint of shortness of breath  Assessment:  Blood cultures from 4/23 with GPC in 1 of 4 bottles, BCID detects E feacalis and Staphylococcus species (NOT S aureus, epidermidis, nor lugdunensis)   Name of physician (or Provider) Contacted: Dr Elinor Parkinson and Dr Fran Lowes  Current antibiotics: Ceftriaxone and azithromycin  Changes to prescribed antibiotics recommended:  Auto-ID consult. Due to documented allergies, add vancomycin for now  Results for orders placed or performed during the hospital encounter of 09/26/22  Blood Culture ID Panel (Reflexed) (Collected: 09/26/2022  9:31 PM)  Result Value Ref Range   Enterococcus faecalis DETECTED (A) NOT DETECTED   Enterococcus Faecium NOT DETECTED NOT DETECTED   Listeria monocytogenes NOT DETECTED NOT DETECTED   Staphylococcus species DETECTED (A) NOT DETECTED   Staphylococcus aureus (BCID) NOT DETECTED NOT DETECTED   Staphylococcus epidermidis NOT DETECTED NOT DETECTED   Staphylococcus lugdunensis NOT DETECTED NOT DETECTED   Streptococcus species NOT DETECTED NOT DETECTED   Streptococcus agalactiae NOT DETECTED NOT DETECTED   Streptococcus pneumoniae NOT DETECTED NOT DETECTED   Streptococcus pyogenes NOT DETECTED NOT DETECTED   A.calcoaceticus-baumannii NOT DETECTED NOT DETECTED   Bacteroides fragilis NOT DETECTED NOT DETECTED   Enterobacterales NOT DETECTED NOT DETECTED   Enterobacter cloacae complex NOT DETECTED NOT DETECTED   Escherichia coli NOT DETECTED NOT DETECTED   Klebsiella aerogenes NOT DETECTED NOT DETECTED   Klebsiella oxytoca NOT DETECTED NOT DETECTED   Klebsiella pneumoniae NOT DETECTED NOT DETECTED   Proteus species NOT DETECTED NOT DETECTED   Salmonella species NOT DETECTED NOT DETECTED   Serratia marcescens NOT DETECTED NOT DETECTED    Haemophilus influenzae NOT DETECTED NOT DETECTED   Neisseria meningitidis NOT DETECTED NOT DETECTED   Pseudomonas aeruginosa NOT DETECTED NOT DETECTED   Stenotrophomonas maltophilia NOT DETECTED NOT DETECTED   Candida albicans NOT DETECTED NOT DETECTED   Candida auris NOT DETECTED NOT DETECTED   Candida glabrata NOT DETECTED NOT DETECTED   Candida krusei NOT DETECTED NOT DETECTED   Candida parapsilosis NOT DETECTED NOT DETECTED   Candida tropicalis NOT DETECTED NOT DETECTED   Cryptococcus neoformans/gattii NOT DETECTED NOT DETECTED   Vancomycin resistance NOT DETECTED NOT DETECTED    Juliette Alcide, PharmD, BCPS, BCIDP Work Cell: [phone] 09/27/2022 3:15 PM

## 2022-09-27 NOTE — Assessment & Plan Note (Signed)
-   The patient will be placed on scheduled IV Reglan and as needed IV Zofran. - We will place her on IV Protonix.

## 2022-09-27 NOTE — Assessment & Plan Note (Signed)
-   We will Continue Jardiance, Aldactone as well as Demadex

## 2022-09-27 NOTE — H&P (Incomplete)
[place]   PATIENT NAME: Yesenia Carrillo    MR#:  [id number]  DATE OF BIRTH:  1996-01-21  DATE OF ADMISSION:  09/26/2022  PRIMARY CARE PHYSICIAN: [facility]   Patient is coming from: Home  REQUESTING/REFERRING PHYSICIAN: Corena Herter, MD  CHIEF COMPLAINT:   Chief Complaint  Patient presents with   Shortness of Breath    HISTORY OF PRESENT ILLNESS:  Yesenia Carrillo is a 27 y.o. African-American female with medical history significant for CHF, hypertension, interstitial lung disease and history of PE on Eliquis, who presented to the emergency room with acute onset of worsening dyspnea since tonight with associated cough and wheezing.  She admitted to recurrent nausea and vomiting that started at home.  She has been having palpitations but denied any chest pain.  No diarrhea or melena or bright red bleeding per rectum.  She denied any bilious vomitus or hematemesis.  She has not been able to expectorate much.  She was slightly somnolent in the ER.  No dysuria, oliguria or hematuria or flank pain.  ED Course: When she came to the ER, heart rate was 115 and pulse symmetry was 96 and later 90% on 8 L of O2 by high flow nasal cannula with otherwise normal vital signs.  Pulse currently later dropped to 85% and she was raised to 10% on high flow nasal cannula and later had a pulse currently of 100% on 60% FiO2.  VBG showed pH 7.44 and bicarbonate of 29.9.  CMP revealed mild hypokalemia of 3.2 and total bili was 3.9.  High sensitive troponin I was 24 and BNP was 462.9.  Lactic acid was 1.8 and CBC showed leukocytosis of 11.8 with hemoglobin of 16.1 and hematocrit of 44 and microcytosis.  INR was 2.1 and PTT of 23.  EKG as reviewed by me :  EKG showed sinus tachycardia with a rate of 116 with biatrial enlargement and low voltage QRS. Imaging: Portable chest x-ray showed low lung volumes with mild right basal atelectasis and/or infiltrates.  Abdominal and pelvic CT scan  is currently pending.  The patient was given IV Rocephin and Zithromax, DuoNeb, 125 mg IV Solu-Medrol and 40 mill Cabbell p.o. potassium chloride.  She will be admitted to a progressive unit bed for further evaluation and management. PAST MEDICAL HISTORY:   Past Medical History:  Diagnosis Date   CHF (congestive heart failure)    COVID-19 04/2019   patient reports diagnosis in Nov 2020   Hypertension    Interstitial lung disease    Morbid obesity with BMI of 40.0-44.9, adult    Pre-eclampsia   -Pulmonary embolism on Eliquis.  PAST SURGICAL HISTORY:   Past Surgical History:  Procedure Laterality Date   ECMO CANNULATION     TONSILLECTOMY     WISDOM TOOTH EXTRACTION      SOCIAL HISTORY:   Social History   Tobacco Use   Smoking status: Former    Years: 2    Types: Cigarettes    Quit date: 12/05/2015    Years since quitting: 6.8   Smokeless tobacco: Never   Tobacco comments:    2 cig/day  Substance Use Topics   Alcohol use: Not Currently    FAMILY HISTORY:   Family History  Problem Relation Age of Onset   Healthy Mother    Healthy Father     DRUG ALLERGIES:   Allergies  Allergen Reactions   Ampicillin Rash and Other (See Comments)  Patient and family cannot remember the specifics of the reaction.   Penicillins Hives    REVIEW OF SYSTEMS:   ROS As per history of present illness. All pertinent systems were reviewed above. Constitutional, HEENT, cardiovascular, respiratory, GI, GU, musculoskeletal, neuro, psychiatric, endocrine, integumentary and hematologic systems were reviewed and are otherwise negative/unremarkable except for positive findings mentioned above in the HPI.   MEDICATIONS AT HOME:   Prior to Admission medications   Medication Sig Start Date End Date Taking? Authorizing Provider  albuterol (VENTOLIN HFA) 108 (90 Base) MCG/ACT inhaler Inhale 1-2 puffs into the lungs every 6 (six) hours as needed for wheezing or shortness of breath. 07/25/21   Yes [provider]  ELIQUIS 5 MG TABS tablet Take 5 mg by mouth 2 (two) times daily.   Yes [provider]  empagliflozin (JARDIANCE) 10 MG TABS tablet Take 10 mg by mouth daily. 10/19/20  Yes [provider]  famotidine (PEPCID) 20 MG tablet Take 20 mg by mouth 2 (two) times daily. 08/30/22 10/29/22 Yes [provider]  Fluticasone-Umeclidin-Vilant (TRELEGY ELLIPTA) 200-62.5-25 MCG/ACT AEPB Inhale 1 puff into the lungs daily.   Yes [provider]  sildenafil (REVATIO) 20 MG tablet Take 20 mg by mouth 3 (three) times daily. 04/19/22  Yes [provider]  spironolactone (ALDACTONE) 25 MG tablet Take 25 mg by mouth daily.   Yes [provider]  torsemide (DEMADEX) 20 MG tablet Take 40 mg by mouth 2 (two) times daily. 05/10/22 05/10/23 Yes [provider]  TYVASO REFILL 0.6 MG/ML SOLN Inhale 18 mcg into the lungs every 4 (four) hours while awake. 11/30/21  Yes [provider]      VITAL SIGNS:  Blood pressure 100/74, pulse (!) 116, temperature 98.1 F (36.7 C), temperature source Oral, resp. rate 18, height  (1.549 m), weight 111 kg, SpO2 (!) 85 %, unknown if currently breastfeeding.  PHYSICAL EXAMINATION:  Physical Exam  GENERAL: Acutely ill 27 y.o.-year-old African-American female patient lying in the bed with mild to moderate respiratory distress on HFNC. EYES: Pupils equal, round, reactive to light and accommodation. No scleral icterus. Extraocular muscles intact.  HEENT: Head atraumatic, normocephalic. Oropharynx and nasopharynx clear.  NECK:  Supple, no jugular venous distention. No thyroid enlargement, no tenderness.  LUNGS: Diminished expiratory airflow with residual expiratory wheezes and harsh vesicular breathing as well as right basal crackles with diminished breath sounds.Marland Kitchen  CARDIOVASCULAR: Regular rate and rhythm, S1, S2 normal. No murmurs, rubs, or gallops.  ABDOMEN: Soft, nondistended, nontender.  Bowel sounds present. No organomegaly or mass.  EXTREMITIES: No pedal edema, cyanosis, or clubbing.  NEUROLOGIC: Cranial nerves II through XII are intact. Muscle strength 5/5 in all extremities. Sensation intact. Gait not checked.  PSYCHIATRIC: The patient is alert and oriented x 3.  Normal affect and good eye contact. SKIN: No obvious rash, lesion, or ulcer.   LABORATORY PANEL:   CBC Recent Labs  Lab 09/26/22 2131  WBC 11.8*  HGB 15.1*  HCT 44.0  PLT 376   ------------------------------------------------------------------------------------------------------------------  Chemistries  Recent Labs  Lab 09/26/22 2131  NA 136  K 3.2*  CL 99  CO2 23  GLUCOSE 100*  BUN 11  CREATININE 0.91  CALCIUM 8.9  AST 28  ALT 14  ALKPHOS 115  BILITOT 3.9*   ------------------------------------------------------------------------------------------------------------------  Cardiac Enzymes No results for input(s): "TROPONINI" in the last 168 hours. ------------------------------------------------------------------------------------------------------------------  RADIOLOGY:  DG Chest Port 1 View  Result Date: 09/26/2022 CLINICAL DATA:  Shortness of breath.  EXAM: PORTABLE CHEST 1 VIEW COMPARISON:  August 09, 2022 FINDINGS: The cardiac silhouette is mildly enlarged and unchanged in size. Low lung volumes are noted. Mild atelectasis and/or infiltrate is seen within the right lung base. There is no evidence of a pleural effusion or pneumothorax. The visualized skeletal structures are unremarkable. IMPRESSION: Low lung volumes with mild right basilar atelectasis and/or infiltrate. Electronically Signed   By: Aram Candela M.D.   On: 09/26/2022 21:16      IMPRESSION AND PLAN:  Assessment and Plan: * Acute on chronic respiratory failure with hypoxia - The patient will be admitted to a progressive unit bed. - The patient was still hypoxic on high flow nasal cannula. - Will have respiratory  therapy as is her for BiPAP. - We will taper BiPAP back to nasal cannula as tolerated. - Management otherwise as below.  CAP (community acquired pneumonia) - She has subsequent sepsis is manifested by tachycardia that was up to 118 initially and tachypnea with respiratory rate of 30.  She may meet severe sepsis criteria given her worsening hypoxia. - Will continue antibiotic therapy with IV Rocephin and Zithromax. - Mucolytic therapy be provided as well as duo nebs q.i.d. and q.4 hours p.r.n. - We will follow blood cultures. - This could be related to aspiration pneumonia given her recurrent nausea and vomiting.    Interstitial lung disease exacerbation - The patient may be having acute exacerbation/flare of her ILD. - We will place on IV steroid therapy with IV Solu-Medrol. - Mucolytic therapy and bronchodilator therapy will be provided on a scheduled and as needed basis. - We will continue her Revatio and Tyvaso for associated pulmonary hypertension  Intractable nausea and vomiting - The patient will be placed on scheduled IV Reglan and as needed IV Zofran. - We will place her on IV Protonix.  Chronic diastolic CHF (congestive heart failure) - We will Continue Jardiance, Aldactone as well as Demadex  History of pulmonary embolism - We will continue Eliquis.   DVT prophylaxis: Eliquis . Advanced Care Planning:  Code Status: full code. Family Communication:  The plan of care was discussed in details with the patient (and family). I answered all questions. The patient agreed to proceed with the above mentioned plan. Further management will depend upon hospital course. Disposition Plan: Back to previous home environment Consults called: none.  All the records are reviewed and case discussed with ED provider.  Status is: Inpatient  At the time of the admission, it appears that the appropriate admission status for this patient is inpatient.  This is judged to be reasonable and  necessary in order to provide the required intensity of service to ensure the patient's safety given the presenting symptoms, physical exam findings and initial radiographic and laboratory data in the context of comorbid conditions.  The patient requires inpatient status due to high intensity of service, high risk of further deterioration and high frequency of surveillance required.  I certify that at the time of admission, it is my clinical judgment that the patient will require inpatient hospital care extending more than 2 midnights.                            Dispo: The patient is from: Home              Anticipated d/c is to: Home              Patient currently is not medically  stable to d/c.              Difficult to place patient: No  Authorized and performed by: Valente David, MD Total critical care time: Approximately  45     minutes. Due to a high probability of clinically significant, life-threatening deterioration, the patient required my highest level of preparedness to intervene emergently and I personally spent this critical care time directly and personally managing the patient.  This critical care time included obtaining a history, examining the patient, pulse oximetry, ordering and review of studies, arranging urgent treatment with development of management plan, evaluation of patient's response to treatment, frequent reassessment, and discussions with other providers. This critical care time was performed to assess and manage the high probability of imminent, life-threatening deterioration that could result in multiorgan failure.  It was exclusive of separately billable procedures and treating other patients and teaching time.   Hannah Beat M.D on 09/27/2022 at 1:18 AM  [facility]   From 7 PM-7 AM, contact night-coverage [email or web address]  CC: Primary care physician; [facility]

## 2022-09-27 NOTE — Assessment & Plan Note (Addendum)
-   The patient will be admitted to a progressive unit bed. - The patient was still hypoxic on high flow nasal cannula. - Will have respiratory therapy as is her for BiPAP. - We will taper BiPAP back to nasal cannula as tolerated. - Management otherwise as below.

## 2022-09-27 NOTE — ED Notes (Addendum)
Pt's bed linen changed. Pt sitting on BSC. New gown given. Call light within reach. Pt requested to be placed on diuretic again for feeling "bloated" and "like there is fluid around my lungs". MD notified.

## 2022-09-27 NOTE — Progress Notes (Signed)
ID Brief note   Blood cx 4/23 both sets now growing GPC ( rt arm at 2131 hrs and left arm 2131 hrs) Repeat 2 sets of blood cx has been drwan already  Will consult pharmacy to start Vancomycin. H/o penicillin allergy  Full consult note to follow  Odette Fraction, MD Infectious Disease Physician [facility] [address] [place], [place] [address] Phone: [phone]  Fax: [phone]

## 2022-09-28 DIAGNOSIS — B952 Enterococcus as the cause of diseases classified elsewhere: Secondary | ICD-10-CM

## 2022-09-28 DIAGNOSIS — D72829 Elevated white blood cell count, unspecified: Secondary | ICD-10-CM | POA: Diagnosis not present

## 2022-09-28 DIAGNOSIS — R7881 Bacteremia: Secondary | ICD-10-CM

## 2022-09-28 DIAGNOSIS — J9621 Acute and chronic respiratory failure with hypoxia: Secondary | ICD-10-CM | POA: Diagnosis not present

## 2022-09-28 LAB — BASIC METABOLIC PANEL
Anion gap: 11 (ref 5–15)
BUN: 20 mg/dL (ref 6–20)
CO2: 20 mmol/L — ABNORMAL LOW (ref 22–32)
Calcium: 9.2 mg/dL (ref 8.9–10.3)
Chloride: 100 mmol/L (ref 98–111)
Creatinine, Ser: 1.15 mg/dL — ABNORMAL HIGH (ref 0.44–1.00)
GFR, Estimated: >60 mL/min (ref >60)
Glucose, Bld: 197 mg/dL — ABNORMAL HIGH (ref 70–99)
Potassium: 4.5 mmol/L (ref 3.5–5.1)
Sodium: 131 mmol/L — ABNORMAL LOW (ref 135–145)

## 2022-09-28 LAB — MAGNESIUM: Magnesium: 2.7 mg/dL — ABNORMAL HIGH (ref 1.7–2.4)

## 2022-09-28 LAB — CBC
HCT: 44.5 % (ref 36.0–46.0)
Hemoglobin: 15.2 g/dL — ABNORMAL HIGH (ref 12.0–15.0)
MCH: 27.4 pg (ref 26.0–34.0)
MCHC: 34.2 g/dL (ref 30.0–36.0)
MCV: 80.2 fL (ref 80.0–100.0)
Platelets: 363 10*3/uL (ref 150–400)
RBC: 5.55 MIL/uL — ABNORMAL HIGH (ref 3.87–5.11)
RDW: 20.2 % — ABNORMAL HIGH (ref 11.5–15.5)
WBC: 24.6 10*3/uL — ABNORMAL HIGH (ref 4.0–10.5)
nRBC: 0.4 % — ABNORMAL HIGH (ref 0.0–0.2)

## 2022-09-28 LAB — CULTURE, BLOOD (ROUTINE X 2): Special Requests: ADEQUATE

## 2022-09-28 MED ORDER — TORSEMIDE 20 MG PO TABS
40.0000 mg | ORAL_TABLET | Freq: Every day | ORAL | Status: DC
  Administered 2022-09-28 – 2022-09-29 (×2): 40 mg via ORAL
  Filled 2022-09-28 (×2): qty 2

## 2022-09-28 MED ORDER — SPIRONOLACTONE 25 MG PO TABS
25.0000 mg | ORAL_TABLET | Freq: Every day | ORAL | Status: DC
  Administered 2022-09-28 – 2022-09-29 (×2): 25 mg via ORAL
  Filled 2022-09-28 (×2): qty 1

## 2022-09-28 NOTE — Plan of Care (Signed)
  Problem: Education: Goal: Knowledge of General Education information will improve Description: Including pain rating scale, medication(s)/side effects and non-pharmacologic comfort measures Outcome: Progressing   Problem: Respiratory: Goal: Ability to maintain adequate ventilation will improve Outcome: Progressing   Problem: Clinical Measurements: Goal: Cardiovascular complication will be avoided Outcome: Progressing   Problem: Pain Managment: Goal: General experience of comfort will improve Outcome: Progressing   Problem: Safety: Goal: Ability to remain free from injury will improve Outcome: Progressing

## 2022-09-28 NOTE — Consult Note (Signed)
[facility]                                                                                        Patient Identification: Patient Name: Yesenia Carrillo MRN: [id number] Admit Date: 09/26/2022  8:21 PM Today's Date: 09/28/2022 Reason for consult: Bacteremia Requesting provider: Dr. Darlin Priestly  Principal Problem:   Acute on chronic respiratory failure with hypoxia Active Problems:   CAP (community acquired pneumonia)   Chronic diastolic CHF (congestive heart failure)   Interstitial lung disease exacerbation   History of pulmonary embolism   Intractable nausea and vomiting   Antibiotics:  Vancomycin 4/24-  Lines/Hardware:  Assessment 27 year old female with PMH of morbid obesity, pulmonary hypertension, pulmonary embolism on AC CHF, ILD with chronic hypoxic respiratory failure on 8 to 9 L home oxygen, prior history of intubation and ECMO, COVID-19 who presented to the ED 4/23 for acute onset worsening shortness of breath/wheezing.   Possible acute exacerbation of ILD in the setting of rhinovirus Less likely bacterial pneumonia given sudden onset as well as negative CTA for consolidation or airspace disease.  She is already back to her baseline oxygen requirement. Procalc < 0.1  Blood cx positive for F faecalis/Staph spp: 4/23 Drawn from 2 different sites but collected at same time; unclear if both sets were drawn from the same site.  Stability of contaminated specimen versus transient bacteremia given her GI symptoms  Penicillin allergy-see reports as history of throat closing up and needing to go to the hospital when she was a child but cannot remember the event. Would benefit from seeing allergist OP   Leukocytosis - likely steroid related and monitor   Recommendations  DC azithromycin and ceftriaxone Continue vancomycin, pharmacy to dose while waiting for repeat blood culture on 4/24 to  determine if further workup needed like TTE D/w ID pharm D  Rest of the management as per the primary team. Please call with questions or concerns.  Thank you for the consult  Odette Fraction, MD Infectious Disease Physician [facility] [address] [place], [place] [address] Phone: [phone]  Fax: [phone]  __________________________________________________________________________________________________________ HPI and Hospital Course: 28 year old female with PMH of morbid obesity, pulmonary hypertension, pulmonary embolism on AC CHF, ILD with chronic hypoxic respiratory failure on 8 to 9 L home oxygen, prior history of intubation and ECMO, COVID-19 who presented to the ED 4/23 for acute onset worsening shortness of breath/wheezing  Started feeling sick last week when multiple family members were sick with cough/congestion with symptoms worsening over the last 2-3 days. Associated symptoms include non productive cough. Denies chest pain. Denies any fever or chills.  Did have few episodes of nausea and nonbilious nonbloody vomiting and diarrhea + dizziness  on day of ED presentation. Denies GU symptoms or abdominal pain. She was on 8 L oxygen with saturation of 74% when EMS arrived.  She was given a breathing treatment with improvement of her oxygen.      At ED afebrile, tachycardic Given IV Solu-Medrol Given ceftriaxone and azithromycin for concern of pneumonia as well as p.o. potassium  Labs remarkable for potassium 3.2, total bilirubin 3.9 BNP 462.9, WBC 11.8 Negative influenza A/influenza B/SARS-CoV-2  Chest Xray with mild right basilar atelectasis and or infiltrate CT abdomen pelvis as well as CTA unremarkable  I am consulted for blood culture growing GPC  ROS: General- Denies fever, chills, loss of appetite and loss of weight HEENT - Denies blurry vision, neck pain, sinus pain Chest - Denies any chest pain,  cough CVS-  Denies any syncopal attacks, palpitations Abdomen- Denies any abdominal pain, hematochezia Neuro - Denies any weakness, numbness, tingling sensation Psych - Denies any changes in mood irritability or depressive symptoms GU- Denies any burning, dysuria, hematuria or increased frequency of urination Skin - denies any rashes/lesions MSK - denies any joint pain/swelling or restricted ROM   Past Medical History:  Diagnosis Date   CHF (congestive heart failure)    COVID-19 04/2019   patient reports diagnosis in Nov 2020   Hypertension    Interstitial lung disease    Morbid obesity with BMI of 40.0-44.9, adult    Pre-eclampsia    Past Surgical History:  Procedure Laterality Date   ECMO CANNULATION     TONSILLECTOMY     WISDOM TOOTH EXTRACTION      Scheduled Meds:  apixaban  5 mg Oral BID   empagliflozin  10 mg Oral Daily   famotidine  20 mg Oral BID   guaiFENesin  600 mg Oral BID   ipratropium-albuterol  3 mL Nebulization TID   sildenafil  20 mg Oral TID   Treprostinil  18 mcg Inhalation Q4H while awake   Continuous Infusions:  azithromycin Stopped (09/28/22 0752)   cefTRIAXone (ROCEPHIN)  IV Stopped (09/27/22 2314)   vancomycin 1,000 mg (09/28/22 0842)   PRN Meds:.acetaminophen **OR** acetaminophen, chlorpheniramine-HYDROcodone, magnesium hydroxide, ondansetron **OR** ondansetron (ZOFRAN) IV, sodium chloride, traZODone  Allergies  Allergen Reactions   Ampicillin Rash and Other (See Comments)    Patient and family cannot remember the specifics of the reaction.   Penicillins Hives   Social History   Socioeconomic History   Marital status: Single    Spouse name: Not on file   Number of children: Not on file   Years of education: Not on file   Highest education level: Not on file  Occupational History   Not on file  Tobacco Use   Smoking status: Former    Years: 2    Types: Cigarettes    Quit date: 12/05/2015    Years since quitting: 6.8   Smokeless tobacco: Never    Tobacco comments:    2 cig/day  Vaping Use   Vaping Use: Never used  Substance and Sexual Activity   Alcohol use: Not Currently   Drug use: No   Sexual activity: Not on file  Other Topics Concern   Not on file  Social History Narrative   Not on file   Social Determinants of Health   Financial Resource Strain: Not on file  Food Insecurity: No Food Insecurity (09/27/2022)   Hunger Vital Sign    Worried About Running Out of Food in the Last Year: Never true    Ran Out of Food in the Last Year: Never true  Transportation Needs: No Transportation Needs (09/27/2022)   PRAPARE - Administrator, Civil Service (Medical): No    Lack of Transportation (Non-Medical): No  Physical Activity: Not on file  Stress: Not on file  Social Connections: Not on file  Intimate Partner Violence: Not At Risk (09/27/2022)  Humiliation, Afraid, Rape, and Kick questionnaire    Fear of Current or Ex-Partner: No    Emotionally Abused: No    Physically Abused: No    Sexually Abused: No   Family History  Problem Relation Age of Onset   Healthy Mother    Healthy Father    Vitals BP 109/83 (BP Location: Right Arm)   Pulse (!) 103   Temp 97.8 F (36.6 C)   Resp 20   Ht 5\' 2"  (1.575 m)   Wt 110.5 kg   SpO2 100%   BMI 44.56 kg/m    Physical Exam Constitutional: Obese female lying in the bed, on 8 L nasal cannula    Comments:   Cardiovascular:     Rate and Rhythm: Normal rate and regular rhythm.     Heart sounds: Murmur present  Pulmonary:     Effort: Pulmonary effort is normal on nasal cannula    Comments:  bilateral equal air entry   Abdominal:     Palpations: Abdomen is soft.     Tenderness: Nondistended and nontender  Musculoskeletal:        General: No swelling or tenderness in peripheral joints  Skin:    Comments: Tattoos present  Neurological:     General: Awake, alert and oriented, grossly nonfocal  Psychiatric:        Mood and Affect: Mood normal.     Pertinent Microbiology Results for orders placed or performed during the hospital encounter of 09/26/22  Blood Culture (routine x 2)     Status: None (Preliminary result)   Collection Time: 09/26/22  9:31 PM   Specimen: BLOOD LEFT ARM  Result Value Ref Range Status   Specimen Description   Final    BLOOD LEFT ARM Performed at [facility], [address], [place], [place] [address]    Special Requests   Final    BOTTLES DRAWN AEROBIC AND ANAEROBIC Blood Culture results may not be optimal due to an inadequate volume of blood received in culture bottles Performed at [facility], [address], [place], [place] [address]    Culture  Setup Time   Final    GRAM POSITIVE COCCI AEROBIC BOTTLE ONLY Organism ID to follow CRITICAL RESULT CALLED TO, READ BACK BY AND VERIFIED WITH:  ALEX CHAPPELL 09/27/2022 CP    Culture GRAM POSITIVE COCCI  Final   Report Status PENDING  Incomplete  Blood Culture (routine x 2)     Status: None (Preliminary result)   Collection Time: 09/26/22  9:31 PM   Specimen: BLOOD  Result Value Ref Range Status   Specimen Description   Final    BLOOD BLOOD RIGHT ARM Performed at [facility], [address] [place], [place] [address]    Special Requests   Final    BOTTLES DRAWN AEROBIC AND ANAEROBIC Blood Culture adequate volume Performed at [facility], [address] [place], [place] [address]    Culture  Setup Time   Final    GRAM POSITIVE COCCI AEROBIC BOTTLE ONLY CRITICAL VALUE NOTED.  VALUE IS CONSISTENT WITH PREVIOUSLY REPORTED AND CALLED VALUE.    Culture GRAM POSITIVE COCCI  Final   Report Status PENDING  Incomplete  Blood Culture ID Panel (Reflexed)     Status: Abnormal   Collection Time: 09/26/22  9:31 PM  Result Value Ref Range Status   Enterococcus faecalis DETECTED (A) NOT DETECTED Final    Comment: CRITICAL RESULT CALLED TO, READ BACK BY AND  VERIFIED WITH:  ALEX CHAPPELL 09/27/2022 1500 CP     Enterococcus Faecium NOT DETECTED NOT DETECTED Final   Listeria monocytogenes NOT DETECTED NOT DETECTED Final   Staphylococcus species DETECTED (A) NOT DETECTED Final    Comment: CRITICAL RESULT CALLED TO, READ BACK BY AND VERIFIED WITH: ALEX CHAPPELL 09/27/2022 1500 CP    Staphylococcus aureus (BCID) NOT DETECTED NOT DETECTED Final   Staphylococcus epidermidis NOT DETECTED NOT DETECTED Final   Staphylococcus lugdunensis NOT DETECTED NOT DETECTED Final   Streptococcus species NOT DETECTED NOT DETECTED Final   Streptococcus agalactiae NOT DETECTED NOT DETECTED Final   Streptococcus pneumoniae NOT DETECTED NOT DETECTED Final   Streptococcus pyogenes NOT DETECTED NOT DETECTED Final   A.calcoaceticus-baumannii NOT DETECTED NOT DETECTED Final   Bacteroides fragilis NOT DETECTED NOT DETECTED Final   Enterobacterales NOT DETECTED NOT DETECTED Final   Enterobacter cloacae complex NOT DETECTED NOT DETECTED Final   Escherichia coli NOT DETECTED NOT DETECTED Final   Klebsiella aerogenes NOT DETECTED NOT DETECTED Final   Klebsiella oxytoca NOT DETECTED NOT DETECTED Final   Klebsiella pneumoniae NOT DETECTED NOT DETECTED Final   Proteus species NOT DETECTED NOT DETECTED Final   Salmonella species NOT DETECTED NOT DETECTED Final   Serratia marcescens NOT DETECTED NOT DETECTED Final   Haemophilus influenzae NOT DETECTED NOT DETECTED Final   Neisseria meningitidis NOT DETECTED NOT DETECTED Final   Pseudomonas aeruginosa NOT DETECTED NOT DETECTED Final   Stenotrophomonas maltophilia NOT DETECTED NOT DETECTED Final   Candida albicans NOT DETECTED NOT DETECTED Final   Candida auris NOT DETECTED NOT DETECTED Final   Candida glabrata NOT DETECTED NOT DETECTED Final   Candida krusei NOT DETECTED NOT DETECTED Final   Candida parapsilosis NOT DETECTED NOT DETECTED Final   Candida tropicalis NOT DETECTED NOT DETECTED Final   Cryptococcus neoformans/gattii NOT DETECTED NOT DETECTED Final   Vancomycin  resistance NOT DETECTED NOT DETECTED Final    Comment: Performed at [facility], [address] [place], [place] [address]  Resp Panel by RT-PCR (Flu A&B, Covid) Anterior Nasal Swab     Status: None   Collection Time: 09/26/22  9:32 PM   Specimen: Anterior Nasal Swab  Result Value Ref Range Status   SARS Coronavirus 2 by RT PCR NEGATIVE NEGATIVE Final    Comment: (NOTE) SARS-CoV-2 target nucleic acids are NOT DETECTED.  The SARS-CoV-2 RNA is generally detectable in upper respiratory specimens during the acute phase of infection. The lowest concentration of SARS-CoV-2 viral copies this assay can detect is 138 copies/mL. A negative result does not preclude SARS-Cov-2 infection and should not be used as the sole basis for treatment or other patient management decisions. A negative result may occur with  improper specimen collection/handling, submission of specimen other than nasopharyngeal swab, presence of viral mutation(s) within the areas targeted by this assay, and inadequate number of viral copies(<138 copies/mL). A negative result must be combined with clinical observations, patient history, and epidemiological information. The expected result is Negative.  Fact Sheet for Patients:  BloggerCourse.com  Fact Sheet for Healthcare Providers:  SeriousBroker.it  This test is no t yet approved or cleared by the [place] FDA and  has been authorized for detection and/or diagnosis of SARS-CoV-2 by FDA under an Emergency Use Authorization (EUA). This EUA will remain  in effect (meaning this test can be used) for the duration of the COVID-19 declaration under Section 564(b)(1) of the Act, 21 U.S.C.section 360bbb-3(b)(1), unless the authorization is terminated  or revoked sooner.  Influenza A by PCR NEGATIVE NEGATIVE Final   Influenza B by PCR NEGATIVE NEGATIVE Final    Comment: (NOTE) The Xpert Xpress  SARS-CoV-2/FLU/RSV plus assay is intended as an aid in the diagnosis of influenza from Nasopharyngeal swab specimens and should not be used as a sole basis for treatment. Nasal washings and aspirates are unacceptable for Xpert Xpress SARS-CoV-2/FLU/RSV testing.  Fact Sheet for Patients: BloggerCourse.com  Fact Sheet for Healthcare Providers: SeriousBroker.it  This test is not yet approved or cleared by the [place] FDA and has been authorized for detection and/or diagnosis of SARS-CoV-2 by FDA under an Emergency Use Authorization (EUA). This EUA will remain in effect (meaning this test can be used) for the duration of the COVID-19 declaration under Section 564(b)(1) of the Act, 21 U.S.C. section 360bbb-3(b)(1), unless the authorization is terminated or revoked.  Performed at [facility], [address] [place], [place] [address]   Respiratory (~20 pathogens) panel by PCR     Status: Abnormal   Collection Time: 09/27/22  9:52 AM   Specimen: Nasopharyngeal Swab; Respiratory  Result Value Ref Range Status   Adenovirus NOT DETECTED NOT DETECTED Final   Coronavirus 229E NOT DETECTED NOT DETECTED Final    Comment: (NOTE) The Coronavirus on the Respiratory Panel, DOES NOT test for the novel  Coronavirus (2019 nCoV)    Coronavirus HKU1 NOT DETECTED NOT DETECTED Final   Coronavirus NL63 NOT DETECTED NOT DETECTED Final   Coronavirus OC43 NOT DETECTED NOT DETECTED Final   Metapneumovirus NOT DETECTED NOT DETECTED Final   Rhinovirus / Enterovirus DETECTED (A) NOT DETECTED Final   Influenza A NOT DETECTED NOT DETECTED Final   Influenza B NOT DETECTED NOT DETECTED Final   Parainfluenza Virus 1 NOT DETECTED NOT DETECTED Final   Parainfluenza Virus 2 NOT DETECTED NOT DETECTED Final   Parainfluenza Virus 3 NOT DETECTED NOT DETECTED Final   Parainfluenza Virus 4 NOT DETECTED NOT DETECTED Final   Respiratory Syncytial Virus  NOT DETECTED NOT DETECTED Final   Bordetella pertussis NOT DETECTED NOT DETECTED Final   Bordetella Parapertussis NOT DETECTED NOT DETECTED Final   Chlamydophila pneumoniae NOT DETECTED NOT DETECTED Final   Mycoplasma pneumoniae NOT DETECTED NOT DETECTED Final    Comment: Performed at [facility], [address] [place], [place] [address]  Culture, blood (Routine X 2) w Reflex to ID Panel     Status: None (Preliminary result)   Collection Time: 09/27/22  5:21 PM   Specimen: BLOOD  Result Value Ref Range Status   Specimen Description BLOOD BLOOD RIGHT HAND  Final   Special Requests   Final    BOTTLES DRAWN AEROBIC AND ANAEROBIC Blood Culture adequate volume   Culture   Final    NO GROWTH < 12 HOURS Performed at [facility], [address] [place], [place] [address]    Report Status PENDING  Incomplete  Culture, blood (Routine X 2) w Reflex to ID Panel     Status: None (Preliminary result)   Collection Time: 09/27/22  5:22 PM   Specimen: BLOOD  Result Value Ref Range Status   Specimen Description BLOOD BLOOD RIGHT ARM  Final   Special Requests   Final    BOTTLES DRAWN AEROBIC AND ANAEROBIC Blood Culture results may not be optimal due to an inadequate volume of blood received in culture bottles   Culture   Final    NO GROWTH < 12 HOURS Performed at [facility], [address] [place], [place] [address]    Report Status PENDING  Incomplete   Pertinent Lab seen by me:    Latest Ref Rng & Units 09/28/2022    5:12 AM 09/27/2022    8:20 AM 09/26/2022    9:31 PM  CBC  WBC 4.0 - 10.5 K/uL 24.6  10.8  11.8   Hemoglobin 12.0 - 15.0 g/dL 60.4  54.0  98.1   Hematocrit 36.0 - 46.0 % 44.5  47.4  44.0   Platelets 150 - 400 K/uL 363  375  376       Latest Ref Rng & Units 09/28/2022    5:12 AM 09/27/2022    8:56 AM 09/27/2022    8:20 AM  CMP  Glucose 70 - 99 mg/dL 191   478   BUN 6 - 20 mg/dL 20   13   Creatinine 2.95 - 1.00 mg/dL 6.21   3.08   Sodium 657 -  145 mmol/L 131   134   Potassium 3.5 - 5.1 mmol/L 4.5  4.9  6.1   Chloride 98 - 111 mmol/L 100   102   CO2 22 - 32 mmol/L 20   25   Calcium 8.9 - 10.3 mg/dL 9.2   9.1      Pertinent Imagings/Other Imagings Plain films and CT images have been personally visualized and interpreted; radiology reports have been reviewed. Decision making incorporated into the Impression / Recommendations.  CT ABDOMEN PELVIS W CONTRAST  Result Date: 09/27/2022 CLINICAL DATA:  interstitial lung disease with chronic hypoxic respiratory failure on 8 to 9 L of home oxygen, HFpEF, pulmonary hypertension, pulmonary embolism on Eliquis. Abdominal pain. EXAM: CT ANGIOGRAPHY CHEST CT ABDOMEN AND PELVIS WITH CONTRAST TECHNIQUE: Multidetector CT imaging of the chest was performed using the standard protocol during bolus administration of intravenous contrast. Multiplanar CT image reconstructions and MIPs were obtained to evaluate the vascular anatomy. Multidetector CT imaging of the abdomen and pelvis was performed using the standard protocol during bolus administration of intravenous contrast. RADIATION DOSE REDUCTION: This exam was performed according to the departmental dose-optimization program which includes automated exposure control, adjustment of the mA and/or kV according to patient size and/or use of iterative reconstruction technique. CONTRAST:  OMNIPAQUE IOHEXOL 350 MG/ML SOLN COMPARISON:  Chest CTA 08/09/2022. Abdomen and pelvis CTA 11/04/2019. FINDINGS: CTA CHEST FINDINGS Cardiovascular: The heart is enlarged. Tiny pericardial effusion evident. No thoracic aortic aneurysm. Enlargement of the pulmonary outflow tract/main pulmonary arteries suggests pulmonary arterial hypertension. There is no filling defect within the opacified pulmonary arteries to suggest the presence of an acute pulmonary embolus. Reflux of contrast material into the IVC and hepatic veins is consistent with right heart dysfunction.  Mediastinum/Nodes: Mediastinal lymphadenopathy again noted. Index prevascular node measured previously at 11 mm short axis is 14 mm short axis today on image 40/4. 11 mm short axis precarinal node visible on 41/4. There is no hilar lymphadenopathy. The esophagus has normal imaging features. There is no axillary lymphadenopathy. Lungs/Pleura: Similar appearance of diffuse ground-glass opacity in both lungs. No evidence for superimposed dense consolidative airspace disease. No suspicious pulmonary nodule or mass. No pneumothorax. No pleural effusion. Musculoskeletal: No worrisome lytic or sclerotic osseous abnormality. Review of the MIP images confirms the above findings. CT ABDOMEN and PELVIS FINDINGS Hepatobiliary: Liver measures 17.6 cm craniocaudal length, enlarged. No suspicious focal abnormality within the liver parenchyma. There is diffuse gallbladder wall thickening and edema. No evidence for calcified gallstones. No intrahepatic or extrahepatic biliary dilation. Pancreas: No  focal mass lesion. No dilatation of the main duct. No intraparenchymal cyst. No peripancreatic edema. 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 Generally, splenic tissue hypo enhances. Adrenals/Urinary Tract: No adrenal nodule or mass. Kidneys unremarkable. No evidence for hydroureter. The urinary bladder appears normal for the degree of distention. Stomach/Bowel: Stomach is unremarkable. No gastric wall thickening. No evidence of outlet obstruction. Duodenum is normally positioned as is the ligament of Treitz. No small bowel wall thickening. No small bowel dilatation. Interval development of haziness/edema in the central small bowel mesentery with increased number of normal and upper normal central mesenteric lymph nodes. The terminal ileum is normal. The appendix is normal. No gross colonic mass. No colonic wall thickening. Vascular/Lymphatic: No  abdominal aortic aneurysm. No abdominal aortic atherosclerotic calcification. Borderline enlarged lymph nodes are seen in the hepatoduodenal ligament. No retroperitoneal lymphadenopathy. Increased number of central mesenteric lymph nodes evident (see above). No pelvic sidewall lymphadenopathy. Reproductive: Unremarkable. Other: No intraperitoneal free fluid. As above, there is new hazy soft tissue density/edema in the central small bowel mesentery the tracks down along the right psoas muscle into the right pelvic sidewall. Musculoskeletal: No worrisome lytic or sclerotic osseous abnormality. Review of the MIP images confirms the above findings. IMPRESSION: 1. No CT evidence for acute pulmonary embolus. 2. Enlargement of the pulmonary outflow tract/main pulmonary arteries suggests pulmonary arterial hypertension. Reflux of contrast material into the IVC and hepatic veins is consistent with right heart dysfunction. 3. Cardiomegaly with tiny pericardial effusion. 4. Similar appearance of diffuse ground-glass opacity in both lungs. Imaging features are nonspecific and may be related to pulmonary edema, chronic interstitial lung disease or atypical infection. No pleural effusion. 5. Persistent mild mediastinal lymphadenopathy, likely reactive. 6. Interval development of haziness/edema in the central small bowel mesentery with increased number of normal and upper normal central mesenteric lymph nodes. This finding may be related to infectious/inflammatory etiology including enteritis or mesenteric adenitis. These changes tracks inferiorly along the right psoas muscle down to the right pelvic sidewall. No secondary changes in the right kidney or ureter to suggest urinary source for this appearance. 7. 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 8. Hepatomegaly. Electronically Signed   By: Kennith Center M.D.   On: 09/27/2022  05:17   CT Angio Chest Pulmonary Embolism (PE) W or WO Contrast  Result Date: 09/27/2022 CLINICAL DATA:  interstitial lung disease with chronic hypoxic respiratory failure on 8 to 9 L of home oxygen, HFpEF, pulmonary hypertension, pulmonary embolism on Eliquis. Abdominal pain. EXAM: CT ANGIOGRAPHY CHEST CT ABDOMEN AND PELVIS WITH CONTRAST TECHNIQUE: Multidetector CT imaging of the chest was performed using the standard protocol during bolus administration of intravenous contrast. Multiplanar CT image reconstructions and MIPs were obtained to evaluate the vascular anatomy. Multidetector CT imaging of the abdomen and pelvis was performed using the standard protocol during bolus administration of intravenous contrast. RADIATION DOSE REDUCTION: This exam was performed according to the departmental dose-optimization program which includes automated exposure control, adjustment of the mA and/or kV according to patient size and/or use of iterative reconstruction technique. CONTRAST:  OMNIPAQUE IOHEXOL 350 MG/ML SOLN COMPARISON:  Chest CTA 08/09/2022. Abdomen and pelvis CTA 11/04/2019. FINDINGS: CTA CHEST FINDINGS Cardiovascular: The heart is enlarged. Tiny pericardial effusion evident. No thoracic aortic aneurysm. Enlargement of the pulmonary outflow tract/main pulmonary arteries suggests pulmonary arterial  hypertension. There is no filling defect within the opacified pulmonary arteries to suggest the presence of an acute pulmonary embolus. Reflux of contrast material into the IVC and hepatic veins is consistent with right heart dysfunction. Mediastinum/Nodes: Mediastinal lymphadenopathy again noted. Index prevascular node measured previously at 11 mm short axis is 14 mm short axis today on image 40/4. 11 mm short axis precarinal node visible on 41/4. There is no hilar lymphadenopathy. The esophagus has normal imaging features. There is no axillary lymphadenopathy. Lungs/Pleura: Similar appearance of diffuse  ground-glass opacity in both lungs. No evidence for superimposed dense consolidative airspace disease. No suspicious pulmonary nodule or mass. No pneumothorax. No pleural effusion. Musculoskeletal: No worrisome lytic or sclerotic osseous abnormality. Review of the MIP images confirms the above findings. CT ABDOMEN and PELVIS FINDINGS Hepatobiliary: Liver measures 17.6 cm craniocaudal length, enlarged. No suspicious focal abnormality within the liver parenchyma. There is diffuse gallbladder wall thickening and edema. No evidence for calcified gallstones. No intrahepatic or extrahepatic biliary dilation. Pancreas: No focal mass lesion. No dilatation of the main duct. No intraparenchymal cyst. No peripancreatic edema. 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 Generally, splenic tissue hypo enhances. Adrenals/Urinary Tract: No adrenal nodule or mass. Kidneys unremarkable. No evidence for hydroureter. The urinary bladder appears normal for the degree of distention. Stomach/Bowel: Stomach is unremarkable. No gastric wall thickening. No evidence of outlet obstruction. Duodenum is normally positioned as is the ligament of Treitz. No small bowel wall thickening. No small bowel dilatation. Interval development of haziness/edema in the central small bowel mesentery with increased number of normal and upper normal central mesenteric lymph nodes. The terminal ileum is normal. The appendix is normal. No gross colonic mass. No colonic wall thickening. Vascular/Lymphatic: No abdominal aortic aneurysm. No abdominal aortic atherosclerotic calcification. Borderline enlarged lymph nodes are seen in the hepatoduodenal ligament. No retroperitoneal lymphadenopathy. Increased number of central mesenteric lymph nodes evident (see above). No pelvic sidewall lymphadenopathy. Reproductive: Unremarkable. Other: No intraperitoneal free fluid. As above,  there is new hazy soft tissue density/edema in the central small bowel mesentery the tracks down along the right psoas muscle into the right pelvic sidewall. Musculoskeletal: No worrisome lytic or sclerotic osseous abnormality. Review of the MIP images confirms the above findings. IMPRESSION: 1. No CT evidence for acute pulmonary embolus. 2. Enlargement of the pulmonary outflow tract/main pulmonary arteries suggests pulmonary arterial hypertension. Reflux of contrast material into the IVC and hepatic veins is consistent with right heart dysfunction. 3. Cardiomegaly with tiny pericardial effusion. 4. Similar appearance of diffuse ground-glass opacity in both lungs. Imaging features are nonspecific and may be related to pulmonary edema, chronic interstitial lung disease or atypical infection. No pleural effusion. 5. Persistent mild mediastinal lymphadenopathy, likely reactive. 6. Interval development of haziness/edema in the central small bowel mesentery with increased number of normal and upper normal central mesenteric lymph nodes. This finding may be related to infectious/inflammatory etiology including enteritis or mesenteric adenitis. These changes tracks inferiorly along the right psoas muscle down to the right pelvic sidewall. No secondary changes in the right kidney or ureter to suggest urinary source for this appearance. 7. 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 8. Hepatomegaly. Electronically Signed   By: Kennith Center M.D.   On: 09/27/2022 05:17   DG Chest Port 1 View  Result Date: 09/26/2022 CLINICAL DATA:  Shortness of breath. EXAM: PORTABLE CHEST 1 VIEW COMPARISON:  August 09, 2022 FINDINGS: The cardiac silhouette is mildly enlarged and unchanged in size. Low lung volumes are noted. Mild atelectasis and/or infiltrate is seen within the right lung base. There is no evidence of a pleural effusion or  pneumothorax. The visualized skeletal structures are unremarkable. IMPRESSION: Low lung volumes with mild right basilar atelectasis and/or infiltrate. Electronically Signed   By: Aram Candela M.D.   On: 09/26/2022 21:16    I have personally spent 85 minutes involved in face-to-face and non-face-to-face activities for this patient on the day of the visit. Professional time spent includes the following activities: Preparing to see the patient (review of tests), Obtaining and/or reviewing separately obtained history (admission/discharge record), Performing a medically appropriate examination and/or evaluation , Ordering medications/tests/procedures, referring and communicating with other health care professionals, Documenting clinical information in the EMR, Independently interpreting results (not separately reported), Communicating results to the patient/family/caregiver, Counseling and educating the patient/family/caregiver and Care coordination (not separately reported).  Electronically signed by:   Odette Fraction, MD Infectious Disease Physician [facility] Pager: [phone]

## 2022-09-28 NOTE — Progress Notes (Signed)
Pt refused bed alarm but was educated about its importance. Will continue to monitor. 

## 2022-09-28 NOTE — TOC Progression Note (Signed)
Transition of Care [facility]) - Progression Note    Patient Details  Name: Yesenia Carrillo MRN: [id number] Date of Birth: [birth year]  Transition of Care [facility]) CM/SW Contact  Truddie Hidden, RN Phone Number: 09/28/2022, 3:02 PM  Clinical Narrative:     Transition of Care (TOC) Screening Note   Patient Details  Name: Yesenia Carrillo Date of Birth: [birth year]   Transition of Care [facility]) CM/SW Contact:    Truddie Hidden, RN Phone Number: 09/28/2022, 3:02 PM    Transition of Care Department [facility]) has reviewed patient and no TOC needs have been identified at this time. We will continue to monitor patient advancement through interdisciplinary progression rounds. If new patient transition needs arise, please place a TOC consult.          Expected Discharge Plan and Services                                               Social Determinants of Health (SDOH) Interventions SDOH Screenings   Food Insecurity: No Food Insecurity (09/27/2022)  Housing: Low Risk  (09/27/2022)  Transportation Needs: No Transportation Needs (09/27/2022)  Utilities: Not At Risk (09/27/2022)  Tobacco Use: Medium Risk (09/27/2022)    Readmission Risk Interventions     No data to display

## 2022-09-28 NOTE — Progress Notes (Signed)
  PROGRESS NOTE    Yesenia Carrillo  WUJ:[id number] DOB: 12-May-1996 DOA: 09/26/2022 [facility]CP: [facility]  [id number]/[id number]-AA  LOS: 2 days   Brief hospital course:   Assessment & Plan: Yesenia Carrillo is a 27 y.o. African-American female with medical history significant for CHF, hypertension, interstitial lung disease, prior history of intubation and ECMO, history of PE on Eliquis, who presented to the emergency room with acute onset of worsening dyspnea since tonight with associated cough and wheezing.  She admitted to recurrent nausea and vomiting that started at home.    * Acute on chronic respiratory failure with hypoxia --When EMS arrived she was hypoxic on her 8 L of oxygen to 74%.  Likely due to Rhinovirus infection. --Continue supplemental O2 to keep sats between 88-92%, wean as tolerated  CAP, ruled out --no obvious consolidation on CT chest, procal <0.1, has viral illness as more likely etiology for her respiratory failure.  Agreed with ID consult. --d/c ceftriaxone and azithromycin.  Interstitial lung disease exacerbation 2/2 Rhinoviral infection -Pt received IV solumedrol 125 mg on presentation.  Reported breathing improved with DuoNeb. --hold off further steroid for now as pt reported improvement and pt currently having bacteremia. --DuoNeb scheduled  GPC bacteremia --Blood cx positive for F faecalis/Staph spp in 2 different bottles from 2 different sites. --ID automatically consulted --repeat blood cx obtained --add IV vanc  pulmonary hypertension --continue home Revatio and Tyvaso   nausea and vomiting 2/2 Viral gastroenteritis  --supportive care  Chronic diastolic CHF (congestive heart failure) - Continue Jardiance --hold home torsemide and aldactone due to low BP  History of pulmonary embolism - continue Eliquis.   DVT prophylaxis: [id number] Code Status: Full code  Family Communication:  Level of care: Progressive Dispo:   The patient is  from: home Anticipated d/c is to: home Anticipated d/c date is: undetermined   Subjective and Interval History:  Pt reported breathing improved.   Objective: Vitals:   09/28/22 0358 09/28/22 0728 09/28/22 0842 09/28/22 1256  BP:   109/83 (!) 111/97  Pulse:   (!) 103 (!) 118  Resp:   20 18  Temp:   97.8 F (36.6 C) 97.8 F (36.6 C)  TempSrc:      SpO2:  95% 100% 100%  Weight: 110.5 kg     Height:  (1.575 m)       Intake/Output Summary (Last 24 hours) at 09/28/2022 1641 Last data filed at 09/28/2022 1425 Gross per 24 hour  Intake 1095 ml  Output 1145 ml  Net -50 ml   Filed Weights   09/26/22 2037 09/28/22 0358  Weight: 111 kg 110.5 kg    Examination:   Constitutional: NAD, AAOx3 HEENT: conjunctivae and lids normal, EOMI CV: No cyanosis.   RESP: normal respiratory effort, on 8L Neuro: II - XII grossly intact.   Psych: Normal mood and affect.  Appropriate judgement and reason   Data Reviewed: I have personally reviewed labs and imaging studies  Time spent: 50 minutes  Darlin Priestly, MD [facility] If 7PM-7AM, please contact night-coverage 09/28/2022, 4:41 PM

## 2022-09-29 DIAGNOSIS — R7881 Bacteremia: Secondary | ICD-10-CM | POA: Diagnosis not present

## 2022-09-29 DIAGNOSIS — B952 Enterococcus as the cause of diseases classified elsewhere: Secondary | ICD-10-CM | POA: Diagnosis not present

## 2022-09-29 DIAGNOSIS — J9621 Acute and chronic respiratory failure with hypoxia: Secondary | ICD-10-CM | POA: Diagnosis not present

## 2022-09-29 DIAGNOSIS — D72829 Elevated white blood cell count, unspecified: Secondary | ICD-10-CM | POA: Diagnosis not present

## 2022-09-29 LAB — CULTURE, BLOOD (ROUTINE X 2): Special Requests: ADEQUATE

## 2022-09-29 MED ORDER — IPRATROPIUM-ALBUTEROL 0.5-2.5 (3) MG/3ML IN SOLN: 3.0000 mL | Freq: Four times a day (QID) | RESPIRATORY_TRACT | 2 refills | Status: AC | PRN

## 2022-09-29 NOTE — TOC Progression Note (Signed)
Transition of Care [facility]) - Progression Note    Patient Details  Name: Yesenia Carrillo MRN: [id number] Date of Birth: [birth year]  Transition [facility] Care [facility]) CM/SW Contact  Darleene Cleaver, [place] Phone Number: 09/29/2022, 5:33 PM  Clinical Narrative:     Per Patsy Lager the nebulizer was supposed to be delivered but the driver did not deliver to the room for some reason.  TOC supervisor spoke to [place] at [employer], and spoke to his managers, and they are going to get the nebulizer delivered to patient's house this evening.  Per Morrie Sheldon he said if patient does not hear from [facility] to call [employer] [phone] first and if they can't reach her then call Melissa, [phone].  The [employer] manager has notified the driver who will verify address and deliver this evening.  Currently driver is in [place].         Expected Discharge Plan and Services         Expected Discharge Date: 09/29/22                                     Social Determinants of Health (SDOH) Interventions SDOH Screenings   Food Insecurity: No Food Insecurity (09/27/2022)  Housing: Low Risk  (09/27/2022)  Transportation Needs: No Transportation Needs (09/27/2022)  Utilities: Not At Risk (09/27/2022)  Tobacco Use: Medium Risk (09/27/2022)    Readmission Risk Interventions     No data to display

## 2022-09-29 NOTE — Progress Notes (Addendum)
[facility] Infectious Diseases Follow Up Note  Patient Identification: Patient Name: Yesenia Carrillo MRN: [id number] Admit Date: 09/26/2022  8:21 PM Age: 27 y.o.Today's Date: 09/29/2022  Reason for Visit: Bacteremia  Principal Problem:   Acute on chronic respiratory failure with hypoxia [facility]) Active Problems:   CAP (community acquired pneumonia)   Chronic diastolic CHF (congestive heart failure) (HCC)   Interstitial lung disease exacerbation   History of pulmonary embolism   Intractable nausea and vomiting  Antibiotics:  Vancomycin 4/24-   Lines/Hardware:  Interval Events: Afebrile, repeat blood culture no growth in 2 days   Assessment 27 year old female with PMH of morbid obesity, pulmonary hypertension, pulmonary embolism on AC CHF, ILD with chronic hypoxic respiratory failure on 8 to 9 L home oxygen, prior history of intubation and ECMO, COVID-19 who presented to the ED 4/23 for acute onset worsening shortness of breath/wheezing.    # Possible acute exacerbation of ILD in the setting of rhinovirus Less likely bacterial pneumonia given sudden onset as well as negative CTA for consolidation or airspace disease.  She is already back to her baseline oxygen requirement. Procalc < 0.1   # Blood cx positive for F faecalis/Staph spp: 4/23 blood collected entered from 2 different sites but collected at same time; so doubt 2 different sites. Possibility  of contaminated specimen highly likely. Repeat blood cx 4/24 ( off Vancomycin ) NG in 2 days    Penicillin allergy-see reports as history of throat closing up and needing to go to the hospital when she was a child but cannot remember the event. Would benefit from seeing allergist OP    Leukocytosis - likely steroid related and monitor   Recommendations Will DC abtx as blood cx are highly s/o contamination Monitor CBC given leukocytosis which suspect is in the setting of steroids or Fu  with PCP in a week for CBC  ID will so, please recall if needed   Rest of the management as per the primary team. Thank you for the consult. Please page with pertinent questions or concerns.  ______________________________________________________________________ Subjective patient seen and examined at the bedside. Doing well. No complaints and eager to go home   Vitals BP 97/65 (BP Location: Right Wrist)   Pulse (!) 102   Temp 98.2 F (36.8 C) (Oral)   Resp (!) 24   Ht 5\' 2"  (1.575 m)   Wt 112.7 kg   SpO2 99%   BMI 45.44 kg/m     Physical Exam Constitutional: Adult obese female lying in the bed and appears comfortable    Comments:   Cardiovascular:     Rate and Rhythm: Normal rate and regular rhythm.     Heart sounds:  Pulmonary:     Effort: Pulmonary effort is normal on 8 to 9 L nasal cannula    Comments:   Abdominal:     Palpations: Abdomen is soft.     Tenderness: Nondistended  Musculoskeletal:        General: No swelling or tenderness In peripheral joints  Skin:    Comments: No rashes  Neurological:     General: Awake, alert and oriented  Psychiatric:        Mood and Affect: Mood normal.   Pertinent Microbiology Results for orders placed or performed during the hospital encounter of 09/26/22  Blood Culture (routine x 2)     Status: None (Preliminary result)   Collection Time: 09/26/22  9:31 PM   Specimen: BLOOD LEFT ARM  Result Value Ref Range  Status   Specimen Description   Final    BLOOD LEFT ARM Performed at [facility], [address], [place], [place] [address]    Special Requests   Final    BOTTLES DRAWN AEROBIC AND ANAEROBIC Blood Culture results may not be optimal due to an inadequate volume of blood received in culture bottles Performed at [facility], [address] [place], [place] [address]    Culture  Setup Time   Final    GRAM POSITIVE COCCI AEROBIC BOTTLE ONLY Organism ID to follow CRITICAL RESULT CALLED TO,  READ BACK BY AND VERIFIED WITH:  ALEX CHAPPELL 09/27/2022 CP    Culture GRAM POSITIVE COCCI  Final   Report Status PENDING  Incomplete  Blood Culture (routine x 2)     Status: None (Preliminary result)   Collection Time: 09/26/22  9:31 PM   Specimen: BLOOD  Result Value Ref Range Status   Specimen Description   Final    BLOOD BLOOD RIGHT ARM Performed at [facility], [address] [place], [place] [address]    Special Requests   Final    BOTTLES DRAWN AEROBIC AND ANAEROBIC Blood Culture adequate volume Performed at [facility], [address] [place], [place] [address]    Culture  Setup Time   Final    GRAM POSITIVE COCCI AEROBIC BOTTLE ONLY CRITICAL VALUE NOTED.  VALUE IS CONSISTENT WITH PREVIOUSLY REPORTED AND CALLED VALUE.    Culture GRAM POSITIVE COCCI  Final   Report Status PENDING  Incomplete  Blood Culture ID Panel (Reflexed)     Status: Abnormal   Collection Time: 09/26/22  9:31 PM  Result Value Ref Range Status   Enterococcus faecalis DETECTED (A) NOT DETECTED Final    Comment: CRITICAL RESULT CALLED TO, READ BACK BY AND VERIFIED WITH:  ALEX CHAPPELL 09/27/2022 1500 CP    Enterococcus Faecium NOT DETECTED NOT DETECTED Final   Listeria monocytogenes NOT DETECTED NOT DETECTED Final   Staphylococcus species DETECTED (A) NOT DETECTED Final    Comment: CRITICAL RESULT CALLED TO, READ BACK BY AND VERIFIED WITH: ALEX CHAPPELL 09/27/2022 1500 CP    Staphylococcus aureus (BCID) NOT DETECTED NOT DETECTED Final   Staphylococcus epidermidis NOT DETECTED NOT DETECTED Final   Staphylococcus lugdunensis NOT DETECTED NOT DETECTED Final   Streptococcus species NOT DETECTED NOT DETECTED Final   Streptococcus agalactiae NOT DETECTED NOT DETECTED Final   Streptococcus pneumoniae NOT DETECTED NOT DETECTED Final   Streptococcus pyogenes NOT DETECTED NOT DETECTED Final   A.calcoaceticus-baumannii NOT DETECTED NOT DETECTED Final   Bacteroides fragilis NOT DETECTED  NOT DETECTED Final   Enterobacterales NOT DETECTED NOT DETECTED Final   Enterobacter cloacae complex NOT DETECTED NOT DETECTED Final   Escherichia coli NOT DETECTED NOT DETECTED Final   Klebsiella aerogenes NOT DETECTED NOT DETECTED Final   Klebsiella oxytoca NOT DETECTED NOT DETECTED Final   Klebsiella pneumoniae NOT DETECTED NOT DETECTED Final   Proteus species NOT DETECTED NOT DETECTED Final   Salmonella species NOT DETECTED NOT DETECTED Final   Serratia marcescens NOT DETECTED NOT DETECTED Final   Haemophilus influenzae NOT DETECTED NOT DETECTED Final   Neisseria meningitidis NOT DETECTED NOT DETECTED Final   Pseudomonas aeruginosa NOT DETECTED NOT DETECTED Final   Stenotrophomonas maltophilia NOT DETECTED NOT DETECTED Final   Candida albicans NOT DETECTED NOT DETECTED Final   Candida auris NOT DETECTED NOT DETECTED Final   Candida glabrata NOT DETECTED NOT DETECTED Final   Candida krusei NOT  DETECTED NOT DETECTED Final   Candida parapsilosis NOT DETECTED NOT DETECTED Final   Candida tropicalis NOT DETECTED NOT DETECTED Final   Cryptococcus neoformans/gattii NOT DETECTED NOT DETECTED Final   Vancomycin resistance NOT DETECTED NOT DETECTED Final    Comment: Performed at [facility], [address] [place], [place] [address]  Resp Panel by RT-PCR (Flu A&B, Covid) Anterior Nasal Swab     Status: None   Collection Time: 09/26/22  9:32 PM   Specimen: Anterior Nasal Swab  Result Value Ref Range Status   SARS Coronavirus 2 by RT PCR NEGATIVE NEGATIVE Final    Comment: (NOTE) SARS-CoV-2 target nucleic acids are NOT DETECTED.  The SARS-CoV-2 RNA is generally detectable in upper respiratory specimens during the acute phase of infection. The lowest concentration of SARS-CoV-2 viral copies this assay can detect is 138 copies/mL. A negative result does not preclude SARS-Cov-2 infection and should not be used as the sole basis for treatment or other patient management decisions.  A negative result may occur with  improper specimen collection/handling, submission of specimen other than nasopharyngeal swab, presence of viral mutation(s) within the areas targeted by this assay, and inadequate number of viral copies(<138 copies/mL). A negative result must be combined with clinical observations, patient history, and epidemiological information. The expected result is Negative.  Fact Sheet for Patients:  BloggerCourse.com  Fact Sheet for Healthcare Providers:  SeriousBroker.it  This test is no t yet approved or cleared by the [place] FDA and  has been authorized for detection and/or diagnosis of SARS-CoV-2 by FDA under an Emergency Use Authorization (EUA). This EUA will remain  in effect (meaning this test can be used) for the duration of the COVID-19 declaration under Section 564(b)(1) of the Act, 21 U.S.C.section 360bbb-3(b)(1), unless the authorization is terminated  or revoked sooner.       Influenza A by PCR NEGATIVE NEGATIVE Final   Influenza B by PCR NEGATIVE NEGATIVE Final    Comment: (NOTE) The Xpert Xpress SARS-CoV-2/FLU/RSV plus assay is intended as an aid in the diagnosis of influenza from Nasopharyngeal swab specimens and should not be used as a sole basis for treatment. Nasal washings and aspirates are unacceptable for Xpert Xpress SARS-CoV-2/FLU/RSV testing.  Fact Sheet for Patients: BloggerCourse.com  Fact Sheet for Healthcare Providers: SeriousBroker.it  This test is not yet approved or cleared by the [place] FDA and has been authorized for detection and/or diagnosis of SARS-CoV-2 by FDA under an Emergency Use Authorization (EUA). This EUA will remain in effect (meaning this test can be used) for the duration of the COVID-19 declaration under Section 564(b)(1) of the Act, 21 U.S.C. section 360bbb-3(b)(1), unless the authorization  is terminated or revoked.  Performed at [facility], [address] [place], [place] [address]   Respiratory (~20 pathogens) panel by PCR     Status: Abnormal   Collection Time: 09/27/22  9:52 AM   Specimen: Nasopharyngeal Swab; Respiratory  Result Value Ref Range Status   Adenovirus NOT DETECTED NOT DETECTED Final   Coronavirus 229E NOT DETECTED NOT DETECTED Final    Comment: (NOTE) The Coronavirus on the Respiratory Panel, DOES NOT test for the novel  Coronavirus (2019 nCoV)    Coronavirus HKU1 NOT DETECTED NOT DETECTED Final   Coronavirus NL63 NOT DETECTED NOT DETECTED Final   Coronavirus OC43 NOT DETECTED NOT DETECTED Final   Metapneumovirus NOT DETECTED NOT DETECTED Final   Rhinovirus / Enterovirus DETECTED (A) NOT DETECTED Final   Influenza A NOT  DETECTED NOT DETECTED Final   Influenza B NOT DETECTED NOT DETECTED Final   Parainfluenza Virus 1 NOT DETECTED NOT DETECTED Final   Parainfluenza Virus 2 NOT DETECTED NOT DETECTED Final   Parainfluenza Virus 3 NOT DETECTED NOT DETECTED Final   Parainfluenza Virus 4 NOT DETECTED NOT DETECTED Final   Respiratory Syncytial Virus NOT DETECTED NOT DETECTED Final   Bordetella pertussis NOT DETECTED NOT DETECTED Final   Bordetella Parapertussis NOT DETECTED NOT DETECTED Final   Chlamydophila pneumoniae NOT DETECTED NOT DETECTED Final   Mycoplasma pneumoniae NOT DETECTED NOT DETECTED Final    Comment: Performed at [facility], [address] [place], [place] [address]  Culture, blood (Routine X 2) w Reflex to ID Panel     Status: None (Preliminary result)   Collection Time: 09/27/22  5:21 PM   Specimen: BLOOD  Result Value Ref Range Status   Specimen Description BLOOD BLOOD RIGHT HAND  Final   Special Requests   Final    BOTTLES DRAWN AEROBIC AND ANAEROBIC Blood Culture adequate volume   Culture   Final    NO GROWTH 2 DAYS Performed at [facility], [address], [place], [place] [address]    Report  Status PENDING  Incomplete  Culture, blood (Routine X 2) w Reflex to ID Panel     Status: None (Preliminary result)   Collection Time: 09/27/22  5:22 PM   Specimen: BLOOD  Result Value Ref Range Status   Specimen Description BLOOD BLOOD RIGHT ARM  Final   Special Requests   Final    BOTTLES DRAWN AEROBIC AND ANAEROBIC Blood Culture results may not be optimal due to an inadequate volume of blood received in culture bottles   Culture   Final    NO GROWTH 2 DAYS Performed at [facility], [address] [place], [place] [address]    Report Status PENDING  Incomplete   Pertinent Lab.    Latest Ref Rng & Units 09/28/2022    5:12 AM 09/27/2022    8:20 AM 09/26/2022    9:31 PM  CBC  WBC 4.0 - 10.5 K/uL 24.6  10.8  11.8   Hemoglobin 12.0 - 15.0 g/dL 21.3  08.6  57.8   Hematocrit 36.0 - 46.0 % 44.5  47.4  44.0   Platelets 150 - 400 K/uL 363  375  376       Latest Ref Rng & Units 09/28/2022    5:12 AM 09/27/2022    8:56 AM 09/27/2022    8:20 AM  CMP  Glucose 70 - 99 mg/dL 469   629   BUN 6 - 20 mg/dL 20   13   Creatinine 5.28 - 1.00 mg/dL 4.13   2.44   Sodium 010 - 145 mmol/L 131   134   Potassium 3.5 - 5.1 mmol/L 4.5  4.9  6.1   Chloride 98 - 111 mmol/L 100   102   CO2 22 - 32 mmol/L 20   25   Calcium 8.9 - 10.3 mg/dL 9.2   9.1      Pertinent Imaging today Plain films and CT images have been personally visualized and interpreted; radiology reports have been reviewed. Decision making incorporated into the Impression /   No results found.  I have personally spent 36 minutes involved in face-to-face and non-face-to-face activities for this patient on the day of the visit. Professional time spent includes the following activities: Preparing to see the patient (review of tests),  Obtaining and/or reviewing separately obtained history (admission/discharge record), Performing a medically appropriate examination and/or evaluation , Ordering medications/tests/procedures, referring  and communicating with other health care professionals, Documenting clinical information in the EMR, Independently interpreting results (not separately reported), Communicating results to the patient/family/caregiver, Counseling and educating the patient/family/caregiver and Care coordination (not separately reported).   Plan d/w requesting provider as well as ID pharm D  Note: This document was prepared using dragon voice recognition software and may include unintentional dictation errors.   Electronically signed by:   Odette Fraction, MD Infectious Disease Physician [facility] Pager: [phone]

## 2022-09-29 NOTE — Progress Notes (Signed)
Pt just discharged with home 02 at [address] [place] with SO.  Pt spoke with [employer] who will deliver the neb machine to the pt's home tonight.    Pt left unit via wheelchair without incident at this time

## 2022-09-29 NOTE — Progress Notes (Signed)
DC med list, instructions, and info re: home neb use reviewed with patient who verbalized understanding.  IV removed.  DME company should be here at any moment to deliver the home nebulizer.  Pt states her SO will be here to pick her up shortly thereafter with her portable 02 tank.Marland Kitchen  Pt aware to F/U with PCP and GYN appt made already for 5/22.

## 2022-09-29 NOTE — TOC Progression Note (Addendum)
Transition of Care [facility]) - Progression Note    Patient Details  Name: Yesenia Carrillo MRN: [id number] Date of Birth: 02/14/1996  [facility] CM/SW Contact  Truddie Hidden, RN Phone Number: 09/29/2022, 11:53 AM  Clinical Narrative:    Referral for nebulizer sent to [facility] from [facility].  Per Barbara Cower patient does not qualify for a nebulizer.  Barbara Cower advised patient has interstitial  lung disease. He is checking with the office again.   1:07pm  Per Barbara Cower at [employer], the patient does not qualify for a nebulizer.  MD notified.   1:48pm Per Morrie Sheldon at [facility]. Patient qualifies for nebulizer based on post COVID dx. MD and nurse notified.  Nebulizer en route .         Expected Discharge Plan and Services         Expected Discharge Date: 09/29/22                                     Social Determinants of Health (SDOH) Interventions SDOH Screenings   Food Insecurity: No Food Insecurity (09/27/2022)  Housing: Low Risk  (09/27/2022)  Transportation Needs: No Transportation Needs (09/27/2022)  Utilities: Not At Risk (09/27/2022)  Tobacco Use: Medium Risk (09/27/2022)    Readmission Risk Interventions     No data to display

## 2022-09-29 NOTE — Discharge Summary (Signed)
Physician Discharge Summary   Yesenia Carrillo  female DOB: [birth year]  ZOX:[id number]  PCP: [facility]  Admit date: 09/26/2022 Discharge date: 09/29/2022  Admitted From: home Disposition:  home CODE STATUS: Full code   Hospital Course:  For full details, please see H&P, progress notes, consult notes and ancillary notes.  Briefly,  Yesenia Carrillo is a 27 y.o. African-American female with medical history significant for CHF, hypertension, interstitial lung disease, prior history of intubation and ECMO, history of PE on Eliquis, who presented to the emergency room with acute onset of worsening dyspnea with associated cough and wheezing.  She admitted to recurrent nausea and vomiting that started at home.    * Acute on chronic respiratory failure with hypoxia --When EMS arrived she was hypoxic on her 8 L of oxygen to 74%.  Likely due to Rhinovirus infection.  Prior to discharge, pt was back on her home 8L O2.   Interstitial lung disease exacerbation 2/2 Rhinoviral infection -Pt received IV solumedrol 125 mg on presentation.  Held off further steroid as there was questionable bacteremia, and pt's breathing improved without further steroid.   --pt reported DuoNeb helped, so pt was ordered a home neb machine to give herself DuoNeb PRN at home. --cont home Trelegy.  CAP, ruled out --no obvious consolidation on CT chest, procal <0.1, has viral illness as more likely etiology for her respiratory failure.  ID consult agreed no need to tx with abx.   GPC bacteremia, ruled out --Blood cx positive for F faecalis/Staph spp.   --ID automatically consulted --repeat blood cx obtained and was neg growth. --ID physician determined this to be a contaminant.  No need for abx.   pulmonary hypertension --continue home Revatio and Tyvaso    nausea and vomiting 2/2 Viral gastroenteritis  --supportive care   Chronic diastolic CHF (congestive heart failure) - Continue  Jardiance --home torsemide and aldactone held initially due to low BP, but resumed prior to discharge when BP improved.     History of pulmonary embolism - continue Eliquis.   Discharge Diagnoses:  Principal Problem:   Acute on chronic respiratory failure with hypoxia (HCC) Active Problems:   CAP (community acquired pneumonia)   Interstitial lung disease exacerbation   Intractable nausea and vomiting   Chronic diastolic CHF (congestive heart failure) (HCC)   History of pulmonary embolism   30 Day Unplanned Readmission Risk Score    Flowsheet Row ED to Hosp-Admission (Current) from 09/26/2022 in [name]  30 Day Unplanned Readmission Risk Score (%) 18.42 Filed at 09/29/2022 0801       This score is the patient's risk of an unplanned readmission within 30 days of being discharged (0 -100%). The score is based on dignosis, age, lab data, medications, orders, and past utilization.   Low:  0-14.9   Medium: 15-21.9   High: 22-29.9   Extreme: 30 and above         Discharge Instructions:  Allergies as of 09/29/2022       Reactions   Ampicillin Rash, Other (See Comments)   Patient and family cannot remember the specifics of the reaction.   Penicillins Hives        Medication List     TAKE these medications    albuterol 108 (90 Base) MCG/ACT inhaler Commonly known as: VENTOLIN HFA Inhale 1-2 puffs into the lungs every 6 (six) hours as needed for wheezing or shortness of breath.   Eliquis 5 MG  Tabs tablet Generic drug: apixaban Take 5 mg by mouth 2 (two) times daily.   empagliflozin 10 MG Tabs tablet Commonly known as: JARDIANCE Take 10 mg by mouth daily.   famotidine 20 MG tablet Commonly known as: PEPCID Take 20 mg by mouth 2 (two) times daily.   ipratropium-albuterol 0.5-2.5 (3) MG/3ML Soln Commonly known as: DUONEB Take 3 mLs by nebulization every 6 (six) hours as needed.   sildenafil 20 MG tablet Commonly known as: REVATIO Take  20 mg by mouth 3 (three) times daily.   spironolactone 25 MG tablet Commonly known as: ALDACTONE Take 25 mg by mouth daily.   torsemide 20 MG tablet Commonly known as: DEMADEX Take 40 mg by mouth 2 (two) times daily.   Trelegy Ellipta 200-62.5-25 MCG/ACT Aepb Generic drug: Fluticasone-Umeclidin-Vilant Inhale 1 puff into the lungs daily.   Tyvaso Refill 0.6 MG/ML Soln Generic drug: Treprostinil Inhale 18 mcg into the lungs every 4 (four) hours while awake.               Durable Medical Equipment  (From admission, onward)           Start     Ordered   09/29/22 0820  For home use only DME Nebulizer machine  Once       Question Answer Comment  Patient needs a nebulizer to treat with the following condition Interstitial lung disease (HCC)   Length of Need Lifetime      09/29/22 0819             Follow-up Information     [facility] Follow up in 1 week(s).   Specialty: General Practice Contact information: [address] [place] [address] [phone]                 Allergies  Allergen Reactions   Ampicillin Rash and Other (See Comments)    Patient and family cannot remember the specifics of the reaction.   Penicillins Hives     The results of significant diagnostics from this hospitalization (including imaging, microbiology, ancillary and laboratory) are listed below for reference.   Consultations:   Procedures/Studies: CT ABDOMEN PELVIS W CONTRAST  Result Date: 09/27/2022 CLINICAL DATA:  interstitial lung disease with chronic hypoxic respiratory failure on 8 to 9 L of home oxygen, HFpEF, pulmonary hypertension, pulmonary embolism on Eliquis. Abdominal pain. EXAM: CT ANGIOGRAPHY CHEST CT ABDOMEN AND PELVIS WITH CONTRAST TECHNIQUE: Multidetector CT imaging of the chest was performed using the standard protocol during bolus administration of intravenous contrast. Multiplanar CT image reconstructions  and MIPs were obtained to evaluate the vascular anatomy. Multidetector CT imaging of the abdomen and pelvis was performed using the standard protocol during bolus administration of intravenous contrast. RADIATION DOSE REDUCTION: This exam was performed according to the departmental dose-optimization program which includes automated exposure control, adjustment of the mA and/or kV according to patient size and/or use of iterative reconstruction technique. CONTRAST:  OMNIPAQUE IOHEXOL 350 MG/ML SOLN COMPARISON:  Chest CTA 08/09/2022. Abdomen and pelvis CTA 11/04/2019. FINDINGS: CTA CHEST FINDINGS Cardiovascular: The heart is enlarged. Tiny pericardial effusion evident. No thoracic aortic aneurysm. Enlargement of the pulmonary outflow tract/main pulmonary arteries suggests pulmonary arterial hypertension. There is no filling defect within the opacified pulmonary arteries to suggest the presence of an acute pulmonary embolus. Reflux of contrast material into the IVC and hepatic veins is consistent with right heart dysfunction. Mediastinum/Nodes: Mediastinal lymphadenopathy again noted. Index prevascular node measured previously at 11  mm short axis is 14 mm short axis today on image 40/4. 11 mm short axis precarinal node visible on 41/4. There is no hilar lymphadenopathy. The esophagus has normal imaging features. There is no axillary lymphadenopathy. Lungs/Pleura: Similar appearance of diffuse ground-glass opacity in both lungs. No evidence for superimposed dense consolidative airspace disease. No suspicious pulmonary nodule or mass. No pneumothorax. No pleural effusion. Musculoskeletal: No worrisome lytic or sclerotic osseous abnormality. Review of the MIP images confirms the above findings. CT ABDOMEN and PELVIS FINDINGS Hepatobiliary: Liver measures 17.6 cm craniocaudal length, enlarged. No suspicious focal abnormality within the liver parenchyma. There is diffuse gallbladder wall thickening and edema. No  evidence for calcified gallstones. No intrahepatic or extrahepatic biliary dilation. Pancreas: No focal mass lesion. No dilatation of the main duct. No intraparenchymal cyst. No peripancreatic edema. 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 Generally, splenic tissue hypo enhances. Adrenals/Urinary Tract: No adrenal nodule or mass. Kidneys unremarkable. No evidence for hydroureter. The urinary bladder appears normal for the degree of distention. Stomach/Bowel: Stomach is unremarkable. No gastric wall thickening. No evidence of outlet obstruction. Duodenum is normally positioned as is the ligament of Treitz. No small bowel wall thickening. No small bowel dilatation. Interval development of haziness/edema in the central small bowel mesentery with increased number of normal and upper normal central mesenteric lymph nodes. The terminal ileum is normal. The appendix is normal. No gross colonic mass. No colonic wall thickening. Vascular/Lymphatic: No abdominal aortic aneurysm. No abdominal aortic atherosclerotic calcification. Borderline enlarged lymph nodes are seen in the hepatoduodenal ligament. No retroperitoneal lymphadenopathy. Increased number of central mesenteric lymph nodes evident (see above). No pelvic sidewall lymphadenopathy. Reproductive: Unremarkable. Other: No intraperitoneal free fluid. As above, there is new hazy soft tissue density/edema in the central small bowel mesentery the tracks down along the right psoas muscle into the right pelvic sidewall. Musculoskeletal: No worrisome lytic or sclerotic osseous abnormality. Review of the MIP images confirms the above findings. IMPRESSION: 1. No CT evidence for acute pulmonary embolus. 2. Enlargement of the pulmonary outflow tract/main pulmonary arteries suggests pulmonary arterial hypertension. Reflux of contrast material into the IVC and hepatic veins is consistent  with right heart dysfunction. 3. Cardiomegaly with tiny pericardial effusion. 4. Similar appearance of diffuse ground-glass opacity in both lungs. Imaging features are nonspecific and may be related to pulmonary edema, chronic interstitial lung disease or atypical infection. No pleural effusion. 5. Persistent mild mediastinal lymphadenopathy, likely reactive. 6. Interval development of haziness/edema in the central small bowel mesentery with increased number of normal and upper normal central mesenteric lymph nodes. This finding may be related to infectious/inflammatory etiology including enteritis or mesenteric adenitis. These changes tracks inferiorly along the right psoas muscle down to the right pelvic sidewall. No secondary changes in the right kidney or ureter to suggest urinary source for this appearance. 7. 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 8. Hepatomegaly. Electronically Signed   By: Kennith Center M.D.   On: 09/27/2022 05:17   CT Angio Chest Pulmonary Embolism (PE) W or WO Contrast  Result Date: 09/27/2022 CLINICAL DATA:  interstitial lung disease with chronic hypoxic respiratory failure on 8 to 9 L of home oxygen, HFpEF, pulmonary hypertension, pulmonary embolism on Eliquis. Abdominal pain. EXAM: CT ANGIOGRAPHY CHEST CT ABDOMEN AND PELVIS WITH CONTRAST TECHNIQUE: Multidetector CT imaging of the chest  was performed using the standard protocol during bolus administration of intravenous contrast. Multiplanar CT image reconstructions and MIPs were obtained to evaluate the vascular anatomy. Multidetector CT imaging of the abdomen and pelvis was performed using the standard protocol during bolus administration of intravenous contrast. RADIATION DOSE REDUCTION: This exam was performed according to the departmental dose-optimization program which includes automated exposure control, adjustment of the mA  and/or kV according to patient size and/or use of iterative reconstruction technique. CONTRAST:  OMNIPAQUE IOHEXOL 350 MG/ML SOLN COMPARISON:  Chest CTA 08/09/2022. Abdomen and pelvis CTA 11/04/2019. FINDINGS: CTA CHEST FINDINGS Cardiovascular: The heart is enlarged. Tiny pericardial effusion evident. No thoracic aortic aneurysm. Enlargement of the pulmonary outflow tract/main pulmonary arteries suggests pulmonary arterial hypertension. There is no filling defect within the opacified pulmonary arteries to suggest the presence of an acute pulmonary embolus. Reflux of contrast material into the IVC and hepatic veins is consistent with right heart dysfunction. Mediastinum/Nodes: Mediastinal lymphadenopathy again noted. Index prevascular node measured previously at 11 mm short axis is 14 mm short axis today on image 40/4. 11 mm short axis precarinal node visible on 41/4. There is no hilar lymphadenopathy. The esophagus has normal imaging features. There is no axillary lymphadenopathy. Lungs/Pleura: Similar appearance of diffuse ground-glass opacity in both lungs. No evidence for superimposed dense consolidative airspace disease. No suspicious pulmonary nodule or mass. No pneumothorax. No pleural effusion. Musculoskeletal: No worrisome lytic or sclerotic osseous abnormality. Review of the MIP images confirms the above findings. CT ABDOMEN and PELVIS FINDINGS Hepatobiliary: Liver measures 17.6 cm craniocaudal length, enlarged. No suspicious focal abnormality within the liver parenchyma. There is diffuse gallbladder wall thickening and edema. No evidence for calcified gallstones. No intrahepatic or extrahepatic biliary dilation. Pancreas: No focal mass lesion. No dilatation of the main duct. No intraparenchymal cyst. No peripancreatic edema. 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 Generally, splenic tissue hypo enhances.  Adrenals/Urinary Tract: No adrenal nodule or mass. Kidneys unremarkable. No evidence for hydroureter. The urinary bladder appears normal for the degree of distention. Stomach/Bowel: Stomach is unremarkable. No gastric wall thickening. No evidence of outlet obstruction. Duodenum is normally positioned as is the ligament of Treitz. No small bowel wall thickening. No small bowel dilatation. Interval development of haziness/edema in the central small bowel mesentery with increased number of normal and upper normal central mesenteric lymph nodes. The terminal ileum is normal. The appendix is normal. No gross colonic mass. No colonic wall thickening. Vascular/Lymphatic: No abdominal aortic aneurysm. No abdominal aortic atherosclerotic calcification. Borderline enlarged lymph nodes are seen in the hepatoduodenal ligament. No retroperitoneal lymphadenopathy. Increased number of central mesenteric lymph nodes evident (see above). No pelvic sidewall lymphadenopathy. Reproductive: Unremarkable. Other: No intraperitoneal free fluid. As above, there is new hazy soft tissue density/edema in the central small bowel mesentery the tracks down along the right psoas muscle into the right pelvic sidewall. Musculoskeletal: No worrisome lytic or sclerotic osseous abnormality. Review of the MIP images confirms the above findings. IMPRESSION: 1. No CT evidence for acute pulmonary embolus. 2. Enlargement of the pulmonary outflow tract/main pulmonary arteries suggests pulmonary arterial hypertension. Reflux of contrast material into the IVC and hepatic veins is consistent with right heart dysfunction. 3. Cardiomegaly with tiny pericardial effusion. 4. Similar appearance of diffuse ground-glass opacity in both lungs. Imaging features are nonspecific and may be related to pulmonary edema, chronic interstitial lung disease or atypical infection. No pleural effusion. 5.  Persistent mild mediastinal lymphadenopathy, likely reactive. 6. Interval  development of haziness/edema in the central small bowel mesentery with increased number of normal and upper normal central mesenteric lymph nodes. This finding may be related to infectious/inflammatory etiology including enteritis or mesenteric adenitis. These changes tracks inferiorly along the right psoas muscle down to the right pelvic sidewall. No secondary changes in the right kidney or ureter to suggest urinary source for this appearance. 7. 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 8. Hepatomegaly. Electronically Signed   By: Kennith Center M.D.   On: 09/27/2022 05:17   DG Chest Port 1 View  Result Date: 09/26/2022 CLINICAL DATA:  Shortness of breath. EXAM: PORTABLE CHEST 1 VIEW COMPARISON:  August 09, 2022 FINDINGS: The cardiac silhouette is mildly enlarged and unchanged in size. Low lung volumes are noted. Mild atelectasis and/or infiltrate is seen within the right lung base. There is no evidence of a pleural effusion or pneumothorax. The visualized skeletal structures are unremarkable. IMPRESSION: Low lung volumes with mild right basilar atelectasis and/or infiltrate. Electronically Signed   By: Aram Candela M.D.   On: 09/26/2022 21:16      Labs: BNP (last 3 results) Recent Labs    05/23/22 0947 08/09/22 0501 09/26/22 2131  BNP 586.2* 251.2* 462.9*   Basic Metabolic Panel: Recent Labs  Lab 09/26/22 2131 09/27/22 0820 09/27/22 0856 09/28/22 0512  NA 136 134*  --  131*  K 3.2* 6.1* 4.9 4.5  CL 99 102  --  100  CO2 23 25  --  20*  GLUCOSE 100* 166*  --  197*  BUN 11 13  --  20  CREATININE 0.91 1.12*  --  1.15*  CALCIUM 8.9 9.1  --  9.2  MG  --   --   --  2.7*   Liver Function Tests: Recent Labs  Lab 09/26/22 2131  AST 28  ALT 14  ALKPHOS 115  BILITOT 3.9*  PROT 7.4  ALBUMIN 3.8   No results for input(s): "LIPASE", "AMYLASE" in the last 168 hours. No results for  input(s): "AMMONIA" in the last 168 hours. CBC: Recent Labs  Lab 09/26/22 2131 09/27/22 0820 09/28/22 0512  WBC 11.8* 10.8* 24.6*  NEUTROABS 6.8  --   --   HGB 15.1* 15.9* 15.2*  HCT 44.0 47.4* 44.5  MCV 78.7* 79.9* 80.2  PLT 376 375 363   Cardiac Enzymes: No results for input(s): "CKTOTAL", "CKMB", "CKMBINDEX", "TROPONINI" in the last 168 hours. BNP: Invalid input(s): "POCBNP" CBG: No results for input(s): "GLUCAP" in the last 168 hours. D-Dimer No results for input(s): "DDIMER" in the last 72 hours. Hgb A1c No results for input(s): "HGBA1C" in the last 72 hours. Lipid Profile No results for input(s): "CHOL", "HDL", "LDLCALC", "TRIG", "CHOLHDL", "LDLDIRECT" in the last 72 hours. Thyroid function studies No results for input(s): "TSH", "T4TOTAL", "T3FREE", "THYROIDAB" in the last 72 hours.  Invalid input(s): "FREET3" Anemia work up No results for input(s): "VITAMINB12", "FOLATE", "FERRITIN", "TIBC", "IRON", "RETICCTPCT" in the last 72 hours. Urinalysis    Component Value Date/Time   COLORURINE YELLOW (A) 05/23/2022 1022   APPEARANCEUR CLEAR (A) 05/23/2022 1022   APPEARANCEUR Clear 06/06/2013 2001   LABSPEC 1.017 05/23/2022 1022   LABSPEC 1.013 06/06/2013 2001   PHURINE 5.0 05/23/2022 1022   GLUCOSEU >=500 (A) 05/23/2022 1022   GLUCOSEU Negative 06/06/2013 2001   HGBUR LARGE (A) 05/23/2022 1022  BILIRUBINUR NEGATIVE 05/23/2022 1022   BILIRUBINUR Negative 06/06/2013 2001   KETONESUR NEGATIVE 05/23/2022 1022   PROTEINUR NEGATIVE 05/23/2022 1022   NITRITE NEGATIVE 05/23/2022 1022   LEUKOCYTESUR NEGATIVE 05/23/2022 1022   LEUKOCYTESUR Negative 06/06/2013 2001   Sepsis Labs Recent Labs  Lab 09/26/22 2131 09/27/22 0820 09/28/22 0512  WBC 11.8* 10.8* 24.6*   Microbiology Recent Results (from the past 240 hour(s))  Blood Culture (routine x 2)     Status: None (Preliminary result)   Collection Time: 09/26/22  9:31 PM   Specimen: BLOOD LEFT ARM  Result Value Ref  Range Status   Specimen Description   Final    BLOOD LEFT ARM Performed at [facility], [address] [place], [place] [address]    Special Requests   Final    BOTTLES DRAWN AEROBIC AND ANAEROBIC Blood Culture results may not be optimal due to an inadequate volume of blood received in culture bottles Performed at [facility], [address] [place], [place] [address]    Culture  Setup Time   Final    GRAM POSITIVE COCCI AEROBIC BOTTLE ONLY CRITICAL RESULT CALLED TO, READ BACK BY AND VERIFIED WITH:  ALEX CHAPPELL 09/27/2022 CP    Culture   Final    GRAM POSITIVE COCCI CULTURE REINCUBATED FOR BETTER GROWTH Performed at [facility], [address], [place], [place] [address]    Report Status PENDING  Incomplete  Blood Culture (routine x 2)     Status: Abnormal (Preliminary result)   Collection Time: 09/26/22  9:31 PM   Specimen: BLOOD  Result Value Ref Range Status   Specimen Description   Final    BLOOD BLOOD RIGHT ARM Performed at [facility], [address], [place], [place] [address]    Special Requests   Final    BOTTLES DRAWN AEROBIC AND ANAEROBIC Blood Culture adequate volume Performed at [facility], [address] [place], [place] [address]    Culture  Setup Time   Final    GRAM POSITIVE COCCI AEROBIC BOTTLE ONLY CRITICAL VALUE NOTED.  VALUE IS CONSISTENT WITH PREVIOUSLY REPORTED AND CALLED VALUE.    Culture STAPHYLOCOCCUS EPIDERMIDIS (A)  Final   Report Status PENDING  Incomplete  Blood Culture ID Panel (Reflexed)     Status: Abnormal   Collection Time: 09/26/22  9:31 PM  Result Value Ref Range Status   Enterococcus faecalis DETECTED (A) NOT DETECTED Final    Comment: CRITICAL RESULT CALLED TO, READ BACK BY AND VERIFIED WITH:  ALEX CHAPPELL 09/27/2022 1500 CP    Enterococcus Faecium NOT DETECTED NOT DETECTED Final   Listeria monocytogenes NOT DETECTED NOT DETECTED Final   Staphylococcus species DETECTED (A) NOT  DETECTED Final    Comment: CRITICAL RESULT CALLED TO, READ BACK BY AND VERIFIED WITH: ALEX CHAPPELL 09/27/2022 1500 CP    Staphylococcus aureus (BCID) NOT DETECTED NOT DETECTED Final   Staphylococcus epidermidis NOT DETECTED NOT DETECTED Final   Staphylococcus lugdunensis NOT DETECTED NOT DETECTED Final   Streptococcus species NOT DETECTED NOT DETECTED Final   Streptococcus agalactiae NOT DETECTED NOT DETECTED Final   Streptococcus pneumoniae NOT DETECTED NOT DETECTED Final   Streptococcus pyogenes NOT DETECTED NOT DETECTED Final   A.calcoaceticus-baumannii NOT DETECTED NOT DETECTED Final   Bacteroides fragilis NOT DETECTED NOT DETECTED Final   Enterobacterales NOT DETECTED NOT DETECTED Final   Enterobacter cloacae complex NOT DETECTED NOT DETECTED Final   Escherichia coli NOT DETECTED NOT DETECTED Final  Klebsiella aerogenes NOT DETECTED NOT DETECTED Final   Klebsiella oxytoca NOT DETECTED NOT DETECTED Final   Klebsiella pneumoniae NOT DETECTED NOT DETECTED Final   Proteus species NOT DETECTED NOT DETECTED Final   Salmonella species NOT DETECTED NOT DETECTED Final   Serratia marcescens NOT DETECTED NOT DETECTED Final   Haemophilus influenzae NOT DETECTED NOT DETECTED Final   Neisseria meningitidis NOT DETECTED NOT DETECTED Final   Pseudomonas aeruginosa NOT DETECTED NOT DETECTED Final   Stenotrophomonas maltophilia NOT DETECTED NOT DETECTED Final   Candida albicans NOT DETECTED NOT DETECTED Final   Candida auris NOT DETECTED NOT DETECTED Final   Candida glabrata NOT DETECTED NOT DETECTED Final   Candida krusei NOT DETECTED NOT DETECTED Final   Candida parapsilosis NOT DETECTED NOT DETECTED Final   Candida tropicalis NOT DETECTED NOT DETECTED Final   Cryptococcus neoformans/gattii NOT DETECTED NOT DETECTED Final   Vancomycin resistance NOT DETECTED NOT DETECTED Final    Comment: Performed at [facility], [address] [place], [place] [address]  Resp Panel by RT-PCR  (Flu A&B, Covid) Anterior Nasal Swab     Status: None   Collection Time: 09/26/22  9:32 PM   Specimen: Anterior Nasal Swab  Result Value Ref Range Status   SARS Coronavirus 2 by RT PCR NEGATIVE NEGATIVE Final    Comment: (NOTE) SARS-CoV-2 target nucleic acids are NOT DETECTED.  The SARS-CoV-2 RNA is generally detectable in upper respiratory specimens during the acute phase of infection. The lowest concentration of SARS-CoV-2 viral copies this assay can detect is 138 copies/mL. A negative result does not preclude SARS-Cov-2 infection and should not be used as the sole basis for treatment or other patient management decisions. A negative result may occur with  improper specimen collection/handling, submission of specimen other than nasopharyngeal swab, presence of viral mutation(s) within the areas targeted by this assay, and inadequate number of viral copies(<138 copies/mL). A negative result must be combined with clinical observations, patient history, and epidemiological information. The expected result is Negative.  Fact Sheet for Patients:  BloggerCourse.com  Fact Sheet for Healthcare Providers:  SeriousBroker.it  This test is no t yet approved or cleared by the [place] FDA and  has been authorized for detection and/or diagnosis of SARS-CoV-2 by FDA under an Emergency Use Authorization (EUA). This EUA will remain  in effect (meaning this test can be used) for the duration of the COVID-19 declaration under Section 564(b)(1) of the Act, 21 U.S.C.section 360bbb-3(b)(1), unless the authorization is terminated  or revoked sooner.       Influenza A by PCR NEGATIVE NEGATIVE Final   Influenza B by PCR NEGATIVE NEGATIVE Final    Comment: (NOTE) The Xpert Xpress SARS-CoV-2/FLU/RSV plus assay is intended as an aid in the diagnosis of influenza from Nasopharyngeal swab specimens and should not be used as a sole basis for  treatment. Nasal washings and aspirates are unacceptable for Xpert Xpress SARS-CoV-2/FLU/RSV testing.  Fact Sheet for Patients: BloggerCourse.com  Fact Sheet for Healthcare Providers: SeriousBroker.it  This test is not yet approved or cleared by the [place] FDA and has been authorized for detection and/or diagnosis of SARS-CoV-2 by FDA under an Emergency Use Authorization (EUA). This EUA will remain in effect (meaning this test can be used) for the duration of the COVID-19 declaration under Section 564(b)(1) of the Act, 21 U.S.C. section 360bbb-3(b)(1), unless the authorization is terminated or revoked.  Performed at [facility], [address], [place], [place] [address]   Respiratory (~  20 pathogens) panel by PCR     Status: Abnormal   Collection Time: 09/27/22  9:52 AM   Specimen: Nasopharyngeal Swab; Respiratory  Result Value Ref Range Status   Adenovirus NOT DETECTED NOT DETECTED Final   Coronavirus 229E NOT DETECTED NOT DETECTED Final    Comment: (NOTE) The Coronavirus on the Respiratory Panel, DOES NOT test for the novel  Coronavirus (2019 nCoV)    Coronavirus HKU1 NOT DETECTED NOT DETECTED Final   Coronavirus NL63 NOT DETECTED NOT DETECTED Final   Coronavirus OC43 NOT DETECTED NOT DETECTED Final   Metapneumovirus NOT DETECTED NOT DETECTED Final   Rhinovirus / Enterovirus DETECTED (A) NOT DETECTED Final   Influenza A NOT DETECTED NOT DETECTED Final   Influenza B NOT DETECTED NOT DETECTED Final   Parainfluenza Virus 1 NOT DETECTED NOT DETECTED Final   Parainfluenza Virus 2 NOT DETECTED NOT DETECTED Final   Parainfluenza Virus 3 NOT DETECTED NOT DETECTED Final   Parainfluenza Virus 4 NOT DETECTED NOT DETECTED Final   Respiratory Syncytial Virus NOT DETECTED NOT DETECTED Final   Bordetella pertussis NOT DETECTED NOT DETECTED Final   Bordetella Parapertussis NOT DETECTED NOT DETECTED Final   Chlamydophila  pneumoniae NOT DETECTED NOT DETECTED Final   Mycoplasma pneumoniae NOT DETECTED NOT DETECTED Final    Comment: Performed at [facility], [address] [place], [place] [address]  Culture, blood (Routine X 2) w Reflex to ID Panel     Status: None (Preliminary result)   Collection Time: 09/27/22  5:21 PM   Specimen: BLOOD  Result Value Ref Range Status   Specimen Description BLOOD BLOOD RIGHT HAND  Final   Special Requests   Final    BOTTLES DRAWN AEROBIC AND ANAEROBIC Blood Culture adequate volume   Culture   Final    NO GROWTH 2 DAYS Performed at [facility], [address], [place], [place] [address]    Report Status PENDING  Incomplete  Culture, blood (Routine X 2) w Reflex to ID Panel     Status: None (Preliminary result)   Collection Time: 09/27/22  5:22 PM   Specimen: BLOOD  Result Value Ref Range Status   Specimen Description BLOOD BLOOD RIGHT ARM  Final   Special Requests   Final    BOTTLES DRAWN AEROBIC AND ANAEROBIC Blood Culture results may not be optimal due to an inadequate volume of blood received in culture bottles   Culture   Final    NO GROWTH 2 DAYS Performed at [facility], [address] [place], [place] [address]    Report Status PENDING  Incomplete     Total time spend on discharging this patient, including the last patient exam, discussing the hospital stay, instructions for ongoing care as it relates to all pertinent caregivers, as well as preparing the medical discharge records, prescriptions, and/or referrals as applicable, is 50 minutes.    Darlin Priestly, MD  [facility] 09/29/2022, 11:14 AM

## 2022-09-30 LAB — CULTURE, BLOOD (ROUTINE X 2)

## 2022-10-01 LAB — CULTURE, BLOOD (ROUTINE X 2)

## 2022-10-02 LAB — CULTURE, BLOOD (ROUTINE X 2)
Culture: NO GROWTH
Culture: NO GROWTH

## 2022-10-09 ENCOUNTER — Ambulatory Visit: Payer: Self-pay

## 2022-10-10 ENCOUNTER — Emergency Department: Payer: Medicaid Other

## 2022-10-10 ENCOUNTER — Inpatient Hospital Stay
Admission: EM | Admit: 2022-10-10 | Discharge: 2022-10-17 | DRG: 189 | Disposition: A | Discharge status: Home / Self Care | Payer: Medicaid Other | Source: Ambulatory Visit | Attending: Internal Medicine | Admitting: Internal Medicine

## 2022-10-10 ENCOUNTER — Ambulatory Visit
Admission: EM | Admit: 2022-10-10 | Discharge: 2022-10-10 | Disposition: A | Discharge status: Home / Self Care | Payer: Medicaid Other | Attending: Emergency Medicine | Admitting: Emergency Medicine

## 2022-10-10 ENCOUNTER — Other Ambulatory Visit: Payer: Self-pay

## 2022-10-10 DIAGNOSIS — J9622 Acute and chronic respiratory failure with hypercapnia: Secondary | ICD-10-CM | POA: Diagnosis present

## 2022-10-10 DIAGNOSIS — R0902 Hypoxemia: Secondary | ICD-10-CM | POA: Diagnosis not present

## 2022-10-10 DIAGNOSIS — I2723 Pulmonary hypertension due to lung diseases and hypoxia: Secondary | ICD-10-CM | POA: Diagnosis present

## 2022-10-10 DIAGNOSIS — J9620 Acute and chronic respiratory failure, unspecified whether with hypoxia or hypercapnia: Secondary | ICD-10-CM | POA: Diagnosis present

## 2022-10-10 DIAGNOSIS — I2489 Other forms of acute ischemic heart disease: Secondary | ICD-10-CM | POA: Diagnosis present

## 2022-10-10 DIAGNOSIS — T502X5A Adverse effect of carbonic-anhydrase inhibitors, benzothiadiazides and other diuretics, initial encounter: Secondary | ICD-10-CM | POA: Diagnosis not present

## 2022-10-10 DIAGNOSIS — R6510 Systemic inflammatory response syndrome (SIRS) of non-infectious origin without acute organ dysfunction: Secondary | ICD-10-CM | POA: Diagnosis present

## 2022-10-10 DIAGNOSIS — I110 Hypertensive heart disease with heart failure: Secondary | ICD-10-CM | POA: Diagnosis present

## 2022-10-10 DIAGNOSIS — F17210 Nicotine dependence, cigarettes, uncomplicated: Secondary | ICD-10-CM | POA: Diagnosis present

## 2022-10-10 DIAGNOSIS — J849 Interstitial pulmonary disease, unspecified: Secondary | ICD-10-CM | POA: Diagnosis present

## 2022-10-10 DIAGNOSIS — I5082 Biventricular heart failure: Secondary | ICD-10-CM | POA: Diagnosis present

## 2022-10-10 DIAGNOSIS — G4733 Obstructive sleep apnea (adult) (pediatric): Secondary | ICD-10-CM | POA: Diagnosis present

## 2022-10-10 DIAGNOSIS — D573 Sickle-cell trait: Secondary | ICD-10-CM | POA: Diagnosis present

## 2022-10-10 DIAGNOSIS — Z1152 Encounter for screening for COVID-19: Secondary | ICD-10-CM

## 2022-10-10 DIAGNOSIS — J9621 Acute and chronic respiratory failure with hypoxia: Principal | ICD-10-CM

## 2022-10-10 DIAGNOSIS — Z7901 Long term (current) use of anticoagulants: Secondary | ICD-10-CM

## 2022-10-10 DIAGNOSIS — N179 Acute kidney failure, unspecified: Secondary | ICD-10-CM | POA: Diagnosis not present

## 2022-10-10 DIAGNOSIS — E872 Acidosis, unspecified: Secondary | ICD-10-CM | POA: Diagnosis present

## 2022-10-10 DIAGNOSIS — I3139 Other pericardial effusion (noninflammatory): Secondary | ICD-10-CM | POA: Diagnosis present

## 2022-10-10 DIAGNOSIS — Z86718 Personal history of other venous thrombosis and embolism: Secondary | ICD-10-CM

## 2022-10-10 DIAGNOSIS — I5033 Acute on chronic diastolic (congestive) heart failure: Secondary | ICD-10-CM

## 2022-10-10 DIAGNOSIS — R768 Other specified abnormal immunological findings in serum: Secondary | ICD-10-CM | POA: Diagnosis present

## 2022-10-10 DIAGNOSIS — Z79899 Other long term (current) drug therapy: Secondary | ICD-10-CM

## 2022-10-10 DIAGNOSIS — I272 Pulmonary hypertension, unspecified: Secondary | ICD-10-CM | POA: Diagnosis present

## 2022-10-10 DIAGNOSIS — A6004 Herpesviral vulvovaginitis: Secondary | ICD-10-CM | POA: Diagnosis present

## 2022-10-10 DIAGNOSIS — Z7951 Long term (current) use of inhaled steroids: Secondary | ICD-10-CM

## 2022-10-10 DIAGNOSIS — Z9981 Dependence on supplemental oxygen: Secondary | ICD-10-CM

## 2022-10-10 DIAGNOSIS — I2609 Other pulmonary embolism with acute cor pulmonale: Secondary | ICD-10-CM

## 2022-10-10 DIAGNOSIS — Z8616 Personal history of COVID-19: Secondary | ICD-10-CM

## 2022-10-10 DIAGNOSIS — E871 Hypo-osmolality and hyponatremia: Secondary | ICD-10-CM | POA: Diagnosis not present

## 2022-10-10 DIAGNOSIS — I2722 Pulmonary hypertension due to left heart disease: Secondary | ICD-10-CM | POA: Diagnosis present

## 2022-10-10 DIAGNOSIS — J984 Other disorders of lung: Secondary | ICD-10-CM | POA: Diagnosis present

## 2022-10-10 DIAGNOSIS — Z7984 Long term (current) use of oral hypoglycemic drugs: Secondary | ICD-10-CM

## 2022-10-10 DIAGNOSIS — E876 Hypokalemia: Secondary | ICD-10-CM | POA: Diagnosis present

## 2022-10-10 DIAGNOSIS — I2721 Secondary pulmonary arterial hypertension: Secondary | ICD-10-CM

## 2022-10-10 DIAGNOSIS — Z6841 Body Mass Index (BMI) 40.0 and over, adult: Secondary | ICD-10-CM

## 2022-10-10 DIAGNOSIS — Z86711 Personal history of pulmonary embolism: Secondary | ICD-10-CM

## 2022-10-10 DIAGNOSIS — J449 Chronic obstructive pulmonary disease, unspecified: Secondary | ICD-10-CM | POA: Diagnosis present

## 2022-10-10 DIAGNOSIS — Z880 Allergy status to penicillin: Secondary | ICD-10-CM

## 2022-10-10 DIAGNOSIS — Z8701 Personal history of pneumonia (recurrent): Secondary | ICD-10-CM

## 2022-10-10 LAB — COMPREHENSIVE METABOLIC PANEL
ALT: 18 U/L (ref 0–44)
AST: 29 U/L (ref 15–41)
Albumin: 3.6 g/dL (ref 3.5–5.0)
Alkaline Phosphatase: 109 U/L (ref 38–126)
Anion gap: 11 (ref 5–15)
BUN: 9 mg/dL (ref 6–20)
CO2: 24 mmol/L (ref 22–32)
Calcium: 8.7 mg/dL — ABNORMAL LOW (ref 8.9–10.3)
Chloride: 102 mmol/L (ref 98–111)
Creatinine, Ser: 0.92 mg/dL (ref 0.44–1.00)
GFR, Estimated: >60 mL/min (ref >60)
Glucose, Bld: 80 mg/dL (ref 70–99)
Potassium: 3.2 mmol/L — ABNORMAL LOW (ref 3.5–5.1)
Sodium: 137 mmol/L (ref 135–145)
Total Bilirubin: 5.8 mg/dL — ABNORMAL HIGH (ref 0.3–1.2)
Total Protein: 7.3 g/dL (ref 6.5–8.1)

## 2022-10-10 LAB — CBC WITH DIFFERENTIAL/PLATELET
Abs Immature Granulocytes: 0.07 10*3/uL (ref 0.00–0.07)
Basophils Absolute: 0.1 10*3/uL (ref 0.0–0.1)
Basophils Relative: 1 %
Eosinophils Absolute: 0 10*3/uL (ref 0.0–0.5)
Eosinophils Relative: 0 %
HCT: 45.3 % (ref 36.0–46.0)
Hemoglobin: 15.7 g/dL — ABNORMAL HIGH (ref 12.0–15.0)
Immature Granulocytes: 0 %
Lymphocytes Relative: 27 %
Lymphs Abs: 4.3 10*3/uL — ABNORMAL HIGH (ref 0.7–4.0)
MCH: 27.2 pg (ref 26.0–34.0)
MCHC: 34.7 g/dL (ref 30.0–36.0)
MCV: 78.4 fL — ABNORMAL LOW (ref 80.0–100.0)
Monocytes Absolute: 1.3 10*3/uL — ABNORMAL HIGH (ref 0.1–1.0)
Monocytes Relative: 8 %
Neutro Abs: 9.8 10*3/uL — ABNORMAL HIGH (ref 1.7–7.7)
Neutrophils Relative %: 64 %
Platelets: 334 10*3/uL (ref 150–400)
RBC: 5.78 MIL/uL — ABNORMAL HIGH (ref 3.87–5.11)
RDW: 20.8 % — ABNORMAL HIGH (ref 11.5–15.5)
WBC: 15.6 10*3/uL — ABNORMAL HIGH (ref 4.0–10.5)
nRBC: 0.4 % — ABNORMAL HIGH (ref 0.0–0.2)

## 2022-10-10 LAB — BLOOD GAS, VENOUS
Acid-Base Excess: 2.2 mmol/L — ABNORMAL HIGH (ref 0.0–2.0)
Bicarbonate: 29.1 mmol/L — ABNORMAL HIGH (ref 20.0–28.0)
O2 Saturation: 63.2 %
Patient temperature: 37
pCO2, Ven: 54 mmHg (ref 44–60)
pH, Ven: 7.34 (ref 7.25–7.43)
pO2, Ven: 41 mmHg (ref 32–45)

## 2022-10-10 LAB — URINALYSIS, ROUTINE W REFLEX MICROSCOPIC
Bacteria, UA: NONE SEEN
Bilirubin Urine: NEGATIVE
Glucose, UA: >=500 mg/dL — AB
Ketones, ur: NEGATIVE mg/dL
Leukocytes,Ua: NEGATIVE
Nitrite: NEGATIVE
Protein, ur: NEGATIVE mg/dL
Specific Gravity, Urine: 1.003 — ABNORMAL LOW (ref 1.005–1.030)
pH: 7 (ref 5.0–8.0)

## 2022-10-10 LAB — SARS CORONAVIRUS 2 BY RT PCR: SARS Coronavirus 2 by RT PCR: NEGATIVE

## 2022-10-10 LAB — TROPONIN I (HIGH SENSITIVITY)
Troponin I (High Sensitivity): 40 ng/L — ABNORMAL HIGH (ref <18)
Troponin I (High Sensitivity): 37 ng/L — ABNORMAL HIGH (ref <18)

## 2022-10-10 LAB — PREGNANCY, URINE: Preg Test, Ur: NEGATIVE

## 2022-10-10 LAB — LIPASE, BLOOD: Lipase: 25 U/L (ref 11–51)

## 2022-10-10 LAB — BRAIN NATRIURETIC PEPTIDE: B Natriuretic Peptide: 399.9 pg/mL — ABNORMAL HIGH (ref 0.0–100.0)

## 2022-10-10 MED ORDER — POTASSIUM CHLORIDE CRYS ER 20 MEQ PO TBCR
40.0000 meq | EXTENDED_RELEASE_TABLET | Freq: Once | ORAL | Status: AC
  Administered 2022-10-10: 40 meq via ORAL
  Filled 2022-10-10: qty 2

## 2022-10-10 MED ORDER — FUROSEMIDE 10 MG/ML IJ SOLN
40.0000 mg | Freq: Once | INTRAMUSCULAR | Status: AC
  Administered 2022-10-10: 40 mg via INTRAVENOUS
  Filled 2022-10-10: qty 4

## 2022-10-10 MED ORDER — PIPERACILLIN-TAZOBACTAM 3.375 G IVPB 30 MIN
3.3750 g | Freq: Once | INTRAVENOUS | Status: AC
  Administered 2022-10-11: 3.375 g via INTRAVENOUS
  Filled 2022-10-10: qty 50

## 2022-10-10 MED ORDER — VANCOMYCIN HCL 2000 MG/400ML IV SOLN: 2000.0000 mg | Freq: Once | INTRAVENOUS | Status: DC

## 2022-10-10 MED ORDER — HYDROMORPHONE HCL 1 MG/ML IJ SOLN
1.0000 mg | Freq: Once | INTRAMUSCULAR | Status: AC
  Administered 2022-10-10: 1 mg via INTRAVENOUS
  Filled 2022-10-10: qty 1

## 2022-10-10 MED ORDER — IPRATROPIUM-ALBUTEROL 0.5-2.5 (3) MG/3ML IN SOLN
3.0000 mL | Freq: Once | RESPIRATORY_TRACT | Status: AC
  Administered 2022-10-10: 3 mL via RESPIRATORY_TRACT

## 2022-10-10 MED ORDER — IOHEXOL 350 MG/ML SOLN
100.0000 mL | Freq: Once | INTRAVENOUS | Status: AC | PRN
  Administered 2022-10-10: 100 mL via INTRAVENOUS

## 2022-10-10 MED ORDER — DIPHENHYDRAMINE HCL 50 MG/ML IJ SOLN
25.0000 mg | Freq: Once | INTRAMUSCULAR | Status: AC
  Administered 2022-10-10: 25 mg via INTRAVENOUS
  Filled 2022-10-10: qty 1

## 2022-10-10 NOTE — ED Triage Notes (Signed)
Pt c/o HSV outbreak x3days  Pt states that she feels like her body is "struggling really hard to fight the HSV infection".   Pt states she is having weakness, abdominal pain, trouble moving, and SOB.   Pt states that it feels like the bottom of her stomach is about to fall out.

## 2022-10-10 NOTE — ED Notes (Addendum)
Respiratory called. Pt increased to 70% O2. Unable to transport to CT at this time due to unavailable RT. Pt appears to be in no respiratory distress but sats low.

## 2022-10-10 NOTE — ED Provider Notes (Signed)
Patient presents for evaluation began 3 days ago.  Has generalized weakness, shortness of breath and abdominal pain.  Describes body as" struggling really hard to fight the herpes infection".   In triage patient O2 saturation 83% on 8 L of oxygen, labored breathing on initial evaluation, lungs are diminished, placed on 10 L with O2 increasing to 87%, placed on 15 L on a nonrebreather, O2 saturations 100, per chart review during recent hospitalization patient did well with DuoNebs, given in office, EMS called to transport patient to the emergency department for further management of hypoxia  History of pneumonia, most recently 09/26/2022 with discharge on 09/29/2022, interstitial lung disease, heart failure, morbid obesity   Valinda Hoar, NP 10/10/22 1837

## 2022-10-10 NOTE — ED Triage Notes (Signed)
Arrives EMS from [facility]. Says she initially presented for HSV outbreak but feels like "her body is fighting something".   Upon assessment was found to by hypoxic in the 60-70's. Says she wears 8L [place] at home baseline for lung disease. Pt unable to elaborate on medical history.   Also c/o generalized abdominal pain.

## 2022-10-10 NOTE — ED Notes (Signed)
PT taken to scanner on nonrebreather.

## 2022-10-10 NOTE — ED Notes (Signed)
EMS called for patient transport.

## 2022-10-10 NOTE — ED Provider Notes (Signed)
[facility] Provider Note    Event Date/Time   First MD Initiated Contact with Patient 10/10/22 1909     (approximate)   History   Shortness of Breath   HPI  Yesenia Carrillo is a 27 y.o. female   past medical history of pulmonary hypertension and interstitial lung disease on 8 L at baseline, PE on Eliquis, diastolic heart failure, hypertension who presents because of low oxygen.  Patient says she has been feeling generally unwell and fatigued.  Went to urgent care today because of a genital herpes outbreak and was found to be hypoxic satting in the 80s on her baseline 8 L.  Patient does feel somewhat short of breath and has had a minimally productive cough.  Denies chest pain.  Does think her feet are somewhat swollen.  She also endorses abdominal pain that is diffuse and constant over the last 3 days.  Has had some diarrhea.  No nausea or vomiting.  Does have pain with urination due to her genital herpes outbreak.  She has noticed some discharge as well.  He is compliant with her Eliquis.     Past Medical History:  Diagnosis Date   CHF (congestive heart failure) (HCC)    COVID-19 04/2019   patient reports diagnosis in Nov 2020   Hypertension    Interstitial lung disease (HCC)    Morbid obesity with BMI of 40.0-44.9, adult [facility])    Pre-eclampsia     Patient Active Problem List   Diagnosis Date Noted   History of pulmonary embolism 09/27/2022   Intractable nausea and vomiting 09/27/2022   Demand ischemia 08/10/2022   Polycythemia secondary to hypoxia 08/10/2022   Interstitial lung disease exacerbation 08/09/2022   Hypoglycemia 08/09/2022   Syncope 08/09/2022   Influenza A 05/23/2022   Acute on chronic respiratory failure with hypoxia (HCC) 05/23/2022   Obesity, Class III, BMI 40-49.9 (morbid obesity) (HCC) 05/23/2022   Severe sepsis (HCC) 05/23/2022   ILD (interstitial lung disease) (HCC) 05/23/2022   Elevated troponin 05/23/2022   Hypokalemia  05/23/2022   Chronic diastolic CHF (congestive heart failure) (HCC) 05/23/2022   Pulmonary HTN (HCC) 05/23/2022   Pulmonary embolism (HCC) 05/23/2022   BV (bacterial vaginosis) 03/12/2020   HSV (herpes simplex virus) anogenital infection 03/12/2020   Thrombocytosis 11/13/2019   Hx pulmonary embolism 09/10/2019   Hypotension (arterial) 09/10/2019   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   Pneumonia due to COVID-19 virus 05/06/2019   COVID-19 05/04/2019   Pregnancy 05/04/2019   Acute cystitis with hematuria 05/04/2019   Sepsis due to gram-negative urinary tract infection (HCC) 11/07/2018   Sepsis (HCC) 11/05/2018   Morbid obesity with BMI of 50.0-59.9, adult (HCC) 05/14/2018   Acute respiratory failure with hypoxia (HCC)    CAP (community acquired pneumonia) 04/30/2018     Physical Exam  Triage Vital Signs: ED Triage Vitals  Enc Vitals Group     BP 10/10/22 1922 126/85     Pulse Rate 10/10/22 1918 (!) 130     Resp 10/10/22 1918 (!) 30     Temp 10/10/22 1918 99.9 F (37.7 C)     Temp src --      SpO2 10/10/22 1911 99 %     Weight 10/10/22 1919 235 lb (106.6 kg)     Height 10/10/22 1919 5\' 1"  (1.549 m)     Head Circumference --      Peak Flow --      Pain Score --  Pain Loc --      Pain Edu? --      Excl. in GC? --     Most recent vital signs: Vitals:   10/10/22 1944 10/10/22 1945  BP:    Pulse: (!) 114 (!) 114  Resp: (!) 27 (!) 27  Temp:    SpO2: 93% 95%     General: Awake, no distress.  CV:  Good peripheral perfusion.  Resp:  Normal effort.  No increased work of breathing lung sounds are clear Abd:  No distention.  Abdomen is diffusely tender mildly distended Neuro:             Awake, Alert, Oriented x 3  Other:  There is trace edema at the ankles and feet, calves are symmetric   ED Results / Procedures / Treatments  Labs (all labs ordered are listed, but only abnormal results are displayed) Labs Reviewed  BRAIN NATRIURETIC PEPTIDE - Abnormal;  Notable for the following components:      Result Value   B Natriuretic Peptide 399.9 (*)    All other components within normal limits  CBC WITH DIFFERENTIAL/PLATELET - Abnormal; Notable for the following components:   WBC 15.6 (*)    RBC 5.78 (*)    Hemoglobin 15.7 (*)    MCV 78.4 (*)    RDW 20.8 (*)    nRBC 0.4 (*)    Neutro Abs 9.8 (*)    Lymphs Abs 4.3 (*)    Monocytes Absolute 1.3 (*)    All other components within normal limits  URINALYSIS, ROUTINE W REFLEX MICROSCOPIC - Abnormal; Notable for the following components:   Color, Urine YELLOW (*)    APPearance CLEAR (*)    Specific Gravity, Urine 1.003 (*)    Glucose, UA >=500 (*)    Hgb urine dipstick SMALL (*)    All other components within normal limits  COMPREHENSIVE METABOLIC PANEL - Abnormal; Notable for the following components:   Potassium 3.2 (*)    Calcium 8.7 (*)    Total Bilirubin 5.8 (*)    All other components within normal limits  TROPONIN I (HIGH SENSITIVITY) - Abnormal; Notable for the following components:   Troponin I (High Sensitivity) 40 (*)    All other components within normal limits  TROPONIN I (HIGH SENSITIVITY) - Abnormal; Notable for the following components:   Troponin I (High Sensitivity) 37 (*)    All other components within normal limits  SARS CORONAVIRUS 2 BY RT PCR  WET PREP, GENITAL  CHLAMYDIA/NGC RT PCR ([facility] ONLY)            PREGNANCY, URINE  LIPASE, BLOOD  HIV ANTIBODY (ROUTINE TESTING W REFLEX)  POC URINE PREG, ED     EKG EKG reviewed interpreted myself shows sinus tachycardia with RVH no acute ischemic changes similar to prior EKG   RADIOLOGY I reviewed and interpreted the chest x-ray which shows cardiomegaly without other acute process   PROCEDURES:  Critical Care performed: Yes, see critical care procedure note(s)  Procedures  The patient is on the cardiac monitor to evaluate for evidence of arrhythmia and/or significant heart rate changes.   MEDICATIONS ORDERED  IN ED: Medications  potassium chloride SA (KLOR-CON M) CR tablet 40 mEq (has no administration in time range)  furosemide (LASIX) injection 40 mg (has no administration in time range)  iohexol (OMNIPAQUE) 350 MG/ML injection 100 mL (has no administration in time range)  HYDROmorphone (DILAUDID) injection 1 mg (1 mg Intravenous Given 10/10/22 2045)  IMPRESSION / MDM / ASSESSMENT AND PLAN / ED COURSE  I reviewed the triage vital signs and the nursing notes.                              Patient's presentation is most consistent with acute presentation with potential threat to life or bodily function.  Differential diagnosis includes, but is not limited to, ILD exacerbation, CHF, pneumonia, PE, exacerbation of pulmonary hypertension  The patient is a 27 year old female with multiple medical comorbidities including pulmonary hypertension ILD on 8 L nasal cannula at baseline who presents because of hypoxia.  Patient has been feeling weak and fatigued over the last several days went to urgent care actually because she thought she was having a HSV outbreak.  Was found to be hypoxic on her 8 L.  Initial O2 sat on 8 L in ED is 68%.  Patient placed on nonrebreather with improvement in sats and then transition to 40 L 60% high flow.    Patient has had some cough that is minimally productive and does feel like her ankles are somewhat swollen.  Denies fevers chills or hemoptysis.  She is compliant with her Eliquis.  She endorses significant abdominal pain as well over the last several days as well as diarrhea but no vomiting.  She has urinary symptoms thinks this is related to the HSV.  On exam patient looks chronically ill but work of breathing is actually not significant.  Lung sounds are clear.  Does have some pitting edema in the ankles.  She is diffusely tender in the abdomen.  Chest x-ray shows cardiomegaly but no other significant infiltrate or edema.  Labs are notable for leukocytosis of 16.  CMP  shows elevated bilirubin but otherwise is reassuring.  BNP and troponin are mildly elevated.  Pregnancy test is negative.  COVID was negative.  Given unclear cause of hypoxia at this time will obtain CT angio although less likely to be PE given she is anticoagulated.  Will also scan the abdomen given her significant abdominal pain but this could just be due to hepatic congestion.  Will give a dose of IV Lasix and p.o. potassium.   FINAL CLINICAL IMPRESSION(S) / ED DIAGNOSES   Final diagnoses:  Acute on chronic respiratory failure with hypoxia and hypercapnia (HCC)     Rx / DC Orders   ED Discharge Orders     None        Note:  This document was prepared using Dragon voice recognition software and may include unintentional dictation errors.   Georga Hacking, MD 10/10/22 2203

## 2022-10-10 NOTE — Progress Notes (Signed)
Patient placed on HHFNC 40L and 60%, SpO2 95%. PT tolerating well.

## 2022-10-10 NOTE — ED Triage Notes (Addendum)
Pt o2 stat was at 83% at 8L and pt requested to be switched to our oxygen as she only has 1 tank of oxygen left. Pt was moved to our oxygen and placed at 8L and o2 dropped to 69%. O2 moved up to 15L and Provider notified. O2 is at 97 on 15L

## 2022-10-10 NOTE — ED Notes (Signed)
Patient is being discharged from the Urgent Care and sent to the Emergency Department via EMS . Per Hansel Starling, NP, patient is in need of higher level of care due to Hypoxia. Patient is aware and verbalizes understanding of plan of care.  Vitals:   10/10/22 1759  BP: 115/72  Pulse: (!) 119  Temp: 97.7 F (36.5 C)  SpO2: (!) 83%

## 2022-10-10 NOTE — ED Notes (Signed)
Fan given for comfort

## 2022-10-11 ENCOUNTER — Inpatient Hospital Stay (HOSPITAL_COMMUNITY)
Admit: 2022-10-11 | Discharge: 2022-10-11 | Disposition: A | Discharge status: Home / Self Care | Payer: Medicaid Other | Attending: Pulmonary Disease | Admitting: Pulmonary Disease

## 2022-10-11 ENCOUNTER — Emergency Department: Payer: Medicaid Other

## 2022-10-11 DIAGNOSIS — J984 Other disorders of lung: Secondary | ICD-10-CM | POA: Diagnosis present

## 2022-10-11 DIAGNOSIS — I5082 Biventricular heart failure: Secondary | ICD-10-CM | POA: Diagnosis present

## 2022-10-11 DIAGNOSIS — I3139 Other pericardial effusion (noninflammatory): Secondary | ICD-10-CM

## 2022-10-11 DIAGNOSIS — J9621 Acute and chronic respiratory failure with hypoxia: Secondary | ICD-10-CM | POA: Diagnosis present

## 2022-10-11 DIAGNOSIS — I50810 Right heart failure, unspecified: Secondary | ICD-10-CM | POA: Diagnosis not present

## 2022-10-11 DIAGNOSIS — I2721 Secondary pulmonary arterial hypertension: Secondary | ICD-10-CM | POA: Diagnosis present

## 2022-10-11 DIAGNOSIS — I272 Pulmonary hypertension, unspecified: Secondary | ICD-10-CM | POA: Diagnosis not present

## 2022-10-11 DIAGNOSIS — Z6841 Body Mass Index (BMI) 40.0 and over, adult: Secondary | ICD-10-CM | POA: Diagnosis not present

## 2022-10-11 DIAGNOSIS — I2609 Other pulmonary embolism with acute cor pulmonale: Secondary | ICD-10-CM | POA: Diagnosis not present

## 2022-10-11 DIAGNOSIS — A6004 Herpesviral vulvovaginitis: Secondary | ICD-10-CM | POA: Diagnosis not present

## 2022-10-11 DIAGNOSIS — I5033 Acute on chronic diastolic (congestive) heart failure: Secondary | ICD-10-CM | POA: Diagnosis present

## 2022-10-11 DIAGNOSIS — I2722 Pulmonary hypertension due to left heart disease: Secondary | ICD-10-CM | POA: Diagnosis present

## 2022-10-11 DIAGNOSIS — Z1152 Encounter for screening for COVID-19: Secondary | ICD-10-CM | POA: Diagnosis not present

## 2022-10-11 DIAGNOSIS — E871 Hypo-osmolality and hyponatremia: Secondary | ICD-10-CM | POA: Diagnosis not present

## 2022-10-11 DIAGNOSIS — I2781 Cor pulmonale (chronic): Secondary | ICD-10-CM | POA: Diagnosis not present

## 2022-10-11 DIAGNOSIS — I2723 Pulmonary hypertension due to lung diseases and hypoxia: Secondary | ICD-10-CM | POA: Diagnosis present

## 2022-10-11 DIAGNOSIS — R768 Other specified abnormal immunological findings in serum: Secondary | ICD-10-CM | POA: Diagnosis present

## 2022-10-11 DIAGNOSIS — E876 Hypokalemia: Secondary | ICD-10-CM | POA: Diagnosis present

## 2022-10-11 DIAGNOSIS — E872 Acidosis, unspecified: Secondary | ICD-10-CM | POA: Diagnosis present

## 2022-10-11 DIAGNOSIS — I110 Hypertensive heart disease with heart failure: Secondary | ICD-10-CM | POA: Diagnosis present

## 2022-10-11 DIAGNOSIS — J9620 Acute and chronic respiratory failure, unspecified whether with hypoxia or hypercapnia: Secondary | ICD-10-CM | POA: Diagnosis present

## 2022-10-11 DIAGNOSIS — J9622 Acute and chronic respiratory failure with hypercapnia: Secondary | ICD-10-CM | POA: Diagnosis present

## 2022-10-11 DIAGNOSIS — R6510 Systemic inflammatory response syndrome (SIRS) of non-infectious origin without acute organ dysfunction: Secondary | ICD-10-CM | POA: Diagnosis present

## 2022-10-11 DIAGNOSIS — J449 Chronic obstructive pulmonary disease, unspecified: Secondary | ICD-10-CM | POA: Diagnosis present

## 2022-10-11 DIAGNOSIS — T502X5A Adverse effect of carbonic-anhydrase inhibitors, benzothiadiazides and other diuretics, initial encounter: Secondary | ICD-10-CM | POA: Diagnosis not present

## 2022-10-11 DIAGNOSIS — N179 Acute kidney failure, unspecified: Secondary | ICD-10-CM | POA: Diagnosis not present

## 2022-10-11 DIAGNOSIS — J849 Interstitial pulmonary disease, unspecified: Secondary | ICD-10-CM | POA: Diagnosis present

## 2022-10-11 DIAGNOSIS — Z8616 Personal history of COVID-19: Secondary | ICD-10-CM | POA: Diagnosis not present

## 2022-10-11 DIAGNOSIS — I2489 Other forms of acute ischemic heart disease: Secondary | ICD-10-CM | POA: Diagnosis present

## 2022-10-11 LAB — MAGNESIUM
Magnesium: 2.6 mg/dL — ABNORMAL HIGH (ref 1.7–2.4)
Magnesium: 2.2 mg/dL (ref 1.7–2.4)

## 2022-10-11 LAB — CBC
HCT: 46.1 % — ABNORMAL HIGH (ref 36.0–46.0)
Hemoglobin: 15.4 g/dL — ABNORMAL HIGH (ref 12.0–15.0)
MCH: 26.7 pg (ref 26.0–34.0)
MCHC: 33.4 g/dL (ref 30.0–36.0)
MCV: 79.9 fL — ABNORMAL LOW (ref 80.0–100.0)
Platelets: 290 10*3/uL (ref 150–400)
RBC: 5.77 MIL/uL — ABNORMAL HIGH (ref 3.87–5.11)
RDW: 20.7 % — ABNORMAL HIGH (ref 11.5–15.5)
WBC: 17.4 10*3/uL — ABNORMAL HIGH (ref 4.0–10.5)
nRBC: 0.4 % — ABNORMAL HIGH (ref 0.0–0.2)

## 2022-10-11 LAB — ECHOCARDIOGRAM COMPLETE
AR max vel: 2.46 cm2
AV Area VTI: 3.34 cm2
AV Area mean vel: 2.22 cm2
AV Mean grad: 1 mmHg
AV Peak grad: 2.2 mmHg
Ao pk vel: 0.75 m/s
Area-P 1/2: 10.25 cm2
Height: 61 in
MV VTI: 2.34 cm2
S' Lateral: 2.2 cm
Weight: 3739 oz

## 2022-10-11 LAB — PROCALCITONIN
Procalcitonin: <0.1 ng/mL
Procalcitonin: 0.13 ng/mL

## 2022-10-11 LAB — RESPIRATORY PANEL BY PCR

## 2022-10-11 LAB — HIV ANTIBODY (ROUTINE TESTING W REFLEX): HIV Screen 4th Generation wRfx: NONREACTIVE

## 2022-10-11 LAB — AEROBIC/ANAEROBIC CULTURE W GRAM STAIN (SURGICAL/DEEP WOUND)

## 2022-10-11 LAB — BASIC METABOLIC PANEL
Anion gap: 12 (ref 5–15)
Anion gap: 10 (ref 5–15)
BUN: 12 mg/dL (ref 6–20)
BUN: 13 mg/dL (ref 6–20)
CO2: 23 mmol/L (ref 22–32)
CO2: 24 mmol/L (ref 22–32)
Calcium: 9.2 mg/dL (ref 8.9–10.3)
Calcium: 9.5 mg/dL (ref 8.9–10.3)
Chloride: 101 mmol/L (ref 98–111)
Chloride: 101 mmol/L (ref 98–111)
Creatinine, Ser: 1.18 mg/dL — ABNORMAL HIGH (ref 0.44–1.00)
Creatinine, Ser: 1.22 mg/dL — ABNORMAL HIGH (ref 0.44–1.00)
GFR, Estimated: >60 mL/min (ref >60)
GFR, Estimated: >60 mL/min (ref >60)
Glucose, Bld: 91 mg/dL (ref 70–99)
Glucose, Bld: 153 mg/dL — ABNORMAL HIGH (ref 70–99)
Potassium: 3.6 mmol/L (ref 3.5–5.1)
Potassium: 3.7 mmol/L (ref 3.5–5.1)
Sodium: 136 mmol/L (ref 135–145)
Sodium: 135 mmol/L (ref 135–145)

## 2022-10-11 LAB — CHLAMYDIA/NGC RT PCR (ARMC ONLY)
Chlamydia Tr: NOT DETECTED
N gonorrhoeae: NOT DETECTED

## 2022-10-11 LAB — WET PREP, GENITAL
Clue Cells Wet Prep HPF POC: NONE SEEN
Sperm: NONE SEEN
Trich, Wet Prep: NONE SEEN
WBC, Wet Prep HPF POC: NONE SEEN — AB (ref <10)
Yeast Wet Prep HPF POC: NONE SEEN

## 2022-10-11 LAB — PROTIME-INR
INR: 2 — ABNORMAL HIGH (ref 0.8–1.2)
Prothrombin Time: 22.8 seconds — ABNORMAL HIGH (ref 11.4–15.2)

## 2022-10-11 LAB — LACTIC ACID, PLASMA
Lactic Acid, Venous: 2.8 mmol/L (ref 0.5–1.9)
Lactic Acid, Venous: 3.5 mmol/L (ref 0.5–1.9)
Lactic Acid, Venous: 2.2 mmol/L (ref 0.5–1.9)
Lactic Acid, Venous: 1.6 mmol/L (ref 0.5–1.9)
Lactic Acid, Venous: 2.3 mmol/L (ref 0.5–1.9)

## 2022-10-11 LAB — APTT
aPTT: 51 seconds — ABNORMAL HIGH (ref 24–36)
aPTT: 62 seconds — ABNORMAL HIGH (ref 24–36)

## 2022-10-11 LAB — PHOSPHORUS
Phosphorus: 5.8 mg/dL — ABNORMAL HIGH (ref 2.5–4.6)
Phosphorus: 4.4 mg/dL (ref 2.5–4.6)

## 2022-10-11 LAB — MRSA NEXT GEN BY PCR, NASAL: MRSA by PCR Next Gen: NOT DETECTED

## 2022-10-11 LAB — GLUCOSE, CAPILLARY: Glucose-Capillary: 122 mg/dL — ABNORMAL HIGH (ref 70–99)

## 2022-10-11 MED ORDER — CALCIUM CARBONATE ANTACID 500 MG PO CHEW: 1.0000 | CHEWABLE_TABLET | Freq: Three times a day (TID) | ORAL | Status: DC | PRN

## 2022-10-11 MED ORDER — APIXABAN 5 MG PO TABS: 5.0000 mg | ORAL_TABLET | Freq: Two times a day (BID) | ORAL | Status: DC

## 2022-10-11 MED ORDER — TRAMADOL HCL 50 MG PO TABS
25.0000 mg | ORAL_TABLET | Freq: Once | ORAL | Status: AC
  Administered 2022-10-11: 25 mg via ORAL
  Filled 2022-10-11: qty 1

## 2022-10-11 MED ORDER — SODIUM CHLORIDE 0.9% FLUSH
3.0000 mL | Freq: Two times a day (BID) | INTRAVENOUS | Status: DC
  Administered 2022-10-11 – 2022-10-16 (×10): 3 mL via INTRAVENOUS

## 2022-10-11 MED ORDER — FAMOTIDINE 20 MG PO TABS
20.0000 mg | ORAL_TABLET | Freq: Two times a day (BID) | ORAL | Status: DC
  Administered 2022-10-11 – 2022-10-17 (×13): 20 mg via ORAL
  Filled 2022-10-11 (×13): qty 1

## 2022-10-11 MED ORDER — VANCOMYCIN HCL 2000 MG/400ML IV SOLN
2000.0000 mg | Freq: Once | INTRAVENOUS | Status: AC
  Administered 2022-10-11: 2000 mg via INTRAVENOUS
  Filled 2022-10-11: qty 400

## 2022-10-11 MED ORDER — DOCUSATE SODIUM 100 MG PO CAPS: 100.0000 mg | ORAL_CAPSULE | Freq: Two times a day (BID) | ORAL | Status: DC | PRN

## 2022-10-11 MED ORDER — IPRATROPIUM-ALBUTEROL 0.5-2.5 (3) MG/3ML IN SOLN
3.0000 mL | Freq: Three times a day (TID) | RESPIRATORY_TRACT | Status: DC
  Administered 2022-10-11 – 2022-10-17 (×19): 3 mL via RESPIRATORY_TRACT
  Filled 2022-10-11 (×19): qty 3

## 2022-10-11 MED ORDER — BUDESONIDE 0.25 MG/2ML IN SUSP
0.2500 mg | Freq: Two times a day (BID) | RESPIRATORY_TRACT | Status: DC
  Administered 2022-10-11 – 2022-10-17 (×13): 0.25 mg via RESPIRATORY_TRACT
  Filled 2022-10-11 (×13): qty 2

## 2022-10-11 MED ORDER — POTASSIUM CHLORIDE CRYS ER 20 MEQ PO TBCR
40.0000 meq | EXTENDED_RELEASE_TABLET | Freq: Once | ORAL | Status: AC
  Administered 2022-10-11: 40 meq via ORAL
  Filled 2022-10-11: qty 2

## 2022-10-11 MED ORDER — APIXABAN 5 MG PO TABS
5.0000 mg | ORAL_TABLET | Freq: Two times a day (BID) | ORAL | Status: DC
  Administered 2022-10-11: 5 mg via ORAL
  Filled 2022-10-11: qty 1

## 2022-10-11 MED ORDER — HEPARIN BOLUS VIA INFUSION
1100.0000 [IU] | Freq: Once | INTRAVENOUS | Status: AC
  Administered 2022-10-11: 1100 [IU] via INTRAVENOUS
  Filled 2022-10-11: qty 1100

## 2022-10-11 MED ORDER — ORAL CARE MOUTH RINSE: 15.0000 mL | OROMUCOSAL | Status: DC | PRN

## 2022-10-11 MED ORDER — VALACYCLOVIR HCL 500 MG PO TABS
1000.0000 mg | ORAL_TABLET | Freq: Two times a day (BID) | ORAL | Status: AC
  Administered 2022-10-11 – 2022-10-15 (×10): 1000 mg via ORAL
  Filled 2022-10-11 (×10): qty 2

## 2022-10-11 MED ORDER — SPIRONOLACTONE 25 MG PO TABS
25.0000 mg | ORAL_TABLET | Freq: Every day | ORAL | Status: DC
  Administered 2022-10-11 – 2022-10-17 (×7): 25 mg via ORAL
  Filled 2022-10-11 (×7): qty 1

## 2022-10-11 MED ORDER — LIDOCAINE HCL URETHRAL/MUCOSAL 2 % EX GEL
1.0000 | Freq: Every day | CUTANEOUS | Status: DC | PRN
  Administered 2022-10-11 – 2022-10-12 (×4): 1 via TOPICAL
  Filled 2022-10-11 (×6): qty 5

## 2022-10-11 MED ORDER — CHLORHEXIDINE GLUCONATE CLOTH 2 % EX PADS
6.0000 | MEDICATED_PAD | Freq: Every day | CUTANEOUS | Status: DC
  Administered 2022-10-11 – 2022-10-17 (×6): 6 via TOPICAL

## 2022-10-11 MED ORDER — FAMOTIDINE 20 MG PO TABS: 20.0000 mg | ORAL_TABLET | Freq: Two times a day (BID) | ORAL | Status: DC

## 2022-10-11 MED ORDER — VANCOMYCIN HCL 500 MG/100ML IV SOLN
500.0000 mg | Freq: Once | INTRAVENOUS | Status: AC
  Administered 2022-10-11: 500 mg via INTRAVENOUS
  Filled 2022-10-11 (×2): qty 100

## 2022-10-11 MED ORDER — DIPHENHYDRAMINE HCL 50 MG/ML IJ SOLN
25.0000 mg | Freq: Once | INTRAMUSCULAR | Status: AC
  Administered 2022-10-11: 25 mg via INTRAVENOUS
  Filled 2022-10-11: qty 1

## 2022-10-11 MED ORDER — FUROSEMIDE 10 MG/ML IJ SOLN
80.0000 mg | Freq: Two times a day (BID) | INTRAMUSCULAR | Status: DC
  Administered 2022-10-11 – 2022-10-12 (×3): 80 mg via INTRAVENOUS
  Filled 2022-10-11 (×3): qty 8

## 2022-10-11 MED ORDER — POLYETHYLENE GLYCOL 3350 17 G PO PACK: 17.0000 g | PACK | Freq: Every day | ORAL | Status: DC | PRN

## 2022-10-11 MED ORDER — HEPARIN (PORCINE) 25000 UT/250ML-% IV SOLN
1050.0000 [IU]/h | INTRAVENOUS | Status: DC
  Administered 2022-10-11: 900 [IU]/h via INTRAVENOUS
  Filled 2022-10-11: qty 250

## 2022-10-11 MED ORDER — ACETAMINOPHEN 500 MG PO TABS
1000.0000 mg | ORAL_TABLET | Freq: Four times a day (QID) | ORAL | Status: DC
  Administered 2022-10-11 – 2022-10-17 (×20): 1000 mg via ORAL
  Filled 2022-10-11 (×21): qty 2

## 2022-10-11 MED ORDER — HYDROMORPHONE HCL 1 MG/ML IJ SOLN
1.0000 mg | Freq: Once | INTRAMUSCULAR | Status: AC
  Administered 2022-10-11: 1 mg via INTRAVENOUS
  Filled 2022-10-11: qty 1

## 2022-10-11 MED ORDER — SILDENAFIL CITRATE 20 MG PO TABS
20.0000 mg | ORAL_TABLET | Freq: Three times a day (TID) | ORAL | Status: DC
  Administered 2022-10-11 (×3): 20 mg via ORAL
  Filled 2022-10-11 (×4): qty 1

## 2022-10-11 MED ORDER — METHYLPREDNISOLONE SODIUM SUCC 40 MG IJ SOLR
40.0000 mg | Freq: Two times a day (BID) | INTRAMUSCULAR | Status: DC
  Administered 2022-10-11: 40 mg via INTRAVENOUS
  Filled 2022-10-11: qty 1

## 2022-10-11 MED ORDER — TREPROSTINIL 0.6 MG/ML IN SOLN
18.0000 ug | RESPIRATORY_TRACT | Status: DC
  Administered 2022-10-12 – 2022-10-17 (×22): 18 ug via RESPIRATORY_TRACT

## 2022-10-11 NOTE — ED Provider Notes (Signed)
Patient received in signout from Dr. Sidney Ace pending cross-sectional imaging in the setting of hypoxic respiratory failure requiring high flow 40 L/min at 70% to maintain sats.  CTA chest without PE, questions pneumonia.  CT abdomen/pelvis with some stigmata of cholecystitis and so a follow-up RUQ ultrasound is obtained that is essentially normal.  She is covered with broad-spectrum antibiotics and cultures are drawn.  I consult with ICU considering the degree of hypoxic failure and they agree to admit.  .Critical Care  Performed by: Delton Prairie, MD Authorized by: Delton Prairie, MD   Critical care provider statement:    Critical care time (minutes):  30   Critical care time was exclusive of:  Separately billable procedures and treating other patients   Critical care was necessary to treat or prevent imminent or life-threatening deterioration of the following conditions:  Respiratory failure   Critical care was time spent personally by me on the following activities:  Development of treatment plan with patient or surrogate, discussions with consultants, evaluation of patient's response to treatment, examination of patient, ordering and review of laboratory studies, ordering and review of radiographic studies, ordering and performing treatments and interventions, pulse oximetry, re-evaluation of patient's condition and review of old charts     Delton Prairie, MD 10/11/22 [phone]

## 2022-10-11 NOTE — ED Notes (Signed)
Report given to [facility], RN in ICU.

## 2022-10-11 NOTE — Consult Note (Signed)
PHARMACY CONSULT NOTE - FOLLOW UP  Pharmacy Consult for Electrolyte Monitoring and Replacement   Recent Labs: Potassium (mmol/L)  Date Value  10/11/2022 3.6  06/06/2013 3.2 (L)   Magnesium (mg/dL)  Date Value  52/84/1324 2.6 (H)   Calcium (mg/dL)  Date Value  40/03/2724 9.2   Calcium, Total (mg/dL)  Date Value  36/64/4034 9.4   Albumin (g/dL)  Date Value  74/25/9563 3.6  06/06/2013 3.9   Phosphorus (mg/dL)  Date Value  87/56/4332 5.8 (H)   Sodium (mmol/L)  Date Value  10/11/2022 136  06/06/2013 139     Assessment: 27 yo F with significant PMHx of PHTN, HFpEF, ILD, COPD & PE on Eliquis presenting to [facility] ED on 10/10/22 after being sent over from urgent care due to hypoxia. Admit to SDU due to acute on chronic hypoxic respiratory failure multifocal in the setting of mild HFpEF exacerbation, ILD, PHTN & COPD as well as HSV outbreak. Pharmacy has been consulted to monitor and replace electrolytes while under PCCM care.    Goal of Therapy:  Electrolytes WNL  Plan:  No replacement currently indicated Will recheck electrolytes tomorrow with AM labs  Bettey Costa ,PharmD Clinical Pharmacist 10/11/2022 3:49 PM

## 2022-10-11 NOTE — Consult Note (Addendum)
ANTICOAGULATION CONSULT NOTE  Pharmacy Consult for Heparin Indication:  History of DVT(plan for right heart catheter)  Allergies  Allergen Reactions   Ampicillin Rash and Other (See Comments)    Patient and family cannot remember the specifics of the reaction.   Penicillins Hives    Patient Measurements: Height: 5\' 1"  (154.9 cm) Weight: 106 kg (233 lb 11 oz) IBW/kg (Calculated) : 47.8 Heparin Dosing Weight: 73.8 kg  Vital Signs: Temp: 97.9 F (36.6 C) (05/08 2107) Temp Source: Oral (05/08 2107) BP: 96/77 (05/08 2107) Pulse Rate: 111 (05/08 2107)  Labs: Recent Labs    10/10/22 1947 10/10/22 2047 10/11/22 0517 10/11/22 1726 10/11/22 1732 10/11/22 2241  HGB 15.7*  --  15.4*  --   --   --   HCT 45.3  --  46.1*  --   --   --   PLT 334  --  290  --   --   --   APTT  --   --   --  51*  --  62*  LABPROT  --   --   --  22.8*  --   --   INR  --   --   --  2.0*  --   --   CREATININE  --  0.92 1.18*  --  1.22*  --   TROPONINIHS 40* 37*  --   --   --   --      Estimated Creatinine Clearance: 79.1 mL/min (A) (by C-G formula based on SCr of 1.22 mg/dL (H)).   Medical History: Past Medical History:  Diagnosis Date   CHF (congestive heart failure) (HCC)    COVID-19 04/2019   patient reports diagnosis in Nov 2020   Hypertension    Interstitial lung disease (HCC)    Morbid obesity with BMI of 40.0-44.9, adult (HCC)    Pre-eclampsia     Pertinent Medications:  Apixaban 5mg  BID, last dose: 5/8@0828   Assessment: 27 y.o. with history of chronic interstitial lung disease/inflammatory pneumonitis, pulmonary hypertension, HFpEF, PE, splenic vein thrombosis, OSA, sickle cell trait, chronic thrombocytosis/leukocytosis was admitted with acute on chronic hypoxemic respiratory failure.  Planning to have RHC done by cardiology on 5/9. In order to prepare for this, apixaban will be stopped and pharmacy has been consulted to initiate and titrate heparin infusion.  Baseline labs: Hgb  15.4, Plts 290, aPTT and INR pending  Goal of Therapy:  Heparin level 0.3-0.7 units/ml aPTT 66-102 seconds Monitor platelets by anticoagulation protocol: Yes   Plan:  5/9 @ 0603: aPTT = 75,  HL = > 1.10 - aPTT therapeutic X 1, HL elevated from Eliquis PTA - will continue pt on current rate and recheck aPTT in 6 hrs on 5/9 @ 1200. - Will recheck HL on 5/10 with AM Labs.  - continue to use aPTT to guide dosing until correlating with HL Continue to monitor H&H and platelets  Graycee Greeson D 10/11/2022,11:29 PM

## 2022-10-11 NOTE — Progress Notes (Signed)
eLink Physician-Brief Progress Note Patient Name: Yesenia Carrillo DOB: 05/11/1996 MRN: [id number]   Date of Service  10/11/2022  HPI/Events of Note  27 year old female with a history of heart failure, essential hypertension, ILD on 8 L baseline and previous PE on Eliquis who presented to the emergency department with generalized fatigue after she visited an urgent care for genital herpes outbreak. She was found to be hypoxic and transition from 8 L to heated high flow and referred to critical care for further management.  Patient has no clear associated symptoms.  On presentation, she is tachycardic, tachypneic, and mildly.  Started on vasopressors.  Laboratory studies show hypokalemia and mild lactic acidosis.  Urinalysis consistent with glucosuria.  eICU Interventions  Agree with ongoing acute high flow, maintain saturations greater than 88%.  Continue broad-spectrum antibiotics until organism can be identified.  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 Favor volume restricted approach.     Intervention Category Evaluation Type: New Patient Evaluation  Yarlin Breisch 10/11/2022, 3:04 AM

## 2022-10-11 NOTE — Consult Note (Signed)
Advanced Heart Failure Team Consult Note   Primary Physician: [facility] PCP-Cardiologist:  None  Reason for Consultation: RV failure/pulmonary hypertension  HPI:    Yesenia Carrillo is seen today for evaluation of RV failure/pulmonary hypertension at the request of Dr. Aundria Rud.   27 y.o. with history of chronic interstitial lung disease/inflammatory pneumonitis, pulmonary hypertension, HFpEF, PE, splenic vein thrombosis, OSA, sickle cell trait, chronic thrombocytosis/leukocytosis was admitted with acute on chronic hypoxemic respiratory failure.    Patient has a very long pulmonary history.  She has had multiple admissions for respiratory failure with intubation and ECMO.  In 2019, she had PNA complicated by ARDS resulting in ECMO, this was complicated by retroperitoneal bleed and a PE thought to have been provoked in setting of ECMO.  She again had PNA complicated by ARDS with intubation in 6/20.  She had COVID PNA in 11/20.  During 5/21 admission with acute on chronic hypoxemic respiratory failure, she was found to have splenic vein thrombosis. In 2/22, she was admitted for preeclampsia and CHF while pregnant, had wedge=shaped defects on V/Q scan initially concerning for PE but seen by pulmonary and thought to represent her underlying lung disease. She had a right heart cath during this time with pulmonary hypertension thought to be primarily due to HFpEF, OSA, and pulmonary disease (groups 2 and 3). Most recent RHC in 6/23 showed mean RA 10, PA 90/40 mean 58, mean PCWP 10, CI 1.5.  Since that time, she has been on Tyvaso inhaled and sildenafil 20 mg tid. She has been followed by [facility] pulmonology, thought to have interstitial lung process of unclear etiology. PFTs have shown restriction with severely decreased DLCO and serial CTs have shown diffuse GGOs and bibasilar scattered consolidation.  Suspect some form of inflammatory pneumonitis, probably not hypersensitivity  pneumonitis as there has not been much response to steroids. She was thought to be too high risk for lung biopsy.   Last echo in 12/23 showed EF 55-60%, moderately dilated RV with moderate systolic dysfunction, PASP 67 mmHg.   She as admitted in 4/24 to [facility] with ILD flare thought to be related to rhinovirus infection.   At baseline, she wears 8L home oxygen. She reports fatigue for about 1 week prior to admission.  She was seen in urgent care yesterday for genital herpes infection, but was noted to be hypoxemic so was sent to the ER.  She was admitted with acute on chronic hypoxemic respiratory failure and placed on HFNC, currently 40%.  Lactate 3.5 => 2.2.  Respiratory virus panel negative. Procalcitonin not significantly elevated. BNP 400.  She has been started on IV Solumedrol. She had some upper abdominal pain, RUQ [place] negative for cholecystitis.  CTA chest with no PE, mild-moderate pericardial effusion, GGO throughout lungs.   Review of Systems: All systems reviewed and negative except as per HPI.   Home Medications Prior to Admission medications   Medication Sig Start Date End Date Taking? Authorizing Provider  albuterol (VENTOLIN HFA) 108 (90 Base) MCG/ACT inhaler Inhale 1-2 puffs into the lungs every 6 (six) hours as needed for wheezing or shortness of breath. 07/25/21  Yes [provider]  ELIQUIS 5 MG TABS tablet Take 5 mg by mouth 2 (two) times daily.   Yes [provider]  empagliflozin (JARDIANCE) 10 MG TABS tablet Take 10 mg by mouth daily. 10/19/20  Yes [provider]  famotidine (PEPCID) 20 MG tablet Take 20 mg by mouth 2 (two) times  daily. 08/30/22 10/29/22 Yes [provider]  Fluticasone-Umeclidin-Vilant (TRELEGY ELLIPTA) 200-62.5-25 MCG/ACT AEPB Inhale 1 puff into the lungs daily.   Yes [provider]  ipratropium-albuterol (DUONEB) 0.5-2.5 (3) MG/3ML SOLN Take 3 mLs by nebulization every 6 (six) hours as needed. 09/29/22  Yes Darlin Priestly,  MD  sildenafil (REVATIO) 20 MG tablet Take 20 mg by mouth 3 (three) times daily. 04/19/22  Yes [provider]  spironolactone (ALDACTONE) 25 MG tablet Take 25 mg by mouth daily.   Yes [provider]  torsemide (DEMADEX) 20 MG tablet Take 40 mg by mouth 2 (two) times daily. 05/10/22 05/10/23 Yes [provider]  TYVASO REFILL 0.6 MG/ML SOLN Inhale 18 mcg into the lungs every 4 (four) hours while awake. 11/30/21  Yes [provider]    Past Medical History: Past Medical History:  Diagnosis Date   CHF (congestive heart failure) (HCC)    COVID-19 04/2019   patient reports diagnosis in Nov 2020   Hypertension    Interstitial lung disease (HCC)    Morbid obesity with BMI of 40.0-44.9, adult (HCC)    Pre-eclampsia     Past Surgical History: Past Surgical History:  Procedure Laterality Date   ECMO CANNULATION     TONSILLECTOMY     WISDOM TOOTH EXTRACTION      Family History: Family History  Problem Relation Age of Onset   Healthy Mother    Healthy Father     Social History: Social History   Socioeconomic History   Marital status: Single    Spouse name: Not on file   Number of children: Not on file   Years of education: Not on file   Highest education level: Not on file  Occupational History   Not on file  Tobacco Use   Smoking status: Former    Years: 2    Types: Cigarettes    Quit date: 12/05/2015    Years since quitting: 6.8   Smokeless tobacco: Never   Tobacco comments:    2 cig/day  Vaping Use   Vaping Use: Never used  Substance and Sexual Activity   Alcohol use: Not Currently   Drug use: No   Sexual activity: Not on file  Other Topics Concern   Not on file  Social History Narrative   Not on file   Social Determinants of Health   Financial Resource Strain: Not on file  Food Insecurity: No Food Insecurity (09/27/2022)   Hunger Vital Sign    Worried About Running Out of Food in the Last Year: Never true    Ran Out of  Food in the Last Year: Never true  Transportation Needs: No Transportation Needs (09/27/2022)   PRAPARE - Administrator, Civil Service (Medical): No    Lack of Transportation (Non-Medical): No  Physical Activity: Not on file  Stress: Not on file  Social Connections: Not on file    Allergies:  Allergies  Allergen Reactions   Ampicillin Rash and Other (See Comments)    Patient and family cannot remember the specifics of the reaction.   Penicillins Hives    Objective:    Vital Signs:   Temp:  [97.6 F (36.4 C)-99.9 F (37.7 C)] 97.9 F (36.6 C) (05/08 1210) Pulse Rate:  [32-130] 105 (05/08 1210) Resp:  [19-47] 26 (05/08 1210) BP: (88-126)/(58-103) 103/61 (05/08 1210) SpO2:  [68 %-100 %] 99 % (05/08 1210) FiO2 (%):  [60 %-80 %] 67 % (05/08 1210) Weight:  [106 kg-106.6  kg] 106 kg (05/08 0404)    Weight change: Filed Weights   10/10/22 1919 10/11/22 0404  Weight: 106.6 kg 106 kg    Intake/Output:   Intake/Output Summary (Last 24 hours) at 10/11/2022 1256 Last data filed at 10/11/2022 0411 Gross per 24 hour  Intake 100.4 ml  Output --  Net 100.4 ml      Physical Exam    General:  Well appearing. No resp difficulty HEENT: normal Neck: Thick. JVP 14-16 cm. Carotids 2+ bilat; no bruits. No lymphadenopathy or thyromegaly appreciated. Cor: PMI nondisplaced. Mildly tachy, regular rate & rhythm. No rubs, gallops or murmurs. Lungs: Mild crackles at bases.  Abdomen: soft, nontender, nondistended. No hepatosplenomegaly. No bruits or masses. Good bowel sounds. Extremities: no cyanosis, clubbing, rash, edema Neuro: alert & orientedx3, cranial nerves grossly intact. moves all 4 extremities w/o difficulty. Affect pleasant   Telemetry   NSR 110s (personally reviewed)  EKG    NSR, RVH (personally reviewed)  Labs   Basic Metabolic Panel: Recent Labs  Lab 10/10/22 2047 10/11/22 0517  NA 137 136  K 3.2* 3.6  CL 102 101  CO2 24 23  GLUCOSE 80 91  BUN 9 12   CREATININE 0.92 1.18*  CALCIUM 8.7* 9.2  MG  --  2.6*  PHOS  --  5.8*    Liver Function Tests: Recent Labs  Lab 10/10/22 2047  AST 29  ALT 18  ALKPHOS 109  BILITOT 5.8*  PROT 7.3  ALBUMIN 3.6   Recent Labs  Lab 10/10/22 2047  LIPASE 25   No results for input(s): "AMMONIA" in the last 168 hours.  CBC: Recent Labs  Lab 10/10/22 1947 10/11/22 0517  WBC 15.6* 17.4*  NEUTROABS 9.8*  --   HGB 15.7* 15.4*  HCT 45.3 46.1*  MCV 78.4* 79.9*  PLT 334 290    Cardiac Enzymes: No results for input(s): "CKTOTAL", "CKMB", "CKMBINDEX", "TROPONINI" in the last 168 hours.  BNP: BNP (last 3 results) Recent Labs    08/09/22 0501 09/26/22 2131 10/10/22 1947  BNP 251.2* 462.9* 399.9*    ProBNP (last 3 results) No results for input(s): "PROBNP" in the last 8760 hours.   CBG: Recent Labs  Lab 10/11/22 0236  GLUCAP 122*    Coagulation Studies: No results for input(s): "LABPROT", "INR" in the last 72 hours.   Imaging   US ABDOMEN LIMITED RUQ (LIVER/GB)  Result Date: 10/11/2022 CLINICAL DATA:  Right upper quadrant abdominal pain EXAM: ULTRASOUND ABDOMEN LIMITED RIGHT UPPER QUADRANT COMPARISON:  CT 10/10/2022 FINDINGS: Gallbladder: No gallstones or wall thickening visualized. No sonographic Murphy sign noted by sonographer. Common bile duct: Diameter: 3 mm in proximal diameter Liver: No focal lesion identified. Within normal limits in parenchymal echogenicity. Portal vein is patent on color Doppler imaging with normal direction of blood flow towards the liver. Other: None. IMPRESSION: 1. Normal right upper quadrant sonogram. No evidence of acute cholecystitis. Electronically Signed   By: Helyn Numbers M.D.   On: 10/11/2022 01:27   CT ABDOMEN PELVIS W CONTRAST  Result Date: 10/10/2022 CLINICAL DATA:  Acute abdominal pain. EXAM: CT ABDOMEN AND PELVIS WITH CONTRAST TECHNIQUE: Multidetector CT imaging of the abdomen and pelvis was performed using the standard protocol  following bolus administration of intravenous contrast. RADIATION DOSE REDUCTION: This exam was performed according to the departmental dose-optimization program which includes automated exposure control, adjustment of the mA and/or kV according to patient size and/or use of iterative reconstruction technique. CONTRAST:  OMNIPAQUE IOHEXOL 350 MG/ML  SOLN COMPARISON:  CT abdomen and pelvis 09/27/2022 FINDINGS: Lower chest: There are ground-glass opacities in the lung bases. Hepatobiliary: There is mild inflammation surrounding the gallbladder best seen on coronal imaging. There is no biliary ductal dilatation. There is fatty infiltration of the liver. Pancreas: Unremarkable. No pancreatic ductal dilatation or surrounding inflammatory changes. Spleen: Small in size, unchanged. Adrenals/Urinary Tract: Adrenal glands are unremarkable. Kidneys are normal, without renal calculi, focal lesion, or hydronephrosis. Bladder is unremarkable. Stomach/Bowel: There is a small rounded hyperdense area in the distal esophagus measuring 8 mm which may represent pill fragment. Stomach is within normal limits. Appendix appears normal. No evidence of bowel wall thickening, distention, or inflammatory changes. Vascular/Lymphatic: Nonenlarged gastrohepatic and portacaval lymph nodes are again seen. Aorta and IVC are normal in size. Reproductive: Uterus and bilateral adnexa are unremarkable. Other: Central mesenteric haziness is present, but has decreased. No ascites or focal abdominal wall hernia. There is scarring in the left inguinal region, unchanged. There is some skin thickening of the lower anterior abdominal wall. This is new from prior. Musculoskeletal: No acute or significant osseous findings. IMPRESSION: 1. Mild inflammation surrounding the gallbladder worrisome for acute cholecystitis. Recommend further evaluation with ultrasound. 2. Fatty infiltration of the liver. 3. Central mesenteric haziness has decreased. 4. New skin  thickening of the lower anterior abdominal wall. Correlate clinically for cellulitis. 5. Ground-glass opacities in the lung bases, likely infectious/inflammatory. Electronically Signed   By: Darliss Cheney M.D.   On: 10/10/2022 23:37   CT Angio Chest PE W and/or Wo Contrast  Result Date: 10/10/2022 CLINICAL DATA:  High probability for PE EXAM: CT ANGIOGRAPHY CHEST WITH CONTRAST TECHNIQUE: Multidetector CT imaging of the chest was performed using the standard protocol during bolus administration of intravenous contrast. Multiplanar CT image reconstructions and MIPs were obtained to evaluate the vascular anatomy. RADIATION DOSE REDUCTION: This exam was performed according to the departmental dose-optimization program which includes automated exposure control, adjustment of the mA and/or kV according to patient size and/or use of iterative reconstruction technique. CONTRAST:  OMNIPAQUE IOHEXOL 350 MG/ML SOLN COMPARISON:  CT angiogram chest 09/27/2022 FINDINGS: Cardiovascular: Aorta is normal in size. Heart is enlarged, unchanged. There is a small to moderate-sized pericardial effusion similar to prior study. There is adequate opacification of the pulmonary arteries to the segmental level. No pulmonary embolism identified. Main pulmonary artery is enlarged compatible with pulmonary artery hypertension similar to the prior study. Mediastinum/Nodes: There are enlarged and nonenlarged prevascular lymph nodes measuring up to 18 mm short axis similar to the prior study. Visualized esophagus and thyroid gland are within normal limits. Lungs/Pleura: Patchy ground-glass opacities are seen throughout both lungs, mildly increased from prior. There is no lung consolidation, pleural effusion or pneumothorax. Upper Abdomen: No acute abnormality. Musculoskeletal: No chest wall abnormality. No acute or significant osseous findings. Review of the MIP images confirms the above findings. IMPRESSION: 1. No evidence for pulmonary  embolism. 2. Stable cardiomegaly with small to moderate-sized pericardial effusion. 3. Stable pulmonary artery hypertension. 4. Patchy ground-glass opacities throughout both lungs, mildly increased from prior. Findings are favored as infectious/inflammatory. 5. Stable mediastinal lymphadenopathy. Electronically Signed   By: Darliss Cheney M.D.   On: 10/10/2022 23:31   DG Chest Portable 1 View  Result Date: 10/10/2022 CLINICAL DATA:  Hypoxia EXAM: PORTABLE CHEST 1 VIEW COMPARISON:  Chest x-ray 09/26/2022 FINDINGS: Cardiac silhouette is enlarged, unchanged. The lungs are clear. There is no pleural effusion or pneumothorax. No fractures are seen. IMPRESSION: Cardiomegaly. No acute pulmonary  process. Electronically Signed   By: Darliss Cheney M.D.   On: 10/10/2022 20:11     Medications:     Current Medications:  acetaminophen  1,000 mg Oral Q6H   budesonide (PULMICORT) nebulizer solution  0.25 mg Nebulization BID   Chlorhexidine Gluconate Cloth  6 each Topical Daily   famotidine  20 mg Oral BID   furosemide  80 mg Intravenous BID   ipratropium-albuterol  3 mL Nebulization TID   sildenafil  20 mg Oral TID   sodium chloride flush  3 mL Intravenous Q12H   spironolactone  25 mg Oral Daily   Treprostinil  18 mcg Inhalation Q4H while awake   valACYclovir  1,000 mg Oral BID    Infusions:     Assessment/Plan   1. Acute on chronic hypoxemic respiratory failure: Patient is on home oxygen 8L [place] at baseline, now requiring HFNC.  She has been followed by [facility] pulmonology, thought to have interstitial lung process of unclear etiology. PFTs have shown restriction with severely decreased DLCO and serial CTs have shown diffuse GGOs and bibasilar scattered consolidation.  Suspect some form of inflammatory pneumonitis, probably not hypersensitivity pneumonitis as there has not been much response to steroids. She was thought to be too high risk for lung biopsy. CTA chest this admission with no PE, diffuse GGO.   Suspect that there is a component of CHF/volume overload/HFpEF.  Procalcitonin not significantly elevated and respiratory viral panel negative.  - Steroids per CCM.  - No antibiotics at this time.  - Would diurese, start Lasix 80 mg IV bid and spironolactone 25 mg daily.   2. HFpEF with prominent RV failure and pulmonary hypertension (mixed pulmonary arterial/pulmonary venous hypertension in past): Last echo in 12/23 showed EF 55-60%, moderately dilated RV with moderate systolic dysfunction, PASP 67 mmHg.  There is evidence for RV failure on exam. Suspect some of GGO on CTA chest is related to pulmonary edema.  Lactate still mildly elevated at 2.2, worrisome for poor cardiac output in setting of RV failure.  - Lasix 80 mg IV bid + spironolactone as above. Replace K.  - Repeat lactate.  - With GU infection (herpes flare), would hold off on SGLT2 inhibitor.  - Will arrange for RHC tomorrow to assess filling pressures, cardiac output, and PA pressure.  She may benefit from milrinone for RV support while we diurese.  Discussed risks/benefits with patient and she agrees to the procedure.  3. 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  I think this is predominantly groups 2 and 3 PH from HFpEF, parenchymal lung disease (ILD process) and OSA. She is already on 8L home oxygen and I worry that she is heading towards end stage RV failure. RHC in 2022 per report had elevated PCWP.  Most recent RHC in 6/23 showed mean RA 10, PA 90/40 mean 58, mean PCWP 10, CI 1.5. Low cardiac output is concerning. As above, she has signs of RV failure on exam and elevated lactate.  - RHC tomorrow to assess filling pressures, cardiac output, and PA pressure.  - She will continue her home Tyvaso which is likely a good choice with PH in setting of ILD.  Will also continue sildenafil for now.  4. Abdominal discomfort: AST/ALT normal, RUQ [place] with no evidence for cholecystectomy.  Elevated  bilirubin may be suggestive of hepatic congestion from RV failure.  5. 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  She  has been anticoagulated.  - Would hold apixaban for cath, cover with heparin gtt.   Length of Stay: 0  Marca Ancona, MD  10/11/2022, 12:56 PM  Advanced Heart Failure Team Pager [phone] (M-F; 7a - 5p)  Please contact [facility] for night-coverage after hours (4p -7a ) and weekends on [email or web address]

## 2022-10-11 NOTE — Consult Note (Signed)
ANTICOAGULATION CONSULT NOTE  Pharmacy Consult for Heparin Indication:  History of DVT(plan for right heart catheter)  Allergies  Allergen Reactions   Ampicillin Rash and Other (See Comments)    Patient and family cannot remember the specifics of the reaction.   Penicillins Hives    Patient Measurements: Height: 5\' 1"  (154.9 cm) Weight: 106 kg (233 lb 11 oz) IBW/kg (Calculated) : 47.8 Heparin Dosing Weight: 73.8 kg  Vital Signs: Temp: 97.9 F (36.6 C) (05/08 1210) Temp Source: Oral (05/08 1210) BP: 103/61 (05/08 1210) Pulse Rate: 105 (05/08 1353)  Labs: Recent Labs    10/10/22 1947 10/10/22 2047 10/11/22 0517  HGB 15.7*  --  15.4*  HCT 45.3  --  46.1*  PLT 334  --  290  CREATININE  --  0.92 1.18*  TROPONINIHS 40* 37*  --     Estimated Creatinine Clearance: 81.8 mL/min (A) (by C-G formula based on SCr of 1.18 mg/dL (H)).   Medical History: Past Medical History:  Diagnosis Date   CHF (congestive heart failure) (HCC)    COVID-19 04/2019   patient reports diagnosis in Nov 2020   Hypertension    Interstitial lung disease (HCC)    Morbid obesity with BMI of 40.0-44.9, adult (HCC)    Pre-eclampsia     Pertinent Medications:  Apixaban 5mg  BID, last dose: 5/8@0828   Assessment: 27 y.o. with history of chronic interstitial lung disease/inflammatory pneumonitis, pulmonary hypertension, HFpEF, PE, splenic vein thrombosis, OSA, sickle cell trait, chronic thrombocytosis/leukocytosis was admitted with acute on chronic hypoxemic respiratory failure.  Planning to have RHC done by cardiology on 5/9. In order to prepare for this, apixaban will be stopped and pharmacy has been consulted to initiate and titrate heparin infusion.  Baseline labs: Hgb 15.4, Plts 290, aPTT and INR pending  Goal of Therapy:  Heparin level 0.3-0.7 units/ml aPTT 66-102 seconds Monitor platelets by anticoagulation protocol: Yes   Plan:  Start heparin infusion at 900 units/hr without bolus(can use  nomogram thereafter) Check anti-Xa level in 6 hours and daily while on heparin Continue to monitor H&H and platelets  Aiva Miskell A Shristi Scheib 10/11/2022,2:28 PM

## 2022-10-11 NOTE — Progress Notes (Signed)
*  PRELIMINARY RESULTS* Echocardiogram 2D Echocardiogram has been performed.  Yesenia Carrillo 10/11/2022, 1:13 PM

## 2022-10-11 NOTE — H&P (Addendum)
NAME:  Yesenia Carrillo, MRN:  [id number], DOB:  1996-05-24, LOS: 0 ADMISSION DATE:  10/10/2022, CONSULTATION DATE:  10/11/22 REFERRING MD:  Dr. Adaline Sill, CHIEF COMPLAINT: Shortness of Breath    History of Present Illness:  27 yo F with significant PMHx of PHTN, HFpEF, ILD, COPD & PE on Eliquis presenting to [facility] ED on 10/10/22 after being sent over from urgent care due to hypoxia.   History obtained per chart review and patient bedside report. Patient extremely drowsy after dilaudid  & benadryl IVP in ED. Poor historian at this time. Patient reports feeling overall unwell with generalized fatigue over the past 1 week. She went to urgent care today for evaluation of a genital herpes outbreak and was found to be hypoxic in the 80's on baseline 8 L [place]. Patient endorsed dyspnea and productive cough in the ED, but denied dyspnea during my interview.  She does report diffuse/constant abdominal pain over the last 3 days. She told ED provider her feet appear more swollen, but has not been checking her weight. She endorsed pain with urination due to her genital herpes outbreak, as well as some discharge. She denies nausea/vomiting, but reported some diarrhea to ED provider. Denied chest pain and states she has been compliant with all medication including Eliquis. She stated that at home she has not been monitoring her SpO2 but titrated her oxygen up to 8 L [place] due to dyspnea, she is unsure how long it has been at 8 L. Her most recent pulmonology appt showed her at 6 L [place] outpatient. She states it has been "a while" since she has seen her cardiologist. She did have a sleep study done, and now has a BIPAP she wears nightly then 8 L [place] during the day. Unable to discuss ETOH/tobacco/recreational drug use history at this time.   ED course: Upon arrival patient significantly hypoxic on home 8L [place] at 68%. She was placed on a NRB and ultimately HHFNC at 75% 40 L. Patient appearing chronically ill without significant work of  breathing. She was given Dilaudid for abdominal pain.  Labs significant for hypokalemia, elevated but flat troponin suggestive of demand ischemia, elevated BNP, lactic acidosis & leukocytosis but negative PCT. Imaging ruled out PE, demonstrated continued GGO & pericardial effusion, possible cholecystitis that was ruled out on [place] and possible cellulitis that can't be clinically correlated with exam. Herpes outbreak present.  Medications given: benadryl, lasix, dilaudid, potassium 40 meq, zosyn & vancomycin, IV contrast Initial Vitals: 99.9, 130, 30, 126/85 & 68% on 8 L  Significant labs: (Labs/ Imaging personally reviewed) I, Cheryll Cockayne Rust-Chester, AGACNP-BC, personally viewed and interpreted this ECG. EKG Interpretation: Date: 10/10/22, EKG Time: 19:26, Rate: 112, Rhythm: NSR, QRS Axis: RAD Intervals: borderline prolonged Qtc & bi-atrial enlargement, ST/T Wave abnormalities: none, Narrative Interpretation: NSR Chemistry: Na+: 137, K+: 3.2, BUN/Cr.: 9/0.92, Serum CO2/ AG: 24/11 Hematology: WBC: 15.6, Hgb: 15.7,  Troponin: 40> 37, BNP: 399.9, Lactic/ PCT: 2.8/ < 0.10, COVID-19: negative  ABG: 7.34/ 54/ 41/ 29.1 CXR 10/10/22: cardiomegaly no acute pulmonary process CT angio chest PE 10/10/22: negative for PE. Stable cardiodmegaly with small to moderate-sized pericardial effusion. Stable pulmonary artery hypertension. Patchy ground-glass opacities throughout both lungs, mildly increased from prior. Findings are favored as infectious/inflammatory. Stable mediastinal lymphadenopathy. CT abdomen pelvis w contrast 10/10/22: Mild inflammation surrounding the gallbladder worrisome for acute cholecystitis. Recommend further evaluation with ultrasound. Fatty infiltration of the liver. 3. Central mesenteric haziness has decreased. New skin thickening of the lower  anterior abdominal wall. Correlate clinically for cellulitis. Ground-glass opacities in the lung bases, likely infectious/inflammatory. US abdominal  limited RUQ 10/11/22:  Normal right upper quadrant sonogram. No evidence of acute cholecystitis.  PCCM consulted for admission due to acute on chronic hypoxic respiratory failure multifocal in the setting of mild HFpEF exacerbation, ILD, PHTN & COPD.  Pertinent  Medical History  PH (group 2 & 3) HFpEF COPD  HTN Provoked PE on eliquis Splenic vein thrombosis Splenic infarcts  Recurrent episodes of respiratory failure (multiple intubations & ECMO 2019-2022) Chronic idiopathic leukocytosis/thrombocytosis Tobacco abuse Obesity Sickle cell trait COVID-19 ILD Significant Hospital Events: Including procedures, antibiotic start and stop dates in addition to other pertinent events   10/11/22: Admit to SDU due to acute on chronic hypoxic respiratory failure multifocal in the setting of mild HFpEF exacerbation, ILD, PHTN & COPD as well as HSV outbreak.  Interim History / Subjective:  Patient extremely drowsy after dilaudid & benadryl dosing in ED for abdominal pain and itching.  Objective   Blood pressure (!) 115/92, pulse (!) 118, temperature 98.2 F (36.8 C), temperature source Oral, resp. rate (!) 22, height 5\' 1"  (1.549 m), weight 106.6 kg, last menstrual period 06/11/2022, SpO2 96 %, unknown if currently breastfeeding.    FiO2 (%):  [60 %-75 %] 75 %  No intake or output data in the 24 hours ending 10/11/22 0203 Filed Weights   10/10/22 1919  Weight: 106.6 kg    Examination: General: Adult female, acutely ill, lying in bed, NAD HEENT: MM pink/moist, anicteric, atraumatic, neck supple Neuro: RASS: -2, Oriented x 2-3, able to follow persistent commands, PERRL +3, MAE CV: s1s2 RRR, NSR on monitor, no r/m/g Pulm: Regular, non labored on HHFNC 83% & 40 L, breath sounds clear-BUL & diminished-BLL GI: soft, rounded, non tender, bs x 4 Skin: genital herpes leisions Extremities: warm/dry, pulses + 2 R/P, trace edema noted bilateral feet   Resolved Hospital Problem list     Assessment &  Plan:  Acute on Chronic Hypoxic Hypercapnic Respiratory Failure suspect secondary to evolving ILD in the setting of mild HFpEF exacerbation, COPD & pulmonary hypertension PMHx: recurrent episodes of respiratory failure requiring intubation & ECMO (2019-2022) Patient on chronic 8 L [place] outpatient. CTa is showing mildly increased GGO from CTa imaging in April. - Continue HHFNC overnight, wean FiO2 as tolerated - Supplemental O2 to maintain SpO2 > 88% - Intermittent chest x-ray & ABG PRN - Ensure adequate pulmonary hygiene  - F/u cultures, trend PCT - solu medrol 40 mg BID - budesonide nebs BID, Duo neb TID, bronchodilators PRN  Lactic Acidosis & Leukocytosis Patient met SIRS criteria on arrival without a clear source of bacterial infection. 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 Also has a history idiopathic leukocytosis.  Patient received Zosyn & vancomycin - f/u cultures, trend lactic/ PCT - Daily CBC, monitor WBC/ fever curve - will hold off on further antibiotics at this time, f/u PCT trend as guide  Abdominal Pain Unclear etiology and patient too drowsy to participate in a meaningful way in interview. CT abd discussed possible cellulitis, unable to correlate with bedside exam. - hold off on further pain medication due to drowsiness  Mild Acute on Chronic HFpEF exacerbation Small- moderate pericardial effusion Elevated Troponin secondary to demand ischemia PMHx: HTN - Echocardiogram ordered - Continuous cardiac monitoring  - Strict I/O's: alert provider if UOP < 0.5 mL/kg/hr - Daily BMP, replace electrolytes PRN - Daily weights to assess volume status - consider  cardiology consult PRN - BP marginal, will hold outpatient spironolactone & torsemide, consider restarting as patient stabilizes  Hypokalemia - daily BMP, replace electrolytes PRN - K+ supplemented  History of Provoked PE History of splenic vein thrombosis - continue outpatient Eliquis  Pulmonary  Hypertension (group 2 & 3) - continue outpatient regimen: Tyvaso inhaler & sildenafil  HSV infection Vaginal discharge - valacyclovir 1g BID x 5 days - f/u wet prep & chlamydia, anaerobic/aerobic culture  Best Practice (right click and "Reselect all SmartList Selections" daily)  Diet/type: NPO w/ oral meds > advance diet as tolerated DVT prophylaxis: DOAC GI prophylaxis: H2B Lines: N/A Foley:  N/A Code Status:  full code Last date of multidisciplinary goals of care discussion [10/11/22]  Labs   CBC: Recent Labs  Lab 10/10/22 1947  WBC 15.6*  NEUTROABS 9.8*  HGB 15.7*  HCT 45.3  MCV 78.4*  PLT 334    Basic Metabolic Panel: Recent Labs  Lab 10/10/22 2047  NA 137  K 3.2*  CL 102  CO2 24  GLUCOSE 80  BUN 9  CREATININE 0.92  CALCIUM 8.7*   GFR: Estimated Creatinine Clearance: 105.2 mL/min (by C-G formula based on SCr of 0.92 mg/dL). Recent Labs  Lab 10/10/22 1947 10/10/22 2047 10/11/22 0025  PROCALCITON  --  <0.10  --   WBC 15.6*  --   --   LATICACIDVEN  --   --  2.8*    Liver Function Tests: Recent Labs  Lab 10/10/22 2047  AST 29  ALT 18  ALKPHOS 109  BILITOT 5.8*  PROT 7.3  ALBUMIN 3.6   Recent Labs  Lab 10/10/22 2047  LIPASE 25   No results for input(s): "AMMONIA" in the last 168 hours.  ABG    Component Value Date/Time   PHART 7.44 10/29/2019 1224   PCO2ART 37 10/29/2019 1224   PO2ART 60 (L) 10/29/2019 1224   HCO3 29.1 (H) 10/10/2022 2226   ACIDBASEDEF 0.6 10/30/2019 0649   O2SAT 63.2 10/10/2022 2226     Coagulation Profile: No results for input(s): "INR", "PROTIME" in the last 168 hours.  Cardiac Enzymes: No results for input(s): "CKTOTAL", "CKMB", "CKMBINDEX", "TROPONINI" in the last 168 hours.  HbA1C: Hgb A1c MFr Bld  Date/Time Value Ref Range Status  08/09/2022 08:53 AM 6.1 (H) 4.8 - 5.6 % Final    Comment:    (NOTE)         Prediabetes: 5.7 - 6.4         Diabetes: >6.4         Glycemic control for adults with  diabetes: <7.0   05/24/2022 03:02 AM 6.2 (H) 4.8 - 5.6 % Final    Comment:    (NOTE)         Prediabetes: 5.7 - 6.4         Diabetes: >6.4         Glycemic control for adults with diabetes: <7.0     CBG: No results for input(s): "GLUCAP" in the last 168 hours.  Review of Systems: positives in BOLD  Gen: Denies fever, chills, weight change, fatigue, night sweats HEENT: Denies blurred vision, double vision, hearing loss, tinnitus, sinus congestion, rhinorrhea, sore throat, neck stiffness, dysphagia PULM: Denies shortness of breath, cough, sputum production, hemoptysis, wheezing CV: Denies chest pain, edema, orthopnea, paroxysmal nocturnal dyspnea, palpitations GI: Denies abdominal pain, nausea, vomiting, diarrhea, hematochezia, melena, constipation, change in bowel habits GU: Denies dysuria, hematuria, polyuria, oliguria, urethral discharge Endocrine: Denies hot or cold intolerance, polyuria,  polyphagia or appetite change Derm: Denies rash, dry skin, scaling or peeling skin change Heme: Denies easy bruising, bleeding, bleeding gums Neuro: Denies headache, numbness, weakness, slurred speech, loss of memory or consciousness  Past Medical History:  She,  has a past medical history of CHF (congestive heart failure) (HCC), COVID-19 (04/2019), Hypertension, Interstitial lung disease (HCC), Morbid obesity with BMI of 40.0-44.9, adult (HCC), and Pre-eclampsia.   Surgical History:   Past Surgical History:  Procedure Laterality Date   ECMO CANNULATION     TONSILLECTOMY     WISDOM TOOTH EXTRACTION       Social History:   reports that she quit smoking about 6 years ago. Her smoking use included cigarettes. She has never used smokeless tobacco. She reports that she does not currently use alcohol. She reports that she does not use drugs.   Family History:  Her family history includes Healthy in her father and mother.   Allergies Allergies  Allergen Reactions   Ampicillin Rash and Other  (See Comments)    Patient and family cannot remember the specifics of the reaction.   Penicillins Hives     Home Medications  Prior to Admission medications   Medication Sig Start Date End Date Taking? Authorizing Provider  albuterol (VENTOLIN HFA) 108 (90 Base) MCG/ACT inhaler Inhale 1-2 puffs into the lungs every 6 (six) hours as needed for wheezing or shortness of breath. 07/25/21  Yes [provider]  ELIQUIS 5 MG TABS tablet Take 5 mg by mouth 2 (two) times daily.   Yes [provider]  empagliflozin (JARDIANCE) 10 MG TABS tablet Take 10 mg by mouth daily. 10/19/20  Yes [provider]  famotidine (PEPCID) 20 MG tablet Take 20 mg by mouth 2 (two) times daily. 08/30/22 10/29/22 Yes [provider]  Fluticasone-Umeclidin-Vilant (TRELEGY ELLIPTA) 200-62.5-25 MCG/ACT AEPB Inhale 1 puff into the lungs daily.   Yes [provider]  ipratropium-albuterol (DUONEB) 0.5-2.5 (3) MG/3ML SOLN Take 3 mLs by nebulization every 6 (six) hours as needed. 09/29/22  Yes Darlin Priestly, MD  sildenafil (REVATIO) 20 MG tablet Take 20 mg by mouth 3 (three) times daily. 04/19/22  Yes [provider]  spironolactone (ALDACTONE) 25 MG tablet Take 25 mg by mouth daily.   Yes [provider]  torsemide (DEMADEX) 20 MG tablet Take 40 mg by mouth 2 (two) times daily. 05/10/22 05/10/23 Yes [provider]  TYVASO REFILL 0.6 MG/ML SOLN Inhale 18 mcg into the lungs every 4 (four) hours while awake. 11/30/21  Yes [provider]     Critical care time: 68 minutes       Betsey Holiday, AGACNP-BC Acute Care Nurse Practitioner [facility]   [phone] / [phone] Please see Amion for pager details.

## 2022-10-12 ENCOUNTER — Other Ambulatory Visit: Payer: Self-pay

## 2022-10-12 ENCOUNTER — Encounter
Admission: EM | Disposition: A | Discharge status: Home / Self Care | Payer: Self-pay | Source: Ambulatory Visit | Attending: Family Medicine

## 2022-10-12 DIAGNOSIS — I50810 Right heart failure, unspecified: Secondary | ICD-10-CM

## 2022-10-12 DIAGNOSIS — I5033 Acute on chronic diastolic (congestive) heart failure: Secondary | ICD-10-CM

## 2022-10-12 DIAGNOSIS — J9621 Acute and chronic respiratory failure with hypoxia: Secondary | ICD-10-CM

## 2022-10-12 HISTORY — PX: RIGHT HEART CATH: CATH118263

## 2022-10-12 LAB — POCT I-STAT EG7
Acid-Base Excess: 1 mmol/L (ref 0.0–2.0)
Acid-Base Excess: 1 mmol/L (ref 0.0–2.0)
Acid-Base Excess: 1 mmol/L (ref 0.0–2.0)
Bicarbonate: 26.6 mmol/L (ref 20.0–28.0)
Bicarbonate: 26.9 mmol/L (ref 20.0–28.0)
Bicarbonate: 26.5 mmol/L (ref 20.0–28.0)
Calcium, Ion: 1.21 mmol/L (ref 1.15–1.40)
Calcium, Ion: 1.24 mmol/L (ref 1.15–1.40)
Calcium, Ion: 1.18 mmol/L (ref 1.15–1.40)
HCT: 50 % — ABNORMAL HIGH (ref 36.0–46.0)
HCT: 51 % — ABNORMAL HIGH (ref 36.0–46.0)
HCT: 50 % — ABNORMAL HIGH (ref 36.0–46.0)
Hemoglobin: 17 g/dL — ABNORMAL HIGH (ref 12.0–15.0)
Hemoglobin: 17.3 g/dL — ABNORMAL HIGH (ref 12.0–15.0)
Hemoglobin: 17 g/dL — ABNORMAL HIGH (ref 12.0–15.0)
O2 Saturation: 63 %
O2 Saturation: 65 %
O2 Saturation: 62 %
Potassium: 4 mmol/L (ref 3.5–5.1)
Potassium: 4.1 mmol/L (ref 3.5–5.1)
Potassium: 3.9 mmol/L (ref 3.5–5.1)
Sodium: 138 mmol/L (ref 135–145)
Sodium: 138 mmol/L (ref 135–145)
Sodium: 139 mmol/L (ref 135–145)
TCO2: 28 mmol/L (ref 22–32)
TCO2: 28 mmol/L (ref 22–32)
TCO2: 28 mmol/L (ref 22–32)
pCO2, Ven: 43.9 mmHg — ABNORMAL LOW (ref 44–60)
pCO2, Ven: 44.6 mmHg (ref 44–60)
pCO2, Ven: 43.1 mmHg — ABNORMAL LOW (ref 44–60)
pH, Ven: 7.389 (ref 7.25–7.43)
pH, Ven: 7.39 (ref 7.25–7.43)
pH, Ven: 7.397 (ref 7.25–7.43)
pO2, Ven: 33 mmHg (ref 32–45)
pO2, Ven: 34 mmHg (ref 32–45)
pO2, Ven: 33 mmHg (ref 32–45)

## 2022-10-12 LAB — COMPREHENSIVE METABOLIC PANEL
ALT: 18 U/L (ref 0–44)
AST: 34 U/L (ref 15–41)
Albumin: 4 g/dL (ref 3.5–5.0)
Alkaline Phosphatase: 121 U/L (ref 38–126)
Anion gap: 11 (ref 5–15)
BUN: 20 mg/dL (ref 6–20)
CO2: 23 mmol/L (ref 22–32)
Calcium: 9.6 mg/dL (ref 8.9–10.3)
Chloride: 100 mmol/L (ref 98–111)
Creatinine, Ser: 1.28 mg/dL — ABNORMAL HIGH (ref 0.44–1.00)
GFR, Estimated: 60 mL/min — ABNORMAL LOW (ref >60)
Glucose, Bld: 144 mg/dL — ABNORMAL HIGH (ref 70–99)
Potassium: 4.3 mmol/L (ref 3.5–5.1)
Sodium: 134 mmol/L — ABNORMAL LOW (ref 135–145)
Total Bilirubin: 4.2 mg/dL — ABNORMAL HIGH (ref 0.3–1.2)
Total Protein: 7.9 g/dL (ref 6.5–8.1)

## 2022-10-12 LAB — BASIC METABOLIC PANEL
Anion gap: 12 (ref 5–15)
BUN: 20 mg/dL (ref 6–20)
CO2: 27 mmol/L (ref 22–32)
Calcium: 9.8 mg/dL (ref 8.9–10.3)
Chloride: 96 mmol/L — ABNORMAL LOW (ref 98–111)
Creatinine, Ser: 1.09 mg/dL — ABNORMAL HIGH (ref 0.44–1.00)
GFR, Estimated: >60 mL/min (ref >60)
Glucose, Bld: 123 mg/dL — ABNORMAL HIGH (ref 70–99)
Potassium: 4.3 mmol/L (ref 3.5–5.1)
Sodium: 135 mmol/L (ref 135–145)

## 2022-10-12 LAB — CBC WITH DIFFERENTIAL/PLATELET
Abs Immature Granulocytes: 0.08 10*3/uL — ABNORMAL HIGH (ref 0.00–0.07)
Basophils Absolute: 0 10*3/uL (ref 0.0–0.1)
Basophils Relative: 0 %
Eosinophils Absolute: 0 10*3/uL (ref 0.0–0.5)
Eosinophils Relative: 0 %
HCT: 43.5 % (ref 36.0–46.0)
Hemoglobin: 15.5 g/dL — ABNORMAL HIGH (ref 12.0–15.0)
Immature Granulocytes: 1 %
Lymphocytes Relative: 10 %
Lymphs Abs: 1.5 10*3/uL (ref 0.7–4.0)
MCH: 27.4 pg (ref 26.0–34.0)
MCHC: 35.6 g/dL (ref 30.0–36.0)
MCV: 76.9 fL — ABNORMAL LOW (ref 80.0–100.0)
Monocytes Absolute: 0.8 10*3/uL (ref 0.1–1.0)
Monocytes Relative: 5 %
Neutro Abs: 13.1 10*3/uL — ABNORMAL HIGH (ref 1.7–7.7)
Neutrophils Relative %: 84 %
Platelets: 308 10*3/uL (ref 150–400)
RBC: 5.66 MIL/uL — ABNORMAL HIGH (ref 3.87–5.11)
RDW: 19.9 % — ABNORMAL HIGH (ref 11.5–15.5)
Smear Review: NORMAL
WBC: 15.4 10*3/uL — ABNORMAL HIGH (ref 4.0–10.5)
nRBC: 0.5 % — ABNORMAL HIGH (ref 0.0–0.2)

## 2022-10-12 LAB — BLOOD GAS, VENOUS
Acid-Base Excess: 3.1 mmol/L — ABNORMAL HIGH (ref 0.0–2.0)
Bicarbonate: 27 mmol/L (ref 20.0–28.0)
O2 Saturation: 65.5 %
Patient temperature: 37
pCO2, Ven: 38 mmHg — ABNORMAL LOW (ref 44–60)
pH, Ven: 7.46 — ABNORMAL HIGH (ref 7.25–7.43)
pO2, Ven: 40 mmHg (ref 32–45)

## 2022-10-12 LAB — AEROBIC/ANAEROBIC CULTURE W GRAM STAIN (SURGICAL/DEEP WOUND): Gram Stain: NONE SEEN

## 2022-10-12 LAB — MAGNESIUM
Magnesium: 2.5 mg/dL — ABNORMAL HIGH (ref 1.7–2.4)
Magnesium: 2.6 mg/dL — ABNORMAL HIGH (ref 1.7–2.4)

## 2022-10-12 LAB — PHOSPHORUS
Phosphorus: 4.1 mg/dL (ref 2.5–4.6)
Phosphorus: 4.1 mg/dL (ref 2.5–4.6)

## 2022-10-12 LAB — APTT
aPTT: 75 seconds — ABNORMAL HIGH (ref 24–36)
aPTT: 34 seconds (ref 24–36)

## 2022-10-12 LAB — LACTIC ACID, PLASMA: Lactic Acid, Venous: 2 mmol/L (ref 0.5–1.9)

## 2022-10-12 LAB — HEPARIN LEVEL (UNFRACTIONATED): Heparin Unfractionated: >1.1 IU/mL — ABNORMAL HIGH (ref 0.30–0.70)

## 2022-10-12 LAB — PROTIME-INR
INR: 1.7 — ABNORMAL HIGH (ref 0.8–1.2)
Prothrombin Time: 20 seconds — ABNORMAL HIGH (ref 11.4–15.2)

## 2022-10-12 SURGERY — RIGHT HEART CATH
Anesthesia: Moderate Sedation

## 2022-10-12 MED ORDER — SILDENAFIL CITRATE 20 MG PO TABS
40.0000 mg | ORAL_TABLET | Freq: Three times a day (TID) | ORAL | Status: DC
  Administered 2022-10-12 – 2022-10-17 (×15): 40 mg via ORAL
  Filled 2022-10-12 (×16): qty 2

## 2022-10-12 MED ORDER — LABETALOL HCL 5 MG/ML IV SOLN: 10.0000 mg | INTRAVENOUS | Status: DC | PRN

## 2022-10-12 MED ORDER — FUROSEMIDE 10 MG/ML IJ SOLN
12.0000 mg/h | INTRAVENOUS | Status: DC
  Administered 2022-10-12 – 2022-10-15 (×5): 12 mg/h via INTRAVENOUS
  Filled 2022-10-12 (×7): qty 20

## 2022-10-12 MED ORDER — FENTANYL CITRATE (PF) 100 MCG/2ML IJ SOLN
INTRAMUSCULAR | Status: DC | PRN
  Administered 2022-10-12: 25 ug via INTRAVENOUS

## 2022-10-12 MED ORDER — DIGOXIN 125 MCG PO TABS
0.1250 mg | ORAL_TABLET | Freq: Every day | ORAL | Status: DC
  Administered 2022-10-12 – 2022-10-17 (×6): 0.125 mg via ORAL
  Filled 2022-10-12 (×6): qty 1

## 2022-10-12 MED ORDER — SODIUM CHLORIDE 0.9% FLUSH
10.0000 mL | Freq: Two times a day (BID) | INTRAVENOUS | Status: DC
  Administered 2022-10-12 – 2022-10-17 (×11): 10 mL

## 2022-10-12 MED ORDER — SODIUM CHLORIDE 0.9 % IV SOLN: INTRAVENOUS | Status: DC

## 2022-10-12 MED ORDER — HEPARIN (PORCINE) IN NACL 1000-0.9 UT/500ML-% IV SOLN
INTRAVENOUS | Status: DC | PRN
  Administered 2022-10-12 (×2): 500 mL

## 2022-10-12 MED ORDER — HYDROCORTISONE 1 % EX CREA
TOPICAL_CREAM | CUTANEOUS | Status: DC | PRN
  Filled 2022-10-12: qty 28

## 2022-10-12 MED ORDER — SODIUM CHLORIDE 0.9% FLUSH
3.0000 mL | Freq: Two times a day (BID) | INTRAVENOUS | Status: DC
  Administered 2022-10-12 – 2022-10-16 (×9): 3 mL via INTRAVENOUS

## 2022-10-12 MED ORDER — LIDOCAINE HCL (PF) 1 % IJ SOLN
INTRAMUSCULAR | Status: DC | PRN
  Administered 2022-10-12: 2 mL

## 2022-10-12 MED ORDER — APIXABAN 5 MG PO TABS
5.0000 mg | ORAL_TABLET | Freq: Two times a day (BID) | ORAL | Status: DC
  Administered 2022-10-12 – 2022-10-17 (×11): 5 mg via ORAL
  Filled 2022-10-12 (×11): qty 1

## 2022-10-12 MED ORDER — MORPHINE SULFATE (PF) 2 MG/ML IV SOLN
2.0000 mg | Freq: Once | INTRAVENOUS | Status: AC
  Administered 2022-10-12: 2 mg via INTRAVENOUS
  Filled 2022-10-12: qty 1

## 2022-10-12 MED ORDER — SODIUM CHLORIDE 0.9% FLUSH: 10.0000 mL | INTRAVENOUS | Status: DC | PRN

## 2022-10-12 MED ORDER — MIDAZOLAM HCL 2 MG/2ML IJ SOLN
INTRAMUSCULAR | Status: AC
  Filled 2022-10-12: qty 2

## 2022-10-12 MED ORDER — HEPARIN (PORCINE) IN NACL 1000-0.9 UT/500ML-% IV SOLN
INTRAVENOUS | Status: AC
  Filled 2022-10-12: qty 1000

## 2022-10-12 MED ORDER — MILRINONE LACTATE IN DEXTROSE 20-5 MG/100ML-% IV SOLN
0.1250 ug/kg/min | INTRAVENOUS | Status: DC
  Administered 2022-10-12 – 2022-10-15 (×3): 0.125 ug/kg/min via INTRAVENOUS
  Filled 2022-10-12 (×6): qty 100

## 2022-10-12 MED ORDER — SODIUM CHLORIDE 0.9 % IV SOLN: 250.0000 mL | INTRAVENOUS | Status: DC | PRN

## 2022-10-12 MED ORDER — SODIUM CHLORIDE 0.9% FLUSH: 3.0000 mL | INTRAVENOUS | Status: DC | PRN

## 2022-10-12 MED ORDER — SODIUM CHLORIDE 0.9% FLUSH
3.0000 mL | INTRAVENOUS | Status: DC | PRN
  Administered 2022-10-12 – 2022-10-14 (×3): 3 mL via INTRAVENOUS

## 2022-10-12 MED ORDER — FENTANYL CITRATE (PF) 100 MCG/2ML IJ SOLN
INTRAMUSCULAR | Status: AC
  Filled 2022-10-12: qty 2

## 2022-10-12 MED ORDER — HYDRALAZINE HCL 20 MG/ML IJ SOLN: 10.0000 mg | INTRAMUSCULAR | Status: AC | PRN

## 2022-10-12 MED ORDER — POTASSIUM CHLORIDE CRYS ER 20 MEQ PO TBCR
40.0000 meq | EXTENDED_RELEASE_TABLET | Freq: Once | ORAL | Status: AC
  Administered 2022-10-12: 40 meq via ORAL
  Filled 2022-10-12: qty 2

## 2022-10-12 MED ORDER — ACETAMINOPHEN 325 MG PO TABS: 650.0000 mg | ORAL_TABLET | ORAL | Status: DC | PRN

## 2022-10-12 MED ORDER — ONDANSETRON HCL 4 MG/2ML IJ SOLN
4.0000 mg | Freq: Four times a day (QID) | INTRAMUSCULAR | Status: DC | PRN
  Administered 2022-10-16: 4 mg via INTRAVENOUS
  Filled 2022-10-12: qty 2

## 2022-10-12 MED ORDER — OXYCODONE HCL 5 MG PO TABS
5.0000 mg | ORAL_TABLET | Freq: Once | ORAL | Status: AC
  Administered 2022-10-12: 5 mg via ORAL
  Filled 2022-10-12: qty 1

## 2022-10-12 MED ORDER — METOLAZONE 2.5 MG PO TABS
2.5000 mg | ORAL_TABLET | Freq: Once | ORAL | Status: AC
  Administered 2022-10-12: 2.5 mg via ORAL
  Filled 2022-10-12: qty 1

## 2022-10-12 SURGICAL SUPPLY — 9 items
CANNULA 5F STIFF (CANNULA) IMPLANT
CATH BALLN WEDGE 5F 110CM (CATHETERS) IMPLANT
DRAPE BRACHIAL (DRAPES) IMPLANT
PACK CARDIAC CATH (CUSTOM PROCEDURE TRAY) IMPLANT
PROTECTION STATION PRESSURIZED (MISCELLANEOUS) ×1
SET ATX SIMPLICITY (MISCELLANEOUS) IMPLANT
SHEATH GLIDE SLENDER 4/5FR (SHEATH) IMPLANT
STATION PROTECTION PRESSURIZED (MISCELLANEOUS) IMPLANT
WIRE NITINOL .018 (WIRE) IMPLANT

## 2022-10-12 NOTE — Interval H&P Note (Signed)
History and Physical Interval Note:  10/12/2022 8:46 AM  Yesenia Carrillo  has presented today for surgery, with the diagnosis of pulmonary hypertension.  The various methods of treatment have been discussed with the patient and family. After consideration of risks, benefits and other options for treatment, the patient has consented to  Procedure(s): RIGHT HEART CATH (N/A) as a surgical intervention.  The patient's history has been reviewed, patient examined, no change in status, stable for surgery.  I have reviewed the patient's chart and labs.  Questions were answered to the patient's satisfaction.     Hava Massingale [employer]

## 2022-10-12 NOTE — Progress Notes (Signed)
NAME:  Yesenia Carrillo, MRN:  [id number], DOB:  02-15-1996, LOS: 1 ADMISSION DATE:  10/10/2022, CHIEF COMPLAINT:  Respiratory Failure   History of Present Illness:  27 yo F with significant PMHx of PHTN, HFpEF, ILD, COPD & PE on Eliquis presenting to [facility] ED on 10/10/22 after being sent over from urgent care due to hypoxia.    History obtained per chart review and patient bedside report. Patient extremely drowsy after dilaudid  & benadryl IVP in ED. Poor historian at this time. Patient reports feeling overall unwell with generalized fatigue over the past 1 week. She went to urgent care today for evaluation of a genital herpes outbreak and was found to be hypoxic in the 80's on baseline 8 L [place]. Patient endorsed dyspnea and productive cough in the ED, but denied dyspnea during my interview.  She does report diffuse/constant abdominal pain over the last 3 days. She told ED provider her feet appear more swollen, but has not been checking her weight. She endorsed pain with urination due to her genital herpes outbreak, as well as some discharge. She denies nausea/vomiting, but reported some diarrhea to ED provider. Denied chest pain and states she has been compliant with all medication including Eliquis. She stated that at home she has not been monitoring her SpO2 but titrated her oxygen up to 8 L [place] due to dyspnea, she is unsure how long it has been at 8 L. Her most recent pulmonology appt showed her at 6 L [place] outpatient. She states it has been "a while" since she has seen her cardiologist. She did have a sleep study done, and now has a BIPAP she wears nightly then 8 L [place] during the day. Unable to discuss ETOH/tobacco/recreational drug use history at this time.   ED course: Upon arrival patient significantly hypoxic on home 8L [place] at 68%. She was placed on a NRB and ultimately HHFNC at 75% 40 L. Patient appearing chronically ill without significant work of breathing. She was given Dilaudid for abdominal pain.  Labs  significant for hypokalemia, elevated but flat troponin suggestive of demand ischemia, elevated BNP, lactic acidosis & leukocytosis but negative PCT. Imaging ruled out PE, demonstrated continued GGO & pericardial effusion, possible cholecystitis that was ruled out on [place] and possible cellulitis that can't be clinically correlated with exam. Herpes outbreak present.   Pertinent  Medical History  PH (group 2 & 3) HFpEF COPD  HTN Provoked PE on eliquis Splenic vein thrombosis Splenic infarcts  Recurrent episodes of respiratory failure (multiple intubations & ECMO 2019-2022) Chronic idiopathic leukocytosis/thrombocytosis Tobacco abuse Obesity Sickle cell trait COVID-19 ILD  Significant Hospital Events: Including procedures, antibiotic start and stop dates in addition to other pertinent events   10/11/22: Admit to SDU due to acute on chronic hypoxic respiratory failure multifocal in the setting of mild HFpEF exacerbation, ILD, PHTN & COPD as well as HSV outbreak. 5/9: plan for RHC   Interim History / Subjective:  Respiratory symptoms unchanged. Some pain in the genital area persists.  Objective   Blood pressure 107/88, pulse (!) 107, temperature 98 F (36.7 C), temperature source Oral, resp. rate 20, height 5\' 1"  (1.549 m), weight 108.7 kg, last menstrual period 06/11/2022, SpO2 (!) 88 %, unknown if currently breastfeeding.    FiO2 (%):  [65 %-67 %] 65 %   Intake/Output Summary (Last 24 hours) at 10/12/2022 0817 Last data filed at 10/11/2022 2145 Gross per 24 hour  Intake 488.37 ml  Output 1700 ml  Net -  1211.63 ml   Filed Weights   10/10/22 1919 10/11/22 0404 10/12/22 0702  Weight: 106.6 kg 106 kg 108.7 kg    Examination: General: no acute distress, resting in bed HEENT: [place]/AT, moist mucous membranes, sclera anicteric Neuro: A&O x 3, moving all extremities CV: rrr, s1s2 PULM: crackles on posterior auscultation GI: soft, non-tender, distended, BS+ Extremities: warm, no  edema   Assessment & Plan:   27 year old female with history of pulmonary hypertension presenting to the hospital with acute on chronic hypoxic respiratory failure.   Neurology #Pain   Reports abdominal pain. Abdomen is soft and non-surgical with positive bowel sounds. Does have vaginal pain in the setting of her herpes outbreak.   -Continue Tylenol -continue topical lidocaine   Cardiovascular #Pulmonary Hypertension #HFpEF   She has group 2 and 3 pulmonary hypertension. RHC from 08/2020 with mean PA pressure of 43, PCWP 22, PVR 4 woods units while RHC from June of 2023 is mostly showing pre-capillary pulmonary hypertension (mean PA 56, PCWP 10, PVR 15 wood units). She was started on sildenafil and inhaled treprostinil.  I suspect she also has group 1 physiology likely due to persistently elevated PA pressures.   RHC from 2022 is consistent with HFpEF given the elevated PCWP and she is now presenting with increased oxygen requirements and a chest CT showing increased patchy consolidative opacities in the bases, new compared to imaging from April. I am concerned that this represents pulmonary edema more so than an infectious etiology, and have asked our heart failure specialist colleagues to evaluate her given pulmonary hypertension and RV dysfunction. She is being considered for a repeat RHC today to better understand her hemodynamics.   In the interim, we will continue with IV diuresis per recommendations from cardiology. We will also continue sildenafil and have asked the patient to bring in her home inhaled treprostinil.   -continue IV furosemide 80 mg bid -continue spironolactone 25 mg daily -RHC today -appreciate cardiology input   Pulmonary #Acute on Chronic Hypoxic Respiratory Failure #Pulmonary Hypertension #ILD   She has had multiple admissions in the past with recurrent respiratory failure in the settings of infection, and has had a workup that showed her to have heart  failure with preserved ejection fraction as well as pulmonary hypertension.  In addition to pulmonary hypertension, she is also being worked up for possible interstitial lung disease. Based on the report from the previous high resolution CT scans, the differential has included non-specific interstitial pneumonitis (NSIP), hypersensitivity pneumonitis (HSP), and organizing pneumonia (OP).    She was not deemed to be a candidate for surgical biopsy nor immune suppression by her outpatient providers and radiographic surveillance has been pursued. Her presumed ILD had not been steroid responsive in the past nor was she a candidate for biopsy (based on surgical risk); I have discontinued steroids started on admission. It is also my impression that the consolidative opacities in the bases are secondary to pulmonary edema, but we will obtain an infectious workup to rule out other causes (procal is negative, pending respiratory viral panel).   Continue with oxygen supplementation, and attempt to wean hi-flo nasal cannula with goal SpO2 of 88-92%.  -d/c steroids -hi-flo nasal cannula, goal SpO2 88 to 92% -continue diuresis -continue sildenafil -continue inhaled Treprostinil   Gastrointestinal   No active issues, does report abdominal pain but her abdomen is soft, [place] is normal, and there are positive bowel sounds. Bilirubin is elevated, which could be due to congestive hepatopathy, AST/ALT  are normal.   -low salt diet, NPO for RHC -daily LFT's   Renal   Monitor kidney function, replete electrolytes as indicated. Patient's baseline creatinine ranges between 0.8 and 1.2. Cardiorenal syndrome is always a possibility and we will await the results from her RHC for better assessment of perfusion.  -continue IV diuresis -twice daily electrolytes   Endocrine  ICU glycemic protocol  Hem/Onc #History of PE #Sickle Cell Train  On Apixaban given history of PE in the past. Will switch to heparin prior  to RHC  ID #Genital HSV   On valacyclovir given outbreak, continue  Best Practice (right click and Programmer, systems" daily)   Diet/type: NPO DVT prophylaxis: systemic heparin GI prophylaxis: N/A Lines: N/A Foley:  N/A Code Status:  full code Last date of multidisciplinary goals of care discussion [10/12/2022]  Labs   CBC: Recent Labs  Lab 10/10/22 1947 10/11/22 0517 10/12/22 0603  WBC 15.6* 17.4* 15.4*  NEUTROABS 9.8*  --  13.1*  HGB 15.7* 15.4* 15.5*  HCT 45.3 46.1* 43.5  MCV 78.4* 79.9* 76.9*  PLT 334 290 308    Basic Metabolic Panel: Recent Labs  Lab 10/10/22 2047 10/11/22 0517 10/11/22 1732 10/12/22 0603  NA 137 136 135 134*  K 3.2* 3.6 3.7 4.3  CL 102 101 101 100  CO2 24 23 24 23   GLUCOSE 80 91 153* 144*  BUN 9 12 13 20   CREATININE 0.92 1.18* 1.22* 1.28*  CALCIUM 8.7* 9.2 9.5 9.6  MG  --  2.6* 2.2 2.5*  PHOS  --  5.8* 4.4 4.1   GFR: Estimated Creatinine Clearance: 76.6 mL/min (A) (by C-G formula based on SCr of 1.28 mg/dL (H)). Recent Labs  Lab 10/10/22 1947 10/10/22 2047 10/11/22 0025 10/11/22 0420 10/11/22 0517 10/11/22 0905 10/11/22 1318 10/11/22 1732 10/12/22 0603  PROCALCITON  --  <0.10  --  0.13  --   --   --   --   --   WBC 15.6*  --   --   --  17.4*  --   --   --  15.4*  LATICACIDVEN  --   --    < >  --  3.5* 2.2* 1.6 2.3* 2.0*   < > = values in this interval not displayed.    Liver Function Tests: Recent Labs  Lab 10/10/22 2047 10/12/22 0603  AST 29 34  ALT 18 18  ALKPHOS 109 121  BILITOT 5.8* 4.2*  PROT 7.3 7.9  ALBUMIN 3.6 4.0   Recent Labs  Lab 10/10/22 2047  LIPASE 25   No results for input(s): "AMMONIA" in the last 168 hours.  ABG    Component Value Date/Time   PHART 7.44 10/29/2019 1224   PCO2ART 37 10/29/2019 1224   PO2ART 60 (L) 10/29/2019 1224   HCO3 29.1 (H) 10/10/2022 2226   ACIDBASEDEF 0.6 10/30/2019 0649   O2SAT 63.2 10/10/2022 2226     Coagulation Profile: Recent Labs  Lab  10/11/22 1726 10/12/22 0603  INR 2.0* 1.7*    Cardiac Enzymes: No results for input(s): "CKTOTAL", "CKMB", "CKMBINDEX", "TROPONINI" in the last 168 hours.  HbA1C: Hgb A1c MFr Bld  Date/Time Value Ref Range Status  08/09/2022 08:53 AM 6.1 (H) 4.8 - 5.6 % Final    Comment:    (NOTE)         Prediabetes: 5.7 - 6.4         Diabetes: >6.4         Glycemic control  for adults with diabetes: <7.0   05/24/2022 03:02 AM 6.2 (H) 4.8 - 5.6 % Final    Comment:    (NOTE)         Prediabetes: 5.7 - 6.4         Diabetes: >6.4         Glycemic control for adults with diabetes: <7.0     CBG: Recent Labs  Lab 10/11/22 0236  GLUCAP 122*    Past Medical History:  She,  has a past medical history of CHF (congestive heart failure) (HCC), COVID-19 (04/2019), Hypertension, Interstitial lung disease (HCC), Morbid obesity with BMI of 40.0-44.9, adult (HCC), and Pre-eclampsia.   Surgical History:   Past Surgical History:  Procedure Laterality Date   ECMO CANNULATION     TONSILLECTOMY     WISDOM TOOTH EXTRACTION       Social History:   reports that she quit smoking about 6 years ago. Her smoking use included cigarettes. She has never used smokeless tobacco. She reports that she does not currently use alcohol. She reports that she does not use drugs.   Family History:  Her family history includes Healthy in her father and mother.   Allergies Allergies  Allergen Reactions   Ampicillin Rash and Other (See Comments)    Patient and family cannot remember the specifics of the reaction.   Penicillins Hives     Home Medications  Prior to Admission medications   Medication Sig Start Date End Date Taking? Authorizing Provider  albuterol (VENTOLIN HFA) 108 (90 Base) MCG/ACT inhaler Inhale 1-2 puffs into the lungs every 6 (six) hours as needed for wheezing or shortness of breath. 07/25/21  Yes [provider]  ELIQUIS 5 MG TABS tablet Take 5 mg by mouth 2 (two) times daily.   Yes  [provider]  empagliflozin (JARDIANCE) 10 MG TABS tablet Take 10 mg by mouth daily. 10/19/20  Yes [provider]  famotidine (PEPCID) 20 MG tablet Take 20 mg by mouth 2 (two) times daily. 08/30/22 10/29/22 Yes [provider]  Fluticasone-Umeclidin-Vilant (TRELEGY ELLIPTA) 200-62.5-25 MCG/ACT AEPB Inhale 1 puff into the lungs daily.   Yes [provider]  ipratropium-albuterol (DUONEB) 0.5-2.5 (3) MG/3ML SOLN Take 3 mLs by nebulization every 6 (six) hours as needed. 09/29/22  Yes Darlin Priestly, MD  sildenafil (REVATIO) 20 MG tablet Take 20 mg by mouth 3 (three) times daily. 04/19/22  Yes [provider]  spironolactone (ALDACTONE) 25 MG tablet Take 25 mg by mouth daily.   Yes [provider]  torsemide (DEMADEX) 20 MG tablet Take 40 mg by mouth 2 (two) times daily. 05/10/22 05/10/23 Yes [provider]  TYVASO REFILL 0.6 MG/ML SOLN Inhale 18 mcg into the lungs every 4 (four) hours while awake. 11/30/21  Yes [provider]     Critical care time: 41 minutes     Raechel Chute, MD [facility] 10/12/2022 8:30 AM

## 2022-10-12 NOTE — Consult Note (Signed)
PHARMACY CONSULT NOTE - FOLLOW UP  Pharmacy Consult for Electrolyte Monitoring and Replacement   Recent Labs: Potassium (mmol/L)  Date Value  10/12/2022 4.3  06/06/2013 3.2 (L)   Magnesium (mg/dL)  Date Value  16/03/9603 2.5 (H)   Calcium (mg/dL)  Date Value  54/02/8118 9.6   Calcium, Total (mg/dL)  Date Value  14/78/2956 9.4   Albumin (g/dL)  Date Value  21/30/8657 4.0  06/06/2013 3.9   Phosphorus (mg/dL)  Date Value  84/69/6295 4.1   Sodium (mmol/L)  Date Value  10/12/2022 134 (L)  06/06/2013 139     Assessment: 27 yo F with significant PMHx of PHTN, HFpEF, ILD, COPD & PE on Eliquis presenting to [facility] ED on 10/10/22 after being sent over from urgent care due to hypoxia. Admit to SDU due to acute on chronic hypoxic respiratory failure multifocal in the setting of mild HFpEF exacerbation, ILD, PHTN & COPD as well as HSV outbreak. Pharmacy has been consulted to monitor and replace electrolytes while under PCCM care.    Goal of Therapy:  Electrolytes WNL  Plan:  No replacement currently indicated Will recheck electrolytes tomorrow with AM labs  Bettey Costa ,PharmD Clinical Pharmacist 10/12/2022 7:39 AM

## 2022-10-12 NOTE — H&P (View-Only) (Signed)
Patient ID: Yesenia Carrillo, female   DOB: 1996/04/20, 27 y.o.   MRN: [id number]     Advanced Heart Failure Rounding Note  PCP-Cardiologist: None   Subjective:    Diuresis with IV Lasix yesterday, I/Os net negative 1071. Lactate 2.3 => 2.0.  More alert this morning. Says breathing is getting better but still with early satiety.    Objective:   Weight Range: 108.7 kg Body mass index is 45.28 kg/m.   Vital Signs:   Temp:  [97.9 F (36.6 C)-98 F (36.7 C)] 98 F (36.7 C) (05/09 0800) Pulse Rate:  [98-116] 107 (05/09 0800) Resp:  [14-36] 20 (05/09 0800) BP: (96-126)/(61-90) 107/88 (05/09 0800) SpO2:  [81 %-100 %] 88 % (05/09 0800) FiO2 (%):  [65 %-67 %] 65 % (05/09 0736) Weight:  [108.7 kg] 108.7 kg (05/09 0702) Last BM Date :  (PTA)  Weight change: Filed Weights   10/10/22 1919 10/11/22 0404 10/12/22 0702  Weight: 106.6 kg 106 kg 108.7 kg    Intake/Output:   Intake/Output Summary (Last 24 hours) at 10/12/2022 0819 Last data filed at 10/11/2022 2145 Gross per 24 hour  Intake 488.37 ml  Output 1700 ml  Net -1211.63 ml      Physical Exam    General:  Well appearing. No resp difficulty HEENT: Normal Neck: Supple. JVP 14-16 cm. Carotids 2+ bilat; no bruits. No lymphadenopathy or thyromegaly appreciated. Cor: PMI nondisplaced. Regular rate & rhythm. Possible right-sided S3.  Lungs: Clear Abdomen: Soft, nontender, nondistended. No hepatosplenomegaly. No bruits or masses. Good bowel sounds. Extremities: No cyanosis, clubbing, rash, edema Neuro: Alert & orientedx3, cranial nerves grossly intact. moves all 4 extremities w/o difficulty. Affect pleasant   Telemetry   NSR 100s (personally reviewed)  Labs    CBC Recent Labs    10/10/22 1947 10/11/22 0517 10/12/22 0603  WBC 15.6* 17.4* 15.4*  NEUTROABS 9.8*  --  13.1*  HGB 15.7* 15.4* 15.5*  HCT 45.3 46.1* 43.5  MCV 78.4* 79.9* 76.9*  PLT 334 290 308   Basic Metabolic Panel Recent Labs    40/98/11 1732  10/12/22 0603  NA 135 134*  K 3.7 4.3  CL 101 100  CO2 24 23  GLUCOSE 153* 144*  BUN 13 20  CREATININE 1.22* 1.28*  CALCIUM 9.5 9.6  MG 2.2 2.5*  PHOS 4.4 4.1   Liver Function Tests Recent Labs    10/10/22 2047 10/12/22 0603  AST 29 34  ALT 18 18  ALKPHOS 109 121  BILITOT 5.8* 4.2*  PROT 7.3 7.9  ALBUMIN 3.6 4.0   Recent Labs    10/10/22 2047  LIPASE 25   Cardiac Enzymes No results for input(s): "CKTOTAL", "CKMB", "CKMBINDEX", "TROPONINI" in the last 72 hours.  BNP: BNP (last 3 results) Recent Labs    08/09/22 0501 09/26/22 2131 10/10/22 1947  BNP 251.2* 462.9* 399.9*    ProBNP (last 3 results) No results for input(s): "PROBNP" in the last 8760 hours.   D-Dimer No results for input(s): "DDIMER" in the last 72 hours. Hemoglobin A1C No results for input(s): "HGBA1C" in the last 72 hours. Fasting Lipid Panel No results for input(s): "CHOL", "HDL", "LDLCALC", "TRIG", "CHOLHDL", "LDLDIRECT" in the last 72 hours. Thyroid Function Tests No results for input(s): "TSH", "T4TOTAL", "T3FREE", "THYROIDAB" in the last 72 hours.  Invalid input(s): "FREET3"  Other results:   Imaging    ECHOCARDIOGRAM COMPLETE  Result Date: 10/11/2022    ECHOCARDIOGRAM REPORT   Patient Name:   Yesenia Carrillo  Date of Exam: 10/11/2022 Medical Rec #:  [id number]    Height:       61.0 in Accession #:    [id number]   Weight:       233.7 lb Date of Birth:  11-03-1995    BSA:          2.018 m Patient Age:    26 years     BP:           103/61 mmHg Patient Gender: F            HR:           105 bpm. Exam Location:  [facility] Procedure: 2D Echo, Cardiac Doppler and Color Doppler Indications:     Pericardial Effusion I31.3  History:         Patient has prior history of Echocardiogram examinations, most                  recent 05/26/2022. CHF; Risk Factors:Hypertension.  Sonographer:     Cristela Blue Referring Phys:  [id number] BRITTON L R[place] Diagnosing Phys: Freida Busman McleanMD IMPRESSIONS  1. Left  ventricular ejection fraction, by estimation, is 55 to 60%. The left ventricle has normal function. The left ventricle has no regional wall motion abnormalities. There is mild concentric left ventricular hypertrophy. Left ventricular diastolic parameters are indeterminate.  2. D-shaped septum suggestive of RV pressure/volume overload. IVC was not visualized. Peak RV-RA gradient 54 mmHg. Right ventricular systolic function is severely reduced. The right ventricular size is moderately enlarged.  3. Right atrial size was moderately dilated.  4. The mitral valve is normal in structure. No evidence of mitral valve regurgitation. No evidence of mitral stenosis.  5. The aortic valve is tricuspid. Aortic valve regurgitation is not visualized. No aortic stenosis is present.  6. A small pericardial effusion is present. The pericardial effusion is circumferential. FINDINGS  Left Ventricle: Left ventricular ejection fraction, by estimation, is 55 to 60%. The left ventricle has normal function. The left ventricle has no regional wall motion abnormalities. The left ventricular internal cavity size was small. There is mild concentric left ventricular hypertrophy. Left ventricular diastolic parameters are indeterminate. Right Ventricle: D-shaped septum suggestive of RV pressure/volume overload. IVC was not visualized. Peak RV-RA gradient 54 mmHg. The right ventricular size is moderately enlarged. No increase in right ventricular wall thickness. Right ventricular systolic function is severely reduced. Left Atrium: Left atrial size was normal in size. Right Atrium: Right atrial size was moderately dilated. Pericardium: A small pericardial effusion is present. The pericardial effusion is circumferential. Mitral Valve: The mitral valve is normal in structure. No evidence of mitral valve regurgitation. No evidence of mitral valve stenosis. MV peak gradient, 2.7 mmHg. The mean mitral valve gradient is 2.0 mmHg. Tricuspid Valve: The  tricuspid valve is normal in structure. Tricuspid valve regurgitation is mild. Aortic Valve: The aortic valve is tricuspid. Aortic valve regurgitation is not visualized. No aortic stenosis is present. Aortic valve mean gradient measures 1.0 mmHg. Aortic valve peak gradient measures 2.2 mmHg. Aortic valve area, by VTI measures 3.34 cm. Pulmonic Valve: The pulmonic valve was normal in structure. Pulmonic valve regurgitation is trivial. Aorta: The aortic root is normal in size and structure. Venous: The inferior vena cava was not well visualized. IAS/Shunts: No atrial level shunt detected by color flow Doppler.  LEFT VENTRICLE PLAX 2D LVIDd:         3.10 cm   Diastology LVIDs:         2.20  cm   LV e' medial:    7.94 cm/s LV PW:         1.20 cm   LV E/e' medial:  8.4 LV IVS:        1.20 cm   LV e' lateral:   10.00 cm/s LVOT diam:     2.00 cm   LV E/e' lateral: 6.7 LV SV:         31 LV SV Index:   16 LVOT Area:     3.14 cm  RIGHT VENTRICLE RV Basal diam:  4.40 cm RV Mid diam:    4.30 cm RV S prime:     9.79 cm/s TAPSE (M-mode): 1.2 cm LEFT ATRIUM           Index       RIGHT ATRIUM           Index LA diam:      2.70 cm 1.34 cm/m  RA Area:     26.10 cm LA Vol (A4C): 19.6 ml 9.71 ml/m  RA Volume:   90.60 ml  44.90 ml/m  AORTIC VALVE AV Area (Vmax):    2.46 cm AV Area (Vmean):   2.22 cm AV Area (VTI):     3.34 cm AV Vmax:           74.60 cm/s AV Vmean:          54.300 cm/s AV VTI:            0.094 m AV Peak Grad:      2.2 mmHg AV Mean Grad:      1.0 mmHg LVOT Vmax:         58.30 cm/s LVOT Vmean:        38.400 cm/s LVOT VTI:          0.100 m LVOT/AV VTI ratio: 1.06  AORTA Ao Root diam: 2.60 cm MITRAL VALVE               TRICUSPID VALVE MV Area (PHT): 10.25 cm   TR Peak grad:   53.6 mmHg MV Area VTI:   2.34 cm    TR Vmax:        366.00 cm/s MV Peak grad:  2.7 mmHg MV Mean grad:  2.0 mmHg    SHUNTS MV Vmax:       0.81 m/s    Systemic VTI:  0.10 m MV Vmean:      58.9 cm/s   Systemic Diam: 2.00 cm MV Decel Time: 74  msec MV E velocity: 66.60 cm/s MV A velocity: 59.20 cm/s MV E/A ratio:  1.13 Otisha Spickler McleanMD Electronically signed by Wilfred Lacy Signature Date/Time: 10/11/2022/1:31:26 PM    Final      Medications:     Scheduled Medications:  acetaminophen  1,000 mg Oral Q6H   budesonide (PULMICORT) nebulizer solution  0.25 mg Nebulization BID   Chlorhexidine Gluconate Cloth  6 each Topical Daily   famotidine  20 mg Oral BID   furosemide  80 mg Intravenous BID   ipratropium-albuterol  3 mL Nebulization TID   sildenafil  20 mg Oral TID   sodium chloride flush  3 mL Intravenous Q12H   spironolactone  25 mg Oral Daily   Treprostinil  18 mcg Inhalation Q4H while awake   valACYclovir  1,000 mg Oral BID    Infusions:  sodium chloride     [START ON 10/13/2022] sodium chloride     heparin 1,050 Units/hr (10/11/22 2331)    PRN Medications:  sodium chloride, calcium carbonate, docusate sodium, lidocaine, mouth rinse, polyethylene glycol, sodium chloride flush   Assessment/Plan   1. Acute on chronic hypoxemic respiratory failure: Patient is on home oxygen 8L [place] at baseline, now requiring HFNC.  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 PFTs have shown restriction with severely decreased DLCO and serial CTs have shown diffuse GGOs and bibasilar scattered consolidation.  Suspect some form of inflammatory pneumonitis, probably not hypersensitivity pneumonitis as there has not been much response to steroids. She was thought to be too high risk for lung biopsy. CTA chest this admission with no PE, diffuse GGO.  Suspect that there is a significant component of CHF/volume overload/HFpEF.  Procalcitonin not significantly elevated and respiratory viral panel negative.  - Steroids stopped due to lack of response in the past.  - No antibiotics at this time.  - Continue diuresis with Lasix 80 mg IV bid and spironolactone 25 mg daily.   2. 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  There is evidence for RV failure on exam. Suspect some of GGO on CTA chest is related to pulmonary edema.  Lactate still mildly elevated at 2.2 => 2.0, worrisome for poor cardiac output in setting of RV failure. She had IV Lasix yesterday with modest response, still volume overloaded on exam.  - Lasix 80 mg IV bid + spironolactone as above. - With GU infection (herpes flare), would hold off on SGLT2 inhibitor.  - Will arrange for RHC today to assess filling pressures, cardiac output, and PA pressure.  She may benefit from milrinone for RV support while we diurese (may need more aggressive diuresis).  Discussed risks/benefits with patient and she agrees to the procedure.  3. 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  I think this is predominantly groups 2 and 3 PH from HFpEF, parenchymal lung disease (ILD process) and OSA. She is already on 8L home oxygen and I worry that she is heading towards end stage RV failure. RHC in 2022 per report had elevated PCWP.  Most recent RHC in 6/23 showed mean RA 10, PA 90/40 mean 58, mean PCWP 10, CI 1.5. Low cardiac output is concerning. As above, she has signs of RV failure on exam and elevated lactate. She has been on sildenafil and Tyvaso at home, does not think they have helped much.  - RHC today to assess filling pressures, cardiac output, and PA pressure.  - She bring in her home Tyvaso.  Will also continue sildenafil for now.  4. Abdominal discomfort: AST/ALT normal, RUQ [place] with no evidence for cholecystectomy.  Elevated bilirubin may be suggestive of hepatic congestion from RV failure. Suspect her early  satiety is related to RV failure.  5. 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  She has been anticoagulated.  - Would hold apixaban for cath, cover with heparin gtt. Restart apixaban afterwards.     Length of Stay: 1  Marca Ancona, MD  10/12/2022, 8:19 AM  Advanced Heart Failure Team Pager [phone] (M-F; 7a - 5p)  Please contact [facility] for night-coverage after hours (5p -7a ) and weekends on [email or web address]

## 2022-10-12 NOTE — Progress Notes (Addendum)
Patient ID: Yesenia Carrillo, female   DOB: 17-Dec-1995, 27 y.o.   MRN: [id number]     Advanced Heart Failure Rounding Note  PCP-Cardiologist: None   Subjective:    Diuresis with IV Lasix yesterday, I/Os net negative 1071. Lactate 2.3 => 2.0.  More alert this morning. Says breathing is getting better but still with early satiety.    Objective:   Weight Range: 108.7 kg Body mass index is 45.28 kg/m.   Vital Signs:   Temp:  [97.9 F (36.6 C)-98 F (36.7 C)] 98 F (36.7 C) (05/09 0800) Pulse Rate:  [98-116] 107 (05/09 0800) Resp:  [14-36] 20 (05/09 0800) BP: (96-126)/(61-90) 107/88 (05/09 0800) SpO2:  [81 %-100 %] 88 % (05/09 0800) FiO2 (%):  [65 %-67 %] 65 % (05/09 0736) Weight:  [108.7 kg] 108.7 kg (05/09 0702) Last BM Date :  (PTA)  Weight change: Filed Weights   10/10/22 1919 10/11/22 0404 10/12/22 0702  Weight: 106.6 kg 106 kg 108.7 kg    Intake/Output:   Intake/Output Summary (Last 24 hours) at 10/12/2022 0819 Last data filed at 10/11/2022 2145 Gross per 24 hour  Intake 488.37 ml  Output 1700 ml  Net -1211.63 ml      Physical Exam    General:  Well appearing. No resp difficulty HEENT: Normal Neck: Supple. JVP 14-16 cm. Carotids 2+ bilat; no bruits. No lymphadenopathy or thyromegaly appreciated. Cor: PMI nondisplaced. Regular rate & rhythm. Possible right-sided S3.  Lungs: Clear Abdomen: Soft, nontender, nondistended. No hepatosplenomegaly. No bruits or masses. Good bowel sounds. Extremities: No cyanosis, clubbing, rash, edema Neuro: Alert & orientedx3, cranial nerves grossly intact. moves all 4 extremities w/o difficulty. Affect pleasant   Telemetry   NSR 100s (personally reviewed)  Labs    CBC Recent Labs    10/10/22 1947 10/11/22 0517 10/12/22 0603  WBC 15.6* 17.4* 15.4*  NEUTROABS 9.8*  --  13.1*  HGB 15.7* 15.4* 15.5*  HCT 45.3 46.1* 43.5  MCV 78.4* 79.9* 76.9*  PLT 334 290 308   Basic Metabolic Panel Recent Labs    40/98/11 1732  10/12/22 0603  NA 135 134*  K 3.7 4.3  CL 101 100  CO2 24 23  GLUCOSE 153* 144*  BUN 13 20  CREATININE 1.22* 1.28*  CALCIUM 9.5 9.6  MG 2.2 2.5*  PHOS 4.4 4.1   Liver Function Tests Recent Labs    10/10/22 2047 10/12/22 0603  AST 29 34  ALT 18 18  ALKPHOS 109 121  BILITOT 5.8* 4.2*  PROT 7.3 7.9  ALBUMIN 3.6 4.0   Recent Labs    10/10/22 2047  LIPASE 25   Cardiac Enzymes No results for input(s): "CKTOTAL", "CKMB", "CKMBINDEX", "TROPONINI" in the last 72 hours.  BNP: BNP (last 3 results) Recent Labs    08/09/22 0501 09/26/22 2131 10/10/22 1947  BNP 251.2* 462.9* 399.9*    ProBNP (last 3 results) No results for input(s): "PROBNP" in the last 8760 hours.   D-Dimer No results for input(s): "DDIMER" in the last 72 hours. Hemoglobin A1C No results for input(s): "HGBA1C" in the last 72 hours. Fasting Lipid Panel No results for input(s): "CHOL", "HDL", "LDLCALC", "TRIG", "CHOLHDL", "LDLDIRECT" in the last 72 hours. Thyroid Function Tests No results for input(s): "TSH", "T4TOTAL", "T3FREE", "THYROIDAB" in the last 72 hours.  Invalid input(s): "FREET3"  Other results:   Imaging    ECHOCARDIOGRAM COMPLETE  Result Date: 10/11/2022    ECHOCARDIOGRAM REPORT   Patient Name:   Yesenia Carrillo  Date of Exam: 10/11/2022 Medical Rec #:  [id number]    Height:       61.0 in Accession #:    [id number]   Weight:       233.7 lb Date of Birth:  11-03-1995    BSA:          2.018 m Patient Age:    26 years     BP:           103/61 mmHg Patient Gender: F            HR:           105 bpm. Exam Location:  [facility] Procedure: 2D Echo, Cardiac Doppler and Color Doppler Indications:     Pericardial Effusion I31.3  History:         Patient has prior history of Echocardiogram examinations, most                  recent 05/26/2022. CHF; Risk Factors:Hypertension.  Sonographer:     Cristela Blue Referring Phys:  [id number] BRITTON L RUST-CHESTER Diagnosing Phys: Freida Busman McleanMD IMPRESSIONS  1. Left  ventricular ejection fraction, by estimation, is 55 to 60%. The left ventricle has normal function. The left ventricle has no regional wall motion abnormalities. There is mild concentric left ventricular hypertrophy. Left ventricular diastolic parameters are indeterminate.  2. D-shaped septum suggestive of RV pressure/volume overload. IVC was not visualized. Peak RV-RA gradient 54 mmHg. Right ventricular systolic function is severely reduced. The right ventricular size is moderately enlarged.  3. Right atrial size was moderately dilated.  4. The mitral valve is normal in structure. No evidence of mitral valve regurgitation. No evidence of mitral stenosis.  5. The aortic valve is tricuspid. Aortic valve regurgitation is not visualized. No aortic stenosis is present.  6. A small pericardial effusion is present. The pericardial effusion is circumferential. FINDINGS  Left Ventricle: Left ventricular ejection fraction, by estimation, is 55 to 60%. The left ventricle has normal function. The left ventricle has no regional wall motion abnormalities. The left ventricular internal cavity size was small. There is mild concentric left ventricular hypertrophy. Left ventricular diastolic parameters are indeterminate. Right Ventricle: D-shaped septum suggestive of RV pressure/volume overload. IVC was not visualized. Peak RV-RA gradient 54 mmHg. The right ventricular size is moderately enlarged. No increase in right ventricular wall thickness. Right ventricular systolic function is severely reduced. Left Atrium: Left atrial size was normal in size. Right Atrium: Right atrial size was moderately dilated. Pericardium: A small pericardial effusion is present. The pericardial effusion is circumferential. Mitral Valve: The mitral valve is normal in structure. No evidence of mitral valve regurgitation. No evidence of mitral valve stenosis. MV peak gradient, 2.7 mmHg. The mean mitral valve gradient is 2.0 mmHg. Tricuspid Valve: The  tricuspid valve is normal in structure. Tricuspid valve regurgitation is mild. Aortic Valve: The aortic valve is tricuspid. Aortic valve regurgitation is not visualized. No aortic stenosis is present. Aortic valve mean gradient measures 1.0 mmHg. Aortic valve peak gradient measures 2.2 mmHg. Aortic valve area, by VTI measures 3.34 cm. Pulmonic Valve: The pulmonic valve was normal in structure. Pulmonic valve regurgitation is trivial. Aorta: The aortic root is normal in size and structure. Venous: The inferior vena cava was not well visualized. IAS/Shunts: No atrial level shunt detected by color flow Doppler.  LEFT VENTRICLE PLAX 2D LVIDd:         3.10 cm   Diastology LVIDs:         2.20  cm   LV e' medial:    7.94 cm/s LV PW:         1.20 cm   LV E/e' medial:  8.4 LV IVS:        1.20 cm   LV e' lateral:   10.00 cm/s LVOT diam:     2.00 cm   LV E/e' lateral: 6.7 LV SV:         31 LV SV Index:   16 LVOT Area:     3.14 cm  RIGHT VENTRICLE RV Basal diam:  4.40 cm RV Mid diam:    4.30 cm RV S prime:     9.79 cm/s TAPSE (M-mode): 1.2 cm LEFT ATRIUM           Index       RIGHT ATRIUM           Index LA diam:      2.70 cm 1.34 cm/m  RA Area:     26.10 cm LA Vol (A4C): 19.6 ml 9.71 ml/m  RA Volume:   90.60 ml  44.90 ml/m  AORTIC VALVE AV Area (Vmax):    2.46 cm AV Area (Vmean):   2.22 cm AV Area (VTI):     3.34 cm AV Vmax:           74.60 cm/s AV Vmean:          54.300 cm/s AV VTI:            0.094 m AV Peak Grad:      2.2 mmHg AV Mean Grad:      1.0 mmHg LVOT Vmax:         58.30 cm/s LVOT Vmean:        38.400 cm/s LVOT VTI:          0.100 m LVOT/AV VTI ratio: 1.06  AORTA Ao Root diam: 2.60 cm MITRAL VALVE               TRICUSPID VALVE MV Area (PHT): 10.25 cm   TR Peak grad:   53.6 mmHg MV Area VTI:   2.34 cm    TR Vmax:        366.00 cm/s MV Peak grad:  2.7 mmHg MV Mean grad:  2.0 mmHg    SHUNTS MV Vmax:       0.81 m/s    Systemic VTI:  0.10 m MV Vmean:      58.9 cm/s   Systemic Diam: 2.00 cm MV Decel Time: 74  msec MV E velocity: 66.60 cm/s MV A velocity: 59.20 cm/s MV E/A ratio:  1.13 Carianna Lague McleanMD Electronically signed by Wilfred Lacy Signature Date/Time: 10/11/2022/1:31:26 PM    Final      Medications:     Scheduled Medications:  acetaminophen  1,000 mg Oral Q6H   budesonide (PULMICORT) nebulizer solution  0.25 mg Nebulization BID   Chlorhexidine Gluconate Cloth  6 each Topical Daily   famotidine  20 mg Oral BID   furosemide  80 mg Intravenous BID   ipratropium-albuterol  3 mL Nebulization TID   sildenafil  20 mg Oral TID   sodium chloride flush  3 mL Intravenous Q12H   spironolactone  25 mg Oral Daily   Treprostinil  18 mcg Inhalation Q4H while awake   valACYclovir  1,000 mg Oral BID    Infusions:  sodium chloride     [START ON 10/13/2022] sodium chloride     heparin 1,050 Units/hr (10/11/22 2331)    PRN Medications:  sodium chloride, calcium carbonate, docusate sodium, lidocaine, mouth rinse, polyethylene glycol, sodium chloride flush   Assessment/Plan   1. Acute on chronic hypoxemic respiratory failure: Patient is on home oxygen 8L [place] at baseline, now requiring HFNC.  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 PFTs have shown restriction with severely decreased DLCO and serial CTs have shown diffuse GGOs and bibasilar scattered consolidation.  Suspect some form of inflammatory pneumonitis, probably not hypersensitivity pneumonitis as there has not been much response to steroids. She was thought to be too high risk for lung biopsy. CTA chest this admission with no PE, diffuse GGO.  Suspect that there is a significant component of CHF/volume overload/HFpEF.  Procalcitonin not significantly elevated and respiratory viral panel negative.  - Steroids stopped due to lack of response in the past.  - No antibiotics at this time.  - Continue diuresis with Lasix 80 mg IV bid and spironolactone 25 mg daily.   2. 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  There is evidence for RV failure on exam. Suspect some of GGO on CTA chest is related to pulmonary edema.  Lactate still mildly elevated at 2.2 => 2.0, worrisome for poor cardiac output in setting of RV failure. She had IV Lasix yesterday with modest response, still volume overloaded on exam.  - Lasix 80 mg IV bid + spironolactone as above. - With GU infection (herpes flare), would hold off on SGLT2 inhibitor.  - Will arrange for RHC today to assess filling pressures, cardiac output, and PA pressure.  She may benefit from milrinone for RV support while we diurese (may need more aggressive diuresis).  Discussed risks/benefits with patient and she agrees to the procedure.  3. 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  I think this is predominantly groups 2 and 3 PH from HFpEF, parenchymal lung disease (ILD process) and OSA. She is already on 8L home oxygen and I worry that she is heading towards end stage RV failure. RHC in 2022 per report had elevated PCWP.  Most recent RHC in 6/23 showed mean RA 10, PA 90/40 mean 58, mean PCWP 10, CI 1.5. Low cardiac output is concerning. As above, she has signs of RV failure on exam and elevated lactate. She has been on sildenafil and Tyvaso at home, does not think they have helped much.  - RHC today to assess filling pressures, cardiac output, and PA pressure.  - She bring in her home Tyvaso.  Will also continue sildenafil for now.  4. Abdominal discomfort: AST/ALT normal, RUQ [place] with no evidence for cholecystectomy.  Elevated bilirubin may be suggestive of hepatic congestion from RV failure. Suspect her early  satiety is related to RV failure.  5. 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  She has been anticoagulated.  - Would hold apixaban for cath, cover with heparin gtt. Restart apixaban afterwards.     Length of Stay: 1  Marca Ancona, MD  10/12/2022, 8:19 AM  Advanced Heart Failure Team Pager [phone] (M-F; 7a - 5p)  Please contact [facility] for night-coverage after hours (5p -7a ) and weekends on [email or web address]   Plan based on RHC:  1.  Normal PCWP 2. Severe pulmonary arterial hypertension with PVR 12.5 WU.   3. Severely elevated RA pressure.  Predominantly RV failure.  4. PAPi low but not markedly low at 1.45.  5. Low cardiac output.    I will start her on milrinone 0.125 and digoxin 0.125 daily.  Will initiate Lasix gtt at 12 mg/hr (had bolus this morning) and give dose of metolazone. Will place PICC to follow CVP and co-ox. RHC is concerning for development of severe RV failure.  PAH is thought to be predominantly group 3 from underlying lung disease, workup has been at [facility].  She has Tyvaso and sildenafil.  With normal PCWP, will increase sildenafil.  Long-term, suspect she would not be a good candidate for IV pulmonary vasodilators as thought to be predominantly group 3.   CRITICAL CARE Performed by: Marca Ancona  Total critical care time: 35 minutes  Critical care time was exclusive of separately billable procedures and treating other patients.  Critical care was necessary to treat or prevent imminent or life-threatening deterioration.  Critical care was time spent personally by me on the following activities: development of treatment plan with patient and/or surrogate as well as nursing, discussions with consultants, evaluation of patient's response to treatment, examination of patient, obtaining history from patient or surrogate, ordering and performing treatments and interventions, ordering and review of  laboratory studies, ordering and review of radiographic studies, pulse oximetry and re-evaluation of patient's condition.  Marca Ancona 10/12/2022 10:26 AM

## 2022-10-12 NOTE — Progress Notes (Signed)
Peripherally Inserted Central Catheter Placement  The IV Nurse has discussed with the patient and/or persons authorized to consent for the patient, the purpose of this procedure and the potential benefits and risks involved with this procedure.  The benefits include less needle sticks, lab draws from the catheter, and the patient may be discharged home with the catheter. Risks include, but not limited to, infection, bleeding, blood clot (thrombus formation), and puncture of an artery; nerve damage and irregular heartbeat and possibility to perform a PICC exchange if needed/ordered by physician.  Alternatives to this procedure were also discussed.  Bard Power PICC patient education guide, fact sheet on infection prevention and patient information card has been provided to patient /or left at bedside.    PICC Placement Documentation  PICC Double Lumen 10/12/22 Right Brachial 42 cm 0 cm (Active)  Indication for Insertion or Continuance of Line Vasoactive infusions 10/12/22 1237  Exposed Catheter (cm) 0 cm 10/12/22 1237  Site Assessment Clean, Dry, Intact 10/12/22 1237  Lumen #1 Status Flushed;Saline locked;Blood return noted 10/12/22 1237  Lumen #2 Status Flushed;Saline locked;Blood return noted 10/12/22 1237  Dressing Type Transparent;Securing device 10/12/22 1237  Dressing Status Antimicrobial disc in place;Clean, Dry, Intact 10/12/22 1237  Safety Lock Not Applicable 10/12/22 1237  Line Care Connections checked and tightened 10/12/22 1237  Line Adjustment (NICU/IV Team Only) No 10/12/22 1237  Dressing Intervention New dressing 10/12/22 1237  Dressing Change Due 10/19/22 10/12/22 1237       Elliot Dally 10/12/2022, 12:39 PM

## 2022-10-13 ENCOUNTER — Encounter: Payer: Self-pay | Admitting: Cardiology

## 2022-10-13 LAB — BASIC METABOLIC PANEL
Anion gap: 11 (ref 5–15)
BUN: 22 mg/dL — ABNORMAL HIGH (ref 6–20)
CO2: 30 mmol/L (ref 22–32)
Calcium: 9.6 mg/dL (ref 8.9–10.3)
Chloride: 93 mmol/L — ABNORMAL LOW (ref 98–111)
Creatinine, Ser: 1.01 mg/dL — ABNORMAL HIGH (ref 0.44–1.00)
GFR, Estimated: >60 mL/min (ref >60)
Glucose, Bld: 103 mg/dL — ABNORMAL HIGH (ref 70–99)
Potassium: 3.7 mmol/L (ref 3.5–5.1)
Sodium: 134 mmol/L — ABNORMAL LOW (ref 135–145)

## 2022-10-13 LAB — PHOSPHORUS: Phosphorus: 4.4 mg/dL (ref 2.5–4.6)

## 2022-10-13 LAB — CBC
HCT: 44.4 % (ref 36.0–46.0)
Hemoglobin: 15.6 g/dL — ABNORMAL HIGH (ref 12.0–15.0)
MCH: 27 pg (ref 26.0–34.0)
MCHC: 35.1 g/dL (ref 30.0–36.0)
MCV: 76.9 fL — ABNORMAL LOW (ref 80.0–100.0)
Platelets: 331 10*3/uL (ref 150–400)
RBC: 5.77 MIL/uL — ABNORMAL HIGH (ref 3.87–5.11)
RDW: 20.1 % — ABNORMAL HIGH (ref 11.5–15.5)
WBC: 21.1 10*3/uL — ABNORMAL HIGH (ref 4.0–10.5)
nRBC: 0.5 % — ABNORMAL HIGH (ref 0.0–0.2)

## 2022-10-13 LAB — LACTIC ACID, PLASMA: Lactic Acid, Venous: 1.9 mmol/L (ref 0.5–1.9)

## 2022-10-13 LAB — COOXEMETRY PANEL
Carboxyhemoglobin: 1.5 % (ref 0.5–1.5)
Methemoglobin: <0.7 % (ref 0.0–1.5)
O2 Saturation: 72.4 %
Total hemoglobin: 11.9 g/dL — ABNORMAL LOW (ref 12.0–16.0)
Total oxygen content: 71 %

## 2022-10-13 LAB — TROPONIN I (HIGH SENSITIVITY): Troponin I (High Sensitivity): 34 ng/L — ABNORMAL HIGH (ref <18)

## 2022-10-13 LAB — MAGNESIUM: Magnesium: 2.7 mg/dL — ABNORMAL HIGH (ref 1.7–2.4)

## 2022-10-13 MED ORDER — POTASSIUM CHLORIDE CRYS ER 20 MEQ PO TBCR
40.0000 meq | EXTENDED_RELEASE_TABLET | Freq: Two times a day (BID) | ORAL | Status: AC
  Administered 2022-10-13 (×2): 40 meq via ORAL
  Filled 2022-10-13 (×2): qty 2

## 2022-10-13 MED ORDER — MORPHINE SULFATE (PF) 2 MG/ML IV SOLN
1.0000 mg | Freq: Once | INTRAVENOUS | Status: AC
  Administered 2022-10-14: 1 mg via INTRAVENOUS
  Filled 2022-10-13: qty 1

## 2022-10-13 MED ORDER — METOLAZONE 2.5 MG PO TABS
2.5000 mg | ORAL_TABLET | Freq: Once | ORAL | Status: AC
  Administered 2022-10-13: 2.5 mg via ORAL
  Filled 2022-10-13: qty 1

## 2022-10-13 NOTE — Progress Notes (Signed)
Called bedside as patient is complaining of 10/10 chest pain. Upon bedside assessment, patient describes pain as starting as a "cramp" in Right flank and radiating to medial chest area. Right flank has stopped hurting at this time but medial chest area still painful. Patient reports having these "side cramps" frequently at home. Pain reproducible with light palpation. Vitals stable. - STAT EKG & troponin  I, Cheryll Cockayne Rust-Chester, AGACNP-BC, personally viewed and interpreted this ECG. EKG Interpretation: Date: 10/12/22, EKG Time: 23:08, Rate: 113, Rhythm: NSR, QRS Axis: RAD Intervals: RVH, probable RAE, borderline prolonged Qtc, ST/T Wave abnormalities: none, Narrative Interpretation: NSR with RVH  Troponin trending down at 34 Suspect musculoskeletal pain. - morphine 2 mg IV ordered    Betsey Holiday, AGACNP-BC Acute Care Nurse Practitioner  Pulmonary & Critical Care   [phone] / [phone] Please see Amion for details.

## 2022-10-13 NOTE — Plan of Care (Signed)
Continuing with plan of care. 

## 2022-10-13 NOTE — Consult Note (Signed)
PHARMACY CONSULT NOTE - FOLLOW UP  Pharmacy Consult for Electrolyte Monitoring and Replacement   Recent Labs: Potassium (mmol/L)  Date Value  10/13/2022 3.7  06/06/2013 3.2 (L)   Magnesium (mg/dL)  Date Value  65/78/4696 2.7 (H)   Calcium (mg/dL)  Date Value  29/52/8413 9.6   Calcium, Total (mg/dL)  Date Value  24/40/1027 9.4   Albumin (g/dL)  Date Value  25/36/6440 4.0  06/06/2013 3.9   Phosphorus (mg/dL)  Date Value  34/74/2595 4.4   Sodium (mmol/L)  Date Value  10/13/2022 134 (L)  06/06/2013 139     Assessment: 27 yo F with significant PMHx of PHTN, HFpEF, ILD, COPD & PE on Eliquis presenting to [facility] ED on 10/10/22 after being sent over from urgent care due to hypoxia. Admit to SDU due to acute on chronic hypoxic respiratory failure multifocal in the setting of mild HFpEF exacerbation, ILD, PHTN & COPD as well as HSV outbreak. Pharmacy has been consulted to monitor and replace electrolytes while under PCCM care.  Medications: Lasix gtt, spironolactone 25mg  daily   Diet/Nutrition: sodium(<2g) and fluid(<1520mL) restriction  Goal of Therapy:  K>4, Mg>2 All other electrolytes WNL  Plan:  KCL x 2 Will recheck electrolytes tomorrow with AM labs  Bettey Costa ,PharmD Clinical Pharmacist 10/13/2022 7:46 AM

## 2022-10-13 NOTE — Progress Notes (Signed)
PROGRESS NOTE    Yesenia Carrillo  UEA:[id number] DOB: 1995-10-21 DOA: 10/10/2022  PCP: [facility]   Brief Narrative:  This 27 years old female with PMH significant for pulmonary hypertension, HFp EF, interstitial lung disease, COPD, PE on Eliquis presented in the ED after being sent over from urgent care for hypoxia.  Patient reports not feeling well with generalized fatigue for last 1 week.  She went to urgent care for genital herpes outbreak and was found to be hypoxic with SpO2 of 80% on her baseline oxygen 8 L/min. She reports to the ED physician that her feet appears more swollen and has not been checking her weight. She reports being compliant with all her medication including Eliquis.  She reports at home she has not been monitoring her SpO2 but titrated her oxygen up to 8 L due to shortness of breath, Her most recent pulmonology appointment showed her at 6 L oxygen as an outpatient.  She has sleep apnea and now been using BiPAP at night.  Upon arrival in the ED,  She was hypoxic with SpO2 68% on 8 L of nasal cannula, she was placed on nonrebreather and subsequently required high flow nasal cannula at 75% 40 L.  She was found to have elevated troponin suggestive of demand ischemia, lactic acidosis, hypokalemia.  CTA chest ruled out pulmonary embolism.  Patient was admitted in the ICU.  Patient is evaluated by heart failure team, She is now requiring Lasix and milrinone infusion. TRH pickup 5/10.   Assessment & Plan:   Principal Problem:   Acute on chronic respiratory failure (HCC) Active Problems:   Pulmonary hypertension, unspecified (HCC)   Herpes simplex vulvovaginitis   Acute on chronic heart failure with preserved ejection fraction (HCC)  Acute on chronic hypoxic respiratory failure: Likely acute diastolic CHF, volume overload: Interstitial lung disease: Pulmonary artery hypertension: At baseline she uses 8 L via nasal cannula.  Now requiring high flow  nasal cannula.   She does have interstitial lung disease of unclear etiology. She might have some form of inflammatory pneumonitis.   CTA chest ruled out pulmonary embolism.  Procalcitonin not significantly elevated and respiratory viral panel negative. No need for antibiotics at this time.  Continue diuresis as per Cardiology. Steroid discontinued as per cardiology. Heart failure team recommendation appreciated Echocardiogram this admission showed LVEF 55 to 60%, there is an evidence of right ventricular failure on exam. RHC showed severe PAH with severely elevated RA pressure and moderately decreased PAP. Continue Lasix infusion.  She was given a dose of metolazone x 1. Continue milrinone infusion Continue digoxin and spironolactone. She is started on sildenafil 40 mg 3 times daily, continue Tyvaso.   Abdominal pain: Abdominal ultrasound is normal.  History of pulmonary embolism: Initiated on IV heparin, now changed to Eliquis.  Morbid Obesity: Diet and Exercise discussed in detail. Estimated body mass index is 45.4 kg/m as calculated from the following:   Height as of this encounter: 5\' 1"  (1.549 m).   Weight as of this encounter: 109 kg.     DVT prophylaxis: Eliquis Code Status:Full code. Family Communication: No family at bedside. Disposition Plan:    Status is: Inpatient Remains inpatient appropriate because: Admitted for acute on chronic hypoxic respiratory failure requiring high flow nasal cannula.  She has acute on chronic diastolic heart failure requiring Lasix and milrinone infusion.  Heart failure team following.    Consultants:  Cardiology  Procedures: Right heart cath Antimicrobials:  Anti-infectives (From admission,  onward)    Start     Dose/Rate Route Frequency Ordered Stop   10/11/22 1000  valACYclovir (VALTREX) tablet 1,000 mg        1,000 mg Oral 2 times daily 10/11/22 0335 10/16/22 0959   10/11/22 0030  vancomycin (VANCOREADY) IVPB 2000 mg/400 mL        See Hyperspace for full Linked Orders Report.   2,000 mg 200 mL/hr over 120 Minutes Intravenous  Once 10/11/22 0017 10/11/22 0333   10/11/22 0030  vancomycin (VANCOREADY) IVPB 500 mg/100 mL       See Hyperspace for full Linked Orders Report.   500 mg 100 mL/hr over 60 Minutes Intravenous  Once 10/11/22 0017 10/12/22 0702   10/11/22 0000  vancomycin (VANCOREADY) IVPB 2000 mg/400 mL  Status:  Discontinued        2,000 mg 200 mL/hr over 120 Minutes Intravenous  Once 10/10/22 2357 10/11/22 0016   10/11/22 0000  piperacillin-tazobactam (ZOSYN) IVPB 3.375 g        3.375 g 100 mL/hr over 30 Minutes Intravenous  Once 10/10/22 2357 10/11/22 0130      Subjective: Patient was seen and examined at bedside.  Overnight events noted. Patient remains on high flow nasal cannula 10 L. Patient reports doing better denies any chest pain.  Objective: Vitals:   10/13/22 1200 10/13/22 1300 10/13/22 1346 10/13/22 1400  BP: 101/61     Pulse:   (!) 109   Resp: (!) 26 15 18  (!) 23  Temp: 98.1 F (36.7 C)     TempSrc: Oral     SpO2: 95% 95% 94% 94%  Weight:      Height:        Intake/Output Summary (Last 24 hours) at 10/13/2022 1438 Last data filed at 10/13/2022 1400 Gross per 24 hour  Intake 232.89 ml  Output 6625 ml  Net -6392.11 ml   Filed Weights   10/11/22 0404 10/12/22 0702 10/13/22 0500  Weight: 106 kg 108.7 kg 109 kg    Examination:  General exam: Appears calm and comfortable, Morbidly Obese,  deconditioned.   Respiratory system: Clear to auscultation. Respiratory effort normal. RR 17 Cardiovascular system: S1 & S2 heard, regular rate and rhythm, no murmur. Gastrointestinal system: Abdomen is soft, non tender, non distended, BS+ Central nervous system: Alert and oriented x 3. No focal neurological deficits. Extremities: Edema+, no cyanosis, no clubbing Skin: No rashes, lesions or ulcers Psychiatry: Judgement and insight appear normal. Mood & affect appropriate.     Data  Reviewed: I have personally reviewed following labs and imaging studies  CBC: Recent Labs  Lab 10/10/22 1947 10/11/22 0517 10/12/22 0603 10/12/22 1006 10/12/22 1009 10/12/22 1011 10/13/22 0617  WBC 15.6* 17.4* 15.4*  --   --   --  21.1*  NEUTROABS 9.8*  --  13.1*  --   --   --   --   HGB 15.7* 15.4* 15.5* 17.3* 17.0* 17.0* 15.6*  HCT 45.3 46.1* 43.5 51.0* 50.0* 50.0* 44.4  MCV 78.4* 79.9* 76.9*  --   --   --  76.9*  PLT 334 290 308  --   --   --  331   Basic Metabolic Panel: Recent Labs  Lab 10/11/22 0517 10/11/22 1732 10/12/22 0603 10/12/22 1006 10/12/22 1009 10/12/22 1011 10/12/22 1713 10/13/22 0617  NA 136 135 134* 138 139 138 135 134*  K 3.6 3.7 4.3 4.0 3.9 4.1 4.3 3.7  CL 101 101 100  --   --   --  96* 93*  CO2 23 24 23   --   --   --  27 30  GLUCOSE 91 153* 144*  --   --   --  123* 103*  BUN 12 13 20   --   --   --  20 22*  CREATININE 1.18* 1.22* 1.28*  --   --   --  1.09* 1.01*  CALCIUM 9.2 9.5 9.6  --   --   --  9.8 9.6  MG 2.6* 2.2 2.5*  --   --   --  2.6* 2.7*  PHOS 5.8* 4.4 4.1  --   --   --  4.1 4.4   GFR: Estimated Creatinine Clearance: 97.2 mL/min (A) (by C-G formula based on SCr of 1.01 mg/dL (H)). Liver Function Tests: Recent Labs  Lab 10/10/22 2047 10/12/22 0603  AST 29 34  ALT 18 18  ALKPHOS 109 121  BILITOT 5.8* 4.2*  PROT 7.3 7.9  ALBUMIN 3.6 4.0   Recent Labs  Lab 10/10/22 2047  LIPASE 25   No results for input(s): "AMMONIA" in the last 168 hours. Coagulation Profile: Recent Labs  Lab 10/11/22 1726 10/12/22 0603  INR 2.0* 1.7*   Cardiac Enzymes: No results for input(s): "CKTOTAL", "CKMB", "CKMBINDEX", "TROPONINI" in the last 168 hours. BNP (last 3 results) No results for input(s): "PROBNP" in the last 8760 hours. HbA1C: No results for input(s): "HGBA1C" in the last 72 hours. CBG: Recent Labs  Lab 10/11/22 0236  GLUCAP 122*   Lipid Profile: No results for input(s): "CHOL", "HDL", "LDLCALC", "TRIG", "CHOLHDL",  "LDLDIRECT" in the last 72 hours. Thyroid Function Tests: No results for input(s): "TSH", "T4TOTAL", "FREET4", "T3FREE", "THYROIDAB" in the last 72 hours. Anemia Panel: No results for input(s): "VITAMINB12", "FOLATE", "FERRITIN", "TIBC", "IRON", "RETICCTPCT" in the last 72 hours. Sepsis Labs: Recent Labs  Lab 10/10/22 2047 10/11/22 0025 10/11/22 0420 10/11/22 0517 10/11/22 1318 10/11/22 1732 10/12/22 0603 10/13/22 0617  PROCALCITON <0.10  --  0.13  --   --   --   --   --   LATICACIDVEN  --    < >  --    < > 1.6 2.3* 2.0* 1.9   < > = values in this interval not displayed.    Recent Results (from the past 240 hour(s))  SARS Coronavirus 2 by RT PCR (hospital order, performed in [facility] hospital lab) *cepheid single result test*     Status: None   Collection Time: 10/10/22  7:26 PM  Result Value Ref Range Status   SARS Coronavirus 2 by RT PCR NEGATIVE NEGATIVE Final    Comment: (NOTE) SARS-CoV-2 target nucleic acids are NOT DETECTED.  The SARS-CoV-2 RNA is generally detectable in upper and lower respiratory specimens during the acute phase of infection. The lowest concentration of SARS-CoV-2 viral copies this assay can detect is 250 copies / mL. A negative result does not preclude SARS-CoV-2 infection and should not be used as the sole basis for treatment or other patient management decisions.  A negative result may occur with improper specimen collection / handling, submission of specimen other than nasopharyngeal swab, presence of viral mutation(s) within the areas targeted by this assay, and inadequate number of viral copies (<250 copies / mL). A negative result must be combined with clinical observations, patient history, and epidemiological information.  Fact Sheet for Patients:   RoadLapTop.co.za  Fact Sheet for Healthcare Providers: http://kim-miller.com/  This test is not yet approved or  cleared by the [place]  FDA and has been authorized for detection and/or diagnosis of SARS-CoV-2 by FDA under an Emergency Use Authorization (EUA).  This EUA will remain in effect (meaning this test can be used) for the duration of the COVID-19 declaration under Section 564(b)(1) of the Act, 21 U.S.C. section 360bbb-3(b)(1), unless the authorization is terminated or revoked sooner.  Performed at [facility], [address] [place], [place] [address]   Blood culture (routine x 2)     Status: None (Preliminary result)   Collection Time: 10/11/22 12:25 AM   Specimen: BLOOD  Result Value Ref Range Status   Specimen Description BLOOD LEFT [facility]  Final   Special Requests   Final    BOTTLES DRAWN AEROBIC AND ANAEROBIC Blood Culture adequate volume   Culture   Final    NO GROWTH 2 DAYS Performed at [facility], [address], [place], [place] [address]    Report Status PENDING  Incomplete  MRSA Next Gen by PCR, Nasal     Status: None   Collection Time: 10/11/22  2:52 AM  Result Value Ref Range Status   MRSA by PCR Next Gen NOT DETECTED NOT DETECTED Final    Comment: (NOTE) The GeneXpert MRSA Assay (FDA approved for NASAL specimens only), is one component of a comprehensive MRSA colonization surveillance program. It is not intended to diagnose MRSA infection nor to guide or monitor treatment for MRSA infections. Test performance is not FDA approved in patients less than 8 years old. Performed at [facility], [address] [place], [place] [address]   Respiratory (~20 pathogens) panel by PCR     Status: None   Collection Time: 10/11/22  2:54 AM   Specimen: Nasopharyngeal Swab; Respiratory  Result Value Ref Range Status   Adenovirus NOT DETECTED NOT DETECTED Final   Coronavirus 229E NOT DETECTED NOT DETECTED Final    Comment: (NOTE) The Coronavirus on the Respiratory Panel, DOES NOT test for the novel  Coronavirus (2019 nCoV)    Coronavirus HKU1 NOT DETECTED NOT DETECTED  Final   Coronavirus NL63 NOT DETECTED NOT DETECTED Final   Coronavirus OC43 NOT DETECTED NOT DETECTED Final   Metapneumovirus NOT DETECTED NOT DETECTED Final   Rhinovirus / Enterovirus NOT DETECTED NOT DETECTED Final   Influenza A NOT DETECTED NOT DETECTED Final   Influenza B NOT DETECTED NOT DETECTED Final   Parainfluenza Virus 1 NOT DETECTED NOT DETECTED Final   Parainfluenza Virus 2 NOT DETECTED NOT DETECTED Final   Parainfluenza Virus 3 NOT DETECTED NOT DETECTED Final   Parainfluenza Virus 4 NOT DETECTED NOT DETECTED Final   Respiratory Syncytial Virus NOT DETECTED NOT DETECTED Final   Bordetella pertussis NOT DETECTED NOT DETECTED Final   Bordetella Parapertussis NOT DETECTED NOT DETECTED Final   Chlamydophila pneumoniae NOT DETECTED NOT DETECTED Final   Mycoplasma pneumoniae NOT DETECTED NOT DETECTED Final    Comment: Performed at [facility], [address], [place], [place] [address]  Wet prep, genital     Status: Abnormal   Collection Time: 10/11/22  3:33 AM  Result Value Ref Range Status   Yeast Wet Prep HPF POC NONE SEEN NONE SEEN Final   Trich, Wet Prep NONE SEEN NONE SEEN Final   Clue Cells Wet Prep HPF POC NONE SEEN NONE SEEN Final   WBC, Wet Prep HPF POC NONE SEEN (A) <10 Final   Sperm NONE SEEN  Final    Comment: Performed at [facility], [address]  [place], [place] [address]  Chlamydia/NGC rt PCR ([facility] only)     Status: None   Collection Time: 10/11/22  4:36 AM  Result Value Ref Range Status   Specimen source GC/Chlam ENDOCERVICAL  Final   Chlamydia Tr NOT DETECTED NOT DETECTED Final   N gonorrhoeae NOT DETECTED NOT DETECTED Final    Comment: (NOTE) This CT/NG assay has not been evaluated in patients with a history of  hysterectomy. Performed at [facility], [address] [place], [place] [address]   Aerobic/Anaerobic Culture w Gram Stain (surgical/deep wound)     Status: Abnormal (Preliminary result)   Collection Time:  10/11/22  8:38 AM   Specimen: Vaginal  Result Value Ref Range Status   Specimen Description   Final    VAGINA Performed at [facility], [address] [place], [place] [address]    Special Requests   Final    NONE Performed at [facility], [address] [place], [place] [address]    Gram Stain   Final    NO WBC SEEN ABUNDANT GRAM NEGATIVE RODS FEW GRAM POSITIVE COCCI IN PAIRS Performed at [facility], [address] [place], [place] [address]    Culture MULTIPLE ORGANISMS PRESENT, NONE PREDOMINANT (A)  Final   Report Status PENDING  Incomplete     Radiology Studies: CARDIAC CATHETERIZATION  Result Date: 10/12/2022 1. Normal PCWP 2. Severe pulmonary arterial hypertension with PVR 12.5 WU.  3. Severely elevated RA pressure.  Predominantly RV failure. 4. PAPi low but not markedly low at 1.45. 5. Low cardiac output. I will start her on milrinone 0.125 and digoxin 0.125 daily.  Will initiate Lasix gtt at 12 mg/hr (had bolus this morning) and give dose of metolazone. Will place PICC to follow CVP and co-ox. RHC is concerning for development of severe RV failure.  PAH is thought to be predominantly group 3 from underlying lung disease, workup has been at [facility].  She has Tyvaso and sildenafil.  With normal PCWP, will increase sildenafil.  Long-term, suspect she would not be a good candidate for IV pulmonary vasodilators as thought to be predominantly group 3.   [place] EKG SITE RITE  Result Date: 10/12/2022 If Site Rite image not attached, placement could not be confirmed due to current cardiac rhythm.   Scheduled Meds:  acetaminophen  1,000 mg Oral Q6H   apixaban  5 mg Oral BID   budesonide (PULMICORT) nebulizer solution  0.25 mg Nebulization BID   Chlorhexidine Gluconate Cloth  6 each Topical Daily   digoxin  0.125 mg Oral Daily   famotidine  20 mg Oral BID   ipratropium-albuterol  3 mL Nebulization TID   potassium chloride  40 mEq Oral BID   sildenafil  40 mg  Oral TID   sodium chloride flush  10-40 mL Intracatheter Q12H   sodium chloride flush  3 mL Intravenous Q12H   sodium chloride flush  3 mL Intravenous Q12H   spironolactone  25 mg Oral Daily   Treprostinil  18 mcg Inhalation Q4H while awake   valACYclovir  1,000 mg Oral BID   Continuous Infusions:  sodium chloride     furosemide (LASIX) 200 mg in dextrose 5 % 100 mL (2 mg/mL) infusion 12 mg/hr (10/13/22 1400)   milrinone 0.125 mcg/kg/min (10/13/22 1400)     LOS: 2 days    Time spent: 50 mins    Willeen Niece, MD [facility]   If 7PM-7AM, please contact night-coverage

## 2022-10-13 NOTE — Progress Notes (Signed)
Patient complaining of muscle tightness in abdomen, saying it feels like "muscle spasms". While getting her up to the [facility]. Marisa Cyphers, NP notified.

## 2022-10-13 NOTE — Progress Notes (Signed)
Patient ID: Yesenia Carrillo, female   DOB: June 20, 1995, 26 y.o.   MRN: [id number]     Advanced Heart Failure Rounding Note  PCP-Cardiologist: None   Subjective:    Diuresis with Lasix gtt 12 mg/hr + milrinone 0.125 + metolazone 2.5 x 1 yesterday, I/Os net negative 5156. Lactate 2.3 => 2.0 => 1.9.  Breathing better.  Co-ox 72% with CVP 15-16 today.   RHC Procedural Findings: Hemodynamics (mmHg) RA mean 26 RV 64/29 PA 77/39, mean 55 PCWP mean 9 Oxygen saturations: SVC 65% PA 62% AO 97% Cardiac Output (Fick) 3.67  Cardiac Index (Fick) 1.8 PVR 12.5 WU PAPi 1.46    Objective:   Weight Range: 109 kg Body mass index is 45.4 kg/m.   Vital Signs:   Temp:  [97.5 F (36.4 C)-98 F (36.7 C)] 98 F (36.7 C) (05/09 1505) Pulse Rate:  [103-126] 111 (05/10 0600) Resp:  [18-35] 26 (05/10 0700) BP: (89-145)/(25-117) 95/77 (05/10 0600) SpO2:  [15 %-99 %] 95 % (05/10 0600) FiO2 (%):  [97 %] 97 % (05/09 0954) Weight:  [109 kg] 109 kg (05/10 0500) Last BM Date :  (PTa)  Weight change: Filed Weights   10/11/22 0404 10/12/22 0702 10/13/22 0500  Weight: 106 kg 108.7 kg 109 kg    Intake/Output:   Intake/Output Summary (Last 24 hours) at 10/13/2022 0745 Last data filed at 10/13/2022 0700 Gross per 24 hour  Intake 293.95 ml  Output 6250 ml  Net -5956.05 ml      Physical Exam    General: NAD Neck: JVP 16 cm, no thyromegaly or thyroid nodule.  Lungs: Clear to auscultation bilaterally with normal respiratory effort. CV: Nondisplaced PMI.  Heart regular S1/S2, no S3/S4, no murmur.  Trace ankle edema.    Abdomen: Soft, nontender, no hepatosplenomegaly, no distention.  Skin: Intact without lesions or rashes.  Neurologic: Alert and oriented x 3.  Psych: Normal affect. Extremities: No clubbing or cyanosis.  HEENT: Normal.    Telemetry   NSR 100s (personally reviewed)  Labs    CBC Recent Labs    10/10/22 1947 10/11/22 0517 10/12/22 0603 10/12/22 1006 10/12/22 1011  10/13/22 0617  WBC 15.6*   < > 15.4*  --   --  21.1*  NEUTROABS 9.8*  --  13.1*  --   --   --   HGB 15.7*   < > 15.5*   < > 17.0* 15.6*  HCT 45.3   < > 43.5   < > 50.0* 44.4  MCV 78.4*   < > 76.9*  --   --  76.9*  PLT 334   < > 308  --   --  331   < > = values in this interval not displayed.   Basic Metabolic Panel Recent Labs    47/82/95 1713 10/13/22 0617  NA 135 134*  K 4.3 3.7  CL 96* 93*  CO2 27 30  GLUCOSE 123* 103*  BUN 20 22*  CREATININE 1.09* 1.01*  CALCIUM 9.8 9.6  MG 2.6* 2.7*  PHOS 4.1 4.4   Liver Function Tests Recent Labs    10/10/22 2047 10/12/22 0603  AST 29 34  ALT 18 18  ALKPHOS 109 121  BILITOT 5.8* 4.2*  PROT 7.3 7.9  ALBUMIN 3.6 4.0   Recent Labs    10/10/22 2047  LIPASE 25   Cardiac Enzymes No results for input(s): "CKTOTAL", "CKMB", "CKMBINDEX", "TROPONINI" in the last 72 hours.  BNP: BNP (last 3 results) Recent Labs  08/09/22 0501 09/26/22 2131 10/10/22 1947  BNP 251.2* 462.9* 399.9*    ProBNP (last 3 results) No results for input(s): "PROBNP" in the last 8760 hours.   D-Dimer No results for input(s): "DDIMER" in the last 72 hours. Hemoglobin A1C No results for input(s): "HGBA1C" in the last 72 hours. Fasting Lipid Panel No results for input(s): "CHOL", "HDL", "LDLCALC", "TRIG", "CHOLHDL", "LDLDIRECT" in the last 72 hours. Thyroid Function Tests No results for input(s): "TSH", "T4TOTAL", "T3FREE", "THYROIDAB" in the last 72 hours.  Invalid input(s): "FREET3"  Other results:   Imaging    CARDIAC CATHETERIZATION  Result Date: 10/12/2022 1. Normal PCWP 2. Severe pulmonary arterial hypertension with PVR 12.5 WU.  3. Severely elevated RA pressure.  Predominantly RV failure. 4. PAPi low but not markedly low at 1.45. 5. Low cardiac output. I will start her on milrinone 0.125 and digoxin 0.125 daily.  Will initiate Lasix gtt at 12 mg/hr (had bolus this morning) and give dose of metolazone. Will place PICC to follow CVP  and co-ox. RHC is concerning for development of severe RV failure.  PAH is thought to be predominantly group 3 from underlying lung disease, workup has been at [facility].  She has Tyvaso and sildenafil.  With normal PCWP, will increase sildenafil.  Long-term, suspect she would not be a good candidate for IV pulmonary vasodilators as thought to be predominantly group 3.   [place] EKG SITE RITE  Result Date: 10/12/2022 If Site Rite image not attached, placement could not be confirmed due to current cardiac rhythm.    Medications:     Scheduled Medications:  acetaminophen  1,000 mg Oral Q6H   apixaban  5 mg Oral BID   budesonide (PULMICORT) nebulizer solution  0.25 mg Nebulization BID   Chlorhexidine Gluconate Cloth  6 each Topical Daily   digoxin  0.125 mg Oral Daily   famotidine  20 mg Oral BID   ipratropium-albuterol  3 mL Nebulization TID   sildenafil  40 mg Oral TID   sodium chloride flush  10-40 mL Intracatheter Q12H   sodium chloride flush  3 mL Intravenous Q12H   sodium chloride flush  3 mL Intravenous Q12H   spironolactone  25 mg Oral Daily   Treprostinil  18 mcg Inhalation Q4H while awake   valACYclovir  1,000 mg Oral BID    Infusions:  sodium chloride     furosemide (LASIX) 200 mg in dextrose 5 % 100 mL (2 mg/mL) infusion 12 mg/hr (10/13/22 0509)   milrinone 0.125 mcg/kg/min (10/13/22 0352)    PRN Medications: sodium chloride, acetaminophen, calcium carbonate, docusate sodium, hydrocortisone cream, lidocaine, ondansetron (ZOFRAN) IV, mouth rinse, polyethylene glycol, sodium chloride flush, sodium chloride flush   Assessment/Plan   1. Acute on chronic hypoxemic respiratory failure: Patient is on home oxygen 8L [place] at baseline, now requiring HFNC.  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 PFTs have shown restriction with severely decreased DLCO and serial CTs have shown diffuse GGOs and bibasilar scattered consolidation.   Suspect some form of inflammatory pneumonitis, probably not hypersensitivity pneumonitis as there has not been much response to steroids. She was thought to be too high risk for lung biopsy. CTA chest this admission with no PE, diffuse GGO.  Suspect that there is a significant component of CHF/volume overload/HFpEF.  Procalcitonin not significantly elevated and respiratory viral panel negative.  - Steroids stopped due to lack of response in the past.  - No antibiotics  at this time.  - Continue diuresis as below.  2. 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  There is evidence for RV failure on exam. Suspect some of GGO on CTA chest is related to pulmonary edema.  Lactate trending down 2.2 => 2.0 => 1.9, worrisome for poor cardiac output in setting of RV failure. RHC showed severe PAH with severely elevated RA pressure and moderately decreased PAPi at 1.45, CI low at 1.8. RV failure in setting of severe pulmonary hypertension.  Co-ox 72% this morning with CVP still 15-16.  She is diuresing well with current regimen of Lasix gtt + milrinone 0.125.  - Continue Lasix 12 mg/hr today, will redose with metolazone 2.5 x 1 and replace K.  - Continue milrinone 0.125 while diuresing.  - Continue digoxin 0.125 - Continue spironolactone 25 daily - With GU infection (herpes flare), would hold off on SGLT2 inhibitor.  3. Pulmonary hypertension: This has been thought to be predominantly groups 2 and 3 PH from HFpEF, parenchymal lung disease (ILD process) and OSA. However, she has been on Tyvaso and sildenafil through her pulmonologist at [facility].  She is already on 8L home oxygen and I worry that she is heading towards end stage RV failure. RHC  in 2022 per report had elevated PCWP.  RHC in 6/23 showed mean RA 10, PA 90/40 mean 58, mean PCWP 10, CI 1.5. Repeat RHC this admission with normal PCWP, severe PAH with high RA pressure, moderately low PAPi, and CI 1.8.  Low cardiac output is concerning. Main issue here appears to be severe PAH with RV failure.   Question at this point is whether there is a significant group 1 component that would benefit from IV pulmonary vasodilators (more aggressive PH treatment).  Discussed with pulmonary (Dr. Aundria Rud), pulmonary parenchymal disease does not appear severe enough to be driver for severe group 3 PH, so may be a chance she would benefit from more aggressive pulmonary vasodilator treatment.  - Sildenafil increased to 40 mg tid.  - Continue Tyvaso.  - Milrinone will help bring down PA pressure temporarily.  After she is fully diuresed, will need to wean off and get close outpatient followup with her [facility] specialist at [facility] regarding IV therapy.   4. Abdominal discomfort: AST/ALT normal, RUQ [place] with no evidence for cholecystectomy.  Elevated bilirubin may be suggestive of hepatic congestion from RV failure. Suspect her early satiety is related to RV failure.  5. 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  She has been anticoagulated.  - Continue apixaban.   CRITICAL CARE Performed by: Marca Ancona  Total critical care time: 35 minutes  Critical care time was exclusive of separately billable procedures and treating other patients.  Critical care was necessary to treat or prevent imminent or life-threatening deterioration.  Critical care was time spent personally by me on the following activities: development of treatment plan with patient and/or surrogate as well as nursing, discussions with consultants, evaluation of patient's response to treatment, examination of patient, obtaining history from patient or surrogate, ordering and  performing treatments and interventions, ordering and review of laboratory studies, ordering and review of radiographic studies, pulse oximetry and re-evaluation of patient's condition.     Length  of Stay: 2  Marca Ancona, MD  10/13/2022, 7:45 AM

## 2022-10-13 NOTE — Progress Notes (Addendum)
Patient complaining of 10/10 pain radiating to side, Yesenia Carrillo notified via chat,

## 2022-10-13 NOTE — Progress Notes (Signed)
Cheryll Cockayne at bedside, troponin ordered STAT.

## 2022-10-13 NOTE — Progress Notes (Signed)
Morphine ordered 1mg  by Marisa Cyphers, NP.

## 2022-10-14 DIAGNOSIS — I5033 Acute on chronic diastolic (congestive) heart failure: Secondary | ICD-10-CM

## 2022-10-14 DIAGNOSIS — J9622 Acute and chronic respiratory failure with hypercapnia: Secondary | ICD-10-CM

## 2022-10-14 LAB — BASIC METABOLIC PANEL
Anion gap: 12 (ref 5–15)
BUN: 28 mg/dL — ABNORMAL HIGH (ref 6–20)
CO2: 31 mmol/L (ref 22–32)
Calcium: 9.9 mg/dL (ref 8.9–10.3)
Chloride: 91 mmol/L — ABNORMAL LOW (ref 98–111)
Creatinine, Ser: 1.13 mg/dL — ABNORMAL HIGH (ref 0.44–1.00)
GFR, Estimated: >60 mL/min (ref >60)
Glucose, Bld: 102 mg/dL — ABNORMAL HIGH (ref 70–99)
Potassium: 4.2 mmol/L (ref 3.5–5.1)
Sodium: 134 mmol/L — ABNORMAL LOW (ref 135–145)

## 2022-10-14 LAB — COOXEMETRY PANEL
Carboxyhemoglobin: 1.3 % (ref 0.5–1.5)
Methemoglobin: <0.7 % (ref 0.0–1.5)
O2 Saturation: 65.3 %
Total hemoglobin: 16.9 g/dL — ABNORMAL HIGH (ref 12.0–16.0)
Total oxygen content: 64.1 %

## 2022-10-14 LAB — CULTURE, BLOOD (ROUTINE X 2): Culture: NO GROWTH

## 2022-10-14 MED ORDER — DICYCLOMINE HCL 10 MG PO CAPS
10.0000 mg | ORAL_CAPSULE | Freq: Once | ORAL | Status: AC
  Administered 2022-10-14: 10 mg via ORAL
  Filled 2022-10-14: qty 1

## 2022-10-14 MED ORDER — CYCLOBENZAPRINE HCL 5 MG PO TABS
5.0000 mg | ORAL_TABLET | Freq: Three times a day (TID) | ORAL | Status: DC | PRN
  Administered 2022-10-14 – 2022-10-16 (×2): 5 mg via ORAL
  Filled 2022-10-14 (×6): qty 1

## 2022-10-14 NOTE — Progress Notes (Signed)
Cardiology Progress Note   Patient Name: Yesenia Carrillo Date of Encounter: 10/14/2022  Primary Cardiologist: None  Subjective   Shortness of breath better, feels her breathing much improved compared to when she presented to the hospital Main complaint is leg cramping daytime and nighttime with any movement out of the bed Hoping to bring her 26-year-old daughter into the hospital for Mother's Day tomorrow Remains on Lasix infusion with milrinone CVP estimated 13-15, yesterday 15 Coox 65  Inpatient Medications    Scheduled Meds:  acetaminophen  1,000 mg Oral Q6H   apixaban  5 mg Oral BID   budesonide (PULMICORT) nebulizer solution  0.25 mg Nebulization BID   Chlorhexidine Gluconate Cloth  6 each Topical Daily   digoxin  0.125 mg Oral Daily   famotidine  20 mg Oral BID   ipratropium-albuterol  3 mL Nebulization TID   sildenafil  40 mg Oral TID   sodium chloride flush  10-40 mL Intracatheter Q12H   sodium chloride flush  3 mL Intravenous Q12H   sodium chloride flush  3 mL Intravenous Q12H   spironolactone  25 mg Oral Daily   Treprostinil  18 mcg Inhalation Q4H while awake   valACYclovir  1,000 mg Oral BID   Continuous Infusions:  sodium chloride     furosemide (LASIX) 200 mg in dextrose 5 % 100 mL (2 mg/mL) infusion 12 mg/hr (10/14/22 0923)   milrinone 0.125 mcg/kg/min (10/14/22 0454)   PRN Meds: sodium chloride, acetaminophen, calcium carbonate, docusate sodium, hydrocortisone cream, lidocaine, ondansetron (ZOFRAN) IV, mouth rinse, polyethylene glycol, sodium chloride flush, sodium chloride flush   Vital Signs    Vitals:   10/14/22 0800 10/14/22 0810 10/14/22 0900 10/14/22 0918  BP:    (!) 88/64  Pulse: (!) 110 (!) 111 (!) 117 (!) 116  Resp: (!) 28 (!) 27 (!) 23 (!) 23  Temp:    (!) 97.1 F (36.2 C)  TempSrc:    Oral  SpO2: 94% 94% (!) 89% 96%  Weight:      Height:        Intake/Output Summary (Last 24 hours) at 10/14/2022 1040 Last data filed at 10/14/2022  1011 Gross per 24 hour  Intake 160.02 ml  Output 4200 ml  Net -4039.98 ml   Filed Weights   10/12/22 0702 10/13/22 0500 10/14/22 0450  Weight: 108.7 kg 109 kg 104.6 kg    Physical Exam   GEN: Well nourished, well developed, in no acute distress.  obese HEENT: Grossly normal.  Neck: Supple, unable to estimate JVD, carotid bruits, or masses. Cardiac: RRR, no murmurs, rubs, or gallops. No clubbing, cyanosis, edema.  Radials 2+, DP/PT 2+ and equal bilaterally.  Respiratory:  Respirations regular and unlabored, clear to auscultation bilaterally. GI: Soft, nontender, nondistended, BS + x 4. MS: no deformity or atrophy. Skin: warm and dry, no rash. Neuro:  Strength and sensation are intact. Psych: AAOx3.  Normal affect.  Labs    Chemistry Recent Labs  Lab 10/10/22 2047 10/11/22 0517 10/12/22 0603 10/12/22 1006 10/12/22 1713 10/13/22 0617 10/14/22 0430  NA 137   < > 134*   < > 135 134* 134*  K 3.2*   < > 4.3   < > 4.3 3.7 4.2  CL 102   < > 100  --  96* 93* 91*  CO2 24   < > 23  --  27 30 31   GLUCOSE 80   < > 144*  --  123* 103* 102*  BUN  9   < > 20  --  20 22* 28*  CREATININE 0.92   < > 1.28*  --  1.09* 1.01* 1.13*  CALCIUM 8.7*   < > 9.6  --  9.8 9.6 9.9  PROT 7.3  --  7.9  --   --   --   --   ALBUMIN 3.6  --  4.0  --   --   --   --   AST 29  --  34  --   --   --   --   ALT 18  --  18  --   --   --   --   ALKPHOS 109  --  121  --   --   --   --   BILITOT 5.8*  --  4.2*  --   --   --   --   GFRNONAA >60   < > 60*  --  >60 >60 >60  ANIONGAP 11   < > 11  --  12 11 12    < > = values in this interval not displayed.     Hematology Recent Labs  Lab 10/11/22 0517 10/12/22 0603 10/12/22 1006 10/12/22 1009 10/12/22 1011 10/13/22 0617  WBC 17.4* 15.4*  --   --   --  21.1*  RBC 5.77* 5.66*  --   --   --  5.77*  HGB 15.4* 15.5*   < > 17.0* 17.0* 15.6*  HCT 46.1* 43.5   < > 50.0* 50.0* 44.4  MCV 79.9* 76.9*  --   --   --  76.9*  MCH 26.7 27.4  --   --   --  27.0  MCHC  33.4 35.6  --   --   --  35.1  RDW 20.7* 19.9*  --   --   --  20.1*  PLT 290 308  --   --   --  331   < > = values in this interval not displayed.    Cardiac Enzymes  Recent Labs  Lab 09/26/22 2131 09/27/22 0224 10/10/22 1947 10/10/22 2047 10/12/22 2317  TROPONINIHS 24* 23* 40* 37* 34*      BNP    Component Value Date/Time   BNP 399.9 (H) 10/10/2022 1947    ProBNP No results found for: "PROBNP"   DDimer No results for input(s): "DDIMER" in the last 168 hours.   Lipids  Lab Results  Component Value Date   CHOL 151 08/10/2022   HDL 29 (L) 08/10/2022   LDLCALC 109 (H) 08/10/2022   TRIG 65 08/10/2022   CHOLHDL 5.2 08/10/2022    HbA1c  Lab Results  Component Value Date   HGBA1C 6.1 (H) 08/09/2022    Radiology    US ABDOMEN LIMITED RUQ (LIVER/GB)  Result Date: 10/11/2022 CLINICAL DATA:  Right upper quadrant abdominal pain EXAM: ULTRASOUND ABDOMEN LIMITED RIGHT UPPER QUADRANT COMPARISON:  CT 10/10/2022 FINDINGS: Gallbladder: No gallstones or wall thickening visualized. No sonographic Murphy sign noted by sonographer. Common bile duct: Diameter: 3 mm in proximal diameter Liver: No focal lesion identified. Within normal limits in parenchymal echogenicity. Portal vein is patent on color Doppler imaging with normal direction of blood flow towards the liver. Other: None. IMPRESSION: 1. Normal right upper quadrant sonogram. No evidence of acute cholecystitis. Electronically Signed   By: Helyn Numbers M.D.   On: 10/11/2022 01:27   CT ABDOMEN PELVIS W CONTRAST  Result Date: 10/10/2022 CLINICAL DATA:  Acute abdominal pain.  EXAM: CT ABDOMEN AND PELVIS WITH CONTRAST TECHNIQUE: Multidetector CT imaging of the abdomen and pelvis was performed using the standard protocol following bolus administration of intravenous contrast. RADIATION DOSE REDUCTION: This exam was performed according to the departmental dose-optimization program which includes automated exposure control, adjustment of  the mA and/or kV according to patient size and/or use of iterative reconstruction technique. CONTRAST:  OMNIPAQUE IOHEXOL 350 MG/ML SOLN COMPARISON:  CT abdomen and pelvis 09/27/2022 FINDINGS: Lower chest: There are ground-glass opacities in the lung bases. Hepatobiliary: There is mild inflammation surrounding the gallbladder best seen on coronal imaging. There is no biliary ductal dilatation. There is fatty infiltration of the liver. Pancreas: Unremarkable. No pancreatic ductal dilatation or surrounding inflammatory changes. Spleen: Small in size, unchanged. Adrenals/Urinary Tract: Adrenal glands are unremarkable. Kidneys are normal, without renal calculi, focal lesion, or hydronephrosis. Bladder is unremarkable. Stomach/Bowel: There is a small rounded hyperdense area in the distal esophagus measuring 8 mm which may represent pill fragment. Stomach is within normal limits. Appendix appears normal. No evidence of bowel wall thickening, distention, or inflammatory changes. Vascular/Lymphatic: Nonenlarged gastrohepatic and portacaval lymph nodes are again seen. Aorta and IVC are normal in size. Reproductive: Uterus and bilateral adnexa are unremarkable. Other: Central mesenteric haziness is present, but has decreased. No ascites or focal abdominal wall hernia. There is scarring in the left inguinal region, unchanged. There is some skin thickening of the lower anterior abdominal wall. This is new from prior. Musculoskeletal: No acute or significant osseous findings. IMPRESSION: 1. Mild inflammation surrounding the gallbladder worrisome for acute cholecystitis. Recommend further evaluation with ultrasound. 2. Fatty infiltration of the liver. 3. Central mesenteric haziness has decreased. 4. New skin thickening of the lower anterior abdominal wall. Correlate clinically for cellulitis. 5. Ground-glass opacities in the lung bases, likely infectious/inflammatory. Electronically Signed   By: Darliss Cheney M.D.   On:  10/10/2022 23:37   CT Angio Chest PE W and/or Wo Contrast  Result Date: 10/10/2022 CLINICAL DATA:  High probability for PE EXAM: CT ANGIOGRAPHY CHEST WITH CONTRAST TECHNIQUE: Multidetector CT imaging of the chest was performed using the standard protocol during bolus administration of intravenous contrast. Multiplanar CT image reconstructions and MIPs were obtained to evaluate the vascular anatomy. RADIATION DOSE REDUCTION: This exam was performed according to the departmental dose-optimization program which includes automated exposure control, adjustment of the mA and/or kV according to patient size and/or use of iterative reconstruction technique. CONTRAST:  OMNIPAQUE IOHEXOL 350 MG/ML SOLN COMPARISON:  CT angiogram chest 09/27/2022 FINDINGS: Cardiovascular: Aorta is normal in size. Heart is enlarged, unchanged. There is a small to moderate-sized pericardial effusion similar to prior study. There is adequate opacification of the pulmonary arteries to the segmental level. No pulmonary embolism identified. Main pulmonary artery is enlarged compatible with pulmonary artery hypertension similar to the prior study. Mediastinum/Nodes: There are enlarged and nonenlarged prevascular lymph nodes measuring up to 18 mm short axis similar to the prior study. Visualized esophagus and thyroid gland are within normal limits. Lungs/Pleura: Patchy ground-glass opacities are seen throughout both lungs, mildly increased from prior. There is no lung consolidation, pleural effusion or pneumothorax. Upper Abdomen: No acute abnormality. Musculoskeletal: No chest wall abnormality. No acute or significant osseous findings. Review of the MIP images confirms the above findings. IMPRESSION: 1. No evidence for pulmonary embolism. 2. Stable cardiomegaly with small to moderate-sized pericardial effusion. 3. Stable pulmonary artery hypertension. 4. Patchy ground-glass opacities throughout both lungs, mildly increased from prior.  Findings are favored as  infectious/inflammatory. 5. Stable mediastinal lymphadenopathy. Electronically Signed   By: Darliss Cheney M.D.   On: 10/10/2022 23:31   DG Chest Portable 1 View  Result Date: 10/10/2022 CLINICAL DATA:  Hypoxia EXAM: PORTABLE CHEST 1 VIEW COMPARISON:  Chest x-ray 09/26/2022 FINDINGS: Cardiac silhouette is enlarged, unchanged. The lungs are clear. There is no pleural effusion or pneumothorax. No fractures are seen. IMPRESSION: Cardiomegaly. No acute pulmonary process. Electronically Signed   By: Darliss Cheney M.D.   On: 10/10/2022 20:11    Telemetry    Sinus tachycardia, low 100's - Personally Reviewed  Cardiac Studies    2D Echocardiogram 5.8.2024   1. Left ventricular ejection fraction, by estimation, is 55 to 60%. The  left ventricle has normal function. The left ventricle has no regional  wall motion abnormalities. There is mild concentric left ventricular  hypertrophy. Left ventricular diastolic  parameters are indeterminate.   2. D-shaped septum suggestive of RV pressure/volume overload. IVC was not  visualized. Peak RV-RA gradient 54 mmHg. Right ventricular systolic  function is severely reduced. The right ventricular size is moderately  enlarged.   3. Right atrial size was moderately dilated.   4. The mitral valve is normal in structure. No evidence of mitral valve  regurgitation. No evidence of mitral stenosis.   5. The aortic valve is tricuspid. Aortic valve regurgitation is not  visualized. No aortic stenosis is present.   6. A small pericardial effusion is present. The pericardial effusion is  circumferential.  _____________   R Heart Cardiac Catheterization  5.9.2024  1. Normal PCWP  2. Severe pulmonary arterial hypertension with PVR 12.5 WU.   3. Severely elevated RA pressure.  Predominantly RV failure.  4. PAPi low but not markedly low at 1.45. 5. Low cardiac output. I will start her on milrinone 0.125 and digoxin 0.125 daily.  Will initiate  Lasix gtt at 12 mg/hr (had bolus this morning) and give dose of metolazone. Will place PICC to follow CVP and co-ox. RHC is concerning for development of severe RV failure.  PAH is thought to be predominantly group 3 from underlying lung disease, workup has been at [facility].  She has Tyvaso and sildenafil.  With normal PCWP, will increase sildenafil.  Long-term, suspect she would not be a good candidate for IV pulmonary vasodilators as thought to be predominantly group 3.   Patient Profile     27 y.o. with history of chronic interstitial lung disease/inflammatory pneumonitis, pulmonary hypertension, HFpEF, PE, splenic vein thrombosis, OSA, sickle cell trait, chronic thrombocytosis/leukocytosis was admitted with acute on chronic hypoxemic respiratory failure.   Assessment & Plan    1. Acute on chronic hypoxemic respiratory failure:  home oxygen 8L [place] at baseline, now requiring HFNC.   followed by [facility] pulmonology, thought to have interstitial lung process of unclear etiology. PFTs have shown restriction with severely decreased DLCO and serial CTs have shown diffuse GGOs and bibasilar scattered consolidation.  Possible inflammatory pneumonitis, probably not hypersensitivity pneumonitis as there has not been much response to steroids. too high risk for lung biopsy.  Procalcitonin not significantly elevated and respiratory viral panel negative.  CTA chest this admission with no PE, diffuse GGO.  Evaluated by advanced heart failure, felt to have significant component of CHF/volume overload/HFpEF.   - Steroids stopped due to lack of response in the past.  - No antibiotics at this time.  -Plan for continued diuresis as below  2. HFpEF with prominent RV failure and pulmonary hypertension (mixed pulmonary arterial/pulmonary venous hypertension  in past): Echo this admission with EF 55-60%, mild LVH, D-shaped septum suggestive of RV pressure/volume overload with peak RV-RA gradient 54 mmHg, moderate RV enlargement  with severe decreased systolic function.    RV failure on exam.   RHC showed severe PAH with severely elevated RA pressure and moderately decreased PAPi at 1.45, CI low at 1.8. RV failure in setting of severe pulmonary hypertension.   Co-ox 65% this morning with CVP still 13-15.  She is diuresing well with current regimen of Lasix gtt + milrinone 0.125.  - Continue Lasix 12 mg/hr ,  milrinone 0.125 while diuresing.  - Continue digoxin 0.125, spironolactone 25 daily - With GU infection (herpes flare), would hold off on SGLT2 inhibitor.  -If urine outpt drops off, will redose with dose of metolazone 2.5  3. Pulmonary hypertension:  thought to be predominantly groups 2 and 3 PH from HFpEF, parenchymal lung disease (ILD process) and OSA.   has been on Tyvaso and sildenafil through her pulmonologist at [facility].  on 8L home oxygen  RHC in 2022 per report had elevated PCWP.   RHC in 6/23 showed mean RA 10, PA 90/40 mean 58, mean PCWP 10, CI 1.5. Repeat RHC this admission with normal PCWP, severe PAH with high RA pressure, moderately low PAPi, and CI 1.8.  Low cardiac output is concerning. Main issue here appears to be severe PAH with RV failure.    chance she would benefit from more aggressive pulmonary vasodilator treatment.  - Continue Sildenafil 40 mg tid , Tyvaso.  - Milrinone will help bring down PA pressure temporarily.  After she is fully diuresed, will need close outpatient followup with her [facility] specialist at [facility] regarding IV therapy.    4. Abdominal discomfort:  AST/ALT normal, RUQ [place] with no evidence for cholecystectomy.  Elevated bilirubin poss related from hepatic congestion from RV failure.   5. Venous thromboembolism:  History of provoked PE in 2019 in setting of ECMO, also had splenic vein thrombosis in 5/21.  She has been anticoagulated.  - Continue apixaban.     Total encounter time more than 50 minutes  Greater than 50% was spent in counseling and coordination of care with the  patient   Signed, Dossie Arbour, MD, Ph.D [facility]   For questions or updates, please contact   Please consult [email or web address] for contact info under Cardiology/STEMI.

## 2022-10-14 NOTE — Progress Notes (Signed)
Patient is removing blood pressure cuff and pulse ox, continuing to reinforce and encourage usage of both.

## 2022-10-14 NOTE — Progress Notes (Signed)
Patient continues to remove b/p cuff.

## 2022-10-14 NOTE — Hospital Course (Signed)
Minus 3.1 L yesterday, minus 11 L since admission. Wt down 4.4 kg since yesterday.

## 2022-10-14 NOTE — Progress Notes (Signed)
PROGRESS NOTE    Yesenia Carrillo  ZOX:[id number] DOB: [birth year] DOA: 10/10/2022  PCP: [facility]   Brief Narrative:  This 27 years old female with PMH significant for pulmonary hypertension, HFp EF, interstitial lung disease, COPD, PE on Eliquis presented in the ED after being sent over from urgent care for hypoxia.  Patient reports not feeling well with generalized fatigue for last 1 week.  She went to urgent care for genital herpes outbreak and was found to be hypoxic with SpO2 of 80% on her baseline oxygen 8 L/min. She reports to the ED physician that her feet appears more swollen and has not been checking her weight. She reports being compliant with all her medication including Eliquis.  She reports at home she has not been monitoring her SpO2 but titrated her oxygen up to 8 L due to shortness of breath, Her most recent pulmonology appointment showed her at 6 L oxygen as an outpatient.  She has sleep apnea and now been using BiPAP at night.  Upon arrival in the ED,  She was hypoxic with SpO2 68% on 8 L of nasal cannula, she was placed on nonrebreather and subsequently required high flow nasal cannula at 75% 40 L.  She was found to have elevated troponin suggestive of demand ischemia, lactic acidosis, hypokalemia.  CTA chest ruled out pulmonary embolism.  Patient was admitted in the ICU.  Patient is evaluated by heart failure team, She is now requiring Lasix and milrinone infusion. TRH pickup 5/10.   Assessment & Plan:   Principal Problem:   Acute on chronic respiratory failure (HCC) Active Problems:   Pulmonary hypertension, unspecified (HCC)   Herpes simplex vulvovaginitis   Acute on chronic heart failure with preserved ejection fraction (HCC)  Acute on chronic hypoxic respiratory failure: Likely acute diastolic CHF, volume overload: Interstitial lung disease: Pulmonary artery hypertension: At baseline she uses 8 L via nasal cannula.  Now requiring high flow  nasal cannula.   She does have interstitial lung disease of unclear etiology. She might have some form of inflammatory pneumonitis.  CTA chest ruled out pulmonary embolism.  Procalcitonin not significantly elevated and respiratory viral panel negative. No need for antibiotics at this time.  Continue diuresis as per Cardiology. Steroid discontinued as per cardiology. Heart failure team recommendation appreciated Echocardiogram this admission showed LVEF 55 to 60%, there is an evidence of right ventricular failure on exam. RHC showed severe PAH with severely elevated RA pressure and moderately decreased PAP. Continue Lasix infusion.  She was given a dose of metolazone x 1. Continue milrinone infusion Continue digoxin and spironolactone. She is started on sildenafil 40 mg 3 times daily, continue Tyvaso. She reports feeling slightly better,  she is back to her baseline O2 requirement 8 L of oxygen.  Abdominal pain: Abdominal ultrasound is normal.  History of pulmonary embolism: Initiated on IV heparin, now changed to Eliquis.  Morbid Obesity: Diet and Exercise discussed in detail. Estimated body mass index is 43.57 kg/m as calculated from the following:   Height as of this encounter: 5\' 1"  (1.549 m).   Weight as of this encounter: 104.6 kg.     DVT prophylaxis: Eliquis Code Status:Full code. Family Communication: No family at bedside. Disposition Plan:    Status is: Inpatient Remains inpatient appropriate because: Admitted for acute on chronic hypoxic respiratory failure requiring high flow nasal cannula.  She has acute on chronic diastolic heart failure requiring Lasix and milrinone infusion.  Heart failure team following.  Consultants:  Cardiology  Procedures: Right heart cath Antimicrobials:  Anti-infectives (From admission, onward)    Start     Dose/Rate Route Frequency Ordered Stop   10/11/22 1000  valACYclovir (VALTREX) tablet 1,000 mg        1,000 mg Oral 2 times  daily 10/11/22 0335 10/16/22 0959   10/11/22 0030  vancomycin (VANCOREADY) IVPB 2000 mg/400 mL       See Hyperspace for full Linked Orders Report.   2,000 mg 200 mL/hr over 120 Minutes Intravenous  Once 10/11/22 0017 10/11/22 0333   10/11/22 0030  vancomycin (VANCOREADY) IVPB 500 mg/100 mL       See Hyperspace for full Linked Orders Report.   500 mg 100 mL/hr over 60 Minutes Intravenous  Once 10/11/22 0017 10/12/22 0702   10/11/22 0000  vancomycin (VANCOREADY) IVPB 2000 mg/400 mL  Status:  Discontinued        2,000 mg 200 mL/hr over 120 Minutes Intravenous  Once 10/10/22 2357 10/11/22 0016   10/11/22 0000  piperacillin-tazobactam (ZOSYN) IVPB 3.375 g        3.375 g 100 mL/hr over 30 Minutes Intravenous  Once 10/10/22 2357 10/11/22 0130      Subjective: Patient was seen and examined at bedside.  Overnight events noted. Patient remains on high flow nasal cannula 8 L. Patient reports doing better, denies any chest pain or shortness of breath.  Objective: Vitals:   10/14/22 1200 10/14/22 1242 10/14/22 1317 10/14/22 1327  BP:  (!) 86/49 (!) 94/54   Pulse: (!) 118 (!) 117    Resp: (!) 22 19 (!) 26   Temp:   (!) 97.2 F (36.2 C)   TempSrc:   Oral   SpO2: 95% 95% 90% 95%  Weight:      Height:        Intake/Output Summary (Last 24 hours) at 10/14/2022 1354 Last data filed at 10/14/2022 1300 Gross per 24 hour  Intake 239.45 ml  Output 4100 ml  Net -3860.55 ml   Filed Weights   10/12/22 0702 10/13/22 0500 10/14/22 0450  Weight: 108.7 kg 109 kg 104.6 kg    Examination:  General exam: Appears comfortable, deconditioned, morbidly obese, not in any distress. Respiratory system: CTA bilaterally. Respiratory effort normal. RR 17 Cardiovascular system: S1 & S2 heard, regular rate and rhythm, no murmur. Gastrointestinal system: Abdomen is soft, non tender, non distended, BS+ Central nervous system: Alert and oriented x 3. No focal neurological deficits. Extremities: Edema+, no  cyanosis, no clubbing Skin: No rashes, lesions or ulcers Psychiatry: Judgement and insight appear normal. Mood & affect appropriate.     Data Reviewed: I have personally reviewed following labs and imaging studies  CBC: Recent Labs  Lab 10/10/22 1947 10/11/22 0517 10/12/22 0603 10/12/22 1006 10/12/22 1009 10/12/22 1011 10/13/22 0617  WBC 15.6* 17.4* 15.4*  --   --   --  21.1*  NEUTROABS 9.8*  --  13.1*  --   --   --   --   HGB 15.7* 15.4* 15.5* 17.3* 17.0* 17.0* 15.6*  HCT 45.3 46.1* 43.5 51.0* 50.0* 50.0* 44.4  MCV 78.4* 79.9* 76.9*  --   --   --  76.9*  PLT 334 290 308  --   --   --  331   Basic Metabolic Panel: Recent Labs  Lab 10/11/22 0517 10/11/22 1732 10/12/22 0603 10/12/22 1006 10/12/22 1009 10/12/22 1011 10/12/22 1713 10/13/22 0617 10/14/22 0430  NA 136 135 134*   < > 139  138 135 134* 134*  K 3.6 3.7 4.3   < > 3.9 4.1 4.3 3.7 4.2  CL 101 101 100  --   --   --  96* 93* 91*  CO2 23 24 23   --   --   --  27 30 31   GLUCOSE 91 153* 144*  --   --   --  123* 103* 102*  BUN 12 13 20   --   --   --  20 22* 28*  CREATININE 1.18* 1.22* 1.28*  --   --   --  1.09* 1.01* 1.13*  CALCIUM 9.2 9.5 9.6  --   --   --  9.8 9.6 9.9  MG 2.6* 2.2 2.5*  --   --   --  2.6* 2.7*  --   PHOS 5.8* 4.4 4.1  --   --   --  4.1 4.4  --    < > = values in this interval not displayed.   GFR: Estimated Creatinine Clearance: 84.7 mL/min (A) (by C-G formula based on SCr of 1.13 mg/dL (H)). Liver Function Tests: Recent Labs  Lab 10/10/22 2047 10/12/22 0603  AST 29 34  ALT 18 18  ALKPHOS 109 121  BILITOT 5.8* 4.2*  PROT 7.3 7.9  ALBUMIN 3.6 4.0   Recent Labs  Lab 10/10/22 2047  LIPASE 25   No results for input(s): "AMMONIA" in the last 168 hours. Coagulation Profile: Recent Labs  Lab 10/11/22 1726 10/12/22 0603  INR 2.0* 1.7*   Cardiac Enzymes: No results for input(s): "CKTOTAL", "CKMB", "CKMBINDEX", "TROPONINI" in the last 168 hours. BNP (last 3 results) No results for  input(s): "PROBNP" in the last 8760 hours. HbA1C: No results for input(s): "HGBA1C" in the last 72 hours. CBG: Recent Labs  Lab 10/11/22 0236  GLUCAP 122*   Lipid Profile: No results for input(s): "CHOL", "HDL", "LDLCALC", "TRIG", "CHOLHDL", "LDLDIRECT" in the last 72 hours. Thyroid Function Tests: No results for input(s): "TSH", "T4TOTAL", "FREET4", "T3FREE", "THYROIDAB" in the last 72 hours. Anemia Panel: No results for input(s): "VITAMINB12", "FOLATE", "FERRITIN", "TIBC", "IRON", "RETICCTPCT" in the last 72 hours. Sepsis Labs: Recent Labs  Lab 10/10/22 2047 10/11/22 0025 10/11/22 0420 10/11/22 0517 10/11/22 1318 10/11/22 1732 10/12/22 0603 10/13/22 0617  PROCALCITON <0.10  --  0.13  --   --   --   --   --   LATICACIDVEN  --    < >  --    < > 1.6 2.3* 2.0* 1.9   < > = values in this interval not displayed.    Recent Results (from the past 240 hour(s))  SARS Coronavirus 2 by RT PCR (hospital order, performed in [facility] hospital lab) *cepheid single result test*     Status: None   Collection Time: 10/10/22  7:26 PM  Result Value Ref Range Status   SARS Coronavirus 2 by RT PCR NEGATIVE NEGATIVE Final    Comment: (NOTE) SARS-CoV-2 target nucleic acids are NOT DETECTED.  The SARS-CoV-2 RNA is generally detectable in upper and lower respiratory specimens during the acute phase of infection. The lowest concentration of SARS-CoV-2 viral copies this assay can detect is 250 copies / mL. A negative result does not preclude SARS-CoV-2 infection and should not be used as the sole basis for treatment or other patient management decisions.  A negative result may occur with improper specimen collection / handling, submission of specimen other than nasopharyngeal swab, presence of viral mutation(s) within the areas targeted by  this assay, and inadequate number of viral copies (<250 copies / mL). A negative result must be combined with clinical observations, patient history, and  epidemiological information.  Fact Sheet for Patients:   RoadLapTop.co.za  Fact Sheet for Healthcare Providers: http://kim-miller.com/  This test is not yet approved or  cleared by the [place] FDA and has been authorized for detection and/or diagnosis of SARS-CoV-2 by FDA under an Emergency Use Authorization (EUA).  This EUA will remain in effect (meaning this test can be used) for the duration of the COVID-19 declaration under Section 564(b)(1) of the Act, 21 U.S.C. section 360bbb-3(b)(1), unless the authorization is terminated or revoked sooner.  Performed at [facility], [address] [place], [place] [address]   Blood culture (routine x 2)     Status: None (Preliminary result)   Collection Time: 10/11/22 12:25 AM   Specimen: BLOOD  Result Value Ref Range Status   Specimen Description BLOOD LEFT [facility]  Final   Special Requests   Final    BOTTLES DRAWN AEROBIC AND ANAEROBIC Blood Culture adequate volume   Culture   Final    NO GROWTH 3 DAYS Performed at [facility], [address], [place], [place] [address]    Report Status PENDING  Incomplete  MRSA Next Gen by PCR, Nasal     Status: None   Collection Time: 10/11/22  2:52 AM  Result Value Ref Range Status   MRSA by PCR Next Gen NOT DETECTED NOT DETECTED Final    Comment: (NOTE) The GeneXpert MRSA Assay (FDA approved for NASAL specimens only), is one component of a comprehensive MRSA colonization surveillance program. It is not intended to diagnose MRSA infection nor to guide or monitor treatment for MRSA infections. Test performance is not FDA approved in patients less than 79 years old. Performed at [facility], [address] [place], [place] [address]   Respiratory (~20 pathogens) panel by PCR     Status: None   Collection Time: 10/11/22  2:54 AM   Specimen: Nasopharyngeal Swab; Respiratory  Result Value Ref Range Status    Adenovirus NOT DETECTED NOT DETECTED Final   Coronavirus 229E NOT DETECTED NOT DETECTED Final    Comment: (NOTE) The Coronavirus on the Respiratory Panel, DOES NOT test for the novel  Coronavirus (2019 nCoV)    Coronavirus HKU1 NOT DETECTED NOT DETECTED Final   Coronavirus NL63 NOT DETECTED NOT DETECTED Final   Coronavirus OC43 NOT DETECTED NOT DETECTED Final   Metapneumovirus NOT DETECTED NOT DETECTED Final   Rhinovirus / Enterovirus NOT DETECTED NOT DETECTED Final   Influenza A NOT DETECTED NOT DETECTED Final   Influenza B NOT DETECTED NOT DETECTED Final   Parainfluenza Virus 1 NOT DETECTED NOT DETECTED Final   Parainfluenza Virus 2 NOT DETECTED NOT DETECTED Final   Parainfluenza Virus 3 NOT DETECTED NOT DETECTED Final   Parainfluenza Virus 4 NOT DETECTED NOT DETECTED Final   Respiratory Syncytial Virus NOT DETECTED NOT DETECTED Final   Bordetella pertussis NOT DETECTED NOT DETECTED Final   Bordetella Parapertussis NOT DETECTED NOT DETECTED Final   Chlamydophila pneumoniae NOT DETECTED NOT DETECTED Final   Mycoplasma pneumoniae NOT DETECTED NOT DETECTED Final    Comment: Performed at [facility], [address] [place], [place] [address]  Wet prep, genital     Status: Abnormal   Collection Time: 10/11/22  3:33 AM  Result Value Ref Range Status   Yeast Wet Prep HPF POC NONE SEEN NONE SEEN  Final   Trich, Wet Prep NONE SEEN NONE SEEN Final   Clue Cells Wet Prep HPF POC NONE SEEN NONE SEEN Final   WBC, Wet Prep HPF POC NONE SEEN (A) <10 Final   Sperm NONE SEEN  Final    Comment: Performed at [facility], [address] [place], [place] [address]  Chlamydia/NGC rt PCR [place] only)     Status: None   Collection Time: 10/11/22  4:36 AM  Result Value Ref Range Status   Specimen source GC/Chlam ENDOCERVICAL  Final   Chlamydia Tr NOT DETECTED NOT DETECTED Final   N gonorrhoeae NOT DETECTED NOT DETECTED Final    Comment: (NOTE) This CT/NG assay has not been evaluated  in patients with a history of  hysterectomy. Performed at [facility], [address], [place], [place] [address]   Aerobic/Anaerobic Culture w Gram Stain (surgical/deep wound)     Status: Abnormal (Preliminary result)   Collection Time: 10/11/22  8:38 AM   Specimen: Vaginal  Result Value Ref Range Status   Specimen Description   Final    VAGINA Performed at [facility], [address] [place], [place] [address]    Special Requests   Final    NONE Performed at [facility], [address] [place], [place] [address]    Gram Stain   Final    NO WBC SEEN ABUNDANT GRAM NEGATIVE RODS FEW GRAM POSITIVE COCCI IN PAIRS    Culture (A)  Final    MULTIPLE ORGANISMS PRESENT, NONE PREDOMINANT HOLDING FOR POSSIBLE ANAEROBE Performed at [facility], [address] [place], [place] [address]    Report Status PENDING  Incomplete     Radiology Studies: No results found.  Scheduled Meds:  acetaminophen  1,000 mg Oral Q6H   apixaban  5 mg Oral BID   budesonide (PULMICORT) nebulizer solution  0.25 mg Nebulization BID   Chlorhexidine Gluconate Cloth  6 each Topical Daily   digoxin  0.125 mg Oral Daily   famotidine  20 mg Oral BID   ipratropium-albuterol  3 mL Nebulization TID   sildenafil  40 mg Oral TID   sodium chloride flush  10-40 mL Intracatheter Q12H   sodium chloride flush  3 mL Intravenous Q12H   sodium chloride flush  3 mL Intravenous Q12H   spironolactone  25 mg Oral Daily   Treprostinil  18 mcg Inhalation Q4H while awake   valACYclovir  1,000 mg Oral BID   Continuous Infusions:  sodium chloride     furosemide (LASIX) 200 mg in dextrose 5 % 100 mL (2 mg/mL) infusion 12 mg/hr (10/14/22 1300)   milrinone 0.125 mcg/kg/min (10/14/22 1300)     LOS: 3 days    Time spent: 35 mins    Willeen Niece, MD [facility]   If 7PM-7AM, please contact night-coverage

## 2022-10-14 NOTE — Progress Notes (Signed)
Patient's B/P was 86/49 (61), patient is asymptomatic otherwise, Dr. Mariah Milling notified of reading and at this time will continue with current treatment and monitor patient.  Continuing to encourage patient to leave B/P cuff on for closer monitoring of B/P trends.

## 2022-10-14 NOTE — Consult Note (Signed)
PHARMACY CONSULT NOTE  Pharmacy Consult for Electrolyte Monitoring and Replacement   Recent Labs: Potassium (mmol/L)  Date Value  10/14/2022 4.2  06/06/2013 3.2 (L)   Magnesium (mg/dL)  Date Value  16/03/9603 2.7 (H)   Calcium (mg/dL)  Date Value  54/02/8118 9.9   Calcium, Total (mg/dL)  Date Value  14/78/2956 9.4   Albumin (g/dL)  Date Value  21/30/8657 4.0  06/06/2013 3.9   Phosphorus (mg/dL)  Date Value  84/69/6295 4.4   Sodium (mmol/L)  Date Value  10/14/2022 134 (L)  06/06/2013 139     Assessment: 27 yo F with significant PMHx of PHTN, HFpEF, ILD, COPD & PE on Eliquis presenting to [facility] ED on 10/10/22 after being sent over from urgent care due to hypoxia. Admit to SDU due to acute on chronic hypoxic respiratory failure multifocal in the setting of mild HFpEF exacerbation, ILD, PHTN & COPD as well as HSV outbreak. Pharmacy has been consulted to monitor and replace electrolytes while under PCCM care.  Pertinent medications: Lasix gtt, spironolactone 25mg  daily   Diet/Nutrition: sodium(<2g) and fluid(<1576mL) restriction  Goal of Therapy:  Potassium 4.0 - 5.1 mmol/L Magnesium 2.0 - 2.4 mg/dL All other electrolytes WNL  Plan:  ---no electrolyte replacement warranted for today ---Will recheck electrolytes tomorrow with AM labs  Lowella Bandy ,PharmD Clinical Pharmacist 10/14/2022 7:17 AM

## 2022-10-14 NOTE — Plan of Care (Signed)
Continuing with plan of care. 

## 2022-10-15 LAB — LIPOPROTEIN A (LPA): Lipoprotein (a): 12.5 nmol/L

## 2022-10-15 LAB — BASIC METABOLIC PANEL
Anion gap: 12 (ref 5–15)
BUN: 29 mg/dL — ABNORMAL HIGH (ref 6–20)
CO2: 31 mmol/L (ref 22–32)
Calcium: 9.7 mg/dL (ref 8.9–10.3)
Chloride: 88 mmol/L — ABNORMAL LOW (ref 98–111)
Creatinine, Ser: 1.1 mg/dL — ABNORMAL HIGH (ref 0.44–1.00)
GFR, Estimated: >60 mL/min (ref >60)
Glucose, Bld: 197 mg/dL — ABNORMAL HIGH (ref 70–99)
Potassium: 3.7 mmol/L (ref 3.5–5.1)
Sodium: 131 mmol/L — ABNORMAL LOW (ref 135–145)

## 2022-10-15 LAB — CBC
HCT: 50.6 % — ABNORMAL HIGH (ref 36.0–46.0)
Hemoglobin: 17.9 g/dL — ABNORMAL HIGH (ref 12.0–15.0)
MCH: 27.2 pg (ref 26.0–34.0)
MCHC: 35.4 g/dL (ref 30.0–36.0)
MCV: 76.9 fL — ABNORMAL LOW (ref 80.0–100.0)
Platelets: 349 10*3/uL (ref 150–400)
RBC: 6.58 MIL/uL — ABNORMAL HIGH (ref 3.87–5.11)
RDW: 20.3 % — ABNORMAL HIGH (ref 11.5–15.5)
WBC: 13.6 10*3/uL — ABNORMAL HIGH (ref 4.0–10.5)
nRBC: 0.6 % — ABNORMAL HIGH (ref 0.0–0.2)

## 2022-10-15 LAB — COOXEMETRY PANEL
Carboxyhemoglobin: 1.1 % (ref 0.5–1.5)
Methemoglobin: 0.7 % (ref 0.0–1.5)
O2 Saturation: 62.1 %
Total hemoglobin: 17.4 g/dL — ABNORMAL HIGH (ref 12.0–16.0)
Total oxygen content: 61 %

## 2022-10-15 LAB — MAGNESIUM: Magnesium: 2.7 mg/dL — ABNORMAL HIGH (ref 1.7–2.4)

## 2022-10-15 LAB — PHOSPHORUS: Phosphorus: 5.9 mg/dL — ABNORMAL HIGH (ref 2.5–4.6)

## 2022-10-15 LAB — CULTURE, BLOOD (ROUTINE X 2): Special Requests: ADEQUATE

## 2022-10-15 LAB — AEROBIC/ANAEROBIC CULTURE W GRAM STAIN (SURGICAL/DEEP WOUND)

## 2022-10-15 MED ORDER — MIDODRINE HCL 5 MG PO TABS
5.0000 mg | ORAL_TABLET | Freq: Three times a day (TID) | ORAL | Status: DC
  Administered 2022-10-15 – 2022-10-17 (×6): 5 mg via ORAL
  Filled 2022-10-15 (×7): qty 1

## 2022-10-15 MED ORDER — POTASSIUM CHLORIDE CRYS ER 20 MEQ PO TBCR
40.0000 meq | EXTENDED_RELEASE_TABLET | Freq: Once | ORAL | Status: AC
  Administered 2022-10-15: 40 meq via ORAL
  Filled 2022-10-15: qty 2

## 2022-10-15 MED ORDER — METOLAZONE 2.5 MG PO TABS
2.5000 mg | ORAL_TABLET | Freq: Once | ORAL | Status: AC
  Administered 2022-10-15: 2.5 mg via ORAL
  Filled 2022-10-15: qty 1

## 2022-10-15 NOTE — Progress Notes (Signed)
PROGRESS NOTE    Yesenia Carrillo  ZOX:[id number] DOB: 1996/01/08 DOA: 10/10/2022  PCP: [facility]   Brief Narrative:  This 27 years old female with PMH significant for pulmonary hypertension, HFp EF, interstitial lung disease, COPD, PE on Eliquis presented in the ED after being sent over from urgent care for hypoxia.  Patient reports not feeling well with generalized fatigue for last 1 week.  She went to urgent care for genital herpes outbreak and was found to be hypoxic with SpO2 of 80% on her baseline oxygen 8 L/min. She reports to the ED physician that her feet appears more swollen and has not been checking her weight. She reports being compliant with all her medication including Eliquis.  She reports at home she has not been monitoring her SpO2 but titrated her oxygen up to 8 L due to shortness of breath, Her most recent pulmonology appointment showed her at 6 L oxygen as an outpatient.  She has sleep apnea and now been using BiPAP at night.  Upon arrival in the ED,  She was hypoxic with SpO2 68% on 8 L of nasal cannula, she was placed on nonrebreather and subsequently required high flow nasal cannula at 75% 40 L.  She was found to have elevated troponin suggestive of demand ischemia, lactic acidosis, hypokalemia.  CTA chest ruled out pulmonary embolism.  Patient was admitted in the ICU.  Patient is evaluated by heart failure team, She is now requiring Lasix and milrinone infusion. TRH pickup 5/10.   Assessment & Plan:   Principal Problem:   Acute on chronic respiratory failure (HCC) Active Problems:   Pulmonary hypertension, unspecified (HCC)   Herpes simplex vulvovaginitis   Acute on chronic heart failure with preserved ejection fraction (HCC)  Acute on chronic hypoxic respiratory failure: Likely acute diastolic CHF, volume overload: Interstitial lung disease: Pulmonary artery hypertension: At baseline she uses 8 L via nasal cannula.  Now requiring high flow  nasal cannula.   She does have interstitial lung disease of unclear etiology. She might have some form of inflammatory pneumonitis.   CTA chest ruled out pulmonary embolism.  Procalcitonin not significantly elevated and respiratory viral panel negative.  No need for antibiotics at this time.  Continue diuresis as per Cardiology. Steroid discontinued as per cardiology. Heart failure team recommendation appreciated Echocardiogram this admission showed LVEF 55 to 60%, there is an evidence of right ventricular failure on exam. RHC showed severe PAH with severely elevated RA pressure and moderately decreased PAP. Continue Lasix infusion.  She was given a dose of metolazone x 1. Continue milrinone infusion Continue digoxin and spironolactone. She is started on sildenafil 40 mg 3 times daily, continue Tyvaso. She reports feeling slightly better,  she is back to her baseline O2 requirement 8 L of oxygen.  Abdominal pain: Abdominal ultrasound is normal.  History of pulmonary embolism: Initiated on IV heparin, now changed to Eliquis.  Morbid Obesity: Diet and Exercise discussed in detail. Estimated body mass index is 43.65 kg/m as calculated from the following:   Height as of this encounter: 5\' 1"  (1.549 m).   Weight as of this encounter: 104.8 kg.    DVT prophylaxis: Eliquis Code Status:Full code. Family Communication: No family at bedside. Disposition Plan:    Status is: Inpatient Remains inpatient appropriate because: Admitted for acute on chronic hypoxic respiratory failure requiring high flow nasal cannula.  She has acute on chronic diastolic heart failure requiring Lasix and milrinone infusion.  Heart failure team  following.    Consultants:  Cardiology  Procedures: Right heart cath Antimicrobials:  Anti-infectives (From admission, onward)    Start     Dose/Rate Route Frequency Ordered Stop   10/11/22 1000  valACYclovir (VALTREX) tablet 1,000 mg        1,000 mg Oral 2 times  daily 10/11/22 0335 10/16/22 0959   10/11/22 0030  vancomycin (VANCOREADY) IVPB 2000 mg/400 mL       See Hyperspace for full Linked Orders Report.   2,000 mg 200 mL/hr over 120 Minutes Intravenous  Once 10/11/22 0017 10/11/22 0333   10/11/22 0030  vancomycin (VANCOREADY) IVPB 500 mg/100 mL       See Hyperspace for full Linked Orders Report.   500 mg 100 mL/hr over 60 Minutes Intravenous  Once 10/11/22 0017 10/12/22 0702   10/11/22 0000  vancomycin (VANCOREADY) IVPB 2000 mg/400 mL  Status:  Discontinued        2,000 mg 200 mL/hr over 120 Minutes Intravenous  Once 10/10/22 2357 10/11/22 0016   10/11/22 0000  piperacillin-tazobactam (ZOSYN) IVPB 3.375 g        3.375 g 100 mL/hr over 30 Minutes Intravenous  Once 10/10/22 2357 10/11/22 0130      Subjective: Patient was seen and examined at bedside.  Overnight events noted. Patient remains on high flow oxygen 8 L. Patient reports she has taken shower by self.  Does not have shortness of breath.  Objective: Vitals:   10/15/22 0800 10/15/22 0900 10/15/22 1000 10/15/22 1100  BP: 95/60     Pulse: (!) 120     Resp: (!) 23 (!) 29    Temp: 98.1 F (36.7 C)     TempSrc: Oral     SpO2: 97% 97% 97% 97%  Weight:      Height:        Intake/Output Summary (Last 24 hours) at 10/15/2022 1240 Last data filed at 10/15/2022 1200 Gross per 24 hour  Intake 339.67 ml  Output 2050 ml  Net -1710.33 ml   Filed Weights   10/13/22 0500 10/14/22 0450 10/15/22 0238  Weight: 109 kg 104.6 kg 104.8 kg    Examination:  General exam: Appears comfortable, deconditioned, morbidly obese, not in any acute distress. Respiratory system: CTA bilaterally. Respiratory effort normal. RR 16 Cardiovascular system: S1 & S2 heard, regular rate and rhythm, no murmur. Gastrointestinal system: Abdomen is soft, non tender, non distended, BS+ Central nervous system: Alert and oriented x 3. No focal neurological deficits. Extremities: Edema+, no cyanosis, no  clubbing Skin: No rashes, lesions or ulcers Psychiatry: Judgement and insight appear normal. Mood & affect appropriate.     Data Reviewed: I have personally reviewed following labs and imaging studies  CBC: Recent Labs  Lab 10/10/22 1947 10/11/22 0517 10/12/22 0603 10/12/22 1006 10/12/22 1009 10/12/22 1011 10/13/22 0617 10/15/22 0530  WBC 15.6* 17.4* 15.4*  --   --   --  21.1* 13.6*  NEUTROABS 9.8*  --  13.1*  --   --   --   --   --   HGB 15.7* 15.4* 15.5* 17.3* 17.0* 17.0* 15.6* 17.9*  HCT 45.3 46.1* 43.5 51.0* 50.0* 50.0* 44.4 50.6*  MCV 78.4* 79.9* 76.9*  --   --   --  76.9* 76.9*  PLT 334 290 308  --   --   --  331 349   Basic Metabolic Panel: Recent Labs  Lab 10/11/22 1732 10/12/22 0603 10/12/22 1006 10/12/22 1011 10/12/22 1713 10/13/22 0617 10/14/22 0430 10/15/22  0530  NA 135 134*   < > 138 135 134* 134* 131*  K 3.7 4.3   < > 4.1 4.3 3.7 4.2 3.7  CL 101 100  --   --  96* 93* 91* 88*  CO2 24 23  --   --  27 30 31 31   GLUCOSE 153* 144*  --   --  123* 103* 102* 197*  BUN 13 20  --   --  20 22* 28* 29*  CREATININE 1.22* 1.28*  --   --  1.09* 1.01* 1.13* 1.10*  CALCIUM 9.5 9.6  --   --  9.8 9.6 9.9 9.7  MG 2.2 2.5*  --   --  2.6* 2.7*  --  2.7*  PHOS 4.4 4.1  --   --  4.1 4.4  --  5.9*   < > = values in this interval not displayed.   GFR: Estimated Creatinine Clearance: 87.1 mL/min (A) (by C-G formula based on SCr of 1.1 mg/dL (H)). Liver Function Tests: Recent Labs  Lab 10/10/22 2047 10/12/22 0603  AST 29 34  ALT 18 18  ALKPHOS 109 121  BILITOT 5.8* 4.2*  PROT 7.3 7.9  ALBUMIN 3.6 4.0   Recent Labs  Lab 10/10/22 2047  LIPASE 25   No results for input(s): "AMMONIA" in the last 168 hours. Coagulation Profile: Recent Labs  Lab 10/11/22 1726 10/12/22 0603  INR 2.0* 1.7*   Cardiac Enzymes: No results for input(s): "CKTOTAL", "CKMB", "CKMBINDEX", "TROPONINI" in the last 168 hours. BNP (last 3 results) No results for input(s): "PROBNP" in the  last 8760 hours. HbA1C: No results for input(s): "HGBA1C" in the last 72 hours. CBG: Recent Labs  Lab 10/11/22 0236  GLUCAP 122*   Lipid Profile: No results for input(s): "CHOL", "HDL", "LDLCALC", "TRIG", "CHOLHDL", "LDLDIRECT" in the last 72 hours. Thyroid Function Tests: No results for input(s): "TSH", "T4TOTAL", "FREET4", "T3FREE", "THYROIDAB" in the last 72 hours. Anemia Panel: No results for input(s): "VITAMINB12", "FOLATE", "FERRITIN", "TIBC", "IRON", "RETICCTPCT" in the last 72 hours. Sepsis Labs: Recent Labs  Lab 10/10/22 2047 10/11/22 0025 10/11/22 0420 10/11/22 0517 10/11/22 1318 10/11/22 1732 10/12/22 0603 10/13/22 0617  PROCALCITON <0.10  --  0.13  --   --   --   --   --   LATICACIDVEN  --    < >  --    < > 1.6 2.3* 2.0* 1.9   < > = values in this interval not displayed.    Recent Results (from the past 240 hour(s))  SARS Coronavirus 2 by RT PCR (hospital order, performed in [facility] hospital lab) *cepheid single result test*     Status: None   Collection Time: 10/10/22  7:26 PM  Result Value Ref Range Status   SARS Coronavirus 2 by RT PCR NEGATIVE NEGATIVE Final    Comment: (NOTE) SARS-CoV-2 target nucleic acids are NOT DETECTED.  The SARS-CoV-2 RNA is generally detectable in upper and lower respiratory specimens during the acute phase of infection. The lowest concentration of SARS-CoV-2 viral copies this assay can detect is 250 copies / mL. A negative result does not preclude SARS-CoV-2 infection and should not be used as the sole basis for treatment or other patient management decisions.  A negative result may occur with improper specimen collection / handling, submission of specimen other than nasopharyngeal swab, presence of viral mutation(s) within the areas targeted by this assay, and inadequate number of viral copies (<250 copies / mL). A negative result  must be combined with clinical observations, patient history, and epidemiological  information.  Fact Sheet for Patients:   RoadLapTop.co.za  Fact Sheet for Healthcare Providers: http://kim-miller.com/  This test is not yet approved or  cleared by the [place] FDA and has been authorized for detection and/or diagnosis of SARS-CoV-2 by FDA under an Emergency Use Authorization (EUA).  This EUA will remain in effect (meaning this test can be used) for the duration of the COVID-19 declaration under Section 564(b)(1) of the Act, 21 U.S.C. section 360bbb-3(b)(1), unless the authorization is terminated or revoked sooner.  Performed at [facility], [address] [place], [place] [address]   Blood culture (routine x 2)     Status: None (Preliminary result)   Collection Time: 10/11/22 12:25 AM   Specimen: BLOOD  Result Value Ref Range Status   Specimen Description BLOOD LEFT [facility]  Final   Special Requests   Final    BOTTLES DRAWN AEROBIC AND ANAEROBIC Blood Culture adequate volume   Culture   Final    NO GROWTH 4 DAYS Performed at [facility], [address] [place], [place] [address]    Report Status PENDING  Incomplete  MRSA Next Gen by PCR, Nasal     Status: None   Collection Time: 10/11/22  2:52 AM  Result Value Ref Range Status   MRSA by PCR Next Gen NOT DETECTED NOT DETECTED Final    Comment: (NOTE) The GeneXpert MRSA Assay (FDA approved for NASAL specimens only), is one component of a comprehensive MRSA colonization surveillance program. It is not intended to diagnose MRSA infection nor to guide or monitor treatment for MRSA infections. Test performance is not FDA approved in patients less than 4 years old. Performed at [facility], [address] [place], [place] [address]   Respiratory (~20 pathogens) panel by PCR     Status: None   Collection Time: 10/11/22  2:54 AM   Specimen: Nasopharyngeal Swab; Respiratory  Result Value Ref Range Status   Adenovirus NOT DETECTED  NOT DETECTED Final   Coronavirus 229E NOT DETECTED NOT DETECTED Final    Comment: (NOTE) The Coronavirus on the Respiratory Panel, DOES NOT test for the novel  Coronavirus (2019 nCoV)    Coronavirus HKU1 NOT DETECTED NOT DETECTED Final   Coronavirus NL63 NOT DETECTED NOT DETECTED Final   Coronavirus OC43 NOT DETECTED NOT DETECTED Final   Metapneumovirus NOT DETECTED NOT DETECTED Final   Rhinovirus / Enterovirus NOT DETECTED NOT DETECTED Final   Influenza A NOT DETECTED NOT DETECTED Final   Influenza B NOT DETECTED NOT DETECTED Final   Parainfluenza Virus 1 NOT DETECTED NOT DETECTED Final   Parainfluenza Virus 2 NOT DETECTED NOT DETECTED Final   Parainfluenza Virus 3 NOT DETECTED NOT DETECTED Final   Parainfluenza Virus 4 NOT DETECTED NOT DETECTED Final   Respiratory Syncytial Virus NOT DETECTED NOT DETECTED Final   Bordetella pertussis NOT DETECTED NOT DETECTED Final   Bordetella Parapertussis NOT DETECTED NOT DETECTED Final   Chlamydophila pneumoniae NOT DETECTED NOT DETECTED Final   Mycoplasma pneumoniae NOT DETECTED NOT DETECTED Final    Comment: Performed at [facility], [address], [place], [place] [address]  Wet prep, genital     Status: Abnormal   Collection Time: 10/11/22  3:33 AM  Result Value Ref Range Status   Yeast Wet Prep HPF POC NONE SEEN NONE SEEN Final   Trich, Wet Prep NONE SEEN NONE SEEN Final   Clue Cells  Wet Prep HPF POC NONE SEEN NONE SEEN Final   WBC, Wet Prep HPF POC NONE SEEN (A) <10 Final   Sperm NONE SEEN  Final    Comment: Performed at [facility], [address] [place], [place] [address]  Chlamydia/NGC rt PCR [facility] only)     Status: None   Collection Time: 10/11/22  4:36 AM  Result Value Ref Range Status   Specimen source GC/Chlam ENDOCERVICAL  Final   Chlamydia Tr NOT DETECTED NOT DETECTED Final   N gonorrhoeae NOT DETECTED NOT DETECTED Final    Comment: (NOTE) This CT/NG assay has not been evaluated in patients with a  history of  hysterectomy. Performed at [facility], [address] [place], [place] [address]   Aerobic/Anaerobic Culture w Gram Stain (surgical/deep wound)     Status: Abnormal (Preliminary result)   Collection Time: 10/11/22  8:38 AM   Specimen: Vaginal  Result Value Ref Range Status   Specimen Description   Final    VAGINA Performed at [facility], [address], [place], [place] [address]    Special Requests   Final    NONE Performed at [facility], [address] [place], [place] [address]    Gram Stain   Final    NO WBC SEEN ABUNDANT GRAM NEGATIVE RODS FEW GRAM POSITIVE COCCI IN PAIRS    Culture (A)  Final    MULTIPLE ORGANISMS PRESENT, NONE PREDOMINANT HOLDING FOR POSSIBLE ANAEROBE Performed at [facility], [address], [place], [place] [address]    Report Status PENDING  Incomplete     Radiology Studies: No results found.  Scheduled Meds:  acetaminophen  1,000 mg Oral Q6H   apixaban  5 mg Oral BID   budesonide (PULMICORT) nebulizer solution  0.25 mg Nebulization BID   Chlorhexidine Gluconate Cloth  6 each Topical Daily   digoxin  0.125 mg Oral Daily   famotidine  20 mg Oral BID   ipratropium-albuterol  3 mL Nebulization TID   metolazone  2.5 mg Oral Once   midodrine  5 mg Oral TID WC   potassium chloride  40 mEq Oral Once   sildenafil  40 mg Oral TID   sodium chloride flush  10-40 mL Intracatheter Q12H   sodium chloride flush  3 mL Intravenous Q12H   sodium chloride flush  3 mL Intravenous Q12H   spironolactone  25 mg Oral Daily   Treprostinil  18 mcg Inhalation Q4H while awake   valACYclovir  1,000 mg Oral BID   Continuous Infusions:  sodium chloride     furosemide (LASIX) 200 mg in dextrose 5 % 100 mL (2 mg/mL) infusion 12 mg/hr (10/15/22 1200)   milrinone 0.125 mcg/kg/min (10/15/22 1200)     LOS: 4 days    Time spent: 35 mins    Willeen Niece, MD [facility]   If 7PM-7AM, please  contact night-coverage

## 2022-10-15 NOTE — Progress Notes (Signed)
Patient encouraged to wear B/P cuff, cardiac monitoring, and pulse ox throughout the shift.  This nurse has multiple times placed these items back on patient, but patient removes soon after.  Dr. Idelle Leech aware of non-compliance.  Will continue to encourage and educate patient of importance of monitoring vital signs.

## 2022-10-15 NOTE — Progress Notes (Signed)
Cardiology Progress Note   Patient Name: Yesenia Carrillo Date of Encounter: 10/15/2022  Primary Cardiologist: None  Subjective   Resting comfortably in bed, sleeping Started on Flexeril yesterday for leg cramping Remains on Lasix infusion with milrinone CVP estimates are variable from 14 to 23 Coox 62  Inpatient Medications    Scheduled Meds:  acetaminophen  1,000 mg Oral Q6H   apixaban  5 mg Oral BID   budesonide (PULMICORT) nebulizer solution  0.25 mg Nebulization BID   Chlorhexidine Gluconate Cloth  6 each Topical Daily   digoxin  0.125 mg Oral Daily   famotidine  20 mg Oral BID   ipratropium-albuterol  3 mL Nebulization TID   midodrine  5 mg Oral TID WC   potassium chloride  40 mEq Oral Once   sildenafil  40 mg Oral TID   sodium chloride flush  10-40 mL Intracatheter Q12H   sodium chloride flush  3 mL Intravenous Q12H   sodium chloride flush  3 mL Intravenous Q12H   spironolactone  25 mg Oral Daily   Treprostinil  18 mcg Inhalation Q4H while awake   valACYclovir  1,000 mg Oral BID   Continuous Infusions:  sodium chloride     furosemide (LASIX) 200 mg in dextrose 5 % 100 mL (2 mg/mL) infusion 12 mg/hr (10/15/22 1200)   milrinone 0.125 mcg/kg/min (10/15/22 1200)   PRN Meds: sodium chloride, acetaminophen, calcium carbonate, cyclobenzaprine, docusate sodium, hydrocortisone cream, lidocaine, ondansetron (ZOFRAN) IV, mouth rinse, polyethylene glycol, sodium chloride flush, sodium chloride flush   Vital Signs    Vitals:   10/15/22 0900 10/15/22 1000 10/15/22 1100 10/15/22 1325  BP:      Pulse:      Resp: (!) 29     Temp:      TempSrc:      SpO2: 97% 97% 97% 96%  Weight:      Height:        Intake/Output Summary (Last 24 hours) at 10/15/2022 1453 Last data filed at 10/15/2022 1353 Gross per 24 hour  Intake 319.6 ml  Output 2800 ml  Net -2480.4 ml   Filed Weights   10/13/22 0500 10/14/22 0450 10/15/22 0238  Weight: 109 kg 104.6 kg 104.8 kg    Physical  Exam   Constitutional:  oriented to person, place, and time. No distress.  HENT:  Head: Grossly normal Eyes:  no discharge. No scleral icterus.  Neck:  JVD 10+, no carotid bruits  Cardiovascular: Regular rate and rhythm, no murmurs appreciated Trace ankle swelling Pulmonary/Chest: Clear to auscultation bilaterally, no wheezes or rails Abdominal: Soft.  no distension.  no tenderness.  Musculoskeletal: Normal range of motion Neurological:  normal muscle tone. Coordination normal. No atrophy Skin: Skin warm and dry Psychiatric: normal affect, pleasant   Labs    Chemistry Recent Labs  Lab 10/10/22 2047 10/11/22 0517 10/12/22 0603 10/12/22 1006 10/13/22 0617 10/14/22 0430 10/15/22 0530  NA 137   < > 134*   < > 134* 134* 131*  K 3.2*   < > 4.3   < > 3.7 4.2 3.7  CL 102   < > 100   < > 93* 91* 88*  CO2 24   < > 23   < > 30 31 31   GLUCOSE 80   < > 144*   < > 103* 102* 197*  BUN 9   < > 20   < > 22* 28* 29*  CREATININE 0.92   < > 1.28*   < >  1.01* 1.13* 1.10*  CALCIUM 8.7*   < > 9.6   < > 9.6 9.9 9.7  PROT 7.3  --  7.9  --   --   --   --   ALBUMIN 3.6  --  4.0  --   --   --   --   AST 29  --  34  --   --   --   --   ALT 18  --  18  --   --   --   --   ALKPHOS 109  --  121  --   --   --   --   BILITOT 5.8*  --  4.2*  --   --   --   --   GFRNONAA >60   < > 60*   < > >60 >60 >60  ANIONGAP 11   < > 11   < > 11 12 12    < > = values in this interval not displayed.     Hematology Recent Labs  Lab 10/12/22 0603 10/12/22 1006 10/12/22 1011 10/13/22 0617 10/15/22 0530  WBC 15.4*  --   --  21.1* 13.6*  RBC 5.66*  --   --  5.77* 6.58*  HGB 15.5*   < > 17.0* 15.6* 17.9*  HCT 43.5   < > 50.0* 44.4 50.6*  MCV 76.9*  --   --  76.9* 76.9*  MCH 27.4  --   --  27.0 27.2  MCHC 35.6  --   --  35.1 35.4  RDW 19.9*  --   --  20.1* 20.3*  PLT 308  --   --  331 349   < > = values in this interval not displayed.    Cardiac Enzymes  Recent Labs  Lab 09/26/22 2131 09/27/22 0224  10/10/22 1947 10/10/22 2047 10/12/22 2317  TROPONINIHS 24* 23* 40* 37* 34*      BNP    Component Value Date/Time   BNP 399.9 (H) 10/10/2022 1947    ProBNP No results found for: "PROBNP"   DDimer No results for input(s): "DDIMER" in the last 168 hours.   Lipids  Lab Results  Component Value Date   CHOL 151 08/10/2022   HDL 29 (L) 08/10/2022   LDLCALC 109 (H) 08/10/2022   TRIG 65 08/10/2022   CHOLHDL 5.2 08/10/2022    HbA1c  Lab Results  Component Value Date   HGBA1C 6.1 (H) 08/09/2022    Radiology    US ABDOMEN LIMITED RUQ (LIVER/GB)  Result Date: 10/11/2022 CLINICAL DATA:  Right upper quadrant abdominal pain EXAM: ULTRASOUND ABDOMEN LIMITED RIGHT UPPER QUADRANT COMPARISON:  CT 10/10/2022 FINDINGS: Gallbladder: No gallstones or wall thickening visualized. No sonographic Murphy sign noted by sonographer. Common bile duct: Diameter: 3 mm in proximal diameter Liver: No focal lesion identified. Within normal limits in parenchymal echogenicity. Portal vein is patent on color Doppler imaging with normal direction of blood flow towards the liver. Other: None. IMPRESSION: 1. Normal right upper quadrant sonogram. No evidence of acute cholecystitis. Electronically Signed   By: Helyn Numbers M.D.   On: 10/11/2022 01:27   CT ABDOMEN PELVIS W CONTRAST  Result Date: 10/10/2022 CLINICAL DATA:  Acute abdominal pain. EXAM: CT ABDOMEN AND PELVIS WITH CONTRAST TECHNIQUE: Multidetector CT imaging of the abdomen and pelvis was performed using the standard protocol following bolus administration of intravenous contrast. RADIATION DOSE REDUCTION: This exam was performed according to the departmental dose-optimization program which includes automated exposure  control, adjustment of the mA and/or kV according to patient size and/or use of iterative reconstruction technique. CONTRAST:  OMNIPAQUE IOHEXOL 350 MG/ML SOLN COMPARISON:  CT abdomen and pelvis 09/27/2022 FINDINGS: Lower chest: There are  ground-glass opacities in the lung bases. Hepatobiliary: There is mild inflammation surrounding the gallbladder best seen on coronal imaging. There is no biliary ductal dilatation. There is fatty infiltration of the liver. Pancreas: Unremarkable. No pancreatic ductal dilatation or surrounding inflammatory changes. Spleen: Small in size, unchanged. Adrenals/Urinary Tract: Adrenal glands are unremarkable. Kidneys are normal, without renal calculi, focal lesion, or hydronephrosis. Bladder is unremarkable. Stomach/Bowel: There is a small rounded hyperdense area in the distal esophagus measuring 8 mm which may represent pill fragment. Stomach is within normal limits. Appendix appears normal. No evidence of bowel wall thickening, distention, or inflammatory changes. Vascular/Lymphatic: Nonenlarged gastrohepatic and portacaval lymph nodes are again seen. Aorta and IVC are normal in size. Reproductive: Uterus and bilateral adnexa are unremarkable. Other: Central mesenteric haziness is present, but has decreased. No ascites or focal abdominal wall hernia. There is scarring in the left inguinal region, unchanged. There is some skin thickening of the lower anterior abdominal wall. This is new from prior. Musculoskeletal: No acute or significant osseous findings. IMPRESSION: 1. Mild inflammation surrounding the gallbladder worrisome for acute cholecystitis. Recommend further evaluation with ultrasound. 2. Fatty infiltration of the liver. 3. Central mesenteric haziness has decreased. 4. New skin thickening of the lower anterior abdominal wall. Correlate clinically for cellulitis. 5. Ground-glass opacities in the lung bases, likely infectious/inflammatory. Electronically Signed   By: Darliss Cheney M.D.   On: 10/10/2022 23:37   CT Angio Chest PE W and/or Wo Contrast  Result Date: 10/10/2022 CLINICAL DATA:  High probability for PE EXAM: CT ANGIOGRAPHY CHEST WITH CONTRAST TECHNIQUE: Multidetector CT imaging of the chest was  performed using the standard protocol during bolus administration of intravenous contrast. Multiplanar CT image reconstructions and MIPs were obtained to evaluate the vascular anatomy. RADIATION DOSE REDUCTION: This exam was performed according to the departmental dose-optimization program which includes automated exposure control, adjustment of the mA and/or kV according to patient size and/or use of iterative reconstruction technique. CONTRAST:  OMNIPAQUE IOHEXOL 350 MG/ML SOLN COMPARISON:  CT angiogram chest 09/27/2022 FINDINGS: Cardiovascular: Aorta is normal in size. Heart is enlarged, unchanged. There is a small to moderate-sized pericardial effusion similar to prior study. There is adequate opacification of the pulmonary arteries to the segmental level. No pulmonary embolism identified. Main pulmonary artery is enlarged compatible with pulmonary artery hypertension similar to the prior study. Mediastinum/Nodes: There are enlarged and nonenlarged prevascular lymph nodes measuring up to 18 mm short axis similar to the prior study. Visualized esophagus and thyroid gland are within normal limits. Lungs/Pleura: Patchy ground-glass opacities are seen throughout both lungs, mildly increased from prior. There is no lung consolidation, pleural effusion or pneumothorax. Upper Abdomen: No acute abnormality. Musculoskeletal: No chest wall abnormality. No acute or significant osseous findings. Review of the MIP images confirms the above findings. IMPRESSION: 1. No evidence for pulmonary embolism. 2. Stable cardiomegaly with small to moderate-sized pericardial effusion. 3. Stable pulmonary artery hypertension. 4. Patchy ground-glass opacities throughout both lungs, mildly increased from prior. Findings are favored as infectious/inflammatory. 5. Stable mediastinal lymphadenopathy. Electronically Signed   By: Darliss Cheney M.D.   On: 10/10/2022 23:31   DG Chest Portable 1 View  Result Date: 10/10/2022 CLINICAL DATA:   Hypoxia EXAM: PORTABLE CHEST 1 VIEW COMPARISON:  Chest x-ray 09/26/2022 FINDINGS:  Cardiac silhouette is enlarged, unchanged. The lungs are clear. There is no pleural effusion or pneumothorax. No fractures are seen. IMPRESSION: Cardiomegaly. No acute pulmonary process. Electronically Signed   By: Darliss Cheney M.D.   On: 10/10/2022 20:11    Telemetry    Sinus tachycardia, low 100's - Personally Reviewed  Cardiac Studies    2D Echocardiogram 5.8.2024   1. Left ventricular ejection fraction, by estimation, is 55 to 60%. The  left ventricle has normal function. The left ventricle has no regional  wall motion abnormalities. There is mild concentric left ventricular  hypertrophy. Left ventricular diastolic  parameters are indeterminate.   2. D-shaped septum suggestive of RV pressure/volume overload. IVC was not  visualized. Peak RV-RA gradient 54 mmHg. Right ventricular systolic  function is severely reduced. The right ventricular size is moderately  enlarged.   3. Right atrial size was moderately dilated.   4. The mitral valve is normal in structure. No evidence of mitral valve  regurgitation. No evidence of mitral stenosis.   5. The aortic valve is tricuspid. Aortic valve regurgitation is not  visualized. No aortic stenosis is present.   6. A small pericardial effusion is present. The pericardial effusion is  circumferential.  _____________   R Heart Cardiac Catheterization  5.9.2024  1. Normal PCWP  2. Severe pulmonary arterial hypertension with PVR 12.5 WU.   3. Severely elevated RA pressure.  Predominantly RV failure.  4. PAPi low but not markedly low at 1.45. 5. Low cardiac output. I will start her on milrinone 0.125 and digoxin 0.125 daily.  Will initiate Lasix gtt at 12 mg/hr (had bolus this morning) and give dose of metolazone. Will place PICC to follow CVP and co-ox. RHC is concerning for development of severe RV failure.  PAH is thought to be predominantly group 3 from  underlying lung disease, workup has been at [facility].  She has Tyvaso and sildenafil.  With normal PCWP, will increase sildenafil.  Long-term, suspect she would not be a good candidate for IV pulmonary vasodilators as thought to be predominantly group 3.   Patient Profile     27 y.o. with history of chronic interstitial lung disease/inflammatory pneumonitis, pulmonary hypertension, HFpEF, PE, splenic vein thrombosis, OSA, sickle cell trait, chronic thrombocytosis/leukocytosis was admitted with acute on chronic hypoxemic respiratory failure.   Assessment & Plan    1. Acute on chronic hypoxemic respiratory failure:  home oxygen 8L [place] at baseline, now requiring HFNC.   followed by [facility] pulmonology, thought to have interstitial lung process of unclear etiology. PFTs have shown restriction with severely decreased DLCO and serial CTs have shown diffuse GGOs and bibasilar scattered consolidation.  Possible inflammatory pneumonitis, probably not hypersensitivity pneumonitis as there has not been much response to steroids. too high risk for lung biopsy.  Procalcitonin not significantly elevated and respiratory viral panel negative.  CTA chest this admission with no PE, diffuse GGO.  Evaluated by advanced heart failure, felt to have significant component of CHF/volume overload/HFpEF.   -Plan for continued diuresis as below  2. 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    RV failure on exam.   RHC showed severe PAH with severely elevated RA pressure and moderately decreased PAPi at 1.45, CI low at 1.8. RV failure in setting of  severe pulmonary hypertension.   Co-ox 65% this morning with CVP still 13-15.  She is diuresing well with  current regimen of Lasix gtt + milrinone 0.125.  - Continue Lasix 12 mg/hr ,  milrinone 0.125 while diuresing.  Dose of metolazone 2.5 today - Continue digoxin 0.125, spironolactone 25 daily - With GU infection (herpes flare), would hold off on SGLT2 inhibitor.  Midodrine 5 mg 3 times daily for hypotension  3. Pulmonary hypertension:  thought to be predominantly groups 2 and 3 PH from HFpEF, parenchymal lung disease (ILD process) and OSA.   has been on Tyvaso and sildenafil through her pulmonologist at [facility].  on 8L home oxygen  RHC in 2022 per report had elevated PCWP.   RHC in 6/23 showed mean RA 10, PA 90/40 mean 58, mean PCWP 10, CI 1.5. Repeat RHC this admission with normal PCWP, severe PAH with high RA pressure, moderately low PAPi, and CI 1.8.  Low cardiac output is concerning. Main issue here appears to be severe PAH with RV failure.    chance she would benefit from more aggressive pulmonary vasodilator treatment.  Continue milrinone infusion - Continue Sildenafil 40 mg tid , Tyvaso.  Following adequate diuresis, will need close outpatient followup with her [facility] specialist at [facility] regarding IV therapy.    4. Abdominal discomfort:  AST/ALT normal, RUQ [place] with no evidence for cholecystectomy.  Elevated bilirubin poss related from hepatic congestion from RV failure.   5. Venous thromboembolism:  History of provoked PE in 2019 in setting of ECMO, also had splenic vein thrombosis in 5/21.  She has been anticoagulated.  - Continue apixaban.     Total encounter time more than 50 minutes  Greater than 50% was spent in counseling and coordination of care with the patient   Signed, Dossie Arbour, MD, Ph.D [facility]   For questions or updates, please contact   Please consult [email or web address] for contact info under Cardiology/STEMI.

## 2022-10-15 NOTE — Consult Note (Signed)
PHARMACY CONSULT NOTE  Pharmacy Consult for Electrolyte Monitoring and Replacement   Recent Labs: Potassium (mmol/L)  Date Value  10/15/2022 3.7  06/06/2013 3.2 (L)   Magnesium (mg/dL)  Date Value  16/03/9603 2.7 (H)   Calcium (mg/dL)  Date Value  54/02/8118 9.7   Calcium, Total (mg/dL)  Date Value  14/78/2956 9.4   Albumin (g/dL)  Date Value  21/30/8657 4.0  06/06/2013 3.9   Phosphorus (mg/dL)  Date Value  84/69/6295 5.9 (H)   Sodium (mmol/L)  Date Value  10/15/2022 131 (L)  06/06/2013 139     Assessment: 27 yo F with significant PMHx of PHTN, HFpEF, ILD, COPD & PE on Eliquis presenting to [facility] ED on 10/10/22 after being sent over from urgent care due to hypoxia. Admit to SDU due to acute on chronic hypoxic respiratory failure multifocal in the setting of mild HFpEF exacerbation, ILD, PHTN & COPD as well as HSV outbreak. Pharmacy has been consulted to monitor and replace electrolytes while under PCCM care.  Pertinent medications: Lasix gtt, spironolactone 25mg  daily   Diet/Nutrition: sodium(<2g) and fluid(<1593mL) restriction  Goal of Therapy:  Potassium 4.0 - 5.1 mmol/L Magnesium 2.0 - 2.4 mg/dL All other electrolytes WNL  Plan:  ---oral KCl 40 mEq x 1 ---Will recheck electrolytes tomorrow with AM labs  Lowella Bandy ,PharmD Clinical Pharmacist 10/15/2022 6:37 AM

## 2022-10-15 NOTE — Progress Notes (Signed)
RN contacted and notified a new VBG sample is needed due to 1 cc of air being in the syringe and the sample was not sent down to lab on ice. RN also informed that the sample must be labeled with the appropriate ordered [employer] label vs. pt label in order for the sample to be processed and crossed over to EPIC.

## 2022-10-15 NOTE — Plan of Care (Signed)
Continuing with plan of care. 

## 2022-10-16 DIAGNOSIS — I5033 Acute on chronic diastolic (congestive) heart failure: Secondary | ICD-10-CM

## 2022-10-16 DIAGNOSIS — I2721 Secondary pulmonary arterial hypertension: Secondary | ICD-10-CM

## 2022-10-16 DIAGNOSIS — J9621 Acute and chronic respiratory failure with hypoxia: Secondary | ICD-10-CM

## 2022-10-16 DIAGNOSIS — I2781 Cor pulmonale (chronic): Secondary | ICD-10-CM

## 2022-10-16 LAB — COOXEMETRY PANEL
Carboxyhemoglobin: 1 % (ref 0.5–1.5)
Methemoglobin: 0.9 % (ref 0.0–1.5)
O2 Saturation: 57.4 %
Total hemoglobin: 19.3 g/dL — ABNORMAL HIGH (ref 12.0–16.0)
Total oxygen content: 56.3 %

## 2022-10-16 LAB — BASIC METABOLIC PANEL
Anion gap: 13 (ref 5–15)
BUN: 33 mg/dL — ABNORMAL HIGH (ref 6–20)
CO2: 27 mmol/L (ref 22–32)
Calcium: 9.4 mg/dL (ref 8.9–10.3)
Chloride: 86 mmol/L — ABNORMAL LOW (ref 98–111)
Creatinine, Ser: 1.28 mg/dL — ABNORMAL HIGH (ref 0.44–1.00)
GFR, Estimated: 60 mL/min — ABNORMAL LOW (ref >60)
Glucose, Bld: 277 mg/dL — ABNORMAL HIGH (ref 70–99)
Potassium: 3.6 mmol/L (ref 3.5–5.1)
Sodium: 126 mmol/L — ABNORMAL LOW (ref 135–145)

## 2022-10-16 LAB — CBC
HCT: 52 % — ABNORMAL HIGH (ref 36.0–46.0)
Hemoglobin: 18.2 g/dL — ABNORMAL HIGH (ref 12.0–15.0)
MCH: 26.7 pg (ref 26.0–34.0)
MCHC: 35 g/dL (ref 30.0–36.0)
MCV: 76.4 fL — ABNORMAL LOW (ref 80.0–100.0)
Platelets: 324 10*3/uL (ref 150–400)
RBC: 6.81 MIL/uL — ABNORMAL HIGH (ref 3.87–5.11)
RDW: 20.4 % — ABNORMAL HIGH (ref 11.5–15.5)
WBC: 14.1 10*3/uL — ABNORMAL HIGH (ref 4.0–10.5)
nRBC: 0.2 % (ref 0.0–0.2)

## 2022-10-16 LAB — MAGNESIUM: Magnesium: 2.7 mg/dL — ABNORMAL HIGH (ref 1.7–2.4)

## 2022-10-16 LAB — PHOSPHORUS: Phosphorus: 5.2 mg/dL — ABNORMAL HIGH (ref 2.5–4.6)

## 2022-10-16 LAB — CULTURE, BLOOD (ROUTINE X 2)

## 2022-10-16 MED ORDER — POTASSIUM CHLORIDE CRYS ER 20 MEQ PO TBCR
40.0000 meq | EXTENDED_RELEASE_TABLET | Freq: Once | ORAL | Status: AC
  Administered 2022-10-16: 40 meq via ORAL
  Filled 2022-10-16: qty 2

## 2022-10-16 NOTE — Progress Notes (Signed)
-   Pt family brought in fast food per pt request. Pt educated on ordered diet and impact of diet on CHF. Pt able to perform teach-back/verbalize understanding and decided to abandon ordered diet and eat the fast food.   - Pt encouraged to wear BP cuff, cardiac monitoring and pulse ox throughout shift. Education provided to pt on importance of continuous monitoring, pt able to perform teach-back on education/verbalize understanding. Pt still removes monitoring devices after re-applied by RN. MD aware of non-compliance.

## 2022-10-16 NOTE — Progress Notes (Signed)
PROGRESS NOTE    Yesenia Carrillo  RUE:[id number] DOB: November 29, 1995 DOA: 10/10/2022  PCP: [facility]   Brief Narrative:  This 27 years old female with PMH significant for pulmonary hypertension, HFp EF, interstitial lung disease, COPD, PE on Eliquis presented in the ED after being sent over from urgent care for hypoxia.  Patient reports not feeling well with generalized fatigue for last 1 week.  She went to urgent care for genital herpes outbreak and was found to be hypoxic with SpO2 of 80% on her baseline oxygen 8 L/min. She reports to the ED physician that her feet appears more swollen and has not been checking her weight. She reports being compliant with all her medication including Eliquis.  She reports at home she has not been monitoring her SpO2 but titrated her oxygen up to 8 L due to shortness of breath, Her most recent pulmonology appointment showed her at 6 L oxygen as an outpatient.  She has sleep apnea and now been using BiPAP at night.  Upon arrival in the ED,  She was hypoxic with SpO2 68% on 8 L of nasal cannula, she was placed on nonrebreather and subsequently required high flow nasal cannula at 75% 40 L.  She was found to have elevated troponin suggestive of demand ischemia, lactic acidosis, hypokalemia.  CTA chest ruled out pulmonary embolism.  Patient was admitted in the ICU.  Patient is evaluated by heart failure team, She is now requiring Lasix and milrinone infusion. TRH pickup 5/10.   Assessment & Plan:   Principal Problem:   Acute on chronic respiratory failure (HCC) Active Problems:   Pulmonary hypertension, unspecified (HCC)   Herpes simplex vulvovaginitis   Acute on chronic heart failure with preserved ejection fraction (HCC)  Acute on chronic hypoxic respiratory failure: Likely acute diastolic CHF, volume overload: Interstitial lung disease: Pulmonary artery hypertension: At baseline she uses 8 L via nasal cannula.  Now requiring high flow  nasal cannula.   She does have interstitial lung disease of unclear etiology. She might have some form of inflammatory pneumonitis.   CTA chest ruled out pulmonary embolism.  Procalcitonin not significantly elevated and respiratory viral panel negative.  No need for antibiotics at this time.  Continue diuresis as per Cardiology. Steroid discontinued as per cardiology. Heart failure team recommendation appreciated Echocardiogram this admission showed LVEF 55 to 60%, there is an evidence of right ventricular failure on exam. RHC showed severe PAH with severely elevated RA pressure and moderately decreased PAP. She was continued on Lasix and milrinone infusion.  She was given a dose of metolazone x 1. Milrinone and Lasix infusion discontinued today Continue digoxin and spironolactone. She is started on sildenafil 40 mg 3 times daily, continue Tyvaso. She reports feeling slightly better,  she is back to her baseline O2 requirement 8 L of oxygen. Heart failure team recommended if she remains stable tomorrow can be discharged home on torsemide 40 mg twice daily  Abdominal pain: Abdominal ultrasound is normal.  History of pulmonary embolism: Initiated on IV heparin, now changed to Eliquis.  Morbid Obesity: Diet and Exercise discussed in detail. Estimated body mass index is 42.82 kg/m as calculated from the following:   Height as of this encounter: 5\' 1"  (1.549 m).   Weight as of this encounter: 102.8 kg.  Diet and exercise discussed in detail.  DVT prophylaxis: Eliquis Code Status:Full code. Family Communication: No family at bedside. Disposition Plan:    Status is: Inpatient Remains inpatient appropriate  because: Admitted for acute on chronic hypoxic respiratory failure requiring high flow nasal cannula.  She has acute on chronic diastolic heart failure requiring Lasix and milrinone infusion.  Heart failure team following.  Anticipated discharge home tomorrow.    Consultants:   Cardiology  Procedures: Right heart cath Antimicrobials:  Anti-infectives (From admission, onward)    Start     Dose/Rate Route Frequency Ordered Stop   10/11/22 1000  valACYclovir (VALTREX) tablet 1,000 mg        1,000 mg Oral 2 times daily 10/11/22 0335 10/15/22 2138   10/11/22 0030  vancomycin (VANCOREADY) IVPB 2000 mg/400 mL       See Hyperspace for full Linked Orders Report.   2,000 mg 200 mL/hr over 120 Minutes Intravenous  Once 10/11/22 0017 10/11/22 0333   10/11/22 0030  vancomycin (VANCOREADY) IVPB 500 mg/100 mL       See Hyperspace for full Linked Orders Report.   500 mg 100 mL/hr over 60 Minutes Intravenous  Once 10/11/22 0017 10/12/22 0702   10/11/22 0000  vancomycin (VANCOREADY) IVPB 2000 mg/400 mL  Status:  Discontinued        2,000 mg 200 mL/hr over 120 Minutes Intravenous  Once 10/10/22 2357 10/11/22 0016   10/11/22 0000  piperacillin-tazobactam (ZOSYN) IVPB 3.375 g        3.375 g 100 mL/hr over 30 Minutes Intravenous  Once 10/10/22 2357 10/11/22 0130      Subjective: Patient was seen and examined at bedside.  Overnight events noted. Patient remains on high flow oxygen 8 L.  She is down from 242 to 226 pounds Patient reports she has taken shower by self.  Does not have shortness of breath.   Objective: Vitals:   10/16/22 1130 10/16/22 1145 10/16/22 1200 10/16/22 1328  BP:  (!) 112/92 113/75   Pulse: (!) 122 (!) 115 (!) 111   Resp:  (!) 30 (!) 34   Temp:  98.2 F (36.8 C)    TempSrc:  Oral    SpO2:  98% 92% 96%  Weight:      Height:        Intake/Output Summary (Last 24 hours) at 10/16/2022 1437 Last data filed at 10/16/2022 1232 Gross per 24 hour  Intake 1818.05 ml  Output 1000 ml  Net 818.05 ml    Filed Weights   10/14/22 0450 10/15/22 0238 10/16/22 0500  Weight: 104.6 kg 104.8 kg 102.8 kg    Examination:  General exam: Appears comfortable, deconditioned, morbidly obese, not in any acute distress. Respiratory system: CTA bilaterally.  Respiratory effort normal. RR 16 Cardiovascular system: S1 & S2 heard, regular rate and rhythm, no murmur. Gastrointestinal system: Abdomen is soft, non tender, non distended, BS+ Central nervous system: Alert and oriented x 3. No focal neurological deficits. Extremities: Edema+ no cyanosis, no clubbing, Skin: No rashes, lesions or ulcers Psychiatry: Judgement and insight appear normal. Mood & affect appropriate.     Data Reviewed: I have personally reviewed following labs and imaging studies  CBC: Recent Labs  Lab 10/10/22 1947 10/11/22 0517 10/12/22 0603 10/12/22 1006 10/12/22 1009 10/12/22 1011 10/13/22 0617 10/15/22 0530 10/16/22 0353  WBC 15.6* 17.4* 15.4*  --   --   --  21.1* 13.6* 14.1*  NEUTROABS 9.8*  --  13.1*  --   --   --   --   --   --   HGB 15.7* 15.4* 15.5*   < > 17.0* 17.0* 15.6* 17.9* 18.2*  HCT 45.3 46.1* 43.5   < >  50.0* 50.0* 44.4 50.6* 52.0*  MCV 78.4* 79.9* 76.9*  --   --   --  76.9* 76.9* 76.4*  PLT 334 290 308  --   --   --  331 349 324   < > = values in this interval not displayed.    Basic Metabolic Panel: Recent Labs  Lab 10/12/22 0603 10/12/22 1006 10/12/22 1713 10/13/22 0617 10/14/22 0430 10/15/22 0530 10/16/22 0353  NA 134*   < > 135 134* 134* 131* 126*  K 4.3   < > 4.3 3.7 4.2 3.7 3.6  CL 100  --  96* 93* 91* 88* 86*  CO2 23  --  27 30 31 31 27   GLUCOSE 144*  --  123* 103* 102* 197* 277*  BUN 20  --  20 22* 28* 29* 33*  CREATININE 1.28*  --  1.09* 1.01* 1.13* 1.10* 1.28*  CALCIUM 9.6  --  9.8 9.6 9.9 9.7 9.4  MG 2.5*  --  2.6* 2.7*  --  2.7* 2.7*  PHOS 4.1  --  4.1 4.4  --  5.9* 5.2*   < > = values in this interval not displayed.    GFR: Estimated Creatinine Clearance: 74 mL/min (A) (by C-G formula based on SCr of 1.28 mg/dL (H)). Liver Function Tests: Recent Labs  Lab 10/10/22 2047 10/12/22 0603  AST 29 34  ALT 18 18  ALKPHOS 109 121  BILITOT 5.8* 4.2*  PROT 7.3 7.9  ALBUMIN 3.6 4.0    Recent Labs  Lab  10/10/22 2047  LIPASE 25    No results for input(s): "AMMONIA" in the last 168 hours. Coagulation Profile: Recent Labs  Lab 10/11/22 1726 10/12/22 0603  INR 2.0* 1.7*    Cardiac Enzymes: No results for input(s): "CKTOTAL", "CKMB", "CKMBINDEX", "TROPONINI" in the last 168 hours. BNP (last 3 results) No results for input(s): "PROBNP" in the last 8760 hours. HbA1C: No results for input(s): "HGBA1C" in the last 72 hours. CBG: Recent Labs  Lab 10/11/22 0236  GLUCAP 122*    Lipid Profile: No results for input(s): "CHOL", "HDL", "LDLCALC", "TRIG", "CHOLHDL", "LDLDIRECT" in the last 72 hours. Thyroid Function Tests: No results for input(s): "TSH", "T4TOTAL", "FREET4", "T3FREE", "THYROIDAB" in the last 72 hours. Anemia Panel: No results for input(s): "VITAMINB12", "FOLATE", "FERRITIN", "TIBC", "IRON", "RETICCTPCT" in the last 72 hours. Sepsis Labs: Recent Labs  Lab 10/10/22 2047 10/11/22 0025 10/11/22 0420 10/11/22 0517 10/11/22 1318 10/11/22 1732 10/12/22 0603 10/13/22 0617  PROCALCITON <0.10  --  0.13  --   --   --   --   --   LATICACIDVEN  --    < >  --    < > 1.6 2.3* 2.0* 1.9   < > = values in this interval not displayed.     Recent Results (from the past 240 hour(s))  SARS Coronavirus 2 by RT PCR (hospital order, performed in [facility] hospital lab) *cepheid single result test*     Status: None   Collection Time: 10/10/22  7:26 PM  Result Value Ref Range Status   SARS Coronavirus 2 by RT PCR NEGATIVE NEGATIVE Final    Comment: (NOTE) SARS-CoV-2 target nucleic acids are NOT DETECTED.  The SARS-CoV-2 RNA is generally detectable in upper and lower respiratory specimens during the acute phase of infection. The lowest concentration of SARS-CoV-2 viral copies this assay can detect is 250 copies / mL. A negative result does not preclude SARS-CoV-2 infection and should not be used as  the sole basis for treatment or other patient management decisions.  A negative  result may occur with improper specimen collection / handling, submission of specimen other than nasopharyngeal swab, presence of viral mutation(s) within the areas targeted by this assay, and inadequate number of viral copies (<250 copies / mL). A negative result must be combined with clinical observations, patient history, and epidemiological information.  Fact Sheet for Patients:   RoadLapTop.co.za  Fact Sheet for Healthcare Providers: http://kim-miller.com/  This test is not yet approved or  cleared by the [place] FDA and has been authorized for detection and/or diagnosis of SARS-CoV-2 by FDA under an Emergency Use Authorization (EUA).  This EUA will remain in effect (meaning this test can be used) for the duration of the COVID-19 declaration under Section 564(b)(1) of the Act, 21 U.S.C. section 360bbb-3(b)(1), unless the authorization is terminated or revoked sooner.  Performed at [facility], [address] [place], [place] [address]   Blood culture (routine x 2)     Status: None   Collection Time: 10/11/22 12:25 AM   Specimen: BLOOD  Result Value Ref Range Status   Specimen Description BLOOD LEFT [facility]  Final   Special Requests   Final    BOTTLES DRAWN AEROBIC AND ANAEROBIC Blood Culture adequate volume   Culture   Final    NO GROWTH 5 DAYS Performed at [facility], [address] [place], [place] [address]    Report Status 10/16/2022 FINAL  Final  MRSA Next Gen by PCR, Nasal     Status: None   Collection Time: 10/11/22  2:52 AM  Result Value Ref Range Status   MRSA by PCR Next Gen NOT DETECTED NOT DETECTED Final    Comment: (NOTE) The GeneXpert MRSA Assay (FDA approved for NASAL specimens only), is one component of a comprehensive MRSA colonization surveillance program. It is not intended to diagnose MRSA infection nor to guide or monitor treatment for MRSA infections. Test performance is not  FDA approved in patients less than 93 years old. Performed at [facility], [address] [place], [place] [address]   Respiratory (~20 pathogens) panel by PCR     Status: None   Collection Time: 10/11/22  2:54 AM   Specimen: Nasopharyngeal Swab; Respiratory  Result Value Ref Range Status   Adenovirus NOT DETECTED NOT DETECTED Final   Coronavirus 229E NOT DETECTED NOT DETECTED Final    Comment: (NOTE) The Coronavirus on the Respiratory Panel, DOES NOT test for the novel  Coronavirus (2019 nCoV)    Coronavirus HKU1 NOT DETECTED NOT DETECTED Final   Coronavirus NL63 NOT DETECTED NOT DETECTED Final   Coronavirus OC43 NOT DETECTED NOT DETECTED Final   Metapneumovirus NOT DETECTED NOT DETECTED Final   Rhinovirus / Enterovirus NOT DETECTED NOT DETECTED Final   Influenza A NOT DETECTED NOT DETECTED Final   Influenza B NOT DETECTED NOT DETECTED Final   Parainfluenza Virus 1 NOT DETECTED NOT DETECTED Final   Parainfluenza Virus 2 NOT DETECTED NOT DETECTED Final   Parainfluenza Virus 3 NOT DETECTED NOT DETECTED Final   Parainfluenza Virus 4 NOT DETECTED NOT DETECTED Final   Respiratory Syncytial Virus NOT DETECTED NOT DETECTED Final   Bordetella pertussis NOT DETECTED NOT DETECTED Final   Bordetella Parapertussis NOT DETECTED NOT DETECTED Final   Chlamydophila pneumoniae NOT DETECTED NOT DETECTED Final   Mycoplasma pneumoniae NOT DETECTED NOT DETECTED Final    Comment: Performed at [facility], [address] [place],  [place] [address]  Wet prep, genital     Status: Abnormal   Collection Time: 10/11/22  3:33 AM  Result Value Ref Range Status   Yeast Wet Prep HPF POC NONE SEEN NONE SEEN Final   Trich, Wet Prep NONE SEEN NONE SEEN Final   Clue Cells Wet Prep HPF POC NONE SEEN NONE SEEN Final   WBC, Wet Prep HPF POC NONE SEEN (A) <10 Final   Sperm NONE SEEN  Final    Comment: Performed at [facility], [address] [place], [place] [address]  Chlamydia/NGC  rt PCR [facility] only)     Status: None   Collection Time: 10/11/22  4:36 AM  Result Value Ref Range Status   Specimen source GC/Chlam ENDOCERVICAL  Final   Chlamydia Tr NOT DETECTED NOT DETECTED Final   N gonorrhoeae NOT DETECTED NOT DETECTED Final    Comment: (NOTE) This CT/NG assay has not been evaluated in patients with a history of  hysterectomy. Performed at [facility], [address] [place], [place] [address]   Aerobic/Anaerobic Culture w Gram Stain (surgical/deep wound)     Status: Abnormal   Collection Time: 10/11/22  8:38 AM   Specimen: Vaginal  Result Value Ref Range Status   Specimen Description   Final    VAGINA Performed at [facility], [address] [place], [place] [address]    Special Requests   Final    NONE Performed at [facility], [address] [place], [place] [address]    Gram Stain   Final    NO WBC SEEN ABUNDANT GRAM NEGATIVE RODS FEW GRAM POSITIVE COCCI IN PAIRS    Culture (A)  Final    MULTIPLE ORGANISMS PRESENT, NONE PREDOMINANT RARE PREVOTELLA DISIENS BETA LACTAMASE POSITIVE Performed at [facility], [address] [place], [place] [address]    Report Status 10/16/2022 FINAL  Final     Radiology Studies: No results found.  Scheduled Meds:  acetaminophen  1,000 mg Oral Q6H   apixaban  5 mg Oral BID   budesonide (PULMICORT) nebulizer solution  0.25 mg Nebulization BID   Chlorhexidine Gluconate Cloth  6 each Topical Daily   digoxin  0.125 mg Oral Daily   famotidine  20 mg Oral BID   ipratropium-albuterol  3 mL Nebulization TID   midodrine  5 mg Oral TID WC   sildenafil  40 mg Oral TID   sodium chloride flush  10-40 mL Intracatheter Q12H   sodium chloride flush  3 mL Intravenous Q12H   sodium chloride flush  3 mL Intravenous Q12H   spironolactone  25 mg Oral Daily   Treprostinil  18 mcg Inhalation Q4H while awake   Continuous Infusions:  sodium chloride       LOS: 5 days    Time spent:  35 mins    Willeen Niece, MD [facility]   If 7PM-7AM, please contact night-coverage

## 2022-10-16 NOTE — Consult Note (Addendum)
PHARMACY CONSULT NOTE  Pharmacy Consult for Electrolyte Monitoring and Replacement   Recent Labs: Potassium (mmol/L)  Date Value  10/16/2022 3.6  06/06/2013 3.2 (L)   Magnesium (mg/dL)  Date Value  16/03/9603 2.7 (H)   Calcium (mg/dL)  Date Value  54/02/8118 9.4   Calcium, Total (mg/dL)  Date Value  14/78/2956 9.4   Albumin (g/dL)  Date Value  21/30/8657 4.0  06/06/2013 3.9   Phosphorus (mg/dL)  Date Value  84/69/6295 5.2 (H)   Sodium (mmol/L)  Date Value  10/16/2022 126 (L)  06/06/2013 139   Assessment: 27 yo F with significant PMHx of PHTN, HFpEF, ILD, COPD & PE on Eliquis presenting to [facility] ED on 10/10/22 after being sent over from urgent care due to hypoxia. Admit to SDU due to acute on chronic hypoxic respiratory failure multifocal in the setting of mild HFpEF exacerbation, ILD, PHTN & COPD as well as HSV outbreak. Pharmacy has been consulted to monitor and replace electrolytes while under PCCM care.  Pertinent medications: Lasix gtt at 12 mg/hr + Aldactone 25 mg daily + intermittent metolazone   Diet/Nutrition: sodium (<2g) and fluid (<1577mL) restriction  Goal of Therapy:  Potassium 4.0 - 5.1 mmol/L Magnesium 2.0 - 2.4 mg/dL All other electrolytes within normal limits  Plan:  --Na 126, down-trending s/p metolazone yesterday. Continue to monitor --K 3.6, Kcl 40 mEq PO x 1 --Will recheck electrolytes tomorrow with AM labs  Tressie Ellis 10/16/2022 7:54 AM

## 2022-10-16 NOTE — Progress Notes (Signed)
Patient ID: Yesenia Carrillo, female   DOB: 08/29/1995, 27 y.o.   MRN: [id number]     Advanced Heart Failure Rounding Note  PCP-Cardiologist: None   Subjective:    Remains on milrinone 0.125 and lasix gtt at 12. Weight down 14 pounds over the weekend 240 -> 226.    Scr up 1.1 -> 1.3. Sodium 131 -> 126  Co-ox 62% yesterday. Not drawn this am. CVP charted as 14 - on my check down to 5. Remains tachycardic   Drinking lots of fluids. Has large water bottle in her lap. Asking for more food/drink. Says she is cramping from lasix. Wants to go home.    RHC Procedural Findings: Hemodynamics (mmHg) RA mean 26 RV 64/29 PA 77/39, mean 55 PCWP mean 9 Oxygen saturations: SVC 65% PA 62% AO 97% Cardiac Output (Fick) 3.67  Cardiac Index (Fick) 1.8 PVR 12.5 WU PAPi 1.46    Objective:   Weight Range: 102.8 kg Body mass index is 42.82 kg/m.   Vital Signs:   Temp:  [98 F (36.7 C)-98.1 F (36.7 C)] 98.1 F (36.7 C) (05/13 0845) Pulse Rate:  [116-124] 117 (05/13 0845) Resp:  [19-27] 27 (05/13 0845) BP: (88-119)/(54-91) 115/87 (05/13 0845) SpO2:  [80 %-97 %] 94 % (05/13 0845) Weight:  [102.8 kg] 102.8 kg (05/13 0500) Last BM Date :  (PTA)  Weight change: Filed Weights   10/14/22 0450 10/15/22 0238 10/16/22 0500  Weight: 104.6 kg 104.8 kg 102.8 kg    Intake/Output:   Intake/Output Summary (Last 24 hours) at 10/16/2022 1027 Last data filed at 10/16/2022 0850 Gross per 24 hour  Intake 1810.84 ml  Output 1750 ml  Net 60.84 ml       Physical Exam    General: Obese woman sitting up in bed. No resp difficulty HEENT: normal Neck: supple. no JVD. Carotids 2+ bilat; no bruits. No lymphadenopathy or thryomegaly appreciated. Cor: PMI nondisplaced. Regular tachy 2/6 TR Lungs: clear Abdomen: obese soft, nontender, nondistended. No hepatosplenomegaly. No bruits or masses. Good bowel sounds. Extremities: no cyanosis, clubbing, rash, edema Neuro: alert & orientedx3, cranial nerves  grossly intact. moves all 4 extremities w/o difficulty. Affect pleasant   Telemetry   Sinus 110-120 Personally reviewed  Labs    CBC Recent Labs    10/15/22 0530 10/16/22 0353  WBC 13.6* 14.1*  HGB 17.9* 18.2*  HCT 50.6* 52.0*  MCV 76.9* 76.4*  PLT 349 324    Basic Metabolic Panel Recent Labs    40/98/11 0530 10/16/22 0353  NA 131* 126*  K 3.7 3.6  CL 88* 86*  CO2 31 27  GLUCOSE 197* 277*  BUN 29* 33*  CREATININE 1.10* 1.28*  CALCIUM 9.7 9.4  MG 2.7* 2.7*  PHOS 5.9* 5.2*    Liver Function Tests No results for input(s): "AST", "ALT", "ALKPHOS", "BILITOT", "PROT", "ALBUMIN" in the last 72 hours.  No results for input(s): "LIPASE", "AMYLASE" in the last 72 hours.  Cardiac Enzymes No results for input(s): "CKTOTAL", "CKMB", "CKMBINDEX", "TROPONINI" in the last 72 hours.  BNP: BNP (last 3 results) Recent Labs    08/09/22 0501 09/26/22 2131 10/10/22 1947  BNP 251.2* 462.9* 399.9*     ProBNP (last 3 results) No results for input(s): "PROBNP" in the last 8760 hours.   D-Dimer No results for input(s): "DDIMER" in the last 72 hours. Hemoglobin A1C No results for input(s): "HGBA1C" in the last 72 hours. Fasting Lipid Panel No results for input(s): "CHOL", "HDL", "LDLCALC", "TRIG", "CHOLHDL", "LDLDIRECT" in  the last 72 hours. Thyroid Function Tests No results for input(s): "TSH", "T4TOTAL", "T3FREE", "THYROIDAB" in the last 72 hours.  Invalid input(s): "FREET3"  Other results:   Imaging    No results found.   Medications:     Scheduled Medications:  acetaminophen  1,000 mg Oral Q6H   apixaban  5 mg Oral BID   budesonide (PULMICORT) nebulizer solution  0.25 mg Nebulization BID   Chlorhexidine Gluconate Cloth  6 each Topical Daily   digoxin  0.125 mg Oral Daily   famotidine  20 mg Oral BID   ipratropium-albuterol  3 mL Nebulization TID   midodrine  5 mg Oral TID WC   potassium chloride  40 mEq Oral Once   sildenafil  40 mg Oral TID    sodium chloride flush  10-40 mL Intracatheter Q12H   sodium chloride flush  3 mL Intravenous Q12H   sodium chloride flush  3 mL Intravenous Q12H   spironolactone  25 mg Oral Daily   Treprostinil  18 mcg Inhalation Q4H while awake    Infusions:  sodium chloride     furosemide (LASIX) 200 mg in dextrose 5 % 100 mL (2 mg/mL) infusion 12 mg/hr (10/16/22 0700)   milrinone 0.125 mcg/kg/min (10/16/22 0700)    PRN Medications: sodium chloride, acetaminophen, calcium carbonate, cyclobenzaprine, docusate sodium, hydrocortisone cream, lidocaine, ondansetron (ZOFRAN) IV, mouth rinse, polyethylene glycol, sodium chloride flush, sodium chloride flush   Assessment/Plan   1. Acute on chronic hypoxemic respiratory failure: Patient is on home oxygen 8L [place] at baseline, now requiring HFNC.  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 PFTs have shown restriction with severely decreased DLCO and serial CTs have shown diffuse GGOs and bibasilar scattered consolidation.  Suspect some form of inflammatory pneumonitis, probably not hypersensitivity pneumonitis as there has not been much response to steroids. She was thought to be too high risk for lung biopsy. CTA chest this admission with no PE, diffuse GGO.  Suspect that there is a significant component of CHF/volume overload/HFpEF.  Procalcitonin not significantly elevated and respiratory viral panel negative.  - Steroids stopped due to lack of response in the past.  - No antibiotics at this time.   2. 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  There is evidence for RV failure on exam. Suspect some of GGO on CTA chest is  related to pulmonary edema.  Lactate trending down 2.2 => 2.0 => 1.9, worrisome for poor cardiac output in setting of RV failure. RHC showed severe PAH with severely elevated RA pressure and moderately decreased PAPi at 1.45, CI low at 1.8. RV failure in setting of severe pulmonary hypertension. Has diuresed well on Lasix gtt + milrinone 0.125.  CVP 5 -  I think this is as good as we can get her.  - Stop milrinone and lasix gtt - Continue digoxin 0.125 - Continue spironolactone 25 daily - With GU infection (herpes flare), would hold off on SGLT2 inhibitor.  - Can go to floor. If stable, home tomorrow on torsemide 40 bid  3. Pulmonary hypertension: This has been thought to be predominantly groups 2 and 3 PH from HFpEF, parenchymal lung disease (ILD process) and OSA. However, she has been on Tyvaso and sildenafil through  her pulmonologist at [facility].  She is already on 8L home oxygen and I worry that she is heading towards end stage RV failure. RHC in 2022 per report had elevated PCWP.  RHC in 6/23 showed mean RA 10, PA 90/40 mean 58, mean PCWP 10, CI 1.5. Repeat RHC this admission with normal PCWP, severe PAH with high RA pressure, moderately low PAPi, and CI 1.8.  Low cardiac output is concerning. Main issue here appears to be severe PAH with RV failure.   Question at this point is whether there is a significant group 1 component that would benefit from IV pulmonary vasodilators (more aggressive PH treatment).  Discussed with pulmonary (Dr. Aundria Rud), pulmonary parenchymal disease does not appear severe enough to be driver for severe group 3 PH, so may be a chance she would benefit from more aggressive pulmonary vasodilator treatment. 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 - Sildenafil increased to 40 mg tid.  - Continue Tyvaso.  - Stop milrinone today. Will need f/u at [facility]. I have asked her to reach out to them via MyChart - Will need outpatient eval for Bipap  4. Venous thromboembolism: Had  provoked PE in 2019 in setting of ECMO, also had splenic vein thrombosis in 5/21.  She has been anticoagulated.  - Continue apixaban.   5. Hyponatremia, hypervolemic - stressed need to restrict free water  6. Hypokalemia - supp     Length of Stay: 5  Arvilla Meres, MD  10/16/2022, 10:27 AM

## 2022-10-17 DIAGNOSIS — A6004 Herpesviral vulvovaginitis: Secondary | ICD-10-CM

## 2022-10-17 DIAGNOSIS — J9621 Acute and chronic respiratory failure with hypoxia: Secondary | ICD-10-CM

## 2022-10-17 DIAGNOSIS — I272 Pulmonary hypertension, unspecified: Secondary | ICD-10-CM | POA: Diagnosis not present

## 2022-10-17 DIAGNOSIS — I2721 Secondary pulmonary arterial hypertension: Secondary | ICD-10-CM

## 2022-10-17 DIAGNOSIS — I2609 Other pulmonary embolism with acute cor pulmonale: Secondary | ICD-10-CM

## 2022-10-17 DIAGNOSIS — Z6841 Body Mass Index (BMI) 40.0 and over, adult: Secondary | ICD-10-CM

## 2022-10-17 LAB — PHOSPHORUS: Phosphorus: 6.4 mg/dL — ABNORMAL HIGH (ref 2.5–4.6)

## 2022-10-17 LAB — CBC
HCT: 53.5 % — ABNORMAL HIGH (ref 36.0–46.0)
Hemoglobin: 18.5 g/dL — ABNORMAL HIGH (ref 12.0–15.0)
MCH: 26.9 pg (ref 26.0–34.0)
MCHC: 34.6 g/dL (ref 30.0–36.0)
MCV: 77.8 fL — ABNORMAL LOW (ref 80.0–100.0)
Platelets: 346 10*3/uL (ref 150–400)
RBC: 6.88 MIL/uL — ABNORMAL HIGH (ref 3.87–5.11)
RDW: 20.9 % — ABNORMAL HIGH (ref 11.5–15.5)
WBC: 12.6 10*3/uL — ABNORMAL HIGH (ref 4.0–10.5)
nRBC: 0 % (ref 0.0–0.2)

## 2022-10-17 LAB — COOXEMETRY PANEL
Carboxyhemoglobin: 1.2 % (ref 0.5–1.5)
Methemoglobin: <0.7 % (ref 0.0–1.5)
O2 Saturation: 62.6 %
Total hemoglobin: 18.7 g/dL — ABNORMAL HIGH (ref 12.0–16.0)
Total oxygen content: 61.7 %

## 2022-10-17 LAB — BASIC METABOLIC PANEL
Anion gap: 11 (ref 5–15)
BUN: 40 mg/dL — ABNORMAL HIGH (ref 6–20)
CO2: 29 mmol/L (ref 22–32)
Calcium: 9.6 mg/dL (ref 8.9–10.3)
Chloride: 93 mmol/L — ABNORMAL LOW (ref 98–111)
Creatinine, Ser: 1.62 mg/dL — ABNORMAL HIGH (ref 0.44–1.00)
GFR, Estimated: 45 mL/min — ABNORMAL LOW (ref >60)
Glucose, Bld: 90 mg/dL (ref 70–99)
Potassium: 4.4 mmol/L (ref 3.5–5.1)
Sodium: 132 mmol/L — ABNORMAL LOW (ref 135–145)

## 2022-10-17 LAB — MAGNESIUM: Magnesium: 3.1 mg/dL — ABNORMAL HIGH (ref 1.7–2.4)

## 2022-10-17 MED ORDER — LIDOCAINE HCL URETHRAL/MUCOSAL 2 % EX GEL: 1.0000 | Freq: Every day | CUTANEOUS | 0 refills | Status: DC | PRN

## 2022-10-17 MED ORDER — TORSEMIDE 20 MG PO TABS: 40.0000 mg | ORAL_TABLET | Freq: Two times a day (BID) | ORAL | 1 refills | Status: AC

## 2022-10-17 MED ORDER — TORSEMIDE 20 MG PO TABS
40.0000 mg | ORAL_TABLET | Freq: Two times a day (BID) | ORAL | Status: DC
  Administered 2022-10-17: 40 mg via ORAL
  Filled 2022-10-17: qty 2

## 2022-10-17 MED ORDER — HYDROCORTISONE 1 % EX CREA: TOPICAL_CREAM | CUTANEOUS | 0 refills | Status: DC | PRN

## 2022-10-17 MED ORDER — DIGOXIN 125 MCG PO TABS: 0.1250 mg | ORAL_TABLET | Freq: Every day | ORAL | 1 refills | Status: AC

## 2022-10-17 MED ORDER — MIDODRINE HCL 5 MG PO TABS: 5.0000 mg | ORAL_TABLET | Freq: Three times a day (TID) | ORAL | 0 refills | Status: DC

## 2022-10-17 MED ORDER — CALCIUM CARBONATE ANTACID 500 MG PO CHEW: 1.0000 | CHEWABLE_TABLET | Freq: Three times a day (TID) | ORAL | 0 refills | Status: AC | PRN

## 2022-10-17 NOTE — Progress Notes (Signed)
Reviewed d/c instructions with pt, Rx card given to pt, f/u appointments reviewed, PICC d/c, pressure dressing applied. No bleeding noted. MD at bedside during care.

## 2022-10-17 NOTE — Consult Note (Signed)
PHARMACY CONSULT NOTE  Pharmacy Consult for Electrolyte Monitoring and Replacement   Recent Labs: Potassium (mmol/L)  Date Value  10/17/2022 4.4  06/06/2013 3.2 (L)   Magnesium (mg/dL)  Date Value  16/03/9603 3.1 (H)   Calcium (mg/dL)  Date Value  54/02/8118 9.6   Calcium, Total (mg/dL)  Date Value  14/78/2956 9.4   Albumin (g/dL)  Date Value  21/30/8657 4.0  06/06/2013 3.9   Phosphorus (mg/dL)  Date Value  84/69/6295 6.4 (H)   Sodium (mmol/L)  Date Value  10/17/2022 132 (L)  06/06/2013 139   Assessment: 27 yo F with significant PMHx of PHTN, HFpEF, ILD, COPD & PE on Eliquis presenting to [facility] ED on 10/10/22 after being sent over from urgent care due to hypoxia. Admit to SDU due to acute on chronic hypoxic respiratory failure multifocal in the setting of mild HFpEF exacerbation, ILD, PHTN & COPD as well as HSV outbreak. Pharmacy has been consulted to monitor and replace electrolytes while under PCCM care.  Pertinent medications: Lasix gtt at 12 mg/hr (discontinued 5/13) + Aldactone 25 mg daily   Diet/Nutrition: sodium (<2g) and fluid (<1560mL) restriction  Goal of Therapy:  Potassium 4.0 - 5.1 mmol/L Magnesium 2.0 - 2.4 mg/dL All other electrolytes within normal limits  Plan:  --No electrolyte replacement indicated at this time. Patient appears to be nearing discharge. Will discontinue electrolyte consult at this time. Defer further ordering of labs and electrolyte replacement to primary team --Pharmacy will continue to follow along peripherally  Tressie Ellis 10/17/2022 7:40 AM

## 2022-10-17 NOTE — Discharge Instructions (Addendum)
Total oral fluid restriction to 1500 cc/24 hour F/u [facility] PAH clinic--make your appt thru [facility] my chart--mother informed as well  Your Empagliflozin (jardiance) is on HOLD till you recover from your genital infection. Discuss with your [facility] cardiology as to when it can be resumed

## 2022-10-17 NOTE — Progress Notes (Signed)
Patient ID: Yesenia Carrillo, female   DOB: 1995/12/14, 27 y.o.   MRN: [id number]     Advanced Heart Failure Rounding Note  PCP-Cardiologist: None   Subjective:    Milrinone and lasix stopped yesterday. Co-ox 63%  Sodium improved to 132   Weight down another 6 pounds. SCr up 1.3 ->1.6 SBP soft but stable.   Eager to go home. Denies SOB, orthopnea or PND. CVP 10-1   RHC Procedural Findings: Hemodynamics (mmHg) RA mean 26 RV 64/29 PA 77/39, mean 55 PCWP mean 9 Oxygen saturations: SVC 65% PA 62% AO 97% Cardiac Output (Fick) 3.67  Cardiac Index (Fick) 1.8 PVR 12.5 WU PAPi 1.46    Objective:   Weight Range: 99.9 kg Body mass index is 41.61 kg/m.   Vital Signs:   Temp:  [97.5 F (36.4 C)-98.2 F (36.8 C)] 97.8 F (36.6 C) (05/14 0800) Pulse Rate:  [103-122] 104 (05/14 0800) Resp:  [15-34] 25 (05/14 0800) BP: (92-113)/(43-92) 92/43 (05/14 0800) SpO2:  [84 %-100 %] 100 % (05/14 0800) Weight:  [99.9 kg] 99.9 kg (05/14 0500) Last BM Date : 10/17/22  Weight change: Filed Weights   10/15/22 0238 10/16/22 0500 10/17/22 0500  Weight: 104.8 kg 102.8 kg 99.9 kg    Intake/Output:   Intake/Output Summary (Last 24 hours) at 10/17/2022 0917 Last data filed at 10/17/2022 0433 Gross per 24 hour  Intake 287.49 ml  Output 1900 ml  Net -1612.51 ml       Physical Exam    General: Obese woman sitting up in bed. No resp difficulty HEENT: normal Neck: supple. no JVD. Carotids 2+ bilat; no bruits. No lymphadenopathy or thryomegaly appreciated. Cor: PMI nondisplaced. Regular rate & rhythm. No rubs, gallops or murmurs. Lungs: clear Abdomen: obese soft, nontender, nondistended. No hepatosplenomegaly. No bruits or masses. Good bowel sounds. Extremities: no cyanosis, clubbing, rash, edema Neuro: alert & orientedx3, cranial nerves grossly intact. moves all 4 extremities w/o difficulty. Affect pleasant    Telemetry   Sinus 100-110 Personally reviewed  Labs    CBC Recent  Labs    10/16/22 0353 10/17/22 0446  WBC 14.1* 12.6*  HGB 18.2* 18.5*  HCT 52.0* 53.5*  MCV 76.4* 77.8*  PLT 324 346    Basic Metabolic Panel Recent Labs    40/98/11 0353 10/17/22 0446  NA 126* 132*  K 3.6 4.4  CL 86* 93*  CO2 27 29  GLUCOSE 277* 90  BUN 33* 40*  CREATININE 1.28* 1.62*  CALCIUM 9.4 9.6  MG 2.7* 3.1*  PHOS 5.2* 6.4*    Liver Function Tests No results for input(s): "AST", "ALT", "ALKPHOS", "BILITOT", "PROT", "ALBUMIN" in the last 72 hours.  No results for input(s): "LIPASE", "AMYLASE" in the last 72 hours.  Cardiac Enzymes No results for input(s): "CKTOTAL", "CKMB", "CKMBINDEX", "TROPONINI" in the last 72 hours.  BNP: BNP (last 3 results) Recent Labs    08/09/22 0501 09/26/22 2131 10/10/22 1947  BNP 251.2* 462.9* 399.9*     ProBNP (last 3 results) No results for input(s): "PROBNP" in the last 8760 hours.   D-Dimer No results for input(s): "DDIMER" in the last 72 hours. Hemoglobin A1C No results for input(s): "HGBA1C" in the last 72 hours. Fasting Lipid Panel No results for input(s): "CHOL", "HDL", "LDLCALC", "TRIG", "CHOLHDL", "LDLDIRECT" in the last 72 hours. Thyroid Function Tests No results for input(s): "TSH", "T4TOTAL", "T3FREE", "THYROIDAB" in the last 72 hours.  Invalid input(s): "FREET3"  Other results:   Imaging    No results  found.   Medications:     Scheduled Medications:  acetaminophen  1,000 mg Oral Q6H   apixaban  5 mg Oral BID   budesonide (PULMICORT) nebulizer solution  0.25 mg Nebulization BID   Chlorhexidine Gluconate Cloth  6 each Topical Daily   digoxin  0.125 mg Oral Daily   famotidine  20 mg Oral BID   ipratropium-albuterol  3 mL Nebulization TID   midodrine  5 mg Oral TID WC   sildenafil  40 mg Oral TID   sodium chloride flush  10-40 mL Intracatheter Q12H   sodium chloride flush  3 mL Intravenous Q12H   sodium chloride flush  3 mL Intravenous Q12H   spironolactone  25 mg Oral Daily    torsemide  40 mg Oral BID   Treprostinil  18 mcg Inhalation Q4H while awake    Infusions:  sodium chloride      PRN Medications: sodium chloride, acetaminophen, calcium carbonate, cyclobenzaprine, docusate sodium, hydrocortisone cream, lidocaine, ondansetron (ZOFRAN) IV, mouth rinse, polyethylene glycol, sodium chloride flush, sodium chloride flush   Assessment/Plan   1. Acute on chronic hypoxemic respiratory failure: Patient is on home oxygen 8L [place] at baseline, now requiring HFNC.  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 PFTs have shown restriction with severely decreased DLCO and serial CTs have shown diffuse GGOs and bibasilar scattered consolidation.  Suspect some form of inflammatory pneumonitis, probably not hypersensitivity pneumonitis as there has not been much response to steroids. She was thought to be too high risk for lung biopsy. CTA chest this admission with no PE, diffuse GGO.  Suspect that there is a significant component of CHF/volume overload/HFpEF.  Procalcitonin not significantly elevated and respiratory viral panel negative.  - improved/resolved  2. 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  There is evidence for RV failure on exam. Suspect some of GGO on CTA chest is related to pulmonary edema.  Lactate trending down 2.2 => 2.0 => 1.9, worrisome for poor cardiac output in setting of RV failure. RHC showed severe PAH with severely elevated RA pressure and moderately decreased PAPi at 1.45, CI low at 1.8. RV failure in setting of severe pulmonary hypertension. Has diuresed well. Now off milrinone. Co-ox stable  -  I think this is as  good as we can get her- Continue digoxin 0.125 - Continue spironolactone 25 daily - With GU infection (herpes flare), would hold off on SGLT2 inhibitor.  - Serum creatinine up slightly due to overdiuresis. Would hold diuretics for 2 days. Start torsemide 40 bid on Thursday as outpatient  3. Pulmonary hypertension: This has been thought to be predominantly groups 2 and 3 PH from HFpEF, parenchymal lung disease (ILD process) and OSA. However, she has been on Tyvaso and sildenafil through her pulmonologist at [facility].  She is already on 8L home oxygen and I worry that she is heading towards end stage RV failure. RHC in 2022 per report had elevated PCWP.  RHC in 6/23 showed mean RA 10, PA 90/40 mean 58, mean PCWP 10, CI 1.5. Repeat RHC this admission with normal PCWP, severe PAH with high RA pressure, moderately low PAPi, and CI 1.8.  Low cardiac output is concerning. Main issue here appears to be severe  PAH with RV failure.   Question at this point is whether there is a significant group 1 component that would benefit from IV pulmonary vasodilators (more aggressive PH treatment).  Discussed with pulmonary (Dr. Aundria Rud), pulmonary parenchymal disease does not appear severe enough to be driver for severe group 3 PH, so may be a chance she would benefit from more aggressive pulmonary vasodilator treatment. 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 - Sildenafil increased to 40 mg tid.  - Continue Tyvaso.  - Cardiac output stable off milrinone. Will need f/u at [facility]. I have asked her to reach out to them via MyChart - Will need outpatient eval for Bipap  4. 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  She has been anticoagulated.  - Continue apixaban.   5. Hyponatremia, hypervolemic - stressed need to restrict free water Improved  6. Hypokalemia - k 4.4  7. AKI, mild  - Serum creatinine up slightly due to overdiuresis. Would hold  diuretics for 2 days. Start torsemide 40 bid on Thursday as outpatient  Ok for d/c from my standpoint.  Patient not wanting to f/u in HF Clinic here. She assured me she will make appt with her PAH docs at [facility] in near future. (We discussed this twice).     Length of Stay: 6  Arvilla Meres, MD  10/17/2022, 9:17 AM

## 2022-10-17 NOTE — Discharge Summary (Signed)
Physician Discharge Summary   Patient: Yesenia Carrillo MRN: [id number] DOB: November 22, 1995  Admit date:     10/10/2022  Discharge date: 10/17/22  Discharge Physician: Enedina Finner   PCP: [facility]   Recommendations at discharge:    Total oral fluid restriction to 1500 cc/24 hour F/u [facility] clinic--make your appt thru [facility] my chart--mother informed as well 3. Your Empagliflozin (jardiance) is on HOLD till you recover from your genital infection. Discuss with your [facility] cardiology as to when it can be resumed  Discharge Diagnoses: Principal Problem:   Acute on chronic respiratory failure (HCC) Active Problems:   Pulmonary hypertension, unspecified (HCC)   Herpes simplex vulvovaginitis   Acute on chronic heart failure with preserved ejection fraction (HCC)  27 years old female with PMH significant for pulmonary hypertension, HFp EF, interstitial lung disease, COPD, PE on Eliquis presented in the ED after being sent over from urgent care for hypoxia.   Acute on chronic hypoxic respiratory failure/ acute diastolic CHF, volume overload Interstitial lung disease Pulmonary artery hypertension--known history and follows [facility] clinic --At baseline she uses 8 L via nasal cannula.   --She does have interstitial lung disease of unclear etiology. She might have some form of inflammatory pneumonitis.   --CTA chest ruled out pulmonary embolism.  Procalcitonin not significantly elevated and respiratory viral panel negative.  --No need for antibiotics at this time.  Continue diuresis as per Cardiology.--torsemide 40 mg bid at discharge with fluid restriction --Steroid discontinued as per cardiology. --Heart failure team recommendation appreciated --Echocardiogram this admission showed LVEF 55 to 60%, there is an evidence of right ventricular failure on exam. --RHC showed severe PAH with severely elevated RA pressure and moderately decreased PAP. --She was continued on Lasix and  milrinone infusion.  She was given a dose of metolazone x 1. --Milrinone and Lasix infusion discontinued by Cardiology Continue digoxin and spironolactone. --She is started on sildenafil 40 mg 3 times daily, continue Tyvaso. --She reports feeling better, she is back to her baseline O2 requirement 8 L of oxygen.   Abdominal pain: --Abdominal ultrasound is normal.   History of pulmonary embolism on chronic anticoagluation --Initiated on IV heparin, now changed to Eliquis.   Morbid Obesity: --Diet and Exercise discussed in detail. --Estimated body mass index is 42.82 kg/m as calculated from the following:   Height as of this encounter: 5\' 1"  (1.549 m).   Weight as of this encounter: 102.8 kg.  --Diet and exercise discussed in detail.  Hyponatremia --improving--suspected due to volume overload  Pt eager to go home D/w Alpha Gula --mom on the phone d/c plans--agreeable. Pt to f/u [facility] cardiology and pulmonary   DVT prophylaxis: Eliquis Code Status:Full code. Family Communication: Alpha Gula on the phone      Consultants: [facility] cardiology Procedures performed: RHC Disposition: Home Diet recommendation:  Discharge Diet Orders (From admission, onward)     Start     Ordered   10/17/22 0000  Diet - low sodium heart healthy        10/17/22 0932           Cardiac diet DISCHARGE MEDICATION: Allergies as of 10/17/2022       Reactions   Ampicillin Rash, Other (See Comments)   Patient and family cannot remember the specifics of the reaction.   Penicillins Hives        Medication List     STOP taking these medications    empagliflozin 10 MG Tabs tablet Commonly  known as: JARDIANCE       TAKE these medications    albuterol 108 (90 Base) MCG/ACT inhaler Commonly known as: VENTOLIN HFA Inhale 1-2 puffs into the lungs every 6 (six) hours as needed for wheezing or shortness of breath.   calcium carbonate 500 MG chewable tablet Commonly known as: TUMS - dosed in  mg elemental calcium Chew 1 tablet (200 mg of elemental calcium total) by mouth 3 (three) times daily as needed for indigestion or heartburn.   digoxin 0.125 MG tablet Commonly known as: LANOXIN Take 1 tablet (0.125 mg total) by mouth daily. Start taking on: Oct 18, 2022   Eliquis 5 MG Tabs tablet Generic drug: apixaban Take 5 mg by mouth 2 (two) times daily.   famotidine 20 MG tablet Commonly known as: PEPCID Take 20 mg by mouth 2 (two) times daily.   hydrocortisone cream 1 % Apply topically as needed for itching.   ipratropium-albuterol 0.5-2.5 (3) MG/3ML Soln Commonly known as: DUONEB Take 3 mLs by nebulization every 6 (six) hours as needed.   lidocaine 2 % jelly Commonly known as: XYLOCAINE Apply 1 Application topically daily as needed (vaginal itching).   midodrine 5 MG tablet Commonly known as: PROAMATINE Take 1 tablet (5 mg total) by mouth 3 (three) times daily with meals.   sildenafil 20 MG tablet Commonly known as: REVATIO Take 20 mg by mouth 3 (three) times daily.   spironolactone 25 MG tablet Commonly known as: ALDACTONE Take 25 mg by mouth daily.   torsemide 20 MG tablet Commonly known as: DEMADEX Take 2 tablets (40 mg total) by mouth 2 (two) times daily.   Trelegy Ellipta 200-62.5-25 MCG/ACT Aepb Generic drug: Fluticasone-Umeclidin-Vilant Inhale 1 puff into the lungs daily.   Tyvaso Refill 0.6 MG/ML Soln Generic drug: Treprostinil Inhale 18 mcg into the lungs every 4 (four) hours while awake.        Follow-up Information     [facility], [facility]. Schedule an appointment as soon as possible for a visit in 1 week(s).   Specialty: General Practice Why: hospital f/u Contact information: [address] [place] [address] [phone]                Discharge Exam: Filed Weights   10/15/22 0238 10/16/22 0500 10/17/22 0500  Weight: 104.8 kg 102.8 kg 99.9 kg  Physical Exam Constitutional:       Appearance: She is well-developed. She is obese.  Cardiovascular:     Rate and Rhythm: Normal rate and regular rhythm.     Pulses: Normal pulses.  Pulmonary:     Effort: Pulmonary effort is normal.     Comments: Decreased bs bases Musculoskeletal:        General: Normal range of motion.  Skin:    General: Skin is warm and dry.  Neurological:     General: No focal deficit present.     Mental Status: She is alert and oriented to person, place, and time.       Condition at discharge: fair  The results of significant diagnostics from this hospitalization (including imaging, microbiology, ancillary and laboratory) are listed below for reference.   Imaging Studies: CARDIAC CATHETERIZATION  Result Date: 10/12/2022 1. Normal PCWP 2. Severe pulmonary arterial hypertension with PVR 12.5 WU.  3. Severely elevated RA pressure.  Predominantly RV failure. 4. PAPi low but not markedly low at 1.45. 5. Low cardiac output. I will start her on milrinone 0.125 and digoxin 0.125 daily.  Will initiate Lasix gtt at 12 mg/hr (had bolus this morning) and give dose of metolazone. Will place PICC to follow CVP and co-ox. RHC is concerning for development of severe RV failure.  PAH is thought to be predominantly group 3 from underlying lung disease, workup has been at [facility].  She has Tyvaso and sildenafil.  With normal PCWP, will increase sildenafil.  Long-term, suspect she would not be a good candidate for IV pulmonary vasodilators as thought to be predominantly group 3.   [place] EKG SITE RITE  Result Date: 10/12/2022 If Site Rite image not attached, placement could not be confirmed due to current cardiac rhythm.  ECHOCARDIOGRAM COMPLETE  Result Date: 10/11/2022    ECHOCARDIOGRAM REPORT   Patient Name:   Yesenia Carrillo Date of Exam: 10/11/2022 Medical Rec #:  [id number]    Height:       61.0 in Accession #:    [id number]   Weight:       233.7 lb Date of Birth:  14-Jun-1995    BSA:          2.018 m Patient Age:    26 years      BP:           103/61 mmHg Patient Gender: F            HR:           105 bpm. Exam Location:  [facility] Procedure: 2D Echo, Cardiac Doppler and Color Doppler Indications:     Pericardial Effusion I31.3  History:         Patient has prior history of Echocardiogram examinations, most                  recent 05/26/2022. CHF; Risk Factors:Hypertension.  Sonographer:     Cristela Blue Referring Phys:  [id number] BRITTON L R[place] Diagnosing Phys: Freida Busman McleanMD IMPRESSIONS  1. Left ventricular ejection fraction, by estimation, is 55 to 60%. The left ventricle has normal function. The left ventricle has no regional wall motion abnormalities. There is mild concentric left ventricular hypertrophy. Left ventricular diastolic parameters are indeterminate.  2. D-shaped septum suggestive of RV pressure/volume overload. IVC was not visualized. Peak RV-RA gradient 54 mmHg. Right ventricular systolic function is severely reduced. The right ventricular size is moderately enlarged.  3. Right atrial size was moderately dilated.  4. The mitral valve is normal in structure. No evidence of mitral valve regurgitation. No evidence of mitral stenosis.  5. The aortic valve is tricuspid. Aortic valve regurgitation is not visualized. No aortic stenosis is present.  6. A small pericardial effusion is present. The pericardial effusion is circumferential. FINDINGS  Left Ventricle: Left ventricular ejection fraction, by estimation, is 55 to 60%. The left ventricle has normal function. The left ventricle has no regional wall motion abnormalities. The left ventricular internal cavity size was small. There is mild concentric left ventricular hypertrophy. Left ventricular diastolic parameters are indeterminate. Right Ventricle: D-shaped septum suggestive of RV pressure/volume overload. IVC was not visualized. Peak RV-RA gradient 54 mmHg. The right ventricular size is moderately enlarged. No increase in right ventricular wall thickness. Right ventricular  systolic function is severely reduced. Left Atrium: Left atrial size was normal in size. Right Atrium: Right atrial size was moderately dilated. Pericardium: A small pericardial effusion is present. The pericardial effusion is circumferential. Mitral Valve: The mitral valve is normal in structure. No evidence of mitral valve regurgitation. No evidence of mitral valve stenosis. MV peak gradient, 2.7 mmHg. The mean  mitral valve gradient is 2.0 mmHg. Tricuspid Valve: The tricuspid valve is normal in structure. Tricuspid valve regurgitation is mild. Aortic Valve: The aortic valve is tricuspid. Aortic valve regurgitation is not visualized. No aortic stenosis is present. Aortic valve mean gradient measures 1.0 mmHg. Aortic valve peak gradient measures 2.2 mmHg. Aortic valve area, by VTI measures 3.34 cm. Pulmonic Valve: The pulmonic valve was normal in structure. Pulmonic valve regurgitation is trivial. Aorta: The aortic root is normal in size and structure. Venous: The inferior vena cava was not well visualized. IAS/Shunts: No atrial level shunt detected by color flow Doppler.  LEFT VENTRICLE PLAX 2D LVIDd:         3.10 cm   Diastology LVIDs:         2.20 cm   LV e' medial:    7.94 cm/s LV PW:         1.20 cm   LV E/e' medial:  8.4 LV IVS:        1.20 cm   LV e' lateral:   10.00 cm/s LVOT diam:     2.00 cm   LV E/e' lateral: 6.7 LV SV:         31 LV SV Index:   16 LVOT Area:     3.14 cm  RIGHT VENTRICLE RV Basal diam:  4.40 cm RV Mid diam:    4.30 cm RV S prime:     9.79 cm/s TAPSE (M-mode): 1.2 cm LEFT ATRIUM           Index       RIGHT ATRIUM           Index LA diam:      2.70 cm 1.34 cm/m  RA Area:     26.10 cm LA Vol (A4C): 19.6 ml 9.71 ml/m  RA Volume:   90.60 ml  44.90 ml/m  AORTIC VALVE AV Area (Vmax):    2.46 cm AV Area (Vmean):   2.22 cm AV Area (VTI):     3.34 cm AV Vmax:           74.60 cm/s AV Vmean:          54.300 cm/s AV VTI:            0.094 m AV Peak Grad:      2.2 mmHg AV Mean Grad:      1.0  mmHg LVOT Vmax:         58.30 cm/s LVOT Vmean:        38.400 cm/s LVOT VTI:          0.100 m LVOT/AV VTI ratio: 1.06  AORTA Ao Root diam: 2.60 cm MITRAL VALVE               TRICUSPID VALVE MV Area (PHT): 10.25 cm   TR Peak grad:   53.6 mmHg MV Area VTI:   2.34 cm    TR Vmax:        366.00 cm/s MV Peak grad:  2.7 mmHg MV Mean grad:  2.0 mmHg    SHUNTS MV Vmax:       0.81 m/s    Systemic VTI:  0.10 m MV Vmean:      58.9 cm/s   Systemic Diam: 2.00 cm MV Decel Time: 74 msec MV E velocity: 66.60 cm/s MV A velocity: 59.20 cm/s MV E/A ratio:  1.13 Dalton McleanMD Electronically signed by Wilfred Lacy Signature Date/Time: 10/11/2022/1:31:26 PM    Final    US ABDOMEN LIMITED  RUQ (LIVER/GB)  Result Date: 10/11/2022 CLINICAL DATA:  Right upper quadrant abdominal pain EXAM: ULTRASOUND ABDOMEN LIMITED RIGHT UPPER QUADRANT COMPARISON:  CT 10/10/2022 FINDINGS: Gallbladder: No gallstones or wall thickening visualized. No sonographic Murphy sign noted by sonographer. Common bile duct: Diameter: 3 mm in proximal diameter Liver: No focal lesion identified. Within normal limits in parenchymal echogenicity. Portal vein is patent on color Doppler imaging with normal direction of blood flow towards the liver. Other: None. IMPRESSION: 1. Normal right upper quadrant sonogram. No evidence of acute cholecystitis. Electronically Signed   By: Helyn Numbers M.D.   On: 10/11/2022 01:27   CT ABDOMEN PELVIS W CONTRAST  Result Date: 10/10/2022 CLINICAL DATA:  Acute abdominal pain. EXAM: CT ABDOMEN AND PELVIS WITH CONTRAST TECHNIQUE: Multidetector CT imaging of the abdomen and pelvis was performed using the standard protocol following bolus administration of intravenous contrast. RADIATION DOSE REDUCTION: This exam was performed according to the departmental dose-optimization program which includes automated exposure control, adjustment of the mA and/or kV according to patient size and/or use of iterative reconstruction technique. CONTRAST:   OMNIPAQUE IOHEXOL 350 MG/ML SOLN COMPARISON:  CT abdomen and pelvis 09/27/2022 FINDINGS: Lower chest: There are ground-glass opacities in the lung bases. Hepatobiliary: There is mild inflammation surrounding the gallbladder best seen on coronal imaging. There is no biliary ductal dilatation. There is fatty infiltration of the liver. Pancreas: Unremarkable. No pancreatic ductal dilatation or surrounding inflammatory changes. Spleen: Small in size, unchanged. Adrenals/Urinary Tract: Adrenal glands are unremarkable. Kidneys are normal, without renal calculi, focal lesion, or hydronephrosis. Bladder is unremarkable. Stomach/Bowel: There is a small rounded hyperdense area in the distal esophagus measuring 8 mm which may represent pill fragment. Stomach is within normal limits. Appendix appears normal. No evidence of bowel wall thickening, distention, or inflammatory changes. Vascular/Lymphatic: Nonenlarged gastrohepatic and portacaval lymph nodes are again seen. Aorta and IVC are normal in size. Reproductive: Uterus and bilateral adnexa are unremarkable. Other: Central mesenteric haziness is present, but has decreased. No ascites or focal abdominal wall hernia. There is scarring in the left inguinal region, unchanged. There is some skin thickening of the lower anterior abdominal wall. This is new from prior. Musculoskeletal: No acute or significant osseous findings. IMPRESSION: 1. Mild inflammation surrounding the gallbladder worrisome for acute cholecystitis. Recommend further evaluation with ultrasound. 2. Fatty infiltration of the liver. 3. Central mesenteric haziness has decreased. 4. New skin thickening of the lower anterior abdominal wall. Correlate clinically for cellulitis. 5. Ground-glass opacities in the lung bases, likely infectious/inflammatory. Electronically Signed   By: Darliss Cheney M.D.   On: 10/10/2022 23:37   CT Angio Chest PE W and/or Wo Contrast  Result Date: 10/10/2022 CLINICAL DATA:  High  probability for PE EXAM: CT ANGIOGRAPHY CHEST WITH CONTRAST TECHNIQUE: Multidetector CT imaging of the chest was performed using the standard protocol during bolus administration of intravenous contrast. Multiplanar CT image reconstructions and MIPs were obtained to evaluate the vascular anatomy. RADIATION DOSE REDUCTION: This exam was performed according to the departmental dose-optimization program which includes automated exposure control, adjustment of the mA and/or kV according to patient size and/or use of iterative reconstruction technique. CONTRAST:  OMNIPAQUE IOHEXOL 350 MG/ML SOLN COMPARISON:  CT angiogram chest 09/27/2022 FINDINGS: Cardiovascular: Aorta is normal in size. Heart is enlarged, unchanged. There is a small to moderate-sized pericardial effusion similar to prior study. There is adequate opacification of the pulmonary arteries to the segmental level. No pulmonary embolism identified. Main pulmonary artery is enlarged compatible with  pulmonary artery hypertension similar to the prior study. Mediastinum/Nodes: There are enlarged and nonenlarged prevascular lymph nodes measuring up to 18 mm short axis similar to the prior study. Visualized esophagus and thyroid gland are within normal limits. Lungs/Pleura: Patchy ground-glass opacities are seen throughout both lungs, mildly increased from prior. There is no lung consolidation, pleural effusion or pneumothorax. Upper Abdomen: No acute abnormality. Musculoskeletal: No chest wall abnormality. No acute or significant osseous findings. Review of the MIP images confirms the above findings. IMPRESSION: 1. No evidence for pulmonary embolism. 2. Stable cardiomegaly with small to moderate-sized pericardial effusion. 3. Stable pulmonary artery hypertension. 4. Patchy ground-glass opacities throughout both lungs, mildly increased from prior. Findings are favored as infectious/inflammatory. 5. Stable mediastinal lymphadenopathy. Electronically Signed    By: Darliss Cheney M.D.   On: 10/10/2022 23:31   DG Chest Portable 1 View  Result Date: 10/10/2022 CLINICAL DATA:  Hypoxia EXAM: PORTABLE CHEST 1 VIEW COMPARISON:  Chest x-ray 09/26/2022 FINDINGS: Cardiac silhouette is enlarged, unchanged. The lungs are clear. There is no pleural effusion or pneumothorax. No fractures are seen. IMPRESSION: Cardiomegaly. No acute pulmonary process. Electronically Signed   By: Darliss Cheney M.D.   On: 10/10/2022 20:11   CT ABDOMEN PELVIS W CONTRAST  Result Date: 09/27/2022 CLINICAL DATA:  interstitial lung disease with chronic hypoxic respiratory failure on 8 to 9 L of home oxygen, HFpEF, pulmonary hypertension, pulmonary embolism on Eliquis. Abdominal pain. EXAM: CT ANGIOGRAPHY CHEST CT ABDOMEN AND PELVIS WITH CONTRAST TECHNIQUE: Multidetector CT imaging of the chest was performed using the standard protocol during bolus administration of intravenous contrast. Multiplanar CT image reconstructions and MIPs were obtained to evaluate the vascular anatomy. Multidetector CT imaging of the abdomen and pelvis was performed using the standard protocol during bolus administration of intravenous contrast. RADIATION DOSE REDUCTION: This exam was performed according to the departmental dose-optimization program which includes automated exposure control, adjustment of the mA and/or kV according to patient size and/or use of iterative reconstruction technique. CONTRAST:  OMNIPAQUE IOHEXOL 350 MG/ML SOLN COMPARISON:  Chest CTA 08/09/2022. Abdomen and pelvis CTA 11/04/2019. FINDINGS: CTA CHEST FINDINGS Cardiovascular: The heart is enlarged. Tiny pericardial effusion evident. No thoracic aortic aneurysm. Enlargement of the pulmonary outflow tract/main pulmonary arteries suggests pulmonary arterial hypertension. There is no filling defect within the opacified pulmonary arteries to suggest the presence of an acute pulmonary embolus. Reflux of contrast material into the IVC and hepatic veins  is consistent with right heart dysfunction. Mediastinum/Nodes: Mediastinal lymphadenopathy again noted. Index prevascular node measured previously at 11 mm short axis is 14 mm short axis today on image 40/4. 11 mm short axis precarinal node visible on 41/4. There is no hilar lymphadenopathy. The esophagus has normal imaging features. There is no axillary lymphadenopathy. Lungs/Pleura: Similar appearance of diffuse ground-glass opacity in both lungs. No evidence for superimposed dense consolidative airspace disease. No suspicious pulmonary nodule or mass. No pneumothorax. No pleural effusion. Musculoskeletal: No worrisome lytic or sclerotic osseous abnormality. Review of the MIP images confirms the above findings. CT ABDOMEN and PELVIS FINDINGS Hepatobiliary: Liver measures 17.6 cm craniocaudal length, enlarged. No suspicious focal abnormality within the liver parenchyma. There is diffuse gallbladder wall thickening and edema. No evidence for calcified gallstones. No intrahepatic or extrahepatic biliary dilation. Pancreas: No focal mass lesion. No dilatation of the main duct. No intraparenchymal cyst. No peripancreatic edema. 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 Generally, splenic  tissue hypo enhances. Adrenals/Urinary Tract: No adrenal nodule or mass. Kidneys unremarkable. No evidence for hydroureter. The urinary bladder appears normal for the degree of distention. Stomach/Bowel: Stomach is unremarkable. No gastric wall thickening. No evidence of outlet obstruction. Duodenum is normally positioned as is the ligament of Treitz. No small bowel wall thickening. No small bowel dilatation. Interval development of haziness/edema in the central small bowel mesentery with increased number of normal and upper normal central mesenteric lymph nodes. The terminal ileum is normal. The appendix is normal. No gross colonic mass. No  colonic wall thickening. Vascular/Lymphatic: No abdominal aortic aneurysm. No abdominal aortic atherosclerotic calcification. Borderline enlarged lymph nodes are seen in the hepatoduodenal ligament. No retroperitoneal lymphadenopathy. Increased number of central mesenteric lymph nodes evident (see above). No pelvic sidewall lymphadenopathy. Reproductive: Unremarkable. Other: No intraperitoneal free fluid. As above, there is new hazy soft tissue density/edema in the central small bowel mesentery the tracks down along the right psoas muscle into the right pelvic sidewall. Musculoskeletal: No worrisome lytic or sclerotic osseous abnormality. Review of the MIP images confirms the above findings. IMPRESSION: 1. No CT evidence for acute pulmonary embolus. 2. Enlargement of the pulmonary outflow tract/main pulmonary arteries suggests pulmonary arterial hypertension. Reflux of contrast material into the IVC and hepatic veins is consistent with right heart dysfunction. 3. Cardiomegaly with tiny pericardial effusion. 4. Similar appearance of diffuse ground-glass opacity in both lungs. Imaging features are nonspecific and may be related to pulmonary edema, chronic interstitial lung disease or atypical infection. No pleural effusion. 5. Persistent mild mediastinal lymphadenopathy, likely reactive. 6. Interval development of haziness/edema in the central small bowel mesentery with increased number of normal and upper normal central mesenteric lymph nodes. This finding may be related to infectious/inflammatory etiology including enteritis or mesenteric adenitis. These changes tracks inferiorly along the right psoas muscle down to the right pelvic sidewall. No secondary changes in the right kidney or ureter to suggest urinary source for this appearance. 7. 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 8. Hepatomegaly. Electronically  Signed   By: Kennith Center M.D.   On: 09/27/2022 05:17   CT Angio Chest Pulmonary Embolism (PE) W or WO Contrast  Result Date: 09/27/2022 CLINICAL DATA:  interstitial lung disease with chronic hypoxic respiratory failure on 8 to 9 L of home oxygen, HFpEF, pulmonary hypertension, pulmonary embolism on Eliquis. Abdominal pain. EXAM: CT ANGIOGRAPHY CHEST CT ABDOMEN AND PELVIS WITH CONTRAST TECHNIQUE: Multidetector CT imaging of the chest was performed using the standard protocol during bolus administration of intravenous contrast. Multiplanar CT image reconstructions and MIPs were obtained to evaluate the vascular anatomy. Multidetector CT imaging of the abdomen and pelvis was performed using the standard protocol during bolus administration of intravenous contrast. RADIATION DOSE REDUCTION: This exam was performed according to the departmental dose-optimization program which includes automated exposure control, adjustment of the mA and/or kV according to patient size and/or use of iterative reconstruction technique. CONTRAST:  OMNIPAQUE IOHEXOL 350 MG/ML SOLN COMPARISON:  Chest CTA 08/09/2022. Abdomen and pelvis CTA 11/04/2019. FINDINGS: CTA CHEST FINDINGS Cardiovascular: The heart is enlarged. Tiny pericardial effusion evident. No thoracic aortic aneurysm. Enlargement of the pulmonary outflow tract/main pulmonary arteries suggests pulmonary arterial hypertension. There is no filling defect within the opacified pulmonary arteries to suggest the presence of an acute pulmonary embolus. Reflux of contrast material into the IVC and hepatic veins is consistent with right heart dysfunction. Mediastinum/Nodes: Mediastinal  lymphadenopathy again noted. Index prevascular node measured previously at 11 mm short axis is 14 mm short axis today on image 40/4. 11 mm short axis precarinal node visible on 41/4. There is no hilar lymphadenopathy. The esophagus has normal imaging features. There is no axillary lymphadenopathy.  Lungs/Pleura: Similar appearance of diffuse ground-glass opacity in both lungs. No evidence for superimposed dense consolidative airspace disease. No suspicious pulmonary nodule or mass. No pneumothorax. No pleural effusion. Musculoskeletal: No worrisome lytic or sclerotic osseous abnormality. Review of the MIP images confirms the above findings. CT ABDOMEN and PELVIS FINDINGS Hepatobiliary: Liver measures 17.6 cm craniocaudal length, enlarged. No suspicious focal abnormality within the liver parenchyma. There is diffuse gallbladder wall thickening and edema. No evidence for calcified gallstones. No intrahepatic or extrahepatic biliary dilation. Pancreas: No focal mass lesion. No dilatation of the main duct. No intraparenchymal cyst. No peripancreatic edema. 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 Generally, splenic tissue hypo enhances. Adrenals/Urinary Tract: No adrenal nodule or mass. Kidneys unremarkable. No evidence for hydroureter. The urinary bladder appears normal for the degree of distention. Stomach/Bowel: Stomach is unremarkable. No gastric wall thickening. No evidence of outlet obstruction. Duodenum is normally positioned as is the ligament of Treitz. No small bowel wall thickening. No small bowel dilatation. Interval development of haziness/edema in the central small bowel mesentery with increased number of normal and upper normal central mesenteric lymph nodes. The terminal ileum is normal. The appendix is normal. No gross colonic mass. No colonic wall thickening. Vascular/Lymphatic: No abdominal aortic aneurysm. No abdominal aortic atherosclerotic calcification. Borderline enlarged lymph nodes are seen in the hepatoduodenal ligament. No retroperitoneal lymphadenopathy. Increased number of central mesenteric lymph nodes evident (see above). No pelvic sidewall lymphadenopathy. Reproductive: Unremarkable. Other:  No intraperitoneal free fluid. As above, there is new hazy soft tissue density/edema in the central small bowel mesentery the tracks down along the right psoas muscle into the right pelvic sidewall. Musculoskeletal: No worrisome lytic or sclerotic osseous abnormality. Review of the MIP images confirms the above findings. IMPRESSION: 1. No CT evidence for acute pulmonary embolus. 2. Enlargement of the pulmonary outflow tract/main pulmonary arteries suggests pulmonary arterial hypertension. Reflux of contrast material into the IVC and hepatic veins is consistent with right heart dysfunction. 3. Cardiomegaly with tiny pericardial effusion. 4. Similar appearance of diffuse ground-glass opacity in both lungs. Imaging features are nonspecific and may be related to pulmonary edema, chronic interstitial lung disease or atypical infection. No pleural effusion. 5. Persistent mild mediastinal lymphadenopathy, likely reactive. 6. Interval development of haziness/edema in the central small bowel mesentery with increased number of normal and upper normal central mesenteric lymph nodes. This finding may be related to infectious/inflammatory etiology including enteritis or mesenteric adenitis. These changes tracks inferiorly along the right psoas muscle down to the right pelvic sidewall. No secondary changes in the right kidney or ureter to suggest urinary source for this appearance. 7. 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 8. Hepatomegaly. Electronically Signed   By: Kennith Center M.D.   On: 09/27/2022 05:17   DG Chest Port 1 View  Result Date: 09/26/2022 CLINICAL DATA:  Shortness of breath. EXAM: PORTABLE CHEST 1 VIEW COMPARISON:  August 09, 2022 FINDINGS: The cardiac silhouette is mildly enlarged and unchanged in size. Low lung volumes are noted. Mild atelectasis and/or infiltrate is seen within the  right lung base. There is  no evidence of a pleural effusion or pneumothorax. The visualized skeletal structures are unremarkable. IMPRESSION: Low lung volumes with mild right basilar atelectasis and/or infiltrate. Electronically Signed   By: Aram Candela M.D.   On: 09/26/2022 21:16    Microbiology: Results for orders placed or performed during the hospital encounter of 10/10/22  SARS Coronavirus 2 by RT PCR (hospital order, performed in [facility] hospital lab) *cepheid single result test*     Status: None   Collection Time: 10/10/22  7:26 PM  Result Value Ref Range Status   SARS Coronavirus 2 by RT PCR NEGATIVE NEGATIVE Final    Comment: (NOTE) SARS-CoV-2 target nucleic acids are NOT DETECTED.  The SARS-CoV-2 RNA is generally detectable in upper and lower respiratory specimens during the acute phase of infection. The lowest concentration of SARS-CoV-2 viral copies this assay can detect is 250 copies / mL. A negative result does not preclude SARS-CoV-2 infection and should not be used as the sole basis for treatment or other patient management decisions.  A negative result may occur with improper specimen collection / handling, submission of specimen other than nasopharyngeal swab, presence of viral mutation(s) within the areas targeted by this assay, and inadequate number of viral copies (<250 copies / mL). A negative result must be combined with clinical observations, patient history, and epidemiological information.  Fact Sheet for Patients:   RoadLapTop.co.za  Fact Sheet for Healthcare Providers: http://kim-miller.com/  This test is not yet approved or  cleared by the [place] FDA and has been authorized for detection and/or diagnosis of SARS-CoV-2 by FDA under an Emergency Use Authorization (EUA).  This EUA will remain in effect (meaning this test can be used) for the duration of the COVID-19 declaration under Section 564(b)(1) of the Act, 21  U.S.C. section 360bbb-3(b)(1), unless the authorization is terminated or revoked sooner.  Performed at [facility], [address] [place], [place] [address]   Blood culture (routine x 2)     Status: None   Collection Time: 10/11/22 12:25 AM   Specimen: BLOOD  Result Value Ref Range Status   Specimen Description BLOOD LEFT [facility]  Final   Special Requests   Final    BOTTLES DRAWN AEROBIC AND ANAEROBIC Blood Culture adequate volume   Culture   Final    NO GROWTH 5 DAYS Performed at [facility], [address], [place], [place] [address]    Report Status 10/16/2022 FINAL  Final  MRSA Next Gen by PCR, Nasal     Status: None   Collection Time: 10/11/22  2:52 AM  Result Value Ref Range Status   MRSA by PCR Next Gen NOT DETECTED NOT DETECTED Final    Comment: (NOTE) The GeneXpert MRSA Assay (FDA approved for NASAL specimens only), is one component of a comprehensive MRSA colonization surveillance program. It is not intended to diagnose MRSA infection nor to guide or monitor treatment for MRSA infections. Test performance is not FDA approved in patients less than 48 years old. Performed at [facility], [address] [place], [place] [address]   Respiratory (~20 pathogens) panel by PCR     Status: None   Collection Time: 10/11/22  2:54 AM   Specimen: Nasopharyngeal Swab; Respiratory  Result Value Ref Range Status   Adenovirus NOT DETECTED NOT DETECTED Final   Coronavirus 229E NOT DETECTED NOT DETECTED Final    Comment: (NOTE) The Coronavirus on the Respiratory Panel, DOES NOT test for  the novel  Coronavirus (2019 nCoV)    Coronavirus HKU1 NOT DETECTED NOT DETECTED Final   Coronavirus NL63 NOT DETECTED NOT DETECTED Final   Coronavirus OC43 NOT DETECTED NOT DETECTED Final   Metapneumovirus NOT DETECTED NOT DETECTED Final   Rhinovirus / Enterovirus NOT DETECTED NOT DETECTED Final   Influenza A NOT DETECTED NOT DETECTED Final   Influenza B NOT  DETECTED NOT DETECTED Final   Parainfluenza Virus 1 NOT DETECTED NOT DETECTED Final   Parainfluenza Virus 2 NOT DETECTED NOT DETECTED Final   Parainfluenza Virus 3 NOT DETECTED NOT DETECTED Final   Parainfluenza Virus 4 NOT DETECTED NOT DETECTED Final   Respiratory Syncytial Virus NOT DETECTED NOT DETECTED Final   Bordetella pertussis NOT DETECTED NOT DETECTED Final   Bordetella Parapertussis NOT DETECTED NOT DETECTED Final   Chlamydophila pneumoniae NOT DETECTED NOT DETECTED Final   Mycoplasma pneumoniae NOT DETECTED NOT DETECTED Final    Comment: Performed at [facility], [address], [place], [place] [address]  Wet prep, genital     Status: Abnormal   Collection Time: 10/11/22  3:33 AM  Result Value Ref Range Status   Yeast Wet Prep HPF POC NONE SEEN NONE SEEN Final   Trich, Wet Prep NONE SEEN NONE SEEN Final   Clue Cells Wet Prep HPF POC NONE SEEN NONE SEEN Final   WBC, Wet Prep HPF POC NONE SEEN (A) <10 Final   Sperm NONE SEEN  Final    Comment: Performed at [facility], [address] [place], [place] [address]  Chlamydia/NGC rt PCR [facility] only)     Status: None   Collection Time: 10/11/22  4:36 AM  Result Value Ref Range Status   Specimen source GC/Chlam ENDOCERVICAL  Final   Chlamydia Tr NOT DETECTED NOT DETECTED Final   N gonorrhoeae NOT DETECTED NOT DETECTED Final    Comment: (NOTE) This CT/NG assay has not been evaluated in patients with a history of  hysterectomy. Performed at [facility], [address] [place], [place] [address]   Aerobic/Anaerobic Culture w Gram Stain (surgical/deep wound)     Status: Abnormal   Collection Time: 10/11/22  8:38 AM   Specimen: Vaginal  Result Value Ref Range Status   Specimen Description   Final    VAGINA Performed at [facility], [address], [place], [place] [address]    Special Requests   Final    NONE Performed at [facility], [address] [place], [place]  [address]    Gram Stain   Final    NO WBC SEEN ABUNDANT GRAM NEGATIVE RODS FEW GRAM POSITIVE COCCI IN PAIRS    Culture (A)  Final    MULTIPLE ORGANISMS PRESENT, NONE PREDOMINANT RARE PREVOTELLA DISIENS BETA LACTAMASE POSITIVE Performed at [facility], [address] [place], [place] [address]    Report Status 10/16/2022 FINAL  Final    Labs: CBC: Recent Labs  Lab 10/10/22 1947 10/11/22 0517 10/12/22 0603 10/12/22 1006 10/12/22 1011 10/13/22 0617 10/15/22 0530 10/16/22 0353 10/17/22 0446  WBC 15.6*   < > 15.4*  --   --  21.1* 13.6* 14.1* 12.6*  NEUTROABS 9.8*  --  13.1*  --   --   --   --   --   --   HGB 15.7*   < > 15.5*   < > 17.0* 15.6* 17.9* 18.2* 18.5*  HCT 45.3   < > 43.5   < > 50.0*  44.4 50.6* 52.0* 53.5*  MCV 78.4*   < > 76.9*  --   --  76.9* 76.9* 76.4* 77.8*  PLT 334   < > 308  --   --  331 349 324 346   < > = values in this interval not displayed.   Basic Metabolic Panel: Recent Labs  Lab 10/12/22 1713 10/13/22 0617 10/14/22 0430 10/15/22 0530 10/16/22 0353 10/17/22 0446  NA 135 134* 134* 131* 126* 132*  K 4.3 3.7 4.2 3.7 3.6 4.4  CL 96* 93* 91* 88* 86* 93*  CO2 27 30 31 31 27 29   GLUCOSE 123* 103* 102* 197* 277* 90  BUN 20 22* 28* 29* 33* 40*  CREATININE 1.09* 1.01* 1.13* 1.10* 1.28* 1.62*  CALCIUM 9.8 9.6 9.9 9.7 9.4 9.6  MG 2.6* 2.7*  --  2.7* 2.7* 3.1*  PHOS 4.1 4.4  --  5.9* 5.2* 6.4*   Liver Function Tests: Recent Labs  Lab 10/10/22 2047 10/12/22 0603  AST 29 34  ALT 18 18  ALKPHOS 109 121  BILITOT 5.8* 4.2*  PROT 7.3 7.9  ALBUMIN 3.6 4.0   CBG: Recent Labs  Lab 10/11/22 0236  GLUCAP 122*    Discharge time spent: greater than 30 minutes.  Signed: Enedina Finner, MD [facility] 10/17/2022

## 2022-10-17 NOTE — Plan of Care (Signed)

## 2022-10-26 ENCOUNTER — Encounter: Payer: Medicaid Other | Admitting: Obstetrics and Gynecology

## 2022-10-26 DIAGNOSIS — N926 Irregular menstruation, unspecified: Secondary | ICD-10-CM

## 2022-10-26 DIAGNOSIS — Z7689 Persons encountering health services in other specified circumstances: Secondary | ICD-10-CM

## 2022-10-26 DIAGNOSIS — N912 Amenorrhea, unspecified: Secondary | ICD-10-CM

## 2022-11-09 ENCOUNTER — Other Ambulatory Visit: Payer: Self-pay

## 2022-11-09 ENCOUNTER — Inpatient Hospital Stay
Admission: EM | Admit: 2022-11-09 | Discharge: 2022-11-19 | DRG: 291 | Disposition: A | Discharge status: Home / Self Care | Payer: Medicaid Other | Attending: Internal Medicine | Admitting: Internal Medicine

## 2022-11-09 DIAGNOSIS — I5033 Acute on chronic diastolic (congestive) heart failure: Principal | ICD-10-CM | POA: Diagnosis present

## 2022-11-09 DIAGNOSIS — F17210 Nicotine dependence, cigarettes, uncomplicated: Secondary | ICD-10-CM | POA: Diagnosis present

## 2022-11-09 DIAGNOSIS — R17 Unspecified jaundice: Secondary | ICD-10-CM | POA: Diagnosis present

## 2022-11-09 DIAGNOSIS — I2722 Pulmonary hypertension due to left heart disease: Secondary | ICD-10-CM | POA: Diagnosis present

## 2022-11-09 DIAGNOSIS — Z91148 Patient's other noncompliance with medication regimen for other reason: Secondary | ICD-10-CM

## 2022-11-09 DIAGNOSIS — R040 Epistaxis: Secondary | ICD-10-CM | POA: Diagnosis present

## 2022-11-09 DIAGNOSIS — D573 Sickle-cell trait: Secondary | ICD-10-CM | POA: Diagnosis present

## 2022-11-09 DIAGNOSIS — Z880 Allergy status to penicillin: Secondary | ICD-10-CM

## 2022-11-09 DIAGNOSIS — Z6841 Body Mass Index (BMI) 40.0 and over, adult: Secondary | ICD-10-CM

## 2022-11-09 DIAGNOSIS — Z8616 Personal history of COVID-19: Secondary | ICD-10-CM

## 2022-11-09 DIAGNOSIS — B9789 Other viral agents as the cause of diseases classified elsewhere: Secondary | ICD-10-CM | POA: Diagnosis present

## 2022-11-09 DIAGNOSIS — I50810 Right heart failure, unspecified: Secondary | ICD-10-CM | POA: Diagnosis present

## 2022-11-09 DIAGNOSIS — G4733 Obstructive sleep apnea (adult) (pediatric): Secondary | ICD-10-CM | POA: Diagnosis present

## 2022-11-09 DIAGNOSIS — I2489 Other forms of acute ischemic heart disease: Secondary | ICD-10-CM | POA: Diagnosis present

## 2022-11-09 DIAGNOSIS — Z9981 Dependence on supplemental oxygen: Secondary | ICD-10-CM

## 2022-11-09 DIAGNOSIS — J9621 Acute and chronic respiratory failure with hypoxia: Secondary | ICD-10-CM | POA: Diagnosis present

## 2022-11-09 DIAGNOSIS — R0603 Acute respiratory distress: Secondary | ICD-10-CM

## 2022-11-09 DIAGNOSIS — I2723 Pulmonary hypertension due to lung diseases and hypoxia: Secondary | ICD-10-CM | POA: Diagnosis present

## 2022-11-09 DIAGNOSIS — Z1152 Encounter for screening for COVID-19: Secondary | ICD-10-CM

## 2022-11-09 DIAGNOSIS — Z86711 Personal history of pulmonary embolism: Secondary | ICD-10-CM

## 2022-11-09 DIAGNOSIS — Z79899 Other long term (current) drug therapy: Secondary | ICD-10-CM

## 2022-11-09 DIAGNOSIS — E876 Hypokalemia: Secondary | ICD-10-CM

## 2022-11-09 DIAGNOSIS — Z91119 Patient's noncompliance with dietary regimen due to unspecified reason: Secondary | ICD-10-CM

## 2022-11-09 DIAGNOSIS — Z7901 Long term (current) use of anticoagulants: Secondary | ICD-10-CM

## 2022-11-09 DIAGNOSIS — R0902 Hypoxemia: Secondary | ICD-10-CM

## 2022-11-09 DIAGNOSIS — Z7984 Long term (current) use of oral hypoglycemic drugs: Secondary | ICD-10-CM

## 2022-11-09 DIAGNOSIS — Z86718 Personal history of other venous thrombosis and embolism: Secondary | ICD-10-CM

## 2022-11-09 DIAGNOSIS — J849 Interstitial pulmonary disease, unspecified: Secondary | ICD-10-CM

## 2022-11-09 DIAGNOSIS — Z7951 Long term (current) use of inhaled steroids: Secondary | ICD-10-CM

## 2022-11-09 DIAGNOSIS — R112 Nausea with vomiting, unspecified: Secondary | ICD-10-CM | POA: Diagnosis present

## 2022-11-09 DIAGNOSIS — D75839 Thrombocytosis, unspecified: Secondary | ICD-10-CM | POA: Diagnosis present

## 2022-11-09 DIAGNOSIS — N170 Acute kidney failure with tubular necrosis: Secondary | ICD-10-CM | POA: Diagnosis present

## 2022-11-09 DIAGNOSIS — I5082 Biventricular heart failure: Secondary | ICD-10-CM | POA: Diagnosis present

## 2022-11-09 DIAGNOSIS — R7989 Other specified abnormal findings of blood chemistry: Secondary | ICD-10-CM | POA: Diagnosis present

## 2022-11-09 MED ORDER — IPRATROPIUM-ALBUTEROL 0.5-2.5 (3) MG/3ML IN SOLN
3.0000 mL | Freq: Once | RESPIRATORY_TRACT | Status: AC
  Administered 2022-11-10: 3 mL via RESPIRATORY_TRACT
  Filled 2022-11-09: qty 3

## 2022-11-09 MED ORDER — MAGNESIUM SULFATE 2 GM/50ML IV SOLN
2.0000 g | Freq: Once | INTRAVENOUS | Status: AC
  Administered 2022-11-10: 2 g via INTRAVENOUS
  Filled 2022-11-09: qty 50

## 2022-11-09 MED ORDER — METHYLPREDNISOLONE SODIUM SUCC 125 MG IJ SOLR
125.0000 mg | Freq: Once | INTRAMUSCULAR | Status: AC
  Administered 2022-11-10: 125 mg via INTRAVENOUS
  Filled 2022-11-09: qty 2

## 2022-11-09 NOTE — ED Provider Notes (Signed)
[facility] Provider Note    Event Date/Time   First MD Initiated Contact with Patient 11/09/22 2356     (approximate)   History   Shortness of Breath and Respiratory Distress (Arrived on Cpap)   HPI  Level V caveat: limited by distress  Yesenia Carrillo is a 27 y.o. female brought to the ED via EMS from home with a chief complaint of respiratory distress.  Patient with a history of interstitial lung disease normally on 8 L nasal cannula oxygen.  Reports shortness of breath x 1 day associated with dry cough.  EMS reports hypoxia with sats in the 80s on patient's nasal cannula oxygen.  Arrives to the emergency department on CPAP.  Patient denies fever/chills, chest pain, abdominal pain, nausea, vomiting or dizziness.     Past Medical History   Past Medical History:  Diagnosis Date  . CHF (congestive heart failure) (HCC)   . COVID-19 04/2019   patient reports diagnosis in Nov 2020  . Hypertension   . Interstitial lung disease (HCC)   . Morbid obesity with BMI of 40.0-44.9, adult (HCC)   . Pre-eclampsia      Active Problem List   Patient Active Problem List   Diagnosis Date Noted  . Acute cor pulmonale (HCC) 10/17/2022  . PAH (pulmonary artery hypertension) (HCC) 10/17/2022  . Acute on chronic respiratory failure (HCC) 10/11/2022  . Acute on chronic heart failure with preserved ejection fraction (HCC) 10/11/2022  . History of pulmonary embolism 09/27/2022  . Intractable nausea and vomiting 09/27/2022  . Demand ischemia 08/10/2022  . Polycythemia secondary to hypoxia 08/10/2022  . Interstitial lung disease exacerbation 08/09/2022  . Hypoglycemia 08/09/2022  . Syncope 08/09/2022  . Influenza A 05/23/2022  . Acute on chronic respiratory failure with hypoxia (HCC) 05/23/2022  . Class 3 severe obesity with body mass index (BMI) of 40.0 to 44.9 in adult [facility]) 05/23/2022  . Severe sepsis (HCC) 05/23/2022  . ILD (interstitial lung disease) (HCC)  05/23/2022  . Elevated troponin 05/23/2022  . Hypokalemia 05/23/2022  . Chronic diastolic CHF (congestive heart failure) (HCC) 05/23/2022  . Pulmonary hypertension, unspecified (HCC) 05/23/2022  . Pulmonary embolism (HCC) 05/23/2022  . BV (bacterial vaginosis) 03/12/2020  . 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 Thrombocytosis 11/13/2019  . Hx pulmonary embolism 09/10/2019  . Hypotension (arterial) 09/10/2019  . 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 Pneumonia due to COVID-19 virus 05/06/2019  . COVID-19 05/04/2019  . Pregnancy 05/04/2019  . Acute cystitis with hematuria 05/04/2019  . Sepsis due to gram-negative urinary tract infection (HCC) 11/07/2018  . Sepsis (HCC) 11/05/2018  . Morbid obesity with BMI of 50.0-59.9, adult (HCC) 05/14/2018  . Acute respiratory failure with hypoxia (HCC)   . CAP (community acquired pneumonia) 04/30/2018     Past Surgical History   Past Surgical History:  Procedure Laterality Date  . ECMO CANNULATION    . RIGHT HEART CATH N/A 10/12/2022   Procedure: RIGHT HEART CATH;  Surgeon: Laurey Morale, MD;  Location: [facility] INVASIVE CV LAB;  Service: Cardiovascular;  Laterality: N/A;  . TONSILLECTOMY    . WISDOM TOOTH EXTRACTION       Home Medications   Prior to Admission medications   Medication Sig Start Date End Date Taking? Authorizing Provider  albuterol (VENTOLIN HFA) 108 (90 Base) MCG/ACT inhaler Inhale 1-2 puffs into the lungs every 6 (six) hours as needed for wheezing or shortness of breath. 07/25/21   [provider]  calcium carbonate (TUMS -  DOSED IN MG ELEMENTAL CALCIUM) 500 MG chewable tablet Chew 1 tablet (200 mg of elemental calcium total) by mouth 3 (three) times daily as needed for indigestion or heartburn. 10/17/22   Enedina Finner, MD  digoxin (LANOXIN) 0.125 MG tablet Take 1 tablet (0.125 mg total) by mouth daily. 10/18/22   Enedina Finner, MD  ELIQUIS 5 MG TABS tablet Take 5 mg by mouth 2 (two) times daily.    [provider]  Fluticasone-Umeclidin-Vilant (TRELEGY ELLIPTA) 200-62.5-25 MCG/ACT AEPB Inhale 1 puff into the lungs daily.    [provider]  hydrocortisone cream 1 % Apply topically as needed for itching. 10/17/22   Enedina Finner, MD  ipratropium-albuterol (DUONEB) 0.5-2.5 (3) MG/3ML SOLN Take 3 mLs by nebulization every 6 (six) hours as needed. 09/29/22   Darlin Priestly, MD  lidocaine (XYLOCAINE) 2 % jelly Apply 1 Application topically daily as needed (vaginal itching). 10/17/22   Enedina Finner, MD  midodrine (PROAMATINE) 5 MG tablet Take 1 tablet (5 mg total) by mouth 3 (three) times daily with meals. 10/17/22   Enedina Finner, MD  sildenafil (REVATIO) 20 MG tablet Take 20 mg by mouth 3 (three) times daily. 04/19/22   [provider]  spironolactone (ALDACTONE) 25 MG tablet Take 25 mg by mouth daily.    [provider]  torsemide (DEMADEX) 20 MG tablet Take 2 tablets (40 mg total) by mouth 2 (two) times daily. 10/17/22 10/17/23  Enedina Finner, MD  TYVASO REFILL 0.6 MG/ML SOLN Inhale 18 mcg into the lungs every 4 (four) hours while awake. 11/30/21   [provider]     Allergies  Ampicillin and Penicillins   Family History   Family History  Problem Relation Age of Onset  . Healthy Mother   . Healthy Father      Physical Exam  Triage Vital Signs: ED Triage Vitals [11/09/22 2356]  Enc Vitals Group     BP      Pulse      Resp      Temp      Temp src      SpO2      Weight 240 lb 4.8 oz (109 kg)     Height 5\' 1"  (1.549 m)     Head Circumference      Peak Flow      Pain Score      Pain Loc      Pain Edu?      Excl. in GC?     Updated Vital Signs: Ht 5\' 1"  (1.549 m)   Wt 109 kg   LMP  (LMP Unknown)   BMI 45.40 kg/m    General: Awake, moderate distress.  CV:  Tachycardic.  Good peripheral perfusion.  Resp:  Increased effort.  Retractions.  Tripoding.  Diminished aeration bibasilarly. Abd:  Morbidly obese.  Nontender.  No distention.  Other:  1+ BLE  nonpitting edema.   ED Results / Procedures / Treatments  Labs (all labs ordered are listed, but only abnormal results are displayed) Labs Reviewed  CBC WITH DIFFERENTIAL/PLATELET - Abnormal; Notable for the following components:      Result Value   WBC 13.1 (*)    RBC 5.52 (*)    Hemoglobin 15.2 (*)    MCV 79.3 (*)    RDW 22.5 (*)    Lymphs Abs 4.1 (*)    Monocytes Absolute 1.1 (*)    All other components within normal limits  COMPREHENSIVE METABOLIC PANEL - Abnormal; Notable for the  following components:   Potassium 3.0 (*)    Creatinine, Ser 1.19 (*)    Alkaline Phosphatase 138 (*)    Total Bilirubin 3.8 (*)    All other components within normal limits  BRAIN NATRIURETIC PEPTIDE - Abnormal; Notable for the following components:   B Natriuretic Peptide 716.1 (*)    All other components within normal limits  MAGNESIUM - Abnormal; Notable for the following components:   Magnesium 1.6 (*)    All other components within normal limits  TROPONIN I (HIGH SENSITIVITY) - Abnormal; Notable for the following components:   Troponin I (High Sensitivity) 35 (*)    All other components within normal limits  SARS CORONAVIRUS 2 BY RT PCR     EKG  ED ECG REPORT I, Denys Labree J, the attending physician, personally viewed and interpreted this ECG.   Date: 11/10/2022  EKG Time: 0003  Rate: 139  Rhythm: sinus tachycardia  Axis: Normal  Intervals: QTC 577  ST&T Change: Nonspecific    RADIOLOGY I have independently visualized and interpreted patient's x-ray as well as noted the radiology interpretation:  X-ray: No acute cardiopulmonary process, stable cardiomegaly  Official radiology report(s): DG Chest Port 1 View  Result Date: 11/10/2022 CLINICAL DATA:  Shortness of breath EXAM: PORTABLE CHEST 1 VIEW COMPARISON:  10/10/2022 FINDINGS: Cardiac shadow is enlarged but stable. Lungs are well aerated bilaterally. No focal infiltrate or effusion is seen. No bony abnormality is noted.  IMPRESSION: Stable cardiomegaly.  No acute abnormality seen. Electronically Signed   By: Alcide Clever M.D.   On: 11/10/2022 00:17     PROCEDURES:  Critical Care performed: Yes, see critical care procedure note(s)  CRITICAL CARE Performed by: Irean Hong   Total critical care time: 45 minutes  Critical care time was exclusive of separately billable procedures and treating other patients.  Critical care was necessary to treat or prevent imminent or life-threatening deterioration.  Critical care was time spent personally by me on the following activities: development of treatment plan with patient and/or surrogate as well as nursing, discussions with consultants, evaluation of patient's response to treatment, examination of patient, obtaining history from patient or surrogate, ordering and performing treatments and interventions, ordering and review of laboratory studies, ordering and review of radiographic studies, pulse oximetry and re-evaluation of patient's condition.   Marland Kitchen1-3 Lead EKG Interpretation  Performed by: Irean Hong, MD Authorized by: Irean Hong, MD     Interpretation: abnormal     ECG rate:  135   ECG rate assessment: tachycardic     Rhythm: sinus tachycardia     Ectopy: none     Conduction: normal   Comments:     Patient placed on cardiac monitor to evaluate for arrhythmias    MEDICATIONS ORDERED IN ED: Medications  magnesium sulfate IVPB 2 g 50 mL (2 g Intravenous New Bag/Given 11/10/22 0016)  potassium chloride (KLOR-CON) packet 40 mEq (has no administration in time range)  methylPREDNISolone sodium succinate (SOLU-MEDROL) 125 mg/2 mL injection 125 mg (125 mg Intravenous Given 11/10/22 0010)  ipratropium-albuterol (DUONEB) 0.5-2.5 (3) MG/3ML nebulizer solution 3 mL (3 mLs Nebulization Given 11/10/22 0012)  ipratropium-albuterol (DUONEB) 0.5-2.5 (3) MG/3ML nebulizer solution 3 mL (3 mLs Nebulization Given 11/10/22 0012)  ipratropium-albuterol (DUONEB) 0.5-2.5 (3)  MG/3ML nebulizer solution 3 mL (3 mLs Nebulization Given 11/10/22 0011)  ondansetron (ZOFRAN) injection 4 mg (4 mg Intravenous Given 11/10/22 0010)     IMPRESSION / MDM / ASSESSMENT AND PLAN / ED COURSE  I  reviewed the triage vital signs and the nursing notes.                             27 year old female presenting with respiratory distress and hypoxia. Differential includes, but is not limited to, viral syndrome, bronchitis including COPD exacerbation, pneumonia, reactive airway disease including asthma, CHF including exacerbation with or without pulmonary/interstitial edema, pneumothorax, ACS, thoracic trauma, and pulmonary embolism.  I personally reviewed patient's records and note a recent hospitalization in the ICU at [facility] from 5/21-5/24/2024 for similar.  Patient's presentation is most consistent with acute presentation with potential threat to life or bodily function.  The patient is on the cardiac monitor to evaluate for evidence of arrhythmia and/or significant heart rate changes.  Patient placed on BiPAP immediately upon her arrival to the treatment room.  Will obtain blood work, chest x-ray.  Administer IV Solu-Medrol, DuoNebs, magnesium.  Anticipate hospitalization.  Clinical Course as of 11/10/22 0517  Fri Nov 10, 2022  0004 Patient vomiting.  Taken off BiPAP; RT to trial high flow nasal cannula oxygen. [JS]  0106 Patient feeling better on high flow nasal cannula.  Very slight nosebleed from left nostril which is controlled.  Asking for pure because she took Lasix prior to arrival.  Updated her of all laboratory and imaging results.  Will consult hospital services for evaluation and admission. [JS]  0201 Discussed at length with hospitalist services; given patient's complicated pulmonary history, will ask CCU intensivist to evaluate for admission. [JS]    Clinical Course User Index [JS] Irean Hong, MD     FINAL CLINICAL IMPRESSION(S) / ED DIAGNOSES   Final diagnoses:  ILD  (interstitial lung disease) (HCC)  Respiratory distress  Hypoxia  Hypokalemia     Rx / DC Orders   ED Discharge Orders     None        Note:  This document was prepared using Dragon voice recognition software and may include unintentional dictation errors.   Irean Hong, MD 11/10/22 [phone]

## 2022-11-09 NOTE — ED Triage Notes (Signed)
Per EMS pt's initial complaint was SOB, pt has significant lung health hx and was admitted in the hospital in the last month for similar issues. Pt arrived on cpap. MD Dolores Frame at bedside. Pt appears labored at this time and coughing through the cpap.

## 2022-11-10 ENCOUNTER — Emergency Department: Payer: Medicaid Other

## 2022-11-10 DIAGNOSIS — J9621 Acute and chronic respiratory failure with hypoxia: Secondary | ICD-10-CM | POA: Diagnosis present

## 2022-11-10 DIAGNOSIS — G4733 Obstructive sleep apnea (adult) (pediatric): Secondary | ICD-10-CM | POA: Diagnosis present

## 2022-11-10 DIAGNOSIS — B9789 Other viral agents as the cause of diseases classified elsewhere: Secondary | ICD-10-CM | POA: Diagnosis present

## 2022-11-10 DIAGNOSIS — E876 Hypokalemia: Secondary | ICD-10-CM | POA: Diagnosis present

## 2022-11-10 DIAGNOSIS — J849 Interstitial pulmonary disease, unspecified: Secondary | ICD-10-CM | POA: Diagnosis present

## 2022-11-10 DIAGNOSIS — Z7901 Long term (current) use of anticoagulants: Secondary | ICD-10-CM | POA: Diagnosis not present

## 2022-11-10 DIAGNOSIS — Z7984 Long term (current) use of oral hypoglycemic drugs: Secondary | ICD-10-CM | POA: Diagnosis not present

## 2022-11-10 DIAGNOSIS — Z8616 Personal history of COVID-19: Secondary | ICD-10-CM | POA: Diagnosis not present

## 2022-11-10 DIAGNOSIS — R0602 Shortness of breath: Secondary | ICD-10-CM | POA: Diagnosis present

## 2022-11-10 DIAGNOSIS — R17 Unspecified jaundice: Secondary | ICD-10-CM | POA: Diagnosis present

## 2022-11-10 DIAGNOSIS — D75839 Thrombocytosis, unspecified: Secondary | ICD-10-CM | POA: Diagnosis present

## 2022-11-10 DIAGNOSIS — D573 Sickle-cell trait: Secondary | ICD-10-CM | POA: Diagnosis present

## 2022-11-10 DIAGNOSIS — I5033 Acute on chronic diastolic (congestive) heart failure: Secondary | ICD-10-CM | POA: Diagnosis present

## 2022-11-10 DIAGNOSIS — I2489 Other forms of acute ischemic heart disease: Secondary | ICD-10-CM | POA: Diagnosis present

## 2022-11-10 DIAGNOSIS — R040 Epistaxis: Secondary | ICD-10-CM | POA: Diagnosis present

## 2022-11-10 DIAGNOSIS — R112 Nausea with vomiting, unspecified: Secondary | ICD-10-CM | POA: Diagnosis present

## 2022-11-10 DIAGNOSIS — Z9981 Dependence on supplemental oxygen: Secondary | ICD-10-CM | POA: Diagnosis not present

## 2022-11-10 DIAGNOSIS — I5082 Biventricular heart failure: Secondary | ICD-10-CM | POA: Diagnosis present

## 2022-11-10 DIAGNOSIS — I50810 Right heart failure, unspecified: Secondary | ICD-10-CM | POA: Diagnosis not present

## 2022-11-10 DIAGNOSIS — I2723 Pulmonary hypertension due to lung diseases and hypoxia: Secondary | ICD-10-CM | POA: Diagnosis present

## 2022-11-10 DIAGNOSIS — N170 Acute kidney failure with tubular necrosis: Secondary | ICD-10-CM | POA: Diagnosis present

## 2022-11-10 DIAGNOSIS — R0603 Acute respiratory distress: Secondary | ICD-10-CM | POA: Diagnosis not present

## 2022-11-10 DIAGNOSIS — I2722 Pulmonary hypertension due to left heart disease: Secondary | ICD-10-CM | POA: Diagnosis present

## 2022-11-10 DIAGNOSIS — Z1152 Encounter for screening for COVID-19: Secondary | ICD-10-CM | POA: Diagnosis not present

## 2022-11-10 DIAGNOSIS — Z6841 Body Mass Index (BMI) 40.0 and over, adult: Secondary | ICD-10-CM | POA: Diagnosis not present

## 2022-11-10 DIAGNOSIS — F17210 Nicotine dependence, cigarettes, uncomplicated: Secondary | ICD-10-CM | POA: Diagnosis present

## 2022-11-10 LAB — CBC WITH DIFFERENTIAL/PLATELET
Abs Immature Granulocytes: 0.06 10*3/uL (ref 0.00–0.07)
Basophils Absolute: 0.1 10*3/uL (ref 0.0–0.1)
Basophils Relative: 1 %
Eosinophils Absolute: 0.2 10*3/uL (ref 0.0–0.5)
Eosinophils Relative: 1 %
HCT: 43.8 % (ref 36.0–46.0)
Hemoglobin: 15.2 g/dL — ABNORMAL HIGH (ref 12.0–15.0)
Immature Granulocytes: 1 %
Lymphocytes Relative: 31 %
Lymphs Abs: 4.1 10*3/uL — ABNORMAL HIGH (ref 0.7–4.0)
MCH: 27.5 pg (ref 26.0–34.0)
MCHC: 34.7 g/dL (ref 30.0–36.0)
MCV: 79.3 fL — ABNORMAL LOW (ref 80.0–100.0)
Monocytes Absolute: 1.1 10*3/uL — ABNORMAL HIGH (ref 0.1–1.0)
Monocytes Relative: 8 %
Neutro Abs: 7.6 10*3/uL (ref 1.7–7.7)
Neutrophils Relative %: 58 %
Platelets: 342 10*3/uL (ref 150–400)
RBC: 5.52 MIL/uL — ABNORMAL HIGH (ref 3.87–5.11)
RDW: 22.5 % — ABNORMAL HIGH (ref 11.5–15.5)
Smear Review: NORMAL
WBC: 13.1 10*3/uL — ABNORMAL HIGH (ref 4.0–10.5)
nRBC: 0.2 % (ref 0.0–0.2)

## 2022-11-10 LAB — COMPREHENSIVE METABOLIC PANEL
ALT: 19 U/L (ref 0–44)
AST: 31 U/L (ref 15–41)
Albumin: 3.8 g/dL (ref 3.5–5.0)
Alkaline Phosphatase: 138 U/L — ABNORMAL HIGH (ref 38–126)
Anion gap: 14 (ref 5–15)
BUN: 15 mg/dL (ref 6–20)
CO2: 23 mmol/L (ref 22–32)
Calcium: 9.2 mg/dL (ref 8.9–10.3)
Chloride: 99 mmol/L (ref 98–111)
Creatinine, Ser: 1.19 mg/dL — ABNORMAL HIGH (ref 0.44–1.00)
GFR, Estimated: >60 mL/min (ref >60)
Glucose, Bld: 96 mg/dL (ref 70–99)
Potassium: 3 mmol/L — ABNORMAL LOW (ref 3.5–5.1)
Sodium: 136 mmol/L (ref 135–145)
Total Bilirubin: 3.8 mg/dL — ABNORMAL HIGH (ref 0.3–1.2)
Total Protein: 7.7 g/dL (ref 6.5–8.1)

## 2022-11-10 LAB — CBC
HCT: 44.6 % (ref 36.0–46.0)
Hemoglobin: 15.2 g/dL — ABNORMAL HIGH (ref 12.0–15.0)
MCH: 26.9 pg (ref 26.0–34.0)
MCHC: 34.1 g/dL (ref 30.0–36.0)
MCV: 78.8 fL — ABNORMAL LOW (ref 80.0–100.0)
Platelets: 346 10*3/uL (ref 150–400)
RBC: 5.66 MIL/uL — ABNORMAL HIGH (ref 3.87–5.11)
RDW: 22.1 % — ABNORMAL HIGH (ref 11.5–15.5)
WBC: 12.2 10*3/uL — ABNORMAL HIGH (ref 4.0–10.5)
nRBC: 0.2 % (ref 0.0–0.2)

## 2022-11-10 LAB — LACTIC ACID, PLASMA
Lactic Acid, Venous: 2.3 mmol/L (ref 0.5–1.9)
Lactic Acid, Venous: 2.5 mmol/L (ref 0.5–1.9)
Lactic Acid, Venous: 2 mmol/L (ref 0.5–1.9)

## 2022-11-10 LAB — RESPIRATORY PANEL BY PCR

## 2022-11-10 LAB — BASIC METABOLIC PANEL
Anion gap: 13 (ref 5–15)
BUN: 17 mg/dL (ref 6–20)
CO2: 20 mmol/L — ABNORMAL LOW (ref 22–32)
Calcium: 9.4 mg/dL (ref 8.9–10.3)
Chloride: 103 mmol/L (ref 98–111)
Creatinine, Ser: 1.04 mg/dL — ABNORMAL HIGH (ref 0.44–1.00)
GFR, Estimated: >60 mL/min (ref >60)
Glucose, Bld: 180 mg/dL — ABNORMAL HIGH (ref 70–99)
Potassium: 3.7 mmol/L (ref 3.5–5.1)
Sodium: 136 mmol/L (ref 135–145)

## 2022-11-10 LAB — PROCALCITONIN: Procalcitonin: <0.1 ng/mL

## 2022-11-10 LAB — MAGNESIUM
Magnesium: 1.6 mg/dL — ABNORMAL LOW (ref 1.7–2.4)
Magnesium: 2.3 mg/dL (ref 1.7–2.4)

## 2022-11-10 LAB — SARS CORONAVIRUS 2 BY RT PCR: SARS Coronavirus 2 by RT PCR: NEGATIVE

## 2022-11-10 LAB — MRSA NEXT GEN BY PCR, NASAL: MRSA by PCR Next Gen: NOT DETECTED

## 2022-11-10 LAB — PHOSPHORUS: Phosphorus: 3 mg/dL (ref 2.5–4.6)

## 2022-11-10 LAB — TROPONIN I (HIGH SENSITIVITY)
Troponin I (High Sensitivity): 35 ng/L — ABNORMAL HIGH (ref <18)
Troponin I (High Sensitivity): 36 ng/L — ABNORMAL HIGH (ref <18)

## 2022-11-10 LAB — GLUCOSE, CAPILLARY: Glucose-Capillary: 94 mg/dL (ref 70–99)

## 2022-11-10 LAB — BRAIN NATRIURETIC PEPTIDE: B Natriuretic Peptide: 716.1 pg/mL — ABNORMAL HIGH (ref 0.0–100.0)

## 2022-11-10 MED ORDER — FUROSEMIDE 10 MG/ML IJ SOLN
120.0000 mg | Freq: Two times a day (BID) | INTRAVENOUS | Status: DC
  Administered 2022-11-10: 120 mg via INTRAVENOUS
  Filled 2022-11-10: qty 12
  Filled 2022-11-10: qty 10

## 2022-11-10 MED ORDER — FAMOTIDINE 20 MG PO TABS
20.0000 mg | ORAL_TABLET | Freq: Two times a day (BID) | ORAL | Status: DC
  Administered 2022-11-10 – 2022-11-11 (×4): 20 mg via ORAL
  Filled 2022-11-10 (×4): qty 1

## 2022-11-10 MED ORDER — TORSEMIDE 20 MG PO TABS: 40.0000 mg | ORAL_TABLET | Freq: Two times a day (BID) | ORAL | Status: DC

## 2022-11-10 MED ORDER — SILDENAFIL CITRATE 20 MG PO TABS
20.0000 mg | ORAL_TABLET | Freq: Three times a day (TID) | ORAL | Status: DC
  Administered 2022-11-10 – 2022-11-15 (×17): 20 mg via ORAL
  Filled 2022-11-10 (×21): qty 1

## 2022-11-10 MED ORDER — TREPROSTINIL 0.6 MG/ML IN SOLN
18.0000 ug | RESPIRATORY_TRACT | Status: DC
  Administered 2022-11-12 – 2022-11-14 (×8): 18 ug via RESPIRATORY_TRACT
  Filled 2022-11-10: qty 11.6

## 2022-11-10 MED ORDER — POTASSIUM CHLORIDE 10 MEQ/100ML IV SOLN
10.0000 meq | INTRAVENOUS | Status: AC
  Administered 2022-11-10: 10 meq via INTRAVENOUS
  Filled 2022-11-10: qty 100

## 2022-11-10 MED ORDER — CHLORHEXIDINE GLUCONATE CLOTH 2 % EX PADS
6.0000 | MEDICATED_PAD | Freq: Every day | CUTANEOUS | Status: DC
  Administered 2022-11-10 – 2022-11-15 (×6): 6 via TOPICAL

## 2022-11-10 MED ORDER — HEPARIN SODIUM (PORCINE) 5000 UNIT/ML IJ SOLN: 5000.0000 [IU] | Freq: Three times a day (TID) | INTRAMUSCULAR | Status: DC

## 2022-11-10 MED ORDER — POTASSIUM CHLORIDE 10 MEQ/100ML IV SOLN
INTRAVENOUS | Status: AC
  Administered 2022-11-10: 10 meq via INTRAVENOUS
  Filled 2022-11-10: qty 100

## 2022-11-10 MED ORDER — POTASSIUM CHLORIDE CRYS ER 20 MEQ PO TBCR
40.0000 meq | EXTENDED_RELEASE_TABLET | Freq: Two times a day (BID) | ORAL | Status: DC
  Administered 2022-11-10: 40 meq via ORAL
  Filled 2022-11-10: qty 2

## 2022-11-10 MED ORDER — TORSEMIDE 20 MG PO TABS: 60.0000 mg | ORAL_TABLET | Freq: Two times a day (BID) | ORAL | Status: DC

## 2022-11-10 MED ORDER — POLYETHYLENE GLYCOL 3350 17 G PO PACK: 17.0000 g | PACK | Freq: Every day | ORAL | Status: DC | PRN

## 2022-11-10 MED ORDER — DOCUSATE SODIUM 100 MG PO CAPS
100.0000 mg | ORAL_CAPSULE | Freq: Two times a day (BID) | ORAL | Status: DC | PRN
  Administered 2022-11-11: 100 mg via ORAL
  Filled 2022-11-10: qty 1

## 2022-11-10 MED ORDER — METOLAZONE 5 MG PO TABS
5.0000 mg | ORAL_TABLET | Freq: Every day | ORAL | Status: DC
  Administered 2022-11-10: 5 mg via ORAL
  Filled 2022-11-10 (×2): qty 1

## 2022-11-10 MED ORDER — POTASSIUM CHLORIDE 20 MEQ PO PACK
40.0000 meq | PACK | Freq: Two times a day (BID) | ORAL | Status: DC
  Administered 2022-11-10 – 2022-11-19 (×18): 40 meq via ORAL
  Filled 2022-11-10 (×18): qty 2

## 2022-11-10 MED ORDER — SPIRONOLACTONE 25 MG PO TABS
25.0000 mg | ORAL_TABLET | Freq: Every day | ORAL | Status: DC
  Administered 2022-11-10: 25 mg via ORAL
  Filled 2022-11-10: qty 1

## 2022-11-10 MED ORDER — ONDANSETRON HCL 4 MG/2ML IJ SOLN
4.0000 mg | Freq: Once | INTRAMUSCULAR | Status: AC
  Administered 2022-11-10: 4 mg via INTRAVENOUS
  Filled 2022-11-10: qty 2

## 2022-11-10 MED ORDER — POTASSIUM CHLORIDE 20 MEQ PO PACK
40.0000 meq | PACK | Freq: Once | ORAL | Status: AC
  Administered 2022-11-10: 40 meq via ORAL
  Filled 2022-11-10: qty 2

## 2022-11-10 MED ORDER — APIXABAN 5 MG PO TABS
5.0000 mg | ORAL_TABLET | Freq: Two times a day (BID) | ORAL | Status: DC
  Administered 2022-11-10 – 2022-11-19 (×20): 5 mg via ORAL
  Filled 2022-11-10 (×20): qty 1

## 2022-11-10 MED ORDER — ONDANSETRON HCL 4 MG/2ML IJ SOLN
4.0000 mg | Freq: Four times a day (QID) | INTRAMUSCULAR | Status: DC | PRN
  Administered 2022-11-10 – 2022-11-18 (×3): 4 mg via INTRAVENOUS
  Filled 2022-11-10 (×3): qty 2

## 2022-11-10 MED ORDER — ACETAMINOPHEN 325 MG PO TABS
650.0000 mg | ORAL_TABLET | Freq: Four times a day (QID) | ORAL | Status: DC | PRN
  Administered 2022-11-10: 650 mg via ORAL
  Filled 2022-11-10: qty 2

## 2022-11-10 MED ORDER — TORSEMIDE 20 MG PO TABS
40.0000 mg | ORAL_TABLET | Freq: Two times a day (BID) | ORAL | Status: DC
  Administered 2022-11-10: 40 mg via ORAL
  Filled 2022-11-10: qty 2

## 2022-11-10 NOTE — Consult Note (Signed)
Advanced Heart Failure Team Consult Note   Primary Physician: [facility] PCP-Cardiologist:  None  Reason for Consultation: RV failure/pulmonary hypertension  HPI:    Yesenia Carrillo is seen today for evaluation of RV failure/pulmonary hypertension at the request of Dr. Aundria Rud.   27 y.o. with history of chronic interstitial lung disease/inflammatory pneumonitis, pulmonary hypertension, HFpEF, PE, splenic vein thrombosis, OSA, sickle cell trait, chronic thrombocytosis/leukocytosis was admitted with acute on chronic hypoxemic respiratory failure.    Patient has a very long pulmonary history.  She has had multiple admissions for respiratory failure with intubation and ECMO.  In 2019, she had PNA complicated by ARDS resulting in ECMO, this was complicated by retroperitoneal bleed and a PE thought to have been provoked in setting of ECMO.  She again had PNA complicated by ARDS with intubation in 6/20.  She had COVID PNA in 11/20.  During 5/21 admission with acute on chronic hypoxemic respiratory failure, she was found to have splenic vein thrombosis. In 2/22, she was admitted for preeclampsia and CHF while pregnant, had wedge-shaped defects on V/Q scan initially concerning for PE but seen by pulmonary and thought to represent her underlying lung disease. She had a right heart cath during this time with pulmonary hypertension thought to be primarily due to HFpEF, OSA, and pulmonary disease (groups 2 and 3). RHC in 6/23 showed mean RA 10, PA 90/40 mean 58, mean PCWP 10, CI 1.5.  Since that time, she has been on Tyvaso inhaled and sildenafil 20 mg tid. She has been followed by [facility] pulmonology, thought to have interstitial lung process of unclear etiology. PFTs have shown restriction with severely decreased DLCO and serial CTs have shown diffuse GGOs and bibasilar scattered consolidation.  Suspect some form of inflammatory pneumonitis, probably not hypersensitivity pneumonitis as  there has not been much response to steroids. She was thought to be too high risk for lung biopsy.   Echo 12/23 showed EF 55-60%, moderately dilated RV with moderate systolic dysfunction, PASP 67 mmHg.   She was admitted in 4/24 to [facility] with ILD flare thought to be related to rhinovirus infection.   Admitted to [facility] in 5/24  with respiratory failure and cor pulmonale. Seen by Dr. Shirlee Latch. Underwent RHC with severe PAH and low output (see below). Started on milrinone and diuresed with lasix gtt. Milrinone weaned. Tyvaso continued and sildenafil increased to 40 tid. Hospitalization c/b copious fluid intake and unwillingness to comply with fluid restriction. F/u Arranged with [facility].Weight at d/c 226 pounds   RHC 10/12/22 RA mean 26 PA 77/39, mean 55 PCWP mean 9 Cardiac Output (Fick) 3.67  Cardiac Index (Fick) 1.8 PVR 12.5 WU PAPi 1.46   Admitted to [facility] on 20/24 with recurrent respiratory failure and volume overload. Diuresed with IV lasix and improved. Torsemide increased to 60 bid. Discharged on 10/27/22. At baseline on 8L home O2. Weight at d/c 233 pounds.   Readmitted overnight with recurrent respiratory failure. Initial vital signs HR 135, BP 112/61, RR 45 breaths/minute, and the oxygen saturation 95% on NRB . Patient was placed on BiPAP due to respiratory distress unfortunately she started vomiting 5 minutes later and was transitioned to 50L @ 100% HFNC. PCT: negative <0.10 Lactic acid:  COVID PCR: Negative, Troponin: 35 BNP: 716.1 Weight 240 pounds  Now on IV lasix. Sitting up in bed on HFNC. Denies dyspnea, orthopnea or PND. Sitting up in bed ordering food and asking RN for another glass of water  Review of Systems: All systems reviewed and negative except as per HPI.   Home Medications Prior to Admission medications   Medication Sig Start Date End Date Taking? Authorizing Provider  albuterol (VENTOLIN HFA) 108 (90 Base) MCG/ACT inhaler Inhale 1-2 puffs into the lungs  every 6 (six) hours as needed for wheezing or shortness of breath. 07/25/21  Yes [provider]  ELIQUIS 5 MG TABS tablet Take 5 mg by mouth 2 (two) times daily.   Yes [provider]  empagliflozin (JARDIANCE) 10 MG TABS tablet Take 10 mg by mouth daily. 10/19/20  Yes [provider]  famotidine (PEPCID) 20 MG tablet Take 20 mg by mouth 2 (two) times daily. 08/30/22 10/29/22 Yes [provider]  Fluticasone-Umeclidin-Vilant (TRELEGY ELLIPTA) 200-62.5-25 MCG/ACT AEPB Inhale 1 puff into the lungs daily.   Yes [provider]  ipratropium-albuterol (DUONEB) 0.5-2.5 (3) MG/3ML SOLN Take 3 mLs by nebulization every 6 (six) hours as needed. 09/29/22  Yes Darlin Priestly, MD  sildenafil (REVATIO) 20 MG tablet Take 20 mg by mouth 3 (three) times daily. 04/19/22  Yes [provider]  spironolactone (ALDACTONE) 25 MG tablet Take 25 mg by mouth daily.   Yes [provider]  torsemide (DEMADEX) 20 MG tablet Take 40 mg by mouth 2 (two) times daily. 05/10/22 05/10/23 Yes [provider]  TYVASO REFILL 0.6 MG/ML SOLN Inhale 18 mcg into the lungs every 4 (four) hours while awake. 11/30/21  Yes [provider]    Past Medical History: Past Medical History:  Diagnosis Date   CHF (congestive heart failure) (HCC)    COVID-19 04/2019   patient reports diagnosis in Nov 2020   Hypertension    Interstitial lung disease (HCC)    Morbid obesity with BMI of 40.0-44.9, adult (HCC)    Pre-eclampsia     Past Surgical History: Past Surgical History:  Procedure Laterality Date   ECMO CANNULATION     RIGHT HEART CATH N/A 10/12/2022   Procedure: RIGHT HEART CATH;  Surgeon: Laurey Morale, MD;  Location: [facility] INVASIVE CV LAB;  Service: Cardiovascular;  Laterality: N/A;   TONSILLECTOMY     WISDOM TOOTH EXTRACTION      Family History: Family History  Problem Relation Age of Onset   Healthy Mother    Healthy Father     Social History: Social  History   Socioeconomic History   Marital status: Single    Spouse name: Not on file   Number of children: Not on file   Years of education: Not on file   Highest education level: Not on file  Occupational History   Not on file  Tobacco Use   Smoking status: Former    Years: 2    Types: Cigarettes    Quit date: 12/05/2015    Years since quitting: 6.9   Smokeless tobacco: Never   Tobacco comments:    2 cig/day  Vaping Use   Vaping Use: Never used  Substance and Sexual Activity   Alcohol use: Not Currently   Drug use: No   Sexual activity: Not on file  Other Topics Concern   Not on file  Social History Narrative   Not on file   Social Determinants of Health   Financial Resource Strain: Not on file  Food Insecurity: No Food Insecurity (09/27/2022)   Hunger Vital Sign    Worried About Running Out of Food in the Last Year: Never true    Ran Out of Food in the Last  Year: Never true  Transportation Needs: No Transportation Needs (09/27/2022)   PRAPARE - Administrator, Civil Service (Medical): No    Lack of Transportation (Non-Medical): No  Physical Activity: Not on file  Stress: Not on file  Social Connections: Not on file    Allergies:  Allergies  Allergen Reactions   Ampicillin Rash and Other (See Comments)    Patient and family cannot remember the specifics of the reaction.   Penicillins Hives    Previously tolerated cefazolin and cefepime 05/03/18    Objective:    Vital Signs:   Temp:  [97.6 F (36.4 C)-99 F (37.2 C)] 97.6 F (36.4 C) (06/07 1033) Pulse Rate:  [96-138] 97 (06/07 1033) Resp:  [22-45] 25 (06/07 1033) BP: (103-132)/(63-113) 103/79 (06/07 1033) SpO2:  [90 %-100 %] 92 % (06/07 1033) FiO2 (%):  [80 %-100 %] 80 % (06/07 0822) Weight:  [109 kg] 109 kg (06/06 2356)    Weight change: Filed Weights   11/09/22 2356  Weight: 109 kg    Intake/Output:   Intake/Output Summary (Last 24 hours) at 11/10/2022 1247 Last data filed at  11/10/2022 1034 Gross per 24 hour  Intake 150 ml  Output 325 ml  Net -175 ml      Physical Exam    General: Obese woman sitting up in bed on HFNC HEENT: normal Neck: supple. Thick neck unable to see JVP . ZOX:WRUEAVW rate & rhythm. No rubs, gallops or murmurs. Lungs: + mild crackles Abdomen: obese  soft, nontender, nondistended. No hepatosplenomegaly. No bruits or masses. Good bowel sounds. Extremities: no cyanosis, clubbing, rash, 1-2+  edema Neuro: alert & orientedx3, cranial nerves grossly intact. moves all 4 extremities w/o difficulty. Affect pleasant    Telemetry   NSR 90-100 Personally reviewed  Labs   Basic Metabolic Panel: Recent Labs  Lab 11/10/22 0003 11/10/22 0507  NA 136 136  K 3.0* 3.7  CL 99 103  CO2 23 20*  GLUCOSE 96 180*  BUN 15 17  CREATININE 1.19* 1.04*  CALCIUM 9.2 9.4  MG 1.6* 2.3  PHOS  --  3.0    Liver Function Tests: Recent Labs  Lab 11/10/22 0003  AST 31  ALT 19  ALKPHOS 138*  BILITOT 3.8*  PROT 7.7  ALBUMIN 3.8   No results for input(s): "LIPASE", "AMYLASE" in the last 168 hours.  No results for input(s): "AMMONIA" in the last 168 hours.  CBC: Recent Labs  Lab 11/10/22 0003 11/10/22 0507  WBC 13.1* 12.2*  NEUTROABS 7.6  --   HGB 15.2* 15.2*  HCT 43.8 44.6  MCV 79.3* 78.8*  PLT 342 346    Cardiac Enzymes: No results for input(s): "CKTOTAL", "CKMB", "CKMBINDEX", "TROPONINI" in the last 168 hours.  BNP: BNP (last 3 results) Recent Labs    09/26/22 2131 10/10/22 1947 11/10/22 0003  BNP 462.9* 399.9* 716.1*    ProBNP (last 3 results) No results for input(s): "PROBNP" in the last 8760 hours.   CBG: Recent Labs  Lab 11/10/22 1238  GLUCAP 94    Coagulation Studies: No results for input(s): "LABPROT", "INR" in the last 72 hours.   Imaging   DG Chest Port 1 View  Result Date: 11/10/2022 CLINICAL DATA:  Shortness of breath EXAM: PORTABLE CHEST 1 VIEW COMPARISON:  10/10/2022 FINDINGS: Cardiac shadow is  enlarged but stable. Lungs are well aerated bilaterally. No focal infiltrate or effusion is seen. No bony abnormality is noted. IMPRESSION: Stable cardiomegaly.  No acute abnormality seen. Electronically  Signed   By: Alcide Clever M.D.   On: 11/10/2022 00:17     Medications:     Current Medications:  apixaban  5 mg Oral BID   famotidine  20 mg Oral BID   sildenafil  20 mg Oral TID   spironolactone  25 mg Oral Daily   torsemide  60 mg Oral BID   Treprostinil  18 mcg Inhalation Q4H while awake    Infusions:     Assessment/Plan   1. Acute on chronic hypoxemic respiratory failure: Patient is on home oxygen 8L  at baseline, now requiring HFNC/BIPAP. - suspect this is due primarily to volume overload on top of severe PAH - weight is up nearly 15 pounds since previous admit here - agree with IV diuresis with IV lasix and metolazone - She has been very non-complaint with fluid restriction and I doubt her course will change unless she will cut down her intake - Fluid restrict 1500cc   2. HFpEF with prominent RV failure and pulmonary hypertension (mixed pulmonary arterial/pulmonary venous hypertension in past):  - Echo 5/24 EF 55-60%, mild LVH, D-shaped septum suggestive of RV pressure/volume overload with peak RV-RA gradient 54 mmHg, moderate RV enlargement with severe decreased systolic function.  - RHC 5/24 showed severe PAH with severely elevated RA pressure and moderately decreased PAPi at 1.45, CI low at 1.8.  - Diurese - Continue Jardiance and spiro - Fluid restrict  3. Pulmonary hypertension: This has been thought to be predominantly groups 2 and 3 PH from HFpEF, parenchymal lung disease (ILD process) and OSA. However, she has been on Tyvaso and sildenafil through her pulmonologist at [facility].  She is already on 8L home oxygen and I worry that she is heading towards end stage RV failure. RHC in 2022 per report had elevated PCWP.  RHC in 6/23 showed mean RA 10, PA 90/40 mean 58, mean  PCWP 10, CI 1.5. Repeat RHC this admission with normal PCWP, severe PAH with high RA pressure, moderately low PAPi, and CI 1.8.  Low cardiac output is concerning. - Suspect predominantly WHO Group II and III PH. She has been on sildenafil and Tyvaso for some question of ILD related PAH. Can continue for now. Agree with reducing sildenafil dose to 20 tid for now - Diurese as above - Needs weight loss - Will need f/u at [facility] - Will need outpatient eval for Bipap   4. 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  She has been anticoagulated.  - Continue apixaban.    5. Hypokalemia - supp   Not much else to offer here acutely except need for diuresis and fluid restriction. I had a difficult talk with her about this and she was quite resistant.   Cardiology will see again on Monday unless called over the weekend.   CRITICAL CARE Performed by: Arvilla Meres  Total critical care time: 55 minutes  Critical care time was exclusive of separately billable procedures and treating other patients.  Critical care was necessary to treat or prevent imminent or life-threatening deterioration.  Critical care was time spent personally by me (independent of midlevel providers or residents) on the following activities: development of treatment plan with patient and/or surrogate as well as nursing, discussions with consultants, evaluation of patient's response to treatment, examination of patient, obtaining history from patient or surrogate, ordering and performing treatments and interventions, ordering and review of laboratory studies, ordering and review of  radiographic studies, pulse oximetry and re-evaluation of patient's condition.     Length of Stay: 0  Arvilla Meres, MD  11/10/2022, 12:47 PM  Advanced Heart Failure Team Pager [phone] (M-F; 7a - 5p)  Please contact [facility] for night-coverage after hours (4p  -7a ) and weekends on [email or web address]

## 2022-11-10 NOTE — ED Notes (Signed)
Talked with pt about starting the potassium and pt asked to start at 0730. MAR updated to reflect the change.

## 2022-11-10 NOTE — Plan of Care (Signed)
  Problem: Education: Goal: Understanding of CV disease, CV risk reduction, and recovery process will improve Outcome: Progressing Goal: Individualized Educational Video(s) Outcome: Progressing   Problem: Health Behavior/Discharge Planning: Goal: Ability to safely manage health-related needs after discharge will improve Outcome: Progressing   Problem: Health Behavior/Discharge Planning: Goal: Ability to manage health-related needs will improve Outcome: Progressing

## 2022-11-10 NOTE — ED Notes (Signed)
Pt asking for a break before starting the next bag of potassium.

## 2022-11-10 NOTE — Consult Note (Signed)
PHARMACY CONSULT NOTE  Pharmacy Consult for Electrolyte Monitoring and Replacement   Recent Labs: Potassium (mmol/L)  Date Value  11/10/2022 3.7  06/06/2013 3.2 (L)   Magnesium (mg/dL)  Date Value  40/98/1191 2.3   Calcium (mg/dL)  Date Value  47/82/9562 9.4   Calcium, Total (mg/dL)  Date Value  13/01/6577 9.4   Albumin (g/dL)  Date Value  46/96/2952 3.8  06/06/2013 3.9   Phosphorus (mg/dL)  Date Value  84/13/2440 3.0   Sodium (mmol/L)  Date Value  11/10/2022 136  06/06/2013 139     Assessment: 27 yo female with PMH including HTN, HFpEF, pulmonary hypertension, Hx of PE, and chronic respiratory failure presented to the ED with respiratory distress.  Admitted for treatment of acute on chronic respiratory failure and acute on chronic CHF.  Goal of Therapy:  WNL  Plan:  No replacement indicated at this time Follow-up electrolytes with AM labs  Barrie Folk ,PharmD Clinical Pharmacist 11/10/2022 10:44 AM

## 2022-11-10 NOTE — Progress Notes (Signed)
This Mirna Mires responded to a page and visited Pt at bedside. Pt expressed frustration at the unexpected change of health she has encountered at her prime. This Chaplain offered reflective listening as well as a prayer as requested by the Pt.   11/10/22 1900  Spiritual Encounters  Type of Visit Initial  Care provided to: Patient  Conversation partners present during encounter Nurse  Referral source Patient request  Reason for visit Urgent spiritual support  OnCall Visit Yes  Spiritual Framework  Presenting Themes Coping tools;Courage hope and growth;Significant life change  Patient Stress Factors Health changes;Family relationships  Interventions  Spiritual Care Interventions Made Compassionate presence;Established relationship of care and support;Reflective listening  Intervention Outcomes  Outcomes Awareness of support;Connection to spiritual care;Reduced isolation  Spiritual Care Plan  Spiritual Care Issues Still Outstanding No further spiritual care needs at this time (see row info)

## 2022-11-10 NOTE — ED Notes (Signed)
Called lab to send 20 pathogen respiratory panel.

## 2022-11-10 NOTE — Progress Notes (Signed)
Pt transported to ICU 5 from ER on NRB and pt placed back on optiflow once  in ICU bed, bipap also taken to ICU for PRN use.

## 2022-11-10 NOTE — Progress Notes (Signed)
Placed patient on bipap due to respiratory distress. Patient starting throwing up 5 minutes later. Transitioned patient to 50L @ 100% HFNC.

## 2022-11-10 NOTE — Progress Notes (Signed)
Patient admitted to ICU room 5. Pt AOx4, Oriented to room, call bell, and staff. Bed in lowest position. Fall safety plan reviewed. Full assessment to Epic. Skin assessment verified with  Mittie Bodo. Will continue to monitor.

## 2022-11-10 NOTE — ED Notes (Signed)
RT was at bedside.

## 2022-11-10 NOTE — H&P (Signed)
NAME:  Yesenia Carrillo, MRN:  [id number], DOB:  [birth year], LOS: 0 ADMISSION DATE:  11/09/2022, CONSULTATION DATE:  11/10/22 REFERRING MD:  Chiquita Loth  CHIEF COMPLAINT:  SOB   HPI  Yesenia Carrillo is a 27 y.o. female who has a PMH of morbid obesity, HTN, HFpEF, pulmonary hypertension (group 2 and 3) on tyvaso and sildenafil, provoked PE on anticoagulation, prior splenic vein thrombosis with splenic infarcts, chronic idiopathic leukocytosis/thrombocytosis, sickle cell trait, interstitial lung disease NOS, not on immunosuppression, Chronic respiratory failure with hypoxia on oxygen 8-10L at home baseline, HSV infection,  Iron deficiency, and Polycythemia who presented to the ED with respiratory distress.  On review of her chart, patient was recently admitted at [facility] in the MICU unit on 10/23/2022 -10/27/2022 with acute on chronic hypoxemic respiratory failure secondary to pulmonary hypertension and volume overload. She was initially placed on high flow nasal cannula but was able to be transitioned to her home nasal cannula oxygen at 8 to 10 L with aggressive IV diuresis. Her torsemide was also increased to 60 mg twice daily with recommendations to follow up with pulmonary hypertension clinic.   ED Course: Initial vital signs showed HR of 135 beats/minute, BP 112/61 mm Hg, the RR 45 breaths/minute, and the oxygen saturation 95% on NRB and a temperature of 99.65F (37.2C). Patient was placed on BiPAP due to respiratory distress unfortunately she started vomiting 5 minutes later and was transitioned to 50L @ 100% HFNC.  Pertinent Labs/Diagnostics Findings: Na+/ K+: 136/3.0 Glucose: 969 BUN/Cr.:15/1.19 WBC:13.1 Hgb/Hct: 15.2/43.8  PCT: negative <0.10 Lactic acid:  COVID PCR: Negative, Troponin: 35 BNP: 716.1 CXR: No acute abnormality  Patient was treated with Duonebs, solumedrol 125 mg IV and mag sulfate 2g. Due to high risk for intubation, PCCM consulted for admission.  Past Medical History  morbid obesity,  HTN, HFpEF, pulmonary hypertension (group 2 and 3) on tyvaso and sildenafil, provoked PE on anticoagulation, prior splenic vein thrombosis with splenic infarcts, chronic idiopathic leukocytosis/thrombocytosis, sickle cell trait, interstitial lung disease NOS, not on immunosuppression, Chronic respiratory failure with hypoxia on oxygen 8-10L at home baseline, HSV infection,  Iron deficiency, and Polycythemia  Significant Hospital Events   6/7: Admitted to ICU with acute on chronic hypoxemic respiratory failure  Consults:  Cardiology  Procedures:  None  Significant Diagnostic Tests:  6/7: Chest Xray> no acute cardiopulmonary process  Interim History / Subjective:  -remains on HFNC  Micro Data:  6/7: SARS-CoV-2 PCR> negative 6/7: Influenza PCR> negative 6/7: Blood culture x2> 6/7: MRSA PCR>>   Antimicrobials:  None  OBJECTIVE  Blood pressure (!) 128/99, pulse (!) 113, temperature 99 F (37.2 C), temperature source Oral, resp. rate (!) 25, height 5\' 1"  (1.549 m), weight 109 kg, SpO2 90 %, unknown if currently breastfeeding.    FiO2 (%):  [80 %-100 %] 80 %   Intake/Output Summary (Last 24 hours) at 11/10/2022 0329 Last data filed at 11/10/2022 0116 Gross per 24 hour  Intake 50 ml  Output --  Net 50 ml   Filed Weights   11/09/22 2356  Weight: 109 kg   Physical Examination  GENERAL: 27 year-old critically ill patient lying in the bed in no acute distress on HFNC EYES: PEERLA. No scleral icterus. Extraocular muscles intact.  HEENT: Head atraumatic, normocephalic. Oropharynx and nasopharynx clear.  NECK:  No JVD, supple  LUNGS: Decreased breath sounds bilaterally.  Coarse crackles bilaterally. No use of accessory muscles of respiration.  CARDIOVASCULAR: S1, S2 normal. No murmurs, rubs,  or gallops.  ABDOMEN: Soft, NTND EXTREMITIES: +1 pitting edema of bilateral lower extremities. Capillary refill is less than 3 seconds in all extremities. Pulses palpable distally. NEUROLOGIC:  The patient is alert and oriented x 4. No focal neurological deficit Cranial nerves are intact.  SKIN: No obvious rash, lesion, or ulcer. Warm to touch Labs/imaging that I havepersonally reviewed  (right click and "Reselect all SmartList Selections" daily)     Labs   CBC: Recent Labs  Lab 11/10/22 0003  WBC 13.1*  NEUTROABS 7.6  HGB 15.2*  HCT 43.8  MCV 79.3*  PLT 342    Basic Metabolic Panel: Recent Labs  Lab 11/10/22 0003  NA 136  K 3.0*  CL 99  CO2 23  GLUCOSE 96  BUN 15  CREATININE 1.19*  CALCIUM 9.2  MG 1.6*   GFR: Estimated Creatinine Clearance: 81.8 mL/min (A) (by C-G formula based on SCr of 1.19 mg/dL (H)). Recent Labs  Lab 11/10/22 0003  WBC 13.1*    Liver Function Tests: Recent Labs  Lab 11/10/22 0003  AST 31  ALT 19  ALKPHOS 138*  BILITOT 3.8*  PROT 7.7  ALBUMIN 3.8   No results for input(s): "LIPASE", "AMYLASE" in the last 168 hours. No results for input(s): "AMMONIA" in the last 168 hours.  ABG    Component Value Date/Time   PHART 7.44 10/29/2019 1224   PCO2ART 37 10/29/2019 1224   PO2ART 60 (L) 10/29/2019 1224   HCO3 27.0 10/12/2022 1307   TCO2 28 10/12/2022 1011   ACIDBASEDEF 0.6 10/30/2019 0649   O2SAT 62.6 10/17/2022 0447     Coagulation Profile: No results for input(s): "INR", "PROTIME" in the last 168 hours.  Cardiac Enzymes: No results for input(s): "CKTOTAL", "CKMB", "CKMBINDEX", "TROPONINI" in the last 168 hours.  HbA1C: Hgb A1c MFr Bld  Date/Time Value Ref Range Status  08/09/2022 08:53 AM 6.1 (H) 4.8 - 5.6 % Final    Comment:    (NOTE)         Prediabetes: 5.7 - 6.4         Diabetes: >6.4         Glycemic control for adults with diabetes: <7.0   05/24/2022 03:02 AM 6.2 (H) 4.8 - 5.6 % Final    Comment:    (NOTE)         Prediabetes: 5.7 - 6.4         Diabetes: >6.4         Glycemic control for adults with diabetes: <7.0     CBG: No results for input(s): "GLUCAP" in the last 168 hours.  Review of  Systems:   Review of Systems  Constitutional:  Negative for chills, diaphoresis, fever, malaise/fatigue and weight loss.  HENT:  Negative for congestion, ear discharge, ear pain, hearing loss, nosebleeds, sinus pain, sore throat and tinnitus.   Eyes: Negative.   Respiratory:  Positive for cough, shortness of breath and wheezing. Negative for hemoptysis, sputum production and stridor.   Cardiovascular:  Positive for leg swelling and PND. Negative for chest pain, palpitations, orthopnea and claudication.  Gastrointestinal:  Positive for abdominal pain, nausea and vomiting. Negative for blood in stool, constipation, diarrhea, heartburn and melena.  Genitourinary: Negative.   Musculoskeletal: Negative.   Skin:  Negative for itching and rash.  Endo/Heme/Allergies: Negative.   Psychiatric/Behavioral: Negative.     Past Medical History  She,  has a past medical history of CHF (congestive heart failure) (HCC), COVID-19 (04/2019), Hypertension, Interstitial lung disease (HCC), Morbid  obesity with BMI of 40.0-44.9, adult (HCC), and Pre-eclampsia.   Surgical History    Past Surgical History:  Procedure Laterality Date   ECMO CANNULATION     RIGHT HEART CATH N/A 10/12/2022   Procedure: RIGHT HEART CATH;  Surgeon: Laurey Morale, MD;  Location: [facility] INVASIVE CV LAB;  Service: Cardiovascular;  Laterality: N/A;   TONSILLECTOMY     WISDOM TOOTH EXTRACTION       Social History   reports that she quit smoking about 6 years ago. Her smoking use included cigarettes. She has never used smokeless tobacco. She reports that she does not currently use alcohol. She reports that she does not use drugs.   Family History   Her family history includes Healthy in her father and mother.   Allergies Allergies  Allergen Reactions   Ampicillin Rash and Other (See Comments)    Patient and family cannot remember the specifics of the reaction.   Penicillins Hives    Previously tolerated cefazolin and cefepime  05/03/18     Home Medications  Prior to Admission medications   Medication Sig Start Date End Date Taking? Authorizing Provider  albuterol (VENTOLIN HFA) 108 (90 Base) MCG/ACT inhaler Inhale 1-2 puffs into the lungs every 6 (six) hours as needed for wheezing or shortness of breath. 07/25/21  Yes [provider]  digoxin (LANOXIN) 0.125 MG tablet Take 1 tablet (0.125 mg total) by mouth daily. 10/18/22  Yes Enedina Finner, MD  ELIQUIS 5 MG TABS tablet Take 5 mg by mouth 2 (two) times daily.   Yes [provider]  famotidine (PEPCID) 20 MG tablet Take 20 mg by mouth 2 (two) times daily.   Yes [provider]  ipratropium-albuterol (DUONEB) 0.5-2.5 (3) MG/3ML SOLN Take 3 mLs by nebulization every 6 (six) hours as needed. 09/29/22  Yes Darlin Priestly, MD  midodrine (PROAMATINE) 5 MG tablet Take 1 tablet (5 mg total) by mouth 3 (three) times daily with meals. 10/17/22  Yes Enedina Finner, MD  sildenafil (REVATIO) 20 MG tablet Take 20 mg by mouth 3 (three) times daily. 04/19/22  Yes [provider]  spironolactone (ALDACTONE) 25 MG tablet Take 25 mg by mouth daily.   Yes [provider]  torsemide (DEMADEX) 20 MG tablet Take 2 tablets (40 mg total) by mouth 2 (two) times daily. 10/17/22 10/17/23 Yes Enedina Finner, MD  TYVASO REFILL 0.6 MG/ML SOLN Inhale 18 mcg into the lungs every 4 (four) hours while awake. 11/30/21  Yes [provider]  calcium carbonate (TUMS - DOSED IN MG ELEMENTAL CALCIUM) 500 MG chewable tablet Chew 1 tablet (200 mg of elemental calcium total) by mouth 3 (three) times daily as needed for indigestion or heartburn. Patient not taking: Reported on 11/10/2022 10/17/22   Enedina Finner, MD  Fluticasone-Umeclidin-Vilant (TRELEGY ELLIPTA) 200-62.5-25 MCG/ACT AEPB Inhale 1 puff into the lungs daily. Patient not taking: Reported on 11/10/2022    [provider]  hydrocortisone cream 1 % Apply topically as needed for itching. Patient not taking: Reported  on 11/10/2022 10/17/22   Enedina Finner, MD  lidocaine (XYLOCAINE) 2 % jelly Apply 1 Application topically daily as needed (vaginal itching). Patient not taking: Reported on 11/10/2022 10/17/22   Enedina Finner, MD  Scheduled Meds:  apixaban  5 mg Oral BID   famotidine  20 mg Oral BID   sildenafil  20 mg Oral TID   spironolactone  25 mg Oral Daily   torsemide  60 mg Oral BID   Treprostinil  18 mcg Inhalation Q4H while awake   Continuous Infusions:  potassium chloride 10 mEq (11/10/22 0316)   PRN Meds:.docusate sodium, polyethylene glycol  Active Hospital Problem list   See below  Assessment & Plan:  #Acute on chronic respiratory failure on oxygen 8-10L at home baseline #ILD  #Pulmonary Hypertension  Suspect acute presentation likely volume overload vs ILD flair 2/2 possible viral infection although RPP negative. BNP was slightly elevated and chest xray with no signs of vascular congestion. Low suspicion for PE given compliant with anticoagulation for hx of PE  - Currently on HFNC 50L 100 FiO2. wean O2 as able - restart Sildenafil 20 MG TID - continueTyvaso 24 mcg every 4 hours while awake - Duo-nebs PRN and scheduled - Restart home Torsemide with goal output of 531mL-1L - strict I/O's - Daily weights - Hold steroids for now, received 125 mg of Solumedrol x1 in the ED - Cards consult  #Acute on Chronic HFpEF: Last EF on 10/26/22 60-65% #Mildly Elevated Trop Recent admission with [facility] catheterization that showed severe PAH with severely elevated RA pressure and moderately decreased PAP. Has been compliant with medications at home.  BNP slightly elevated on admission, CXR without overt pulmonary vascular congestion. -Trend troponin - Diuresis as above - continue digoxin 125 mcg - Continue spironolactone 25 mg daily - Cardiology consult   #Leukocytosis : WBC13.1 likely reactive -F/u cultures, trend lactic/ PCT -Monitor WBC/ fever curve -Hold abx fo now given no obvious source  #AKI  likely ATN in the setting of above #Hypokalemia on admission at 3.0. Likely related to poor PO intake and vomiting. - Monitor I&O's / urinary output - Follow BMP - Ensure adequate renal perfusion - Avoid nephrotoxic agents as able - Replace electrolytes as indicated  #Nausea/Vomiting  #Elevated Bilirubin likely related to hepatic congestion from RHF? Denies RUQ pain Prior RUQ [place] with a distended gallbladder however no evidence of acute cholecystitis. During her previously hospitalization, CT AP showed mild inflammation around gallbladder but RUQ was negative at that time.   - Regular diet  - daily liver function panel   Best practice:  Diet:  Oral Pain/Anxiety/Delirium protocol (if indicated): No VAP protocol (if indicated): Not indicated DVT prophylaxis: Systemic AC GI prophylaxis: N/A Glucose control:  SSI No Central venous access:  N/A Arterial line:  N/A Foley:  N/A Mobility:  bed rest  PT consulted: N/A Last date of multidisciplinary goals of care discussion [6/7] Code Status:  full code Disposition: ICU   = Goals of Care = Code Status Order: FULL   Primary Emergency Contact: Lenhardt,Tasha, Home Phone: [phone] Wishes to pursue full aggressive treatment and intervention options, including CPR and intubation,  Critical care time: 45 minutes        Webb Silversmith DNP, CCRN, FNP-C, AGACNP-BC Acute Care & Family Nurse Practitioner [facility] PCCM on call pager [phone]

## 2022-11-10 NOTE — ED Notes (Signed)
Wrote to Dr Moshe Salisbury re: diet order? Pt was requesting menu and juice to drink. Also informed lactic 2.5.

## 2022-11-10 NOTE — Progress Notes (Signed)
Walked in pts room, pt was on the phone with someone talking about leaving AMA. Pt upset because cardiologist put her on fluid restriction. Pt states doctor was " rude and doesn't want the cardiologist back in her room.

## 2022-11-10 NOTE — Progress Notes (Signed)
PHARMACY CONSULT NOTE - FOLLOW UP  Pharmacy Consult for Electrolyte Monitoring and Replacement   Recent Labs: Potassium (mmol/L)  Date Value  11/10/2022 3.0 (L)  06/06/2013 3.2 (L)   Magnesium (mg/dL)  Date Value  46/96/2952 1.6 (L)   Calcium (mg/dL)  Date Value  84/13/2440 9.2   Calcium, Total (mg/dL)  Date Value  04/01/2535 9.4   Albumin (g/dL)  Date Value  64/40/3474 3.8  06/06/2013 3.9   Phosphorus (mg/dL)  Date Value  25/95/6387 6.4 (H)   Sodium (mmol/L)  Date Value  11/10/2022 136  06/06/2013 139     Assessment: 6/7 @ 0003 :  K = 3.0                        Mag = 1.6  Goal of Therapy:  Electrolytes WNL   Plan:  KCl 40 mEq PO once given on 6/7 @ 0130. Additional KCl 10 mEq IV X 2 ordered for 6/7 @ ~ 0300.  Mag sulfate 2 gm IV X 1 given on 6/7 @ 0016.  Will recheck electrolytes on 6/7 with AM labs.   Scherrie Gerlach ,PharmD Clinical Pharmacist 11/10/2022 2:34 AM

## 2022-11-11 DIAGNOSIS — J9621 Acute and chronic respiratory failure with hypoxia: Secondary | ICD-10-CM | POA: Diagnosis not present

## 2022-11-11 DIAGNOSIS — J849 Interstitial pulmonary disease, unspecified: Secondary | ICD-10-CM | POA: Diagnosis not present

## 2022-11-11 DIAGNOSIS — I50810 Right heart failure, unspecified: Secondary | ICD-10-CM | POA: Diagnosis not present

## 2022-11-11 DIAGNOSIS — R0902 Hypoxemia: Secondary | ICD-10-CM

## 2022-11-11 DIAGNOSIS — I2723 Pulmonary hypertension due to lung diseases and hypoxia: Secondary | ICD-10-CM | POA: Diagnosis not present

## 2022-11-11 LAB — CULTURE, BLOOD (ROUTINE X 2)

## 2022-11-11 LAB — HEPATIC FUNCTION PANEL
ALT: 22 U/L (ref 0–44)
AST: 31 U/L (ref 15–41)
Albumin: 3.7 g/dL (ref 3.5–5.0)
Alkaline Phosphatase: 115 U/L (ref 38–126)
Bilirubin, Direct: 1.6 mg/dL — ABNORMAL HIGH (ref 0.0–0.2)
Indirect Bilirubin: 1.7 mg/dL — ABNORMAL HIGH (ref 0.3–0.9)
Total Bilirubin: 3.3 mg/dL — ABNORMAL HIGH (ref 0.3–1.2)
Total Protein: 7.9 g/dL (ref 6.5–8.1)

## 2022-11-11 LAB — BASIC METABOLIC PANEL
Anion gap: 13 (ref 5–15)
Anion gap: 12 (ref 5–15)
BUN: 26 mg/dL — ABNORMAL HIGH (ref 6–20)
BUN: 26 mg/dL — ABNORMAL HIGH (ref 6–20)
CO2: 23 mmol/L (ref 22–32)
CO2: 23 mmol/L (ref 22–32)
Calcium: 9.8 mg/dL (ref 8.9–10.3)
Calcium: 9.9 mg/dL (ref 8.9–10.3)
Chloride: 99 mmol/L (ref 98–111)
Chloride: 99 mmol/L (ref 98–111)
Creatinine, Ser: 1.28 mg/dL — ABNORMAL HIGH (ref 0.44–1.00)
Creatinine, Ser: 1.18 mg/dL — ABNORMAL HIGH (ref 0.44–1.00)
GFR, Estimated: 59 mL/min — ABNORMAL LOW (ref >60)
GFR, Estimated: >60 mL/min (ref >60)
Glucose, Bld: 200 mg/dL — ABNORMAL HIGH (ref 70–99)
Glucose, Bld: 106 mg/dL — ABNORMAL HIGH (ref 70–99)
Potassium: 4.2 mmol/L (ref 3.5–5.1)
Potassium: 4.3 mmol/L (ref 3.5–5.1)
Sodium: 135 mmol/L (ref 135–145)
Sodium: 134 mmol/L — ABNORMAL LOW (ref 135–145)

## 2022-11-11 LAB — PHOSPHORUS: Phosphorus: 3.7 mg/dL (ref 2.5–4.6)

## 2022-11-11 LAB — MAGNESIUM: Magnesium: 2.4 mg/dL (ref 1.7–2.4)

## 2022-11-11 LAB — GLUCOSE, CAPILLARY: Glucose-Capillary: 112 mg/dL — ABNORMAL HIGH (ref 70–99)

## 2022-11-11 MED ORDER — FUROSEMIDE 10 MG/ML IJ SOLN
80.0000 mg | Freq: Once | INTRAMUSCULAR | Status: AC
  Administered 2022-11-11: 80 mg via INTRAVENOUS
  Filled 2022-11-11: qty 8

## 2022-11-11 MED ORDER — FUROSEMIDE 10 MG/ML IJ SOLN: 120.0000 mg | Freq: Every day | INTRAVENOUS | Status: DC

## 2022-11-11 NOTE — Consult Note (Addendum)
PHARMACY CONSULT NOTE  Pharmacy Consult for Electrolyte Monitoring and Replacement   Recent Labs: Potassium (mmol/L)  Date Value  11/11/2022 4.2  06/06/2013 3.2 (L)   Magnesium (mg/dL)  Date Value  16/03/9603 2.4   Calcium (mg/dL)  Date Value  54/02/8118 9.8   Calcium, Total (mg/dL)  Date Value  14/78/2956 9.4   Albumin (g/dL)  Date Value  21/30/8657 3.8  06/06/2013 3.9   Phosphorus (mg/dL)  Date Value  84/69/6295 3.7   Sodium (mmol/L)  Date Value  11/11/2022 135  06/06/2013 139     Assessment: 27 yo female with PMH including HTN, HFpEF, pulmonary hypertension, Hx of PE, and chronic respiratory failure presented to the ED with respiratory distress.  Admitted for treatment of acute on chronic respiratory failure and acute on chronic CHF.  Goal of Therapy:  WNL  Plan:  No replacement indicated at this time Of note, patient receiving KCL BID scheduled Follow-up electrolytes with AM labs  Bettey Costa ,PharmD Clinical Pharmacist 11/11/2022 7:27 AM

## 2022-11-11 NOTE — Progress Notes (Signed)
NAME:  Yesenia Carrillo, MRN:  [id number], DOB:  [birth year], LOS: 1 ADMISSION DATE:  11/09/2022, CONSULTATION DATE:  11/10/22 REFERRING MD:  Chiquita Loth  CHIEF COMPLAINT:  SOB   HPI  Yesenia Carrillo is a 27 y.o. female who has a PMH of morbid obesity, HTN, HFpEF, pulmonary hypertension (group 2 and 3) on tyvaso and sildenafil, provoked PE on anticoagulation, prior splenic vein thrombosis with splenic infarcts, chronic idiopathic leukocytosis/thrombocytosis, sickle cell trait, interstitial lung disease NOS, not on immunosuppression, Chronic respiratory failure with hypoxia on oxygen 8-10L at home baseline, HSV infection,  Iron deficiency, and Polycythemia who presented to the ED with respiratory distress.  On review of her chart, patient was recently admitted at [facility] in the MICU unit on 10/23/2022 -10/27/2022 with acute on chronic hypoxemic respiratory failure secondary to pulmonary hypertension and volume overload. She was initially placed on high flow nasal cannula but was able to be transitioned to her home nasal cannula oxygen at 8 to 10 L with aggressive IV diuresis. Her torsemide was also increased to 60 mg twice daily with recommendations to follow up with pulmonary hypertension clinic.   ED Course: Initial vital signs showed HR of 135 beats/minute, BP 112/61 mm Hg, the RR 45 breaths/minute, and the oxygen saturation 95% on NRB and a temperature of 99.49F (37.2C). Patient was placed on BiPAP due to respiratory distress unfortunately she started vomiting 5 minutes later and was transitioned to 50L @ 100% HFNC.  Pertinent Labs/Diagnostics Findings: Na+/ K+: 136/3.0 Glucose: 969 BUN/Cr.:15/1.19 WBC:13.1 Hgb/Hct: 15.2/43.8  PCT: negative <0.10 Lactic acid:  COVID PCR: Negative, Troponin: 35 BNP: 716.1 CXR: No acute abnormality  Patient was treated with Duonebs, solumedrol 125 mg IV and mag sulfate 2g. Due to high risk for intubation, PCCM consulted for admission.  Past Medical History  morbid obesity,  HTN, HFpEF, pulmonary hypertension (group 2 and 3) on tyvaso and sildenafil, provoked PE on anticoagulation, prior splenic vein thrombosis with splenic infarcts, chronic idiopathic leukocytosis/thrombocytosis, sickle cell trait, interstitial lung disease NOS, not on immunosuppression, Chronic respiratory failure with hypoxia on oxygen 8-10L at home baseline, HSV infection,  Iron deficiency, and Polycythemia  Significant Hospital Events   6/7: Admitted to ICU with acute on chronic hypoxemic respiratory failure 6/8:   Consults:  Cardiology  Procedures:  None  Significant Diagnostic Tests:  6/7: Chest Xray> no acute cardiopulmonary process  Interim History / Subjective:  -remains on HFNC  Micro Data:  6/7: SARS-CoV-2 PCR> negative 6/7: Influenza PCR> negative 6/7: Blood culture x2>negative  6/7: MRSA PCR>> negative  6/7: Respiratory panel (~20 pathogens)>rhinovirus   Antimicrobials:  None  OBJECTIVE  Blood pressure 113/79, pulse (!) 109, temperature (!) 97 F (36.1 C), resp. rate (!) 25, height 5\' 1"  (1.549 m), weight 107.9 kg, SpO2 96 %, unknown if currently breastfeeding.    FiO2 (%):  [60 %-65 %] 65 %   Intake/Output Summary (Last 24 hours) at 11/11/2022 1028 Last data filed at 11/11/2022 0402 Gross per 24 hour  Intake 1139.97 ml  Output 1700 ml  Net -560.03 ml    Filed Weights   11/09/22 2356 11/10/22 1247 11/11/22 0500  Weight: 109 kg 107.7 kg 107.9 kg   Physical Examination  GENERAL: Acute on chronically-ill obese, female, NAD on HHFNC  HEENT: Head atraumatic, normocephalic. Oropharynx and nasopharynx clear.  NECK:  No JVD, supple  LUNGS: Faint crackles throughout, even, non labored  CARDIOVASCULAR: Sinus tachycardia, s1s2, no m/r/g, 2+ radial/2+ distal pulses, no edema  ABDOMEN: +BS x4, obese, soft, non tender  EXTREMITIES: Normal bulk and tone, moves all extremities  NEUROLOGIC: Alert and oriented, following commands, PERRLA   SKIN: No obvious rash, lesion, or  ulcer. Warm to touch Labs/imaging that I havepersonally reviewed  (right click and "Reselect all SmartList Selections" daily)     Labs   CBC: Recent Labs  Lab 11/10/22 0003 11/10/22 0507  WBC 13.1* 12.2*  NEUTROABS 7.6  --   HGB 15.2* 15.2*  HCT 43.8 44.6  MCV 79.3* 78.8*  PLT 342 346     Basic Metabolic Panel: Recent Labs  Lab 11/10/22 0003 11/10/22 0507 11/11/22 0449  NA 136 136 135  K 3.0* 3.7 4.2  CL 99 103 99  CO2 23 20* 23  GLUCOSE 96 180* 200*  BUN 15 17 26*  CREATININE 1.19* 1.04* 1.28*  CALCIUM 9.2 9.4 9.8  MG 1.6* 2.3 2.4  PHOS  --  3.0 3.7    GFR: Estimated Creatinine Clearance: 75.5 mL/min (A) (by C-G formula based on SCr of 1.28 mg/dL (H)). Recent Labs  Lab 11/10/22 0003 11/10/22 0507 11/10/22 0724 11/10/22 1252  PROCALCITON  --  <0.10  --   --   WBC 13.1* 12.2*  --   --   LATICACIDVEN  --  2.3* 2.5* 2.0*     Liver Function Tests: Recent Labs  Lab 11/10/22 0003  AST 31  ALT 19  ALKPHOS 138*  BILITOT 3.8*  PROT 7.7  ALBUMIN 3.8    No results for input(s): "LIPASE", "AMYLASE" in the last 168 hours. No results for input(s): "AMMONIA" in the last 168 hours.  ABG    Component Value Date/Time   PHART 7.44 10/29/2019 1224   PCO2ART 37 10/29/2019 1224   PO2ART 60 (L) 10/29/2019 1224   HCO3 27.0 10/12/2022 1307   TCO2 28 10/12/2022 1011   ACIDBASEDEF 0.6 10/30/2019 0649   O2SAT 62.6 10/17/2022 0447     Coagulation Profile: No results for input(s): "INR", "PROTIME" in the last 168 hours.  Cardiac Enzymes: No results for input(s): "CKTOTAL", "CKMB", "CKMBINDEX", "TROPONINI" in the last 168 hours.  HbA1C: Hgb A1c MFr Bld  Date/Time Value Ref Range Status  08/09/2022 08:53 AM 6.1 (H) 4.8 - 5.6 % Final    Comment:    (NOTE)         Prediabetes: 5.7 - 6.4         Diabetes: >6.4         Glycemic control for adults with diabetes: <7.0   05/24/2022 03:02 AM 6.2 (H) 4.8 - 5.6 % Final    Comment:    (NOTE)         Prediabetes:  5.7 - 6.4         Diabetes: >6.4         Glycemic control for adults with diabetes: <7.0     CBG: Recent Labs  Lab 11/10/22 1238  GLUCAP 94    Review of Systems: Positives in BOLD   Gen: Denies fever, chills, weight change, fatigue, night sweats HEENT: Denies blurred vision, double vision, hearing loss, tinnitus, sinus congestion, rhinorrhea, sore throat, neck stiffness, dysphagia PULM: shortness of breath, cough, sputum production, hemoptysis, wheezing CV: Denies chest pain, edema, orthopnea, paroxysmal nocturnal dyspnea, palpitations GI: Denies abdominal pain, nausea, vomiting, diarrhea, hematochezia, melena, constipation, change in bowel habits GU: Denies dysuria, hematuria, polyuria, oliguria, urethral discharge Endocrine: Denies hot or cold intolerance, polyuria, polyphagia or appetite change Derm: Denies rash, dry skin, scaling or peeling  skin change Heme: Denies easy bruising, bleeding, bleeding gums Neuro: Denies headache, numbness, weakness, slurred speech, loss of memory or consciousness  Past Medical History  She,  has a past medical history of CHF (congestive heart failure) (HCC), COVID-19 (04/2019), Hypertension, Interstitial lung disease (HCC), Morbid obesity with BMI of 40.0-44.9, adult (HCC), and Pre-eclampsia.   Surgical History    Past Surgical History:  Procedure Laterality Date   ECMO CANNULATION     RIGHT HEART CATH N/A 10/12/2022   Procedure: RIGHT HEART CATH;  Surgeon: Laurey Morale, MD;  Location: [facility] INVASIVE CV LAB;  Service: Cardiovascular;  Laterality: N/A;   TONSILLECTOMY     WISDOM TOOTH EXTRACTION       Social History   reports that she quit smoking about 6 years ago. Her smoking use included cigarettes. She has never used smokeless tobacco. She reports that she does not currently use alcohol. She reports that she does not use drugs.   Family History   Her family history includes Healthy in her father and mother.   Allergies Allergies   Allergen Reactions   Ampicillin Rash and Other (See Comments)    Patient and family cannot remember the specifics of the reaction.   Penicillins Hives    Previously tolerated cefazolin and cefepime 05/03/18     Home Medications  Prior to Admission medications   Medication Sig Start Date End Date Taking? Authorizing Provider  albuterol (VENTOLIN HFA) 108 (90 Base) MCG/ACT inhaler Inhale 1-2 puffs into the lungs every 6 (six) hours as needed for wheezing or shortness of breath. 07/25/21  Yes [provider]  digoxin (LANOXIN) 0.125 MG tablet Take 1 tablet (0.125 mg total) by mouth daily. 10/18/22  Yes Enedina Finner, MD  ELIQUIS 5 MG TABS tablet Take 5 mg by mouth 2 (two) times daily.   Yes [provider]  famotidine (PEPCID) 20 MG tablet Take 20 mg by mouth 2 (two) times daily.   Yes [provider]  ipratropium-albuterol (DUONEB) 0.5-2.5 (3) MG/3ML SOLN Take 3 mLs by nebulization every 6 (six) hours as needed. 09/29/22  Yes Darlin Priestly, MD  midodrine (PROAMATINE) 5 MG tablet Take 1 tablet (5 mg total) by mouth 3 (three) times daily with meals. 10/17/22  Yes Enedina Finner, MD  sildenafil (REVATIO) 20 MG tablet Take 20 mg by mouth 3 (three) times daily. 04/19/22  Yes [provider]  spironolactone (ALDACTONE) 25 MG tablet Take 25 mg by mouth daily.   Yes [provider]  torsemide (DEMADEX) 20 MG tablet Take 2 tablets (40 mg total) by mouth 2 (two) times daily. 10/17/22 10/17/23 Yes Enedina Finner, MD  TYVASO REFILL 0.6 MG/ML SOLN Inhale 18 mcg into the lungs every 4 (four) hours while awake. 11/30/21  Yes [provider]  calcium carbonate (TUMS - DOSED IN MG ELEMENTAL CALCIUM) 500 MG chewable tablet Chew 1 tablet (200 mg of elemental calcium total) by mouth 3 (three) times daily as needed for indigestion or heartburn. Patient not taking: Reported on 11/10/2022 10/17/22   Enedina Finner, MD  Fluticasone-Umeclidin-Vilant (TRELEGY ELLIPTA) 200-62.5-25 MCG/ACT  AEPB Inhale 1 puff into the lungs daily. Patient not taking: Reported on 11/10/2022    [provider]  hydrocortisone cream 1 % Apply topically as needed for itching. Patient not taking: Reported on 11/10/2022 10/17/22   Enedina Finner, MD  lidocaine (XYLOCAINE) 2 % jelly Apply 1 Application topically daily as needed (vaginal itching). Patient not taking: Reported on 11/10/2022 10/17/22   Enedina Finner,  MD  Scheduled Meds:  apixaban  5 mg Oral BID   Chlorhexidine Gluconate Cloth  6 each Topical Daily   potassium chloride  40 mEq Oral BID   sildenafil  20 mg Oral TID   Treprostinil  18 mcg Inhalation Q4H while awake   Continuous Infusions:   PRN Meds:.acetaminophen, docusate sodium, ondansetron (ZOFRAN) IV, polyethylene glycol  Active Hospital Problem list   See below  Assessment & Plan:  #Acute on chronic respiratory failure on oxygen 8-10L at home baseline #Rhinovirus  #ILD  #Pulmonary Hypertension Hx: OSA  - Supplemental O2 for dyspnea and/or hypoxia  - Maintain O2 sats 92% or higher  - Continue sildenafil  - Spoke with pt requested she gets someone to bring her Tyvaso 24 mcg from home to be administered every 4 hours while awake - Fluid restriction  - Daily weights - Cardiology consulted appreciate input   #Acute on Chronic HFpEF #Mildly elevated troponin  likely secondary to demand ischemia  Hx: PE  - Continuous telemetry monitoring  - Continue outpatient apixaban  - Hold furosemide, spironolactone, and metolazone for now due to AKI  - Cardiology consult   #Leukocytosis secondary to rhinovirus  - Trend WBC and monitor fever curve  - Follow cultures  - No indication for abx at this time   #AKI likely ATN in the setting of above~worsening  #Hypokalemia~resolved  - Trend BMP  - Replace electrolytes as indicated  - Monitor UOP  - Hold furosemide, spironolactone, and metolazone for now.  Will repeat BMP at noon today if renal function improved will give a dose of iv  lasix   #Nausea/Vomiting~resolved   #Elevated Bilirubin likely related to hepatic congestion from RHF - Trend hepatic function panel   #Endo - CBG's qshift   Best practice:  Diet:  Oral Pain/Anxiety/Delirium protocol (if indicated): No VAP protocol (if indicated): Not indicated DVT prophylaxis: Systemic AC GI prophylaxis: N/A Glucose control:  SSI No Central venous access:  N/A Arterial line:  N/A Foley:  N/A Mobility:  bed rest  PT consulted: N/A Last date of multidisciplinary goals of care discussion [11/11/22] Code Status:  full code Disposition: ICU  06/8: Pt updated regarding current plan of care and all questions answered  Critical care time: 40 minutes     Yesenia Carrillo, AGNP  Pulmonary/Critical Care Pager [phone] (please enter 7 digits) PCCM Consult Pager [phone] (please enter 7 digits)

## 2022-11-12 ENCOUNTER — Inpatient Hospital Stay: Payer: Medicaid Other

## 2022-11-12 DIAGNOSIS — J849 Interstitial pulmonary disease, unspecified: Secondary | ICD-10-CM | POA: Diagnosis not present

## 2022-11-12 DIAGNOSIS — I50810 Right heart failure, unspecified: Secondary | ICD-10-CM | POA: Diagnosis not present

## 2022-11-12 DIAGNOSIS — J9621 Acute and chronic respiratory failure with hypoxia: Secondary | ICD-10-CM | POA: Diagnosis not present

## 2022-11-12 DIAGNOSIS — I2723 Pulmonary hypertension due to lung diseases and hypoxia: Secondary | ICD-10-CM | POA: Diagnosis not present

## 2022-11-12 LAB — COMPREHENSIVE METABOLIC PANEL
ALT: 24 U/L (ref 0–44)
AST: 28 U/L (ref 15–41)
Albumin: 4.1 g/dL (ref 3.5–5.0)
Alkaline Phosphatase: 111 U/L (ref 38–126)
Anion gap: 13 (ref 5–15)
BUN: 31 mg/dL — ABNORMAL HIGH (ref 6–20)
CO2: 27 mmol/L (ref 22–32)
Calcium: 9.3 mg/dL (ref 8.9–10.3)
Chloride: 95 mmol/L — ABNORMAL LOW (ref 98–111)
Creatinine, Ser: 1.33 mg/dL — ABNORMAL HIGH (ref 0.44–1.00)
GFR, Estimated: 57 mL/min — ABNORMAL LOW (ref >60)
Glucose, Bld: 107 mg/dL — ABNORMAL HIGH (ref 70–99)
Potassium: 4.1 mmol/L (ref 3.5–5.1)
Sodium: 135 mmol/L (ref 135–145)
Total Bilirubin: 3.2 mg/dL — ABNORMAL HIGH (ref 0.3–1.2)
Total Protein: 8.1 g/dL (ref 6.5–8.1)

## 2022-11-12 LAB — CBC WITH DIFFERENTIAL/PLATELET
Abs Immature Granulocytes: 0.09 10*3/uL — ABNORMAL HIGH (ref 0.00–0.07)
Basophils Absolute: 0 10*3/uL (ref 0.0–0.1)
Basophils Relative: 0 %
Eosinophils Absolute: 0 10*3/uL (ref 0.0–0.5)
Eosinophils Relative: 0 %
HCT: 44.4 % (ref 36.0–46.0)
Hemoglobin: 15.5 g/dL — ABNORMAL HIGH (ref 12.0–15.0)
Immature Granulocytes: 0 %
Lymphocytes Relative: 20 %
Lymphs Abs: 4.4 10*3/uL — ABNORMAL HIGH (ref 0.7–4.0)
MCH: 27.1 pg (ref 26.0–34.0)
MCHC: 34.9 g/dL (ref 30.0–36.0)
MCV: 77.8 fL — ABNORMAL LOW (ref 80.0–100.0)
Monocytes Absolute: 1.8 10*3/uL — ABNORMAL HIGH (ref 0.1–1.0)
Monocytes Relative: 8 %
Neutro Abs: 16.1 10*3/uL — ABNORMAL HIGH (ref 1.7–7.7)
Neutrophils Relative %: 72 %
Platelets: 341 10*3/uL (ref 150–400)
RBC: 5.71 MIL/uL — ABNORMAL HIGH (ref 3.87–5.11)
RDW: 22.4 % — ABNORMAL HIGH (ref 11.5–15.5)
Smear Review: NORMAL
WBC: 22.4 10*3/uL — ABNORMAL HIGH (ref 4.0–10.5)
nRBC: 0.7 % — ABNORMAL HIGH (ref 0.0–0.2)

## 2022-11-12 LAB — GLUCOSE, CAPILLARY
Glucose-Capillary: 112 mg/dL — ABNORMAL HIGH (ref 70–99)
Glucose-Capillary: 86 mg/dL (ref 70–99)

## 2022-11-12 LAB — BASIC METABOLIC PANEL
Anion gap: 12 (ref 5–15)
BUN: 35 mg/dL — ABNORMAL HIGH (ref 6–20)
CO2: 25 mmol/L (ref 22–32)
Calcium: 9.4 mg/dL (ref 8.9–10.3)
Chloride: 99 mmol/L (ref 98–111)
Creatinine, Ser: 1.1 mg/dL — ABNORMAL HIGH (ref 0.44–1.00)
GFR, Estimated: >60 mL/min (ref >60)
Glucose, Bld: 96 mg/dL (ref 70–99)
Potassium: 3.9 mmol/L (ref 3.5–5.1)
Sodium: 136 mmol/L (ref 135–145)

## 2022-11-12 LAB — PHOSPHORUS: Phosphorus: 5 mg/dL — ABNORMAL HIGH (ref 2.5–4.6)

## 2022-11-12 LAB — PROCALCITONIN: Procalcitonin: <0.1 ng/mL

## 2022-11-12 LAB — CULTURE, BLOOD (ROUTINE X 2): Culture: NO GROWTH

## 2022-11-12 LAB — MAGNESIUM: Magnesium: 2.4 mg/dL (ref 1.7–2.4)

## 2022-11-12 NOTE — Progress Notes (Signed)
NAME:  Yesenia Carrillo, MRN:  [id number], DOB:  Oct 25, 1995, LOS: 2 ADMISSION DATE:  11/09/2022, CONSULTATION DATE:  11/10/22 REFERRING MD:  Chiquita Loth  CHIEF COMPLAINT:  SOB   HPI  Yesenia Carrillo is a 27 y.o. female who has a PMH of morbid obesity, HTN, HFpEF, pulmonary hypertension (group 2 and 3) on tyvaso and sildenafil, provoked PE on anticoagulation, prior splenic vein thrombosis with splenic infarcts, chronic idiopathic leukocytosis/thrombocytosis, sickle cell trait, interstitial lung disease NOS, not on immunosuppression, Chronic respiratory failure with hypoxia on oxygen 8-10L at home baseline, HSV infection,  Iron deficiency, and Polycythemia who presented to the ED with respiratory distress.  On review of her chart, patient was recently admitted at [facility] in the MICU unit on 10/23/2022 -10/27/2022 with acute on chronic hypoxemic respiratory failure secondary to pulmonary hypertension and volume overload. She was initially placed on high flow nasal cannula but was able to be transitioned to her home nasal cannula oxygen at 8 to 10 L with aggressive IV diuresis. Her torsemide was also increased to 60 mg twice daily with recommendations to follow up with pulmonary hypertension clinic.   ED Course: Initial vital signs showed HR of 135 beats/minute, BP 112/61 mm Hg, the RR 45 breaths/minute, and the oxygen saturation 95% on NRB and a temperature of 99.83F (37.2C). Patient was placed on BiPAP due to respiratory distress unfortunately she started vomiting 5 minutes later and was transitioned to 50L @ 100% HFNC.  Pertinent Labs/Diagnostics Findings: Na+/ K+: 136/3.0 Glucose: 969 BUN/Cr.:15/1.19 WBC:13.1 Hgb/Hct: 15.2/43.8  PCT: negative <0.10 Lactic acid:  COVID PCR: Negative, Troponin: 35 BNP: 716.1 CXR: No acute abnormality  Patient was treated with Duonebs, solumedrol 125 mg IV and mag sulfate 2g. Due to high risk for intubation, PCCM consulted for admission.  Past Medical History  morbid obesity,  HTN, HFpEF, pulmonary hypertension (group 2 and 3) on tyvaso and sildenafil, provoked PE on anticoagulation, prior splenic vein thrombosis with splenic infarcts, chronic idiopathic leukocytosis/thrombocytosis, sickle cell trait, interstitial lung disease NOS, not on immunosuppression, Chronic respiratory failure with hypoxia on oxygen 8-10L at home baseline, HSV infection,  Iron deficiency, and Polycythemia  Significant Hospital Events   6/7: Admitted to ICU with acute on chronic hypoxemic respiratory failure 6/8: Pt remains on HHFNC @59 %/45L.  Pt now has Tyvaso at bedside will start today and hold lasix due to worsening renal function   Consults:  Cardiology  Procedures:  None  Significant Diagnostic Tests:  6/7: Chest Xray> no acute cardiopulmonary process  Interim History / Subjective:  As outlined above under significant events above   Micro Data:  6/7: SARS-CoV-2 PCR> negative 6/7: Influenza PCR> negative 6/7: Blood culture x2>negative  6/7: MRSA PCR>> negative  6/7: Respiratory panel (~20 pathogens)>rhinovirus   Antimicrobials:  None  OBJECTIVE  Blood pressure 103/74, pulse 97, temperature 97.8 F (36.6 C), resp. rate (!) 27, height 5\' 1"  (1.549 m), weight 107.3 kg, SpO2 93 %, unknown if currently breastfeeding.    FiO2 (%):  [60 %] 60 %   Intake/Output Summary (Last 24 hours) at 11/12/2022 0740 Last data filed at 11/12/2022 0700 Gross per 24 hour  Intake --  Output 2400 ml  Net -2400 ml   Filed Weights   11/10/22 1247 11/11/22 0500 11/12/22 0440  Weight: 107.7 kg 107.9 kg 107.3 kg   Physical Examination  GENERAL: Acute on chronically-ill obese, female, NAD on HHFNC  HEENT: Head atraumatic, normocephalic. Oropharynx and nasopharynx clear.  NECK:  No JVD, supple  LUNGS: Diminished throughout, even, non labored  CARDIOVASCULAR: NSR, s1s2, no m/r/g, 2+ radial/2+ distal pulses, no edema   ABDOMEN: +BS x4, obese, soft, non tender  EXTREMITIES: Normal bulk and tone,  moves all extremities  NEUROLOGIC: Alert and oriented, following commands, PERRLA   SKIN: No obvious rash, lesion, or ulcer. Warm to touch Labs/imaging that I havepersonally reviewed  (right click and "Reselect all SmartList Selections" daily)     Labs   CBC: Recent Labs  Lab 11/10/22 0003 11/10/22 0507 11/12/22 0628  WBC 13.1* 12.2* 22.4*  NEUTROABS 7.6  --  16.1*  HGB 15.2* 15.2* 15.5*  HCT 43.8 44.6 44.4  MCV 79.3* 78.8* 77.8*  PLT 342 346 341    Basic Metabolic Panel: Recent Labs  Lab 11/10/22 0003 11/10/22 0507 11/11/22 0449 11/11/22 1352 11/12/22 0628  NA 136 136 135 134* 135  K 3.0* 3.7 4.2 4.3 4.1  CL 99 103 99 99 95*  CO2 23 20* 23 23 27   GLUCOSE 96 180* 200* 106* 107*  BUN 15 17 26* 26* 31*  CREATININE 1.19* 1.04* 1.28* 1.18* 1.33*  CALCIUM 9.2 9.4 9.8 9.9 9.3  MG 1.6* 2.3 2.4  --  2.4  PHOS  --  3.0 3.7  --  5.0*   GFR: Estimated Creatinine Clearance: 72.5 mL/min (A) (by C-G formula based on SCr of 1.33 mg/dL (H)). Recent Labs  Lab 11/10/22 0003 11/10/22 0507 11/10/22 0724 11/10/22 1252 11/12/22 0628  PROCALCITON  --  <0.10  --   --  <0.10  WBC 13.1* 12.2*  --   --  22.4*  LATICACIDVEN  --  2.3* 2.5* 2.0*  --     Liver Function Tests: Recent Labs  Lab 11/10/22 0003 11/11/22 0446 11/12/22 0628  AST 31 31 28   ALT 19 22 24   ALKPHOS 138* 115 111  BILITOT 3.8* 3.3* 3.2*  PROT 7.7 7.9 8.1  ALBUMIN 3.8 3.7 4.1   No results for input(s): "LIPASE", "AMYLASE" in the last 168 hours. No results for input(s): "AMMONIA" in the last 168 hours.  ABG    Component Value Date/Time   PHART 7.44 10/29/2019 1224   PCO2ART 37 10/29/2019 1224   PO2ART 60 (L) 10/29/2019 1224   HCO3 27.0 10/12/2022 1307   TCO2 28 10/12/2022 1011   ACIDBASEDEF 0.6 10/30/2019 0649   O2SAT 62.6 10/17/2022 0447     Coagulation Profile: No results for input(s): "INR", "PROTIME" in the last 168 hours.  Cardiac Enzymes: No results for input(s): "CKTOTAL", "CKMB",  "CKMBINDEX", "TROPONINI" in the last 168 hours.  HbA1C: Hgb A1c MFr Bld  Date/Time Value Ref Range Status  08/09/2022 08:53 AM 6.1 (H) 4.8 - 5.6 % Final    Comment:    (NOTE)         Prediabetes: 5.7 - 6.4         Diabetes: >6.4         Glycemic control for adults with diabetes: <7.0   05/24/2022 03:02 AM 6.2 (H) 4.8 - 5.6 % Final    Comment:    (NOTE)         Prediabetes: 5.7 - 6.4         Diabetes: >6.4         Glycemic control for adults with diabetes: <7.0     CBG: Recent Labs  Lab 11/10/22 1238 11/11/22 1954 11/12/22 0730  GLUCAP 94 112* 112*    Review of Systems: Positives in BOLD   Gen: Denies fever, chills, weight  change, fatigue, night sweats HEENT: Denies blurred vision, double vision, hearing loss, tinnitus, sinus congestion, rhinorrhea, sore throat, neck stiffness, dysphagia PULM: shortness of breath, cough, sputum production, hemoptysis, wheezing CV: Denies chest pain, edema, orthopnea, paroxysmal nocturnal dyspnea, palpitations GI: Denies abdominal pain, nausea, vomiting, diarrhea, hematochezia, melena, constipation, change in bowel habits GU: Denies dysuria, hematuria, polyuria, oliguria, urethral discharge Endocrine: Denies hot or cold intolerance, polyuria, polyphagia or appetite change Derm: Denies rash, dry skin, scaling or peeling skin change Heme: Denies easy bruising, bleeding, bleeding gums Neuro: Denies headache, numbness, weakness, slurred speech, loss of memory or consciousness  Past Medical History  She,  has a past medical history of CHF (congestive heart failure) (HCC), COVID-19 (04/2019), Hypertension, Interstitial lung disease (HCC), Morbid obesity with BMI of 40.0-44.9, adult (HCC), and Pre-eclampsia.   Surgical History    Past Surgical History:  Procedure Laterality Date   ECMO CANNULATION     RIGHT HEART CATH N/A 10/12/2022   Procedure: RIGHT HEART CATH;  Surgeon: Laurey Morale, MD;  Location: [facility] INVASIVE CV LAB;  Service:  Cardiovascular;  Laterality: N/A;   TONSILLECTOMY     WISDOM TOOTH EXTRACTION       Social History   reports that she quit smoking about 6 years ago. Her smoking use included cigarettes. She has never used smokeless tobacco. She reports that she does not currently use alcohol. She reports that she does not use drugs.   Family History   Her family history includes Healthy in her father and mother.   Allergies Allergies  Allergen Reactions   Ampicillin Rash and Other (See Comments)    Patient and family cannot remember the specifics of the reaction.   Penicillins Hives    Previously tolerated cefazolin and cefepime 05/03/18     Home Medications  Prior to Admission medications   Medication Sig Start Date End Date Taking? Authorizing Provider  albuterol (VENTOLIN HFA) 108 (90 Base) MCG/ACT inhaler Inhale 1-2 puffs into the lungs every 6 (six) hours as needed for wheezing or shortness of breath. 07/25/21  Yes [provider]  digoxin (LANOXIN) 0.125 MG tablet Take 1 tablet (0.125 mg total) by mouth daily. 10/18/22  Yes Enedina Finner, MD  ELIQUIS 5 MG TABS tablet Take 5 mg by mouth 2 (two) times daily.   Yes [provider]  famotidine (PEPCID) 20 MG tablet Take 20 mg by mouth 2 (two) times daily.   Yes [provider]  ipratropium-albuterol (DUONEB) 0.5-2.5 (3) MG/3ML SOLN Take 3 mLs by nebulization every 6 (six) hours as needed. 09/29/22  Yes Darlin Priestly, MD  midodrine (PROAMATINE) 5 MG tablet Take 1 tablet (5 mg total) by mouth 3 (three) times daily with meals. 10/17/22  Yes Enedina Finner, MD  sildenafil (REVATIO) 20 MG tablet Take 20 mg by mouth 3 (three) times daily. 04/19/22  Yes [provider]  spironolactone (ALDACTONE) 25 MG tablet Take 25 mg by mouth daily.   Yes [provider]  torsemide (DEMADEX) 20 MG tablet Take 2 tablets (40 mg total) by mouth 2 (two) times daily. 10/17/22 10/17/23 Yes Enedina Finner, MD  TYVASO REFILL 0.6 MG/ML SOLN Inhale 18  mcg into the lungs every 4 (four) hours while awake. 11/30/21  Yes [provider]  calcium carbonate (TUMS - DOSED IN MG ELEMENTAL CALCIUM) 500 MG chewable tablet Chew 1 tablet (200 mg of elemental calcium total) by mouth 3 (three) times daily as needed for indigestion or heartburn. Patient not taking: Reported on 11/10/2022  10/17/22   Enedina Finner, MD  Fluticasone-Umeclidin-Vilant (TRELEGY ELLIPTA) 200-62.5-25 MCG/ACT AEPB Inhale 1 puff into the lungs daily. Patient not taking: Reported on 11/10/2022    [provider]  hydrocortisone cream 1 % Apply topically as needed for itching. Patient not taking: Reported on 11/10/2022 10/17/22   Enedina Finner, MD  lidocaine (XYLOCAINE) 2 % jelly Apply 1 Application topically daily as needed (vaginal itching). Patient not taking: Reported on 11/10/2022 10/17/22   Enedina Finner, MD  Scheduled Meds:  apixaban  5 mg Oral BID   Chlorhexidine Gluconate Cloth  6 each Topical Daily   potassium chloride  40 mEq Oral BID   sildenafil  20 mg Oral TID   Treprostinil  18 mcg Inhalation Q4H while awake   Continuous Infusions:   PRN Meds:.acetaminophen, docusate sodium, ondansetron (ZOFRAN) IV, polyethylene glycol  Active Hospital Problem list   See below  Assessment & Plan:  #Acute on chronic respiratory failure on oxygen 8-10L at home baseline #Rhinovirus  #ILD  #Pulmonary Hypertension Hx: OSA  - Supplemental O2 for dyspnea and/or hypoxia  - Maintain O2 sats 92% or higher  - Continue sildenafil  - Tyvaso 24 mcg from home now at bedside to be administered every 4 hours while awake - Fluid restriction  - Daily weights - Cardiology consulted appreciate input   #Acute on Chronic HFpEF #Mildly elevated troponin  likely secondary to demand ischemia  Hx: PE  - Continuous telemetry monitoring  - Continue outpatient apixaban  - Hold furosemide, spironolactone, and metolazone for now due to AKI  - Cardiology consult   #Leukocytosis secondary to  rhinovirus  - Trend WBC and monitor fever curve  - Follow cultures  - No indication for abx at this time pct remains negative   #AKI likely ATN in the setting of above~worsening  #Hypokalemia~resolved  - Trend BMP  - Replace electrolytes as indicated  - Monitor UOP  - Hold furosemide, spironolactone, and metolazone for now.  Will repeat BMP at noon today  #Nausea/Vomiting~resolved   #Elevated Bilirubin likely related to hepatic congestion from RHF - Trend hepatic function panel   #Endo - CBG's qshift   Best practice:  Diet:  Oral Pain/Anxiety/Delirium protocol (if indicated): No VAP protocol (if indicated): Not indicated DVT prophylaxis: Systemic AC GI prophylaxis: N/A Glucose control:  SSI No Central venous access:  N/A Arterial line:  N/A Foley:  N/A Mobility: Activity as tolerated  PT consulted: Yes  Last date of multidisciplinary goals of care discussion [11/12/22] Code Status:  full code Disposition: ICU  06/9: Pt updated regarding current plan of care and all questions answered  Critical care time: 38 minutes     Zada Girt, AGNP  Pulmonary/Critical Care Pager [phone] (please enter 7 digits) PCCM Consult Pager [phone] (please enter 7 digits)

## 2022-11-12 NOTE — Progress Notes (Signed)
PT Cancellation Note  Patient Details Name: Yesenia Carrillo MRN: [id number] DOB: 08/16/1995   Cancelled Treatment:    Reason Eval/Treat Not Completed: Patient declined, no reason specified;Fatigue/lethargy limiting ability to participate. Pt initially asleep in bed upon PT arrival; however, easily aroused to auditory stimuli. She was able to respond "yes/no" with head gestures. She reported that she was not having any pain currently but not willing to participate in any mobility at this time. PT will continue to follow-up with pt acutely as available and appropriate.    Alessandra Bevels Jordane Hisle 11/12/2022, 11:03 AM

## 2022-11-12 NOTE — Consult Note (Signed)
PHARMACY CONSULT NOTE  Pharmacy Consult for Electrolyte Monitoring and Replacement   Recent Labs: Potassium (mmol/L)  Date Value  11/12/2022 4.1  06/06/2013 3.2 (L)   Magnesium (mg/dL)  Date Value  40/98/1191 2.4   Calcium (mg/dL)  Date Value  47/82/9562 9.3   Calcium, Total (mg/dL)  Date Value  13/01/6577 9.4   Albumin (g/dL)  Date Value  46/96/2952 4.1  06/06/2013 3.9   Phosphorus (mg/dL)  Date Value  84/13/2440 5.0 (H)   Sodium (mmol/L)  Date Value  11/12/2022 135  06/06/2013 139     Assessment: 27 yo female with PMH including HTN, HFpEF, pulmonary hypertension, Hx of PE, and chronic respiratory failure presented to the ED with respiratory distress.  Admitted for treatment of acute on chronic respiratory failure and acute on chronic CHF.  Goal of Therapy:  WNL  Plan:  No replacement indicated at this time Of note, patient receiving KCL BID scheduled Follow-up electrolytes with AM labs  Bettey Costa ,PharmD Clinical Pharmacist 11/12/2022 7:36 AM

## 2022-11-13 DIAGNOSIS — J849 Interstitial pulmonary disease, unspecified: Secondary | ICD-10-CM | POA: Diagnosis not present

## 2022-11-13 DIAGNOSIS — J9621 Acute and chronic respiratory failure with hypoxia: Secondary | ICD-10-CM | POA: Diagnosis not present

## 2022-11-13 DIAGNOSIS — Z6841 Body Mass Index (BMI) 40.0 and over, adult: Secondary | ICD-10-CM

## 2022-11-13 DIAGNOSIS — E876 Hypokalemia: Secondary | ICD-10-CM

## 2022-11-13 DIAGNOSIS — I50810 Right heart failure, unspecified: Secondary | ICD-10-CM | POA: Diagnosis not present

## 2022-11-13 DIAGNOSIS — Z86711 Personal history of pulmonary embolism: Secondary | ICD-10-CM

## 2022-11-13 DIAGNOSIS — R7989 Other specified abnormal findings of blood chemistry: Secondary | ICD-10-CM

## 2022-11-13 DIAGNOSIS — I2723 Pulmonary hypertension due to lung diseases and hypoxia: Secondary | ICD-10-CM | POA: Diagnosis not present

## 2022-11-13 LAB — CBC WITH DIFFERENTIAL/PLATELET
Abs Immature Granulocytes: 0.06 10*3/uL (ref 0.00–0.07)
Basophils Absolute: 0 10*3/uL (ref 0.0–0.1)
Basophils Relative: 0 %
Eosinophils Absolute: 0.1 10*3/uL (ref 0.0–0.5)
Eosinophils Relative: 1 %
HCT: 43 % (ref 36.0–46.0)
Hemoglobin: 15 g/dL (ref 12.0–15.0)
Immature Granulocytes: 0 %
Lymphocytes Relative: 35 %
Lymphs Abs: 5.5 10*3/uL — ABNORMAL HIGH (ref 0.7–4.0)
MCH: 27 pg (ref 26.0–34.0)
MCHC: 34.9 g/dL (ref 30.0–36.0)
MCV: 77.5 fL — ABNORMAL LOW (ref 80.0–100.0)
Monocytes Absolute: 1.6 10*3/uL — ABNORMAL HIGH (ref 0.1–1.0)
Monocytes Relative: 10 %
Neutro Abs: 8.5 10*3/uL — ABNORMAL HIGH (ref 1.7–7.7)
Neutrophils Relative %: 54 %
Platelets: 344 10*3/uL (ref 150–400)
RBC: 5.55 MIL/uL — ABNORMAL HIGH (ref 3.87–5.11)
RDW: 22.2 % — ABNORMAL HIGH (ref 11.5–15.5)
Smear Review: NORMAL
WBC: 15.7 10*3/uL — ABNORMAL HIGH (ref 4.0–10.5)
nRBC: 1.7 % — ABNORMAL HIGH (ref 0.0–0.2)

## 2022-11-13 LAB — CULTURE, BLOOD (ROUTINE X 2): Culture: NO GROWTH

## 2022-11-13 LAB — RENAL FUNCTION PANEL
Albumin: 3.9 g/dL (ref 3.5–5.0)
Anion gap: 10 (ref 5–15)
BUN: 34 mg/dL — ABNORMAL HIGH (ref 6–20)
CO2: 26 mmol/L (ref 22–32)
Calcium: 9.2 mg/dL (ref 8.9–10.3)
Chloride: 99 mmol/L (ref 98–111)
Creatinine, Ser: 1.09 mg/dL — ABNORMAL HIGH (ref 0.44–1.00)
GFR, Estimated: >60 mL/min (ref >60)
Glucose, Bld: 89 mg/dL (ref 70–99)
Phosphorus: 4.9 mg/dL — ABNORMAL HIGH (ref 2.5–4.6)
Potassium: 4.2 mmol/L (ref 3.5–5.1)
Sodium: 135 mmol/L (ref 135–145)

## 2022-11-13 LAB — MAGNESIUM: Magnesium: 2.4 mg/dL (ref 1.7–2.4)

## 2022-11-13 LAB — GLUCOSE, CAPILLARY: Glucose-Capillary: 78 mg/dL (ref 70–99)

## 2022-11-13 MED ORDER — FUROSEMIDE 10 MG/ML IJ SOLN
60.0000 mg | Freq: Two times a day (BID) | INTRAMUSCULAR | Status: DC
  Administered 2022-11-13 – 2022-11-15 (×4): 60 mg via INTRAVENOUS
  Filled 2022-11-13 (×4): qty 6

## 2022-11-13 MED ORDER — DIGOXIN 125 MCG PO TABS
0.1250 mg | ORAL_TABLET | Freq: Every day | ORAL | Status: DC
  Administered 2022-11-13 – 2022-11-19 (×7): 0.125 mg via ORAL
  Filled 2022-11-13 (×7): qty 1

## 2022-11-13 MED ORDER — ORAL CARE MOUTH RINSE: 15.0000 mL | OROMUCOSAL | Status: DC | PRN

## 2022-11-13 NOTE — Progress Notes (Signed)
PT Cancellation Note  Patient Details Name: Yesenia Carrillo MRN: [id number] DOB: 1996/05/19   Cancelled Treatment:    Reason Eval/Treat Not Completed: Other (comment);Patient declined, no reason specified (Patient is politely refusing PT all together. She is reporting she has no PT needs at this time and is not interested in PT coming back. She states she has been up and has no weakness. Encouraged mobility.) Please re-consult if PT needs arise, and/or patient willing to participate.   Donna Bernard, PT, MPT  Ina Homes 11/13/2022, 8:45 AM

## 2022-11-13 NOTE — Progress Notes (Signed)
Progress Note   Patient: Yesenia Carrillo ZOX:[id number] DOB: Jan 10, 1996 DOA: 11/09/2022     3 DOS: the patient was seen and examined on 11/13/2022   Brief hospital course: ICU transfer for 11/13/2022.  Yesenia Carrillo is a 27 y.o. female who has a PMH of morbid obesity, HTN, HFpEF, RV failure, pulmonary hypertension (group 2 and 3) on tyvaso and sildenafil, provoked PE on anticoagulation, prior splenic vein thrombosis with splenic infarcts, chronic idiopathic leukocytosis/thrombocytosis, sickle cell trait, interstitial lung disease NOS, not on immunosuppression, Chronic respiratory failure with hypoxia on oxygen 8-10L at home baseline, HSV infection,  Iron deficiency, and Polycythemia who presented to the ED with respiratory distress.   On review of her chart, has multiple hospitalizations for similar reason, patient was recently admitted at [facility] in the MICU unit on 10/23/2022 -10/27/2022 with acute on chronic hypoxemic respiratory failure secondary to pulmonary hypertension and volume overload. She was initially placed on high flow nasal cannula but was able to be transitioned to her home nasal cannula oxygen at 8 to 10 L with aggressive IV diuresis. Her torsemide was also increased to 60 mg twice daily with recommendations to follow up with pulmonary hypertension clinic.  ED Course: Initial vital signs showed HR of 135 beats/minute, BP 112/61 mm Hg, the RR 45 breaths/minute, and the oxygen saturation 95% on NRB and a temperature of 99.4F (37.2C). Patient was placed on BiPAP due to respiratory distress unfortunately she started vomiting 5 minutes later and was transitioned to 50L @ 100% HFNC.   Pertinent Labs/Diagnostics Findings: Na+/ K+: 136/3.0 Glucose: 969 BUN/Cr.:15/1.19 WBC:13.1 Hgb/Hct: 15.2/43.8  PCT: negative <0.10 Lactic acid:  COVID PCR: Negative, Troponin: 35 BNP: 716.1 CXR: No acute abnormality   Patient was treated with Duonebs, solumedrol 125 mg IV and mag sulfate 2g. Due to high risk for  intubation, PCCM consulted for admission.  Her presentation was likely triggered by rhinovirus infection but there was also concern of medication noncompliance at home, as well as dietary indiscretion.  Advanced heart failure team was also consulted.  She had a Weight gain of about 15 pound since prior admission.  She was diuresed with IV Lasix along with reintroduction of home metolazone and spironolactone.  Her creatinine did bump so diuretics were held for kidney recovery.  Patient mostly noncompliant with fluid intake, fluid restriction of 1500 cc recommended.  Patient also brought in her home inhaled treprostinil which was started on 6/9.  6/10: Vitals stable on HHFNC at 40L and FiO2 of 80%.  Labs with stable renal function and improving leukocytosis.  Procalcitonin remain negative.  RVP positive for rhinovirus and blood cultures remain negative.  Heart failure team will to continue IV diuresis and planning to increase the dose of sildenafil to 40 mg 3 times daily from tomorrow if blood pressure allows.  Ultimately she will need to be on triple therapy for PAH.      Assessment and Plan: * Acute on chronic respiratory failure with hypoxemia (HCC) Currently on 40 L of oxygen at 80% FiO2.  Baseline use of 8 to 10 L at home. Likely secondary to rhinovirus.  History of severe pulmonary hypertension and interstitial lung disease. -Continue with supplemental oxygen-try weaning to baseline as tolerated  Pulmonary hypertension due to interstitial lung disease [facility]) Patient follow-up at [facility] clinic. -Continue sildenafil-cardiology is planning to increase the dose to 40 mg from tomorrow if blood pressure allows. -Continue home Tyvaso 24 mcg from home now at bedside to be administered every  4 hours while awake   RVF (right ventricular failure) (HCC) Acute on chronic HFpEF.  HFpEF with prominent RV failure and pulmonary hypertension (mixed pulmonary arterial/pulmonary venous hypertension in  past):  - Echo 5/24 EF 55-60%, mild LVH, D-shaped septum suggestive of RV pressure/volume overload with peak RV-RA gradient 54 mmHg, moderate RV enlargement with severe decreased systolic function.  - RHC 5/24 showed severe PAH with severely elevated RA pressure and moderately decreased PAPi at 1.45, CI low at 1.8.  Advanced heart failure team is on board. -They resumed IV diuresis -Resume digoxin 0.125 mg daily -Fluid restriction to 1500 cc -Low-salt diet -Daily weight and BMP -Strict intake and output  History of pulmonary embolism Had provoked PE in 2019 in setting of ECMO, also had splenic vein thrombosis in 5/21.  She has been anticoagulated.  - Continue apixaban.   Elevated troponin Likely secondary to demand ischemia.  Had extensive workup with cardiology  Hypokalemia Resolved. -Continue to monitor potassium as she is being diuresed  Class 3 severe obesity with body mass index (BMI) of 40.0 to 44.9 in adult [facility]) Estimated body mass index is 43.03 kg/m as calculated from the following:   Height as of this encounter: 5\' 1"  (1.549 m).   Weight as of this encounter: 103.3 kg.   -This will complicate overall prognosis.   Subjective: Patient was resting comfortably when seen today.  No new concern.  Remained on heated high flow.  Physical Exam: Vitals:   11/13/22 0800 11/13/22 0830 11/13/22 1000 11/13/22 1018  BP:    105/81  Pulse:    88  Resp:      Temp:      TempSrc:      SpO2: 99% 98% 97% 91%  Weight:      Height:       General.  Morbidly obese lady, in no acute distress. Pulmonary.  Lungs clear bilaterally, normal respiratory effort. CV.  Regular rate and rhythm, no JVD, rub or murmur. Abdomen.  Soft, nontender, nondistended, BS positive. CNS.  Alert and oriented .  No focal neurologic deficit. Extremities.  No edema, no cyanosis, pulses intact and symmetrical. Psychiatry.  Judgment and insight appears normal.   Data Reviewed: Prior data reviewed  Family  Communication: Discussed with patient  Disposition: Status is: Inpatient Remains inpatient appropriate because: Severity of illness  Planned Discharge Destination: Home  DVT prophylaxis.  Eliquis Time spent: 50 minutes  This record has been created using Conservation officer, historic buildings. Errors have been sought and corrected,but may not always be located. Such creation errors do not reflect on the standard of care.   Author: Arnetha Courser, MD 11/13/2022 1:48 PM  For on call review [email or web address].

## 2022-11-13 NOTE — Assessment & Plan Note (Signed)
Currently on 40 L of oxygen at 80% FiO2.  Baseline use of 8 to 10 L at home. Likely secondary to rhinovirus.  History of severe pulmonary hypertension and interstitial lung disease. -Continue with supplemental oxygen-try weaning to baseline as tolerated

## 2022-11-13 NOTE — Progress Notes (Signed)
Advanced Heart Failure Rounding Note  PCP-Cardiologist: None   Subjective:    Initially diuresing with IV lasix + metolazone. Yesterday diuretics held with creatinine bump.   Remains on HFNC 40 liters.   Remains SOB with exertion.    Objective:   Weight Range: 103.3 kg Body mass index is 43.03 kg/m.   Vital Signs:   Temp:  [97.5 F (36.4 C)-98.1 F (36.7 C)] 97.9 F (36.6 C) (06/10 0700) Pulse Rate:  [63-109] 98 (06/10 0748) Resp:  [18-33] 18 (06/10 0600) BP: (100-110)/(58-74) 100/61 (06/10 0748) SpO2:  [86 %-97 %] 93 % (06/10 0756) FiO2 (%):  [50 %-80 %] 80 % (06/10 0756) Weight:  [103.3 kg] 103.3 kg (06/10 0500) Last BM Date : 11/09/22  Weight change: Filed Weights   11/11/22 0500 11/12/22 0440 11/13/22 0500  Weight: 107.9 kg 107.3 kg 103.3 kg    Intake/Output:   Intake/Output Summary (Last 24 hours) at 11/13/2022 0829 Last data filed at 11/13/2022 0700 Gross per 24 hour  Intake 716 ml  Output 900 ml  Net -184 ml      Physical Exam    General:  In bed.  No resp difficulty HEENT: Normal Neck: Supple. JVP to jaw  Carotids 2+ bilat; no bruits. No lymphadenopathy or thyromegaly appreciated. Cor: PMI nondisplaced. Regular rate & rhythm. No rubs, gallops or murmurs. Lungs: Clear on HFNC 40 L Abdomen: Soft, nontender, nondistended. No hepatosplenomegaly. No bruits or masses. Good bowel sounds. Extremities: No cyanosis, clubbing, rash, R and LLE 1+ edema Neuro: Alert & orientedx3, cranial nerves grossly intact. moves all 4 extremities w/o difficulty. Affect pleasant   Telemetry   SR 80-100s   EKG    N/A  Labs    CBC Recent Labs    11/12/22 0628 11/13/22 0631  WBC 22.4* 15.7*  NEUTROABS 16.1* 8.5*  HGB 15.5* 15.0  HCT 44.4 43.0  MCV 77.8* 77.5*  PLT 341 344   Basic Metabolic Panel Recent Labs    60/45/40 0628 11/12/22 1147 11/13/22 0631  NA 135 136 135  K 4.1 3.9 4.2  CL 95* 99 99  CO2 27 25 26   GLUCOSE 107* 96 89  BUN 31* 35*  34*  CREATININE 1.33* 1.10* 1.09*  CALCIUM 9.3 9.4 9.2  MG 2.4  --  2.4  PHOS 5.0*  --  4.9*   Liver Function Tests Recent Labs    11/11/22 0446 11/12/22 0628 11/13/22 0631  AST 31 28  --   ALT 22 24  --   ALKPHOS 115 111  --   BILITOT 3.3* 3.2*  --   PROT 7.9 8.1  --   ALBUMIN 3.7 4.1 3.9   No results for input(s): "LIPASE", "AMYLASE" in the last 72 hours. Cardiac Enzymes No results for input(s): "CKTOTAL", "CKMB", "CKMBINDEX", "TROPONINI" in the last 72 hours.  BNP: BNP (last 3 results) Recent Labs    09/26/22 2131 10/10/22 1947 11/10/22 0003  BNP 462.9* 399.9* 716.1*    ProBNP (last 3 results) No results for input(s): "PROBNP" in the last 8760 hours.   D-Dimer No results for input(s): "DDIMER" in the last 72 hours. Hemoglobin A1C No results for input(s): "HGBA1C" in the last 72 hours. Fasting Lipid Panel No results for input(s): "CHOL", "HDL", "LDLCALC", "TRIG", "CHOLHDL", "LDLDIRECT" in the last 72 hours. Thyroid Function Tests No results for input(s): "TSH", "T4TOTAL", "T3FREE", "THYROIDAB" in the last 72 hours.  Invalid input(s): "FREET3"  Other results:   Imaging    No results  found.   Medications:     Scheduled Medications:  apixaban  5 mg Oral BID   Chlorhexidine Gluconate Cloth  6 each Topical Daily   potassium chloride  40 mEq Oral BID   sildenafil  20 mg Oral TID   Treprostinil  18 mcg Inhalation Q4H while awake    Infusions:   PRN Medications: acetaminophen, docusate sodium, ondansetron (ZOFRAN) IV, mouth rinse, polyethylene glycol    Patient Profile   Yesenia Carrillo is a 27 y.o. with history of chronic interstitial lung disease/inflammatory pneumonitis, pulmonary hypertension, HFpEF, PE, splenic vein thrombosis, OSA, sickle cell trait, chronic thrombocytosis/leukocytosis was admitted with acute on chronic hypoxemic respiratory failure.    Admitted with recurrent respiratory failure.   Assessment/Plan   1. Acute on chronic  hypoxemic respiratory failure: Patient is on home oxygen 8L [place] at baseline> Initially on HFNC/BIPAP. - suspect this is due primarily to volume overload on top of severe PAH -  Now  40 liter HFNC.   2. HFpEF with prominent RV failure and pulmonary hypertension (mixed pulmonary arterial/pulmonary venous hypertension in past):  - Echo 5/24 EF 55-60%, mild LVH, D-shaped septum suggestive of RV pressure/volume overload with peak RV-RA gradient 54 mmHg, moderate RV enlargement with severe decreased systolic function.  - RHC 5/24 showed severe PAH with severely elevated RA pressure and moderately decreased PAPi at 1.45, CI low at 1.8.  - 6/9 Off diuretics with creatinine bump.  - On exam she is volume overloaded. Restart lasix 60 mg IV twice a day. Prior to admit she was taking torsemide 40 mg twice a day.   - Add back digoxin 0.125 mg daily - Limit fluid intake to < 2 liters. Low sodium diet. Daily standing weight.  - Renal function ok.     3. Pulmonary hypertension: This has been thought to be predominantly groups 2 and 3 PH from HFpEF, parenchymal lung disease (ILD process) and OSA. However, she has been on Tyvaso and sildenafil through her pulmonologist at [facility].  She is already on 8L home oxygen and I worry that she is heading towards end stage RV failure. RHC in 2022 per report had elevated PCWP.  RHC in 6/23 showed mean RA 10, PA 90/40 mean 58, mean PCWP 10, CI 1.5. Repeat RHC this admission with normal PCWP, severe PAH with high RA pressure, moderately low PAPi, and CI 1.8.  Low cardiac output is concerning. - Suspect predominantly WHO Group II and III PH. She has been on sildenafil and Tyvaso for some question of ILD related PAH.  -Sildenafil dose to 20 tid for now. Consider increasing in the next 24-48 hours.   - Will need f/u at [facility] - Will need outpatient eval for Bipap   4. 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   She has been anticoagulated.  - Continue apixaban.    5. Hypokalemia Stable.     Length of Stay: 3  Yesenia Barbour, NP  11/13/2022, 8:29 AM  Advanced Heart Failure Team Pager [phone] (M-F; 7a - 5p)  Please contact [facility] for night-coverage after hours (5p -7a ) and weekends on [email or web address]

## 2022-11-13 NOTE — Assessment & Plan Note (Signed)
Estimated body mass index is 43.03 kg/m as calculated from the following:   Height as of this encounter: 5\' 1"  (1.549 m).   Weight as of this encounter: 103.3 kg.   -This will complicate overall prognosis.

## 2022-11-13 NOTE — Assessment & Plan Note (Signed)
Likely secondary to demand ischemia.  Had extensive workup with cardiology

## 2022-11-13 NOTE — Hospital Course (Addendum)
ICU transfer for 11/13/2022.  Yesenia Carrillo is a 27 y.o. female who has a PMH of morbid obesity, HTN, HFpEF, RV failure, pulmonary hypertension (group 2 and 3) on tyvaso and sildenafil, provoked PE on anticoagulation, prior splenic vein thrombosis with splenic infarcts, chronic idiopathic leukocytosis/thrombocytosis, sickle cell trait, interstitial lung disease NOS, not on immunosuppression, Chronic respiratory failure with hypoxia on oxygen 8-10L at home baseline, HSV infection,  Iron deficiency, and Polycythemia who presented to the ED with respiratory distress.   On review of her chart, has multiple hospitalizations for similar reason, patient was recently admitted at [facility] in the MICU unit on 10/23/2022 -10/27/2022 with acute on chronic hypoxemic respiratory failure secondary to pulmonary hypertension and volume overload. She was initially placed on high flow nasal cannula but was able to be transitioned to her home nasal cannula oxygen at 8 to 10 L with aggressive IV diuresis. Her torsemide was also increased to 60 mg twice daily with recommendations to follow up with pulmonary hypertension clinic.  ED Course: Initial vital signs showed HR of 135 beats/minute, BP 112/61 mm Hg, the RR 45 breaths/minute, and the oxygen saturation 95% on NRB and a temperature of 99.90F (37.2C). Patient was placed on BiPAP due to respiratory distress unfortunately she started vomiting 5 minutes later and was transitioned to 50L @ 100% HFNC.   Pertinent Labs/Diagnostics Findings: Na+/ K+: 136/3.0 Glucose: 969 BUN/Cr.:15/1.19 WBC:13.1 Hgb/Hct: 15.2/43.8  PCT: negative <0.10 Lactic acid:  COVID PCR: Negative, Troponin: 35 BNP: 716.1 CXR: No acute abnormality   Patient was treated with Duonebs, solumedrol 125 mg IV and mag sulfate 2g. Due to high risk for intubation, PCCM consulted for admission.  Her presentation was likely triggered by rhinovirus infection but there was also concern of medication noncompliance at home,  as well as dietary indiscretion.  Advanced heart failure team was also consulted.  She had a Weight gain of about 15 pound since prior admission.  She was diuresed with IV Lasix along with reintroduction of home metolazone and spironolactone.  Her creatinine did bump so diuretics were held for kidney recovery.  Patient mostly noncompliant with fluid intake, fluid restriction of 1500 cc recommended.  Patient also brought in her home inhaled treprostinil which was started on 6/9.  6/10: Vitals stable on HHFNC at 40L and FiO2 of 80%.  Labs with stable renal function and improving leukocytosis.  Procalcitonin remain negative.  RVP positive for rhinovirus and blood cultures remain negative.  Heart failure team will to continue IV diuresis and planning to increase the dose of sildenafil to 40 mg 3 times daily from tomorrow if blood pressure allows.  Ultimately she will need to be on triple therapy for [facility].  6/11: Patient remained on HHFNC at 40 L with some improvement in FiO2 to 68%.  Frustrated and wants to leave as soon as possible.  6/12: Remained on heated high flow at 30 L, creatinine started trending up, heart failure team is recommending to continue IV diuresis today and switching to p.o. from tomorrow.  6/13: Remained on heated high flow, switched to torsemide today.  Refusing labs.  6/14: Some improvement in oxygen requirement to 35 L at 50% FiO2.  Refusing most of the care-education was provided.  She was very upset and wants to leave AMA as she wants to see her daughter.  Talked with nursing staff if we can bring her 59-year-old daughter to see her.  She is still 9 pound above her dry weight, cardiology would like to continue  diuresis.  6/15: Currently stable at 35 L and 50% FiO2.  Another message sent to respiratory therapist to try weaning as she is eager to go home.  Renal functions continue to improve, mild hypokalemia which is being repleted.  6/16: Patient was able to wean back to 10 L  at rest with some desaturation on ambulation but she quickly recovers.  Slight increase in creatinine and clinically appears euvolemic now.  IV Lasix was stopped and she will now go back to her home dose of torsemide.  She is being discharged on increased doses of sildenafil and midodrine.  She will continue with rest of her home medications except spironolactone which was discontinued by cardiology and need to have a very close follow-up with her providers at [facility] for further management of her severe pulmonary hypertension causing right heart failure.  Patient also need to continue counseling for weight loss as she most likely will need lung transplant down the road.  Patient need continuous counseling to stay compliant as it will decrease her life expectancy tremendously if she is not compliant with her life limiting and severe comorbidities.

## 2022-11-13 NOTE — Assessment & Plan Note (Signed)
Acute on chronic HFpEF.  HFpEF with prominent RV failure and pulmonary hypertension (mixed pulmonary arterial/pulmonary venous hypertension in past):  - Echo 5/24 EF 55-60%, mild LVH, D-shaped septum suggestive of RV pressure/volume overload with peak RV-RA gradient 54 mmHg, moderate RV enlargement with severe decreased systolic function.  - RHC 5/24 showed severe PAH with severely elevated RA pressure and moderately decreased PAPi at 1.45, CI low at 1.8.  Advanced heart failure team is on board. -They resumed IV diuresis -Resume digoxin 0.125 mg daily -Fluid restriction to 1500 cc -Low-salt diet -Daily weight and BMP -Strict intake and output

## 2022-11-13 NOTE — Assessment & Plan Note (Signed)
Patient follow-up at [facility]  [facility] clinic. -Continue sildenafil-cardiology is planning to increase the dose to 40 mg from tomorrow if blood pressure allows. -Continue home Tyvaso 24 mcg from home now at bedside to be administered every 4 hours while awake

## 2022-11-13 NOTE — Assessment & Plan Note (Signed)
Had provoked PE in 2019 in setting of ECMO, also had splenic vein thrombosis in 5/21.  She has been anticoagulated.  - Continue apixaban.

## 2022-11-13 NOTE — Consult Note (Signed)
PHARMACY CONSULT NOTE  Pharmacy Consult for Electrolyte Monitoring and Replacement   Recent Labs: Potassium (mmol/L)  Date Value  11/12/2022 3.9  06/06/2013 3.2 (L)   Magnesium (mg/dL)  Date Value  16/03/9603 2.4   Calcium (mg/dL)  Date Value  54/02/8118 9.4   Calcium, Total (mg/dL)  Date Value  14/78/2956 9.4   Albumin (g/dL)  Date Value  21/30/8657 4.1  06/06/2013 3.9   Phosphorus (mg/dL)  Date Value  84/69/6295 5.0 (H)   Sodium (mmol/L)  Date Value  11/12/2022 136  06/06/2013 139     Assessment: 27 yo female with PMH including HTN, HFpEF, pulmonary hypertension, Hx of PE, and chronic respiratory failure presented to the ED with respiratory distress.  Admitted for treatment of acute on chronic respiratory failure and acute on chronic CHF.  Diuretics: furosemide 60 mg IV BID  Goal of Therapy:  Electrolytes WNL  Plan:  No replacement indicated at this time Of note, patient receiving KCL BID scheduled Follow-up electrolytes with AM labs  Lowella Bandy ,PharmD Clinical Pharmacist 11/13/2022 7:05 AM

## 2022-11-13 NOTE — Progress Notes (Signed)
NAME:  Yesenia Carrillo, MRN:  [id number], DOB:  1996-05-26, LOS: 3 ADMISSION DATE:  11/09/2022, CONSULTATION DATE:  11/10/22 REFERRING MD:  Chiquita Loth  CHIEF COMPLAINT:  SOB   HPI  Yesenia Carrillo is a 27 y.o. female who has a PMH of morbid obesity, HTN, HFpEF, pulmonary hypertension (group 2 and 3) on tyvaso and sildenafil, provoked PE on anticoagulation, prior splenic vein thrombosis with splenic infarcts, chronic idiopathic leukocytosis/thrombocytosis, sickle cell trait, interstitial lung disease NOS, not on immunosuppression, Chronic respiratory failure with hypoxia on oxygen 8-10L at home baseline, HSV infection,  Iron deficiency, and Polycythemia who presented to the ED with respiratory distress.  On review of her chart, patient was recently admitted at [facility] in the MICU unit on 10/23/2022 -10/27/2022 with acute on chronic hypoxemic respiratory failure secondary to pulmonary hypertension and volume overload. She was initially placed on high flow nasal cannula but was able to be transitioned to her home nasal cannula oxygen at 8 to 10 L with aggressive IV diuresis. Her torsemide was also increased to 60 mg twice daily with recommendations to follow up with pulmonary hypertension clinic.   ED Course: Initial vital signs showed HR of 135 beats/minute, BP 112/61 mm Hg, the RR 45 breaths/minute, and the oxygen saturation 95% on NRB and a temperature of 99.30F (37.2C). Patient was placed on BiPAP due to respiratory distress unfortunately she started vomiting 5 minutes later and was transitioned to 50L @ 100% HFNC.  Pertinent Labs/Diagnostics Findings: Na+/ K+: 136/3.0 Glucose: 969 BUN/Cr.:15/1.19 WBC:13.1 Hgb/Hct: 15.2/43.8  PCT: negative <0.10 Lactic acid:  COVID PCR: Negative, Troponin: 35 BNP: 716.1 CXR: No acute abnormality  Patient was treated with Duonebs, solumedrol 125 mg IV and mag sulfate 2g. Due to high risk for intubation, PCCM consulted for admission.  11/13/22-  Patient has been signed out  to [facility].  She is stable and has been optimized for transfer out of ICU.  Patient examined at bedside, bloodwork with trending down WBC count and stable BMP.   Past Medical History  morbid obesity, HTN, HFpEF, pulmonary hypertension (group 2 and 3) on tyvaso and sildenafil, provoked PE on anticoagulation, prior splenic vein thrombosis with splenic infarcts, chronic idiopathic leukocytosis/thrombocytosis, sickle cell trait, interstitial lung disease NOS, not on immunosuppression, Chronic respiratory failure with hypoxia on oxygen 8-10L at home baseline, HSV infection,  Iron deficiency, and Polycythemia  Significant Hospital Events   6/7: Admitted to ICU with acute on chronic hypoxemic respiratory failure 6/8: Pt remains on HHFNC @59 %/45L.  Pt now has Tyvaso at bedside will start today and hold lasix due to worsening renal function   Consults:  Cardiology  Procedures:  None  Significant Diagnostic Tests:  6/7: Chest Xray> no acute cardiopulmonary process  Interim History / Subjective:  As outlined above under significant events above   Micro Data:  6/7: SARS-CoV-2 PCR> negative 6/7: Influenza PCR> negative 6/7: Blood culture x2>negative  6/7: MRSA PCR>> negative  6/7: Respiratory panel (~20 pathogens)>rhinovirus   Antimicrobials:  None  OBJECTIVE  Blood pressure 100/61, pulse 98, temperature 97.9 F (36.6 C), resp. rate 18, height 5\' 1"  (1.549 m), weight 103.3 kg, SpO2 93 %, unknown if currently breastfeeding.    FiO2 (%):  [80 %] 80 %   Intake/Output Summary (Last 24 hours) at 11/13/2022 1026 Last data filed at 11/13/2022 0700 Gross per 24 hour  Intake 716 ml  Output 900 ml  Net -184 ml    Filed Weights   11/11/22 0500 11/12/22 0440  11/13/22 0500  Weight: 107.9 kg 107.3 kg 103.3 kg   Physical Examination  GENERAL: Acute on chronically-ill obese, female, NAD on HHFNC  HEENT: Head atraumatic, normocephalic. Oropharynx and nasopharynx clear.  NECK:  No JVD, supple  LUNGS:  Diminished throughout, even, non labored  CARDIOVASCULAR: NSR, s1s2, no m/r/g, 2+ radial/2+ distal pulses, no edema   ABDOMEN: +BS x4, obese, soft, non tender  EXTREMITIES: Normal bulk and tone, moves all extremities  NEUROLOGIC: Alert and oriented, following commands, PERRLA   SKIN: No obvious rash, lesion, or ulcer. Warm to touch Labs/imaging that I havepersonally reviewed  (right click and "Reselect all SmartList Selections" daily)     Labs   CBC: Recent Labs  Lab 11/10/22 0003 11/10/22 0507 11/12/22 0628 11/13/22 0631  WBC 13.1* 12.2* 22.4* 15.7*  NEUTROABS 7.6  --  16.1* 8.5*  HGB 15.2* 15.2* 15.5* 15.0  HCT 43.8 44.6 44.4 43.0  MCV 79.3* 78.8* 77.8* 77.5*  PLT 342 346 341 344     Basic Metabolic Panel: Recent Labs  Lab 11/10/22 0003 11/10/22 0507 11/11/22 0449 11/11/22 1352 11/12/22 0628 11/12/22 1147 11/13/22 0631  NA 136 136 135 134* 135 136 135  K 3.0* 3.7 4.2 4.3 4.1 3.9 4.2  CL 99 103 99 99 95* 99 99  CO2 23 20* 23 23 27 25 26   GLUCOSE 96 180* 200* 106* 107* 96 89  BUN 15 17 26* 26* 31* 35* 34*  CREATININE 1.19* 1.04* 1.28* 1.18* 1.33* 1.10* 1.09*  CALCIUM 9.2 9.4 9.8 9.9 9.3 9.4 9.2  MG 1.6* 2.3 2.4  --  2.4  --  2.4  PHOS  --  3.0 3.7  --  5.0*  --  4.9*    GFR: Estimated Creatinine Clearance: 86.4 mL/min (A) (by C-G formula based on SCr of 1.09 mg/dL (H)). Recent Labs  Lab 11/10/22 0003 11/10/22 0507 11/10/22 0724 11/10/22 1252 11/12/22 0628 11/13/22 0631  PROCALCITON  --  <0.10  --   --  <0.10  --   WBC 13.1* 12.2*  --   --  22.4* 15.7*  LATICACIDVEN  --  2.3* 2.5* 2.0*  --   --      Liver Function Tests: Recent Labs  Lab 11/10/22 0003 11/11/22 0446 11/12/22 0628 11/13/22 0631  AST 31 31 28   --   ALT 19 22 24   --   ALKPHOS 138* 115 111  --   BILITOT 3.8* 3.3* 3.2*  --   PROT 7.7 7.9 8.1  --   ALBUMIN 3.8 3.7 4.1 3.9    No results for input(s): "LIPASE", "AMYLASE" in the last 168 hours. No results for input(s): "AMMONIA"  in the last 168 hours.  ABG    Component Value Date/Time   PHART 7.44 10/29/2019 1224   PCO2ART 37 10/29/2019 1224   PO2ART 60 (L) 10/29/2019 1224   HCO3 27.0 10/12/2022 1307   TCO2 28 10/12/2022 1011   ACIDBASEDEF 0.6 10/30/2019 0649   O2SAT 62.6 10/17/2022 0447     Coagulation Profile: No results for input(s): "INR", "PROTIME" in the last 168 hours.  Cardiac Enzymes: No results for input(s): "CKTOTAL", "CKMB", "CKMBINDEX", "TROPONINI" in the last 168 hours.  HbA1C: Hgb A1c MFr Bld  Date/Time Value Ref Range Status  08/09/2022 08:53 AM 6.1 (H) 4.8 - 5.6 % Final    Comment:    (NOTE)         Prediabetes: 5.7 - 6.4         Diabetes: >6.4  Glycemic control for adults with diabetes: <7.0   05/24/2022 03:02 AM 6.2 (H) 4.8 - 5.6 % Final    Comment:    (NOTE)         Prediabetes: 5.7 - 6.4         Diabetes: >6.4         Glycemic control for adults with diabetes: <7.0     CBG: Recent Labs  Lab 11/10/22 1238 11/11/22 1954 11/12/22 0730 11/12/22 1943  GLUCAP 94 112* 112* 86     Review of Systems: Positives in BOLD   Gen: Denies fever, chills, weight change, fatigue, night sweats HEENT: Denies blurred vision, double vision, hearing loss, tinnitus, sinus congestion, rhinorrhea, sore throat, neck stiffness, dysphagia PULM: shortness of breath, cough, sputum production, hemoptysis, wheezing CV: Denies chest pain, edema, orthopnea, paroxysmal nocturnal dyspnea, palpitations GI: Denies abdominal pain, nausea, vomiting, diarrhea, hematochezia, melena, constipation, change in bowel habits GU: Denies dysuria, hematuria, polyuria, oliguria, urethral discharge Endocrine: Denies hot or cold intolerance, polyuria, polyphagia or appetite change Derm: Denies rash, dry skin, scaling or peeling skin change Heme: Denies easy bruising, bleeding, bleeding gums Neuro: Denies headache, numbness, weakness, slurred speech, loss of memory or consciousness  Past Medical History   She,  has a past medical history of CHF (congestive heart failure) (HCC), COVID-19 (04/2019), Hypertension, Interstitial lung disease (HCC), Morbid obesity with BMI of 40.0-44.9, adult (HCC), and Pre-eclampsia.   Surgical History    Past Surgical History:  Procedure Laterality Date   ECMO CANNULATION     RIGHT HEART CATH N/A 10/12/2022   Procedure: RIGHT HEART CATH;  Surgeon: Laurey Morale, MD;  Location: [facility] INVASIVE CV LAB;  Service: Cardiovascular;  Laterality: N/A;   TONSILLECTOMY     WISDOM TOOTH EXTRACTION       Social History   reports that she quit smoking about 6 years ago. Her smoking use included cigarettes. She has never used smokeless tobacco. She reports that she does not currently use alcohol. She reports that she does not use drugs.   Family History   Her family history includes Healthy in her father and mother.   Allergies Allergies  Allergen Reactions   Ampicillin Rash and Other (See Comments)    Patient and family cannot remember the specifics of the reaction.   Penicillins Hives    Previously tolerated cefazolin and cefepime 05/03/18     Home Medications  Prior to Admission medications   Medication Sig Start Date End Date Taking? Authorizing Provider  albuterol (VENTOLIN HFA) 108 (90 Base) MCG/ACT inhaler Inhale 1-2 puffs into the lungs every 6 (six) hours as needed for wheezing or shortness of breath. 07/25/21  Yes [provider]  digoxin (LANOXIN) 0.125 MG tablet Take 1 tablet (0.125 mg total) by mouth daily. 10/18/22  Yes Enedina Finner, MD  ELIQUIS 5 MG TABS tablet Take 5 mg by mouth 2 (two) times daily.   Yes [provider]  famotidine (PEPCID) 20 MG tablet Take 20 mg by mouth 2 (two) times daily.   Yes [provider]  ipratropium-albuterol (DUONEB) 0.5-2.5 (3) MG/3ML SOLN Take 3 mLs by nebulization every 6 (six) hours as needed. 09/29/22  Yes Darlin Priestly, MD  midodrine (PROAMATINE) 5 MG tablet Take 1 tablet (5 mg total) by mouth  3 (three) times daily with meals. 10/17/22  Yes Enedina Finner, MD  sildenafil (REVATIO) 20 MG tablet Take 20 mg by mouth 3 (three) times daily. 04/19/22  Yes [provider]  spironolactone (ALDACTONE)  25 MG tablet Take 25 mg by mouth daily.   Yes [provider]  torsemide (DEMADEX) 20 MG tablet Take 2 tablets (40 mg total) by mouth 2 (two) times daily. 10/17/22 10/17/23 Yes Enedina Finner, MD  TYVASO REFILL 0.6 MG/ML SOLN Inhale 18 mcg into the lungs every 4 (four) hours while awake. 11/30/21  Yes [provider]  calcium carbonate (TUMS - DOSED IN MG ELEMENTAL CALCIUM) 500 MG chewable tablet Chew 1 tablet (200 mg of elemental calcium total) by mouth 3 (three) times daily as needed for indigestion or heartburn. Patient not taking: Reported on 11/10/2022 10/17/22   Enedina Finner, MD  Fluticasone-Umeclidin-Vilant (TRELEGY ELLIPTA) 200-62.5-25 MCG/ACT AEPB Inhale 1 puff into the lungs daily. Patient not taking: Reported on 11/10/2022    [provider]  hydrocortisone cream 1 % Apply topically as needed for itching. Patient not taking: Reported on 11/10/2022 10/17/22   Enedina Finner, MD  lidocaine (XYLOCAINE) 2 % jelly Apply 1 Application topically daily as needed (vaginal itching). Patient not taking: Reported on 11/10/2022 10/17/22   Enedina Finner, MD  Scheduled Meds:  apixaban  5 mg Oral BID   Chlorhexidine Gluconate Cloth  6 each Topical Daily   digoxin  0.125 mg Oral Daily   furosemide  60 mg Intravenous BID   potassium chloride  40 mEq Oral BID   sildenafil  20 mg Oral TID   Treprostinil  18 mcg Inhalation Q4H while awake   Continuous Infusions:   PRN Meds:.acetaminophen, docusate sodium, ondansetron (ZOFRAN) IV, mouth rinse, polyethylene glycol  Active Hospital Problem list   See below  Assessment & Plan:  #Acute on chronic respiratory failure on oxygen 8-10L at home baseline #Rhinovirus  #ILD  #Pulmonary Hypertension Hx: OSA  - Supplemental O2 for dyspnea and/or  hypoxia  - Maintain O2 sats 92% or higher  - Continue sildenafil  - Tyvaso 24 mcg from home now at bedside to be administered every 4 hours while awake - Fluid restriction  - Daily weights - Cardiology consulted appreciate input   #Acute on Chronic HFpEF #Mildly elevated troponin  likely secondary to demand ischemia  Hx: PE  - Continuous telemetry monitoring  - Continue outpatient apixaban  - Hold furosemide, spironolactone, and metolazone for now due to AKI  - Cardiology consult   #Leukocytosis secondary to rhinovirus  - Trend WBC and monitor fever curve  - Follow cultures  - No indication for abx at this time pct remains negative   #AKI likely ATN in the setting of above~worsening  #Hypokalemia~resolved  - Trend BMP  - Replace electrolytes as indicated  - Monitor UOP  - Hold furosemide, spironolactone, and metolazone for now.  Will repeat BMP at noon today  #Nausea/Vomiting~resolved   #Elevated Bilirubin likely related to hepatic congestion from RHF - Trend hepatic function panel   #Endo - CBG's qshift   Best practice:  Diet:  Oral Pain/Anxiety/Delirium protocol (if indicated): No VAP protocol (if indicated): Not indicated DVT prophylaxis: Systemic AC GI prophylaxis: N/A Glucose control:  SSI No Central venous access:  N/A Arterial line:  N/A Foley:  N/A Mobility: Activity as tolerated  PT consulted: Yes  Last date of multidisciplinary goals of care discussion [11/12/22] Code Status:  full code Disposition: ICU  06/9: Pt updated regarding current plan of care and all questions answered   Vida Rigger, M.D.  Pulmonary & Critical Care Medicine  [facility]

## 2022-11-13 NOTE — Assessment & Plan Note (Signed)
Resolved. -Continue to monitor potassium as she is being diuresed

## 2022-11-14 DIAGNOSIS — J9621 Acute and chronic respiratory failure with hypoxia: Secondary | ICD-10-CM | POA: Diagnosis not present

## 2022-11-14 LAB — BASIC METABOLIC PANEL
Anion gap: 11 (ref 5–15)
BUN: 26 mg/dL — ABNORMAL HIGH (ref 6–20)
CO2: 26 mmol/L (ref 22–32)
Calcium: 9.2 mg/dL (ref 8.9–10.3)
Chloride: 98 mmol/L (ref 98–111)
Creatinine, Ser: 0.85 mg/dL (ref 0.44–1.00)
GFR, Estimated: >60 mL/min (ref >60)
Glucose, Bld: 73 mg/dL (ref 70–99)
Potassium: 4 mmol/L (ref 3.5–5.1)
Sodium: 135 mmol/L (ref 135–145)

## 2022-11-14 LAB — MAGNESIUM: Magnesium: 2.5 mg/dL — ABNORMAL HIGH (ref 1.7–2.4)

## 2022-11-14 LAB — CULTURE, BLOOD (ROUTINE X 2)

## 2022-11-14 LAB — GLUCOSE, CAPILLARY
Glucose-Capillary: 76 mg/dL (ref 70–99)
Glucose-Capillary: 103 mg/dL — ABNORMAL HIGH (ref 70–99)

## 2022-11-14 NOTE — Progress Notes (Signed)
Patient refusing to keep blood pressure cuff on and not allowing staff to relocated the O2 probe to a better location to try to get a better constent pleth patient also removes O2 sticker frequently. When spot checking O2 97% and BP WNL.  Patient provided with education after refusing lab draws twice. Patient agreeable after nurse explained the importance of having labs taken.

## 2022-11-14 NOTE — Consult Note (Signed)
PHARMACY CONSULT NOTE  Pharmacy Consult for Electrolyte Monitoring and Replacement   Recent Labs: Potassium (mmol/L)  Date Value  11/14/2022 4.0  06/06/2013 3.2 (L)   Magnesium (mg/dL)  Date Value  78/29/5621 2.5 (H)   Calcium (mg/dL)  Date Value  30/86/5784 9.2   Calcium, Total (mg/dL)  Date Value  69/62/9528 9.4   Albumin (g/dL)  Date Value  41/32/4401 3.9  06/06/2013 3.9   Phosphorus (mg/dL)  Date Value  02/72/5366 4.9 (H)   Sodium (mmol/L)  Date Value  11/14/2022 135  06/06/2013 139     Assessment: 27 yo female with PMH including HTN, HFpEF, pulmonary hypertension, Hx of PE, and chronic respiratory failure presented to the ED with respiratory distress.  Admitted for treatment of acute on chronic respiratory failure and acute on chronic CHF.  Diuretics: furosemide 60 mg IV BID  Goal of Therapy:  Electrolytes WNL  Plan:  No replacement indicated at this time Of note, patient receiving KCL BID scheduled Follow-up electrolytes with AM labs  Lowella Bandy ,PharmD Clinical Pharmacist 11/14/2022 6:57 AM

## 2022-11-14 NOTE — Progress Notes (Signed)
Progress Note   Patient: Yesenia Carrillo UJW:[id number] DOB: 03/14/1996 DOA: 11/09/2022     4 DOS: the patient was seen and examined on 11/14/2022   Brief hospital course: ICU transfer for 11/13/2022.  Yesenia Carrillo is a 27 y.o. female who has a PMH of morbid obesity, HTN, HFpEF, RV failure, pulmonary hypertension (group 2 and 3) on tyvaso and sildenafil, provoked PE on anticoagulation, prior splenic vein thrombosis with splenic infarcts, chronic idiopathic leukocytosis/thrombocytosis, sickle cell trait, interstitial lung disease NOS, not on immunosuppression, Chronic respiratory failure with hypoxia on oxygen 8-10L at home baseline, HSV infection,  Iron deficiency, and Polycythemia who presented to the ED with respiratory distress.   On review of her chart, has multiple hospitalizations for similar reason, patient was recently admitted at [facility] in the MICU unit on 10/23/2022 -10/27/2022 with acute on chronic hypoxemic respiratory failure secondary to pulmonary hypertension and volume overload. She was initially placed on high flow nasal cannula but was able to be transitioned to her home nasal cannula oxygen at 8 to 10 L with aggressive IV diuresis. Her torsemide was also increased to 60 mg twice daily with recommendations to follow up with pulmonary hypertension clinic.  ED Course: Initial vital signs showed HR of 135 beats/minute, BP 112/61 mm Hg, the RR 45 breaths/minute, and the oxygen saturation 95% on NRB and a temperature of 99.76F (37.2C). Patient was placed on BiPAP due to respiratory distress unfortunately she started vomiting 5 minutes later and was transitioned to 50L @ 100% HFNC.   Pertinent Labs/Diagnostics Findings: Na+/ K+: 136/3.0 Glucose: 969 BUN/Cr.:15/1.19 WBC:13.1 Hgb/Hct: 15.2/43.8  PCT: negative <0.10 Lactic acid:  COVID PCR: Negative, Troponin: 35 BNP: 716.1 CXR: No acute abnormality   Patient was treated with Duonebs, solumedrol 125 mg IV and mag sulfate 2g. Due to high risk for  intubation, PCCM consulted for admission.  Her presentation was likely triggered by rhinovirus infection but there was also concern of medication noncompliance at home, as well as dietary indiscretion.  Advanced heart failure team was also consulted.  She had a Weight gain of about 15 pound since prior admission.  She was diuresed with IV Lasix along with reintroduction of home metolazone and spironolactone.  Her creatinine did bump so diuretics were held for kidney recovery.  Patient mostly noncompliant with fluid intake, fluid restriction of 1500 cc recommended.  Patient also brought in her home inhaled treprostinil which was started on 6/9.  6/10: Vitals stable on HHFNC at 40L and FiO2 of 80%.  Labs with stable renal function and improving leukocytosis.  Procalcitonin remain negative.  RVP positive for rhinovirus and blood cultures remain negative.  Heart failure team will to continue IV diuresis and planning to increase the dose of sildenafil to 40 mg 3 times daily from tomorrow if blood pressure allows.  Ultimately she will need to be on triple therapy for [facility].  6/11: Patient remained on HHFNC at 40 L with some improvement in FiO2 to 68%.  Frustrated and wants to leave as soon as possible.     Assessment and Plan: * Acute on chronic respiratory failure with hypoxemia (HCC) Currently on 40 L of oxygen at 68% FiO2.  Baseline use of 8 to 10 L at home. Likely secondary to rhinovirus.  History of severe pulmonary hypertension and interstitial lung disease. -Continue with supplemental oxygen-try weaning to baseline as tolerated  Pulmonary hypertension due to interstitial lung disease [facility]) Patient follow-up at [facility] clinic. -Continue sildenafil-cardiology is planning to increase  the dose to 40 mg from tomorrow if blood pressure allows. -Continue home Tyvaso 24 mcg from home now at bedside to be administered every 4 hours while awake   RVF (right ventricular failure) (HCC) Acute on  chronic HFpEF.  HFpEF with prominent RV failure and pulmonary hypertension (mixed pulmonary arterial/pulmonary venous hypertension in past):  - Echo 5/24 EF 55-60%, mild LVH, D-shaped septum suggestive of RV pressure/volume overload with peak RV-RA gradient 54 mmHg, moderate RV enlargement with severe decreased systolic function.  - RHC 5/24 showed severe PAH with severely elevated RA pressure and moderately decreased PAPi at 1.45, CI low at 1.8.  Advanced heart failure team is on board. -They resumed IV diuresis -Resume digoxin 0.125 mg daily -Fluid restriction to 1500 cc -Low-salt diet -Daily weight and BMP -Strict intake and output  History of pulmonary embolism Had provoked PE in 2019 in setting of ECMO, also had splenic vein thrombosis in 5/21.  She has been anticoagulated.  - Continue apixaban.   Elevated troponin Likely secondary to demand ischemia.  Had extensive workup with cardiology  Hypokalemia Resolved. -Continue to monitor potassium as she is being diuresed  Class 3 severe obesity with body mass index (BMI) of 40.0 to 44.9 in adult [facility]) Estimated body mass index is 43.03 kg/m as calculated from the following:   Height as of this encounter: 5\' 1"  (1.549 m).   Weight as of this encounter: 103.3 kg.   -This will complicate overall prognosis.   Subjective: Patient was seen and examined today.  No new complaints but she was quite frustrated that why it is taking that long and wants to go home as soon as possible.  Physical Exam: Vitals:   11/14/22 1000 11/14/22 1100 11/14/22 1200 11/14/22 1300  BP:      Pulse: 95 99 (!) 101 (!) 114  Resp: 17 (!) 24 (!) 22 (!) 27  Temp:      TempSrc:      SpO2: 96% 96% 97% (!) 81%  Weight:      Height:       General.  Morbidly obese lady, in no acute distress. Pulmonary.  Lungs clear bilaterally, normal respiratory effort. CV.  Regular rate and rhythm, no JVD, rub or murmur. Abdomen.  Soft, nontender, nondistended, BS  positive. CNS.  Alert and oriented .  No focal neurologic deficit. Extremities.  No edema, no cyanosis, pulses intact and symmetrical. Psychiatry.  Judgment and insight appears normal. .   Data Reviewed: Prior data reviewed  Family Communication: Discussed with patient  Disposition: Status is: Inpatient Remains inpatient appropriate because: Severity of illness  Planned Discharge Destination: Home  DVT prophylaxis.  Eliquis Time spent: 45 minutes  This record has been created using Conservation officer, historic buildings. Errors have been sought and corrected,but may not always be located. Such creation errors do not reflect on the standard of care.   Author: Arnetha Courser, MD 11/14/2022 2:32 PM  For on call review [email or web address].

## 2022-11-14 NOTE — Assessment & Plan Note (Addendum)
Currently on 35 L of oxygen at 50% FiO2.  Baseline use of 8 to 10 L at home. Likely secondary to rhinovirus.  History of severe pulmonary hypertension and interstitial lung disease. -Continue with supplemental oxygen-try weaning to baseline as tolerated

## 2022-11-15 ENCOUNTER — Inpatient Hospital Stay: Payer: Medicaid Other

## 2022-11-15 DIAGNOSIS — J9621 Acute and chronic respiratory failure with hypoxia: Secondary | ICD-10-CM | POA: Diagnosis not present

## 2022-11-15 LAB — BASIC METABOLIC PANEL
Anion gap: 12 (ref 5–15)
BUN: 27 mg/dL — ABNORMAL HIGH (ref 6–20)
CO2: 26 mmol/L (ref 22–32)
Calcium: 9 mg/dL (ref 8.9–10.3)
Chloride: 95 mmol/L — ABNORMAL LOW (ref 98–111)
Creatinine, Ser: 1.04 mg/dL — ABNORMAL HIGH (ref 0.44–1.00)
GFR, Estimated: >60 mL/min (ref >60)
Glucose, Bld: 130 mg/dL — ABNORMAL HIGH (ref 70–99)
Potassium: 4.2 mmol/L (ref 3.5–5.1)
Sodium: 133 mmol/L — ABNORMAL LOW (ref 135–145)

## 2022-11-15 LAB — GLUCOSE, CAPILLARY
Glucose-Capillary: 98 mg/dL (ref 70–99)
Glucose-Capillary: 81 mg/dL (ref 70–99)

## 2022-11-15 LAB — CULTURE, BLOOD (ROUTINE X 2): Special Requests: ADEQUATE

## 2022-11-15 LAB — MAGNESIUM: Magnesium: 2.3 mg/dL (ref 1.7–2.4)

## 2022-11-15 MED ORDER — TORSEMIDE 20 MG PO TABS
60.0000 mg | ORAL_TABLET | Freq: Two times a day (BID) | ORAL | Status: DC
  Administered 2022-11-15 – 2022-11-17 (×4): 60 mg via ORAL
  Filled 2022-11-15 (×5): qty 3

## 2022-11-15 MED ORDER — DIPHENHYDRAMINE HCL 25 MG PO CAPS
25.0000 mg | ORAL_CAPSULE | Freq: Once | ORAL | Status: AC
  Administered 2022-11-15: 25 mg via ORAL
  Filled 2022-11-15: qty 1

## 2022-11-15 MED ORDER — MIDODRINE HCL 5 MG PO TABS: 10.0000 mg | ORAL_TABLET | Freq: Three times a day (TID) | ORAL | Status: DC

## 2022-11-15 MED ORDER — MIDODRINE HCL 5 MG PO TABS
10.0000 mg | ORAL_TABLET | Freq: Three times a day (TID) | ORAL | Status: DC
  Administered 2022-11-15 – 2022-11-19 (×12): 10 mg via ORAL
  Filled 2022-11-15 (×13): qty 2

## 2022-11-15 NOTE — Assessment & Plan Note (Addendum)
Acute on chronic HFpEF.  HFpEF with prominent RV failure and pulmonary hypertension (mixed pulmonary arterial/pulmonary venous hypertension in past):  - Echo 5/24 EF 55-60%, mild LVH, D-shaped septum suggestive of RV pressure/volume overload with peak RV-RA gradient 54 mmHg, moderate RV enlargement with severe decreased systolic function.  - RHC 5/24 showed severe PAH with severely elevated RA pressure and moderately decreased PAPi at 1.45, CI low at 1.8.  Advanced heart failure team is on board. -Switch to torsemide today -Resume digoxin 0.125 mg daily -Fluid restriction to 1500 cc -Low-salt diet -Daily weight and BMP -Strict intake and output

## 2022-11-15 NOTE — Progress Notes (Signed)
Advanced Heart Failure Rounding Note  PCP-Cardiologist: None   Subjective:    - On HFNC at 40LPM this AM; I decreased to 25LPM with no change in O2 sats.  - Mildly hypervolemic on exam - Wishes to go home now. Complaining about cardiac diet.    Objective:   Weight Range: 105.8 kg Body mass index is 44.07 kg/m.   Vital Signs:   Temp:  [98.4 F (36.9 C)] 98.4 F (36.9 C) (06/11 1930) Pulse Rate:  [90-114] 96 (06/12 0950) Resp:  [19-29] 20 (06/12 0950) BP: (90-116)/(56-91) 115/79 (06/12 0625) SpO2:  [81 %-100 %] 96 % (06/12 0950) FiO2 (%):  [70 %] 70 % (06/12 0950) Weight:  [105.8 kg] 105.8 kg (06/12 0420) Last BM Date : 11/14/22  Weight change: Filed Weights   11/13/22 0500 11/14/22 0500 11/15/22 0420  Weight: 103.3 kg 104.3 kg 105.8 kg    Intake/Output:   Intake/Output Summary (Last 24 hours) at 11/15/2022 1000 Last data filed at 11/15/2022 0952 Gross per 24 hour  Intake 600 ml  Output 2350 ml  Net -1750 ml       Physical Exam    General: laying comfortably in bed HEENT: Normal Neck: Supple. JVP difficult to assess; appears to be 10cm Cor: PMI nondisplaced. Regular rate & rhythm. No rubs, gallops or murmurs. Lungs: Clear on HFNC 40 L Abdomen: Soft, nontender, nondistended. No hepatosplenomegaly. No bruits or masses. Good bowel sounds. Extremities: No cyanosis, clubbing, rash, R and LLE 1+ edema Neuro: Alert & orientedx3, cranial nerves grossly intact. moves all 4 extremities w/o difficulty. Affect pleasant   Telemetry   SR 80-100s   EKG    N/A  Labs    CBC Recent Labs    11/13/22 0631  WBC 15.7*  NEUTROABS 8.5*  HGB 15.0  HCT 43.0  MCV 77.5*  PLT 344    Basic Metabolic Panel Recent Labs    16/10/96 0631 11/14/22 0614 11/15/22 0419  NA 135 135 133*  K 4.2 4.0 4.2  CL 99 98 95*  CO2 26 26 26   GLUCOSE 89 73 130*  BUN 34* 26* 27*  CREATININE 1.09* 0.85 1.04*  CALCIUM 9.2 9.2 9.0  MG 2.4 2.5* 2.3  PHOS 4.9*  --   --      Liver Function Tests Recent Labs    11/13/22 0631  ALBUMIN 3.9    No results for input(s): "LIPASE", "AMYLASE" in the last 72 hours. Cardiac Enzymes No results for input(s): "CKTOTAL", "CKMB", "CKMBINDEX", "TROPONINI" in the last 72 hours.  BNP: BNP (last 3 results) Recent Labs    09/26/22 2131 10/10/22 1947 11/10/22 0003  BNP 462.9* 399.9* 716.1*      Imaging    No results found.   Medications:     Scheduled Medications:  apixaban  5 mg Oral BID   Chlorhexidine Gluconate Cloth  6 each Topical Daily   digoxin  0.125 mg Oral Daily   midodrine  10 mg Oral TID WC   potassium chloride  40 mEq Oral BID   sildenafil  20 mg Oral TID   torsemide  60 mg Oral BID   Treprostinil  18 mcg Inhalation Q4H while awake    Infusions:   PRN Medications: acetaminophen, docusate sodium, ondansetron (ZOFRAN) IV, mouth rinse, polyethylene glycol    Patient Profile   Yesenia Carrillo is a 27 y.o. with history of chronic interstitial lung disease/inflammatory pneumonitis, pulmonary hypertension, HFpEF, PE, splenic vein thrombosis, OSA, sickle cell trait, chronic thrombocytosis/leukocytosis  was admitted with acute on chronic hypoxemic respiratory failure.    Admitted with recurrent respiratory failure.   Assessment/Plan   1. Acute on chronic hypoxemic respiratory failure: Patient is on home oxygen 8L [place] at baseline> Initially on HFNC/BIPAP. - suspect this is due primarily to volume overload on top of severe PAH -  Reduced HFNC to 25LPM and watched O2 sats. They remained >95%. Would aggressively wean HFNC through today and avoid increasing O2. She has drops in O2 sats due to poor waveforms on oximetry. Plan to transition to [place] by tomorrow.  - Adding midodrine 10mg  TID with plan to uptitrate sildenafil to 40mg  TID.  - Start torsemide 60mg  BID  2. HFpEF with prominent RV failure and pulmonary hypertension (mixed pulmonary arterial/pulmonary venous hypertension in past):  - Echo 5/24  EF 55-60%, mild LVH, D-shaped septum suggestive of RV pressure/volume overload with peak RV-RA gradient 54 mmHg, moderate RV enlargement with severe decreased systolic function.  - RHC 5/24 showed severe PAH with severely elevated RA pressure and moderately decreased PAPi at 1.45, CI low at 1.8.  - 6/9 Off diuretics with creatinine bump.  - see above. Adding midodrine; plan to increase sildenafil. Transition to torsemide.    3. Pulmonary hypertension: This has been thought to be predominantly groups 2 and 3 PH from HFpEF, parenchymal lung disease (ILD process) and OSA. However, she has been on Tyvaso and sildenafil through her pulmonologist at [facility].  She is already on 8L home oxygen and I worry that she is heading towards end stage RV failure. RHC in 2022 per report had elevated PCWP.  RHC in 6/23 showed mean RA 10, PA 90/40 mean 58, mean PCWP 10, CI 1.5. Repeat RHC this admission with normal PCWP, severe PAH with high RA pressure, moderately low PAPi, and CI 1.8.  Low cardiac output is concerning. - Suspect predominantly WHO Group II and III PH. She has been on sildenafil and Tyvaso for some question of ILD related PAH.  - Will need f/u at [facility] - Will need outpatient eval for Bipap   4. 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  She has been anticoagulated.  - Continue apixaban.    5. Hypokalemia Stable.     Length of Stay: 5  Yesenia Brodhead, DO  11/15/2022, 10:00 AM  Advanced Heart Failure Team Pager [phone] (M-F; 7a - 5p)  Please contact [facility] for night-coverage after hours (5p -7a ) and weekends on [email or web address]  CRITICAL CARE Performed by: Dorthula Nettles   Total critical care time: 35 minutes  Critical care time was exclusive of separately billable procedures and treating other patients.  Critical care was necessary to treat or prevent imminent or life-threatening deterioration.  Critical  care was time spent personally by me on the following activities: development of treatment plan with patient and/or surrogate as well as nursing, discussions with consultants, evaluation of patient's response to treatment, examination of patient, obtaining history from patient or surrogate, ordering and performing treatments and interventions, ordering and review of laboratory studies, ordering and review of radiographic studies, pulse oximetry and re-evaluation of patient's condition.

## 2022-11-15 NOTE — Progress Notes (Signed)
Progress Note   Patient: Yesenia Carrillo NWG:[id number] DOB: [birth year] DOA: 11/09/2022     5 DOS: the patient was seen and examined on 11/15/2022   Brief Carrillo course: ICU transfer for 11/13/2022.  Yesenia Carrillo is a 28 y.o. female who has a PMH of morbid obesity, HTN, HFpEF, RV failure, pulmonary hypertension (group 2 and 3) on tyvaso and sildenafil, provoked PE on anticoagulation, prior splenic vein thrombosis with splenic infarcts, chronic idiopathic leukocytosis/thrombocytosis, sickle cell trait, interstitial lung disease NOS, not on immunosuppression, Chronic respiratory failure with hypoxia on oxygen 8-10L at home baseline, HSV infection,  Iron deficiency, and Polycythemia who presented to the ED with respiratory distress.   On review of her chart, has multiple hospitalizations for similar reason, patient was recently admitted at [facility] in the MICU unit on 10/23/2022 -10/27/2022 with acute on chronic hypoxemic respiratory failure secondary to pulmonary hypertension and volume overload. She was initially placed on high flow nasal cannula but was able to be transitioned to her home nasal cannula oxygen at 8 to 10 L with aggressive IV diuresis. Her torsemide was also increased to 60 mg twice daily with recommendations to follow up with pulmonary hypertension clinic.  ED Course: Initial vital signs showed HR of 135 beats/minute, BP 112/61 mm Hg, the RR 45 breaths/minute, and the oxygen saturation 95% on NRB and a temperature of 99.33F (37.2C). Patient was placed on BiPAP due to respiratory distress unfortunately she started vomiting 5 minutes later and was transitioned to 50L @ 100% HFNC.   Pertinent Labs/Diagnostics Findings: Na+/ K+: 136/3.0 Glucose: 969 BUN/Cr.:15/1.19 WBC:13.1 Hgb/Hct: 15.2/43.8  PCT: negative <0.10 Lactic acid:  COVID PCR: Negative, Troponin: 35 BNP: 716.1 CXR: No acute abnormality   Patient was treated with Duonebs, solumedrol 125 mg IV and mag sulfate 2g. Due to high risk for  intubation, PCCM consulted for admission.  Her presentation was likely triggered by rhinovirus infection but there was also concern of medication noncompliance at home, as well as dietary indiscretion.  Advanced heart failure team was also consulted.  She had a Weight gain of about 15 pound since prior admission.  She was diuresed with IV Lasix along with reintroduction of home metolazone and spironolactone.  Her creatinine did bump so diuretics were held for kidney recovery.  Patient mostly noncompliant with fluid intake, fluid restriction of 1500 cc recommended.  Patient also brought in her home inhaled treprostinil which was started on 6/9.  6/10: Vitals stable on HHFNC at 40L and FiO2 of 80%.  Labs with stable renal function and improving leukocytosis.  Procalcitonin remain negative.  RVP positive for rhinovirus and blood cultures remain negative.  Heart failure team will to continue IV diuresis and planning to increase the dose of sildenafil to 40 mg 3 times daily from tomorrow if blood pressure allows.  Ultimately she will need to be on triple therapy for Yes[facility].  6/11: Patient remained on HHFNC at 40 L with some improvement in FiO2 to 68%.  Frustrated and wants to leave as soon as possible.  6/12: Remained on heated high flow at 30 L, creatinine started trending up, heart failure team is recommending to continue IV diuresis today and switching to p.o. from tomorrow   Assessment and Plan: * Acute on chronic respiratory failure with hypoxemia (HCC) Currently on 30 L of oxygen at 60% FiO2.  Baseline use of 8 to 10 L at home. Likely secondary to rhinovirus.  History of severe pulmonary hypertension and interstitial lung disease. -Continue with supplemental  oxygen-try weaning to baseline as tolerated  Pulmonary hypertension due to interstitial lung disease Yesenia Carrillo) Patient follow-up at [facility] [place]  [facility]. -Continue sildenafil-cardiology is planning to increase the dose to 40 mg from tomorrow if  blood pressure allows. -Continue home Tyvaso 24 mcg from home now at bedside to be administered every 4 hours while awake   RVF (right ventricular failure) (HCC) Acute on chronic HFpEF.  HFpEF with prominent RV failure and pulmonary hypertension (mixed pulmonary arterial/pulmonary venous hypertension in past):  - Echo 5/24 EF 55-60%, mild LVH, D-shaped septum suggestive of RV pressure/volume overload with peak RV-RA gradient 54 mmHg, moderate RV enlargement with severe decreased systolic function.  - RHC 5/24 showed severe PAH with severely elevated RA pressure and moderately decreased PAPi at 1.45, CI low at 1.8.  Advanced heart failure team is on board. -Continue with IV diuresis for another day and then switching to p.o. -Resume digoxin 0.125 mg daily -Fluid restriction to 1500 cc -Low-salt diet -Daily weight and BMP -Strict intake and output  History of pulmonary embolism Had provoked PE in 2019 in setting of ECMO, also had splenic vein thrombosis in 5/21.  She has been anticoagulated.  - Continue apixaban.   Elevated troponin Likely secondary to demand ischemia.  Had extensive workup with cardiology  Hypokalemia Resolved. -Continue to monitor potassium as she is being diuresed  Class 3 severe obesity with body mass index (BMI) of 40.0 to 44.9 in adult Yesenia Carrillo) Estimated body mass index is 43.03 kg/m as calculated from the following:   Height as of this encounter: 5\' 1"  (1.549 m).   Weight as of this encounter: 103.3 kg.   -This will complicate overall prognosis.   Subjective: Patient was sleeping when seen today.  No new complaints.  Physical Exam: Vitals:   11/15/22 1021 11/15/22 1048 11/15/22 1150 11/15/22 1428  BP:   104/67   Pulse:   96   Resp:   (!) 21   Temp:      TempSrc:      SpO2: 92% (!) 89% 92% 90%  Weight:      Height:       General.  Morbidly obese lady, in no acute distress. Pulmonary.  Lungs clear bilaterally, normal respiratory effort. CV.   Regular rate and rhythm, no JVD, rub or murmur. Abdomen.  Soft, nontender, nondistended, BS positive. CNS.  Somnolent.  No focal neurologic deficit. Extremities.  No edema, no cyanosis, pulses intact and symmetrical. Psychiatry.  Judgment and insight appears normal.    Data Reviewed: Prior data reviewed  Family Communication: Discussed with patient  Disposition: Status is: Inpatient Remains inpatient appropriate because: Severity of illness  Planned Discharge Destination: Home  DVT prophylaxis.  Eliquis Time spent: 44 minutes  This record has been created using Conservation officer, historic buildings. Errors have been sought and corrected,but may not always be located. Such creation errors do not reflect on the standard of care.   Author: Arnetha Courser, MD 11/15/2022 4:33 PM  For on call review [email or web address].

## 2022-11-15 NOTE — Consult Note (Signed)
PHARMACY CONSULT NOTE  Pharmacy Consult for Electrolyte Monitoring and Replacement   Recent Labs: Potassium (mmol/L)  Date Value  11/15/2022 4.2  06/06/2013 3.2 (L)   Magnesium (mg/dL)  Date Value  19/14/7829 2.3   Calcium (mg/dL)  Date Value  56/21/3086 9.0   Calcium, Total (mg/dL)  Date Value  57/84/6962 9.4   Albumin (g/dL)  Date Value  95/28/4132 3.9  06/06/2013 3.9   Phosphorus (mg/dL)  Date Value  44/06/270 4.9 (H)   Sodium (mmol/L)  Date Value  11/15/2022 133 (L)  06/06/2013 139     Assessment: 27 yo female with PMH including HTN, HFpEF, pulmonary hypertension, Hx of PE, and chronic respiratory failure presented to the ED with respiratory distress.  Admitted for treatment of acute on chronic respiratory failure and acute on chronic CHF.  Diuretics: torsemide 60 mg po BID  Goal of Therapy:  Electrolytes WNL  Plan:  No replacement indicated at this time Of note, patient receiving KCL BID scheduled Follow-up electrolytes with AM labs  Lowella Bandy ,PharmD Clinical Pharmacist 11/15/2022 7:10 AM

## 2022-11-16 DIAGNOSIS — J9621 Acute and chronic respiratory failure with hypoxia: Secondary | ICD-10-CM | POA: Diagnosis not present

## 2022-11-16 MED ORDER — SILDENAFIL CITRATE 20 MG PO TABS
40.0000 mg | ORAL_TABLET | Freq: Three times a day (TID) | ORAL | Status: DC
  Administered 2022-11-16 – 2022-11-19 (×11): 40 mg via ORAL
  Filled 2022-11-16 (×11): qty 2

## 2022-11-16 MED ORDER — DIPHENHYDRAMINE HCL 25 MG PO CAPS
25.0000 mg | ORAL_CAPSULE | Freq: Once | ORAL | Status: AC
  Administered 2022-11-17: 25 mg via ORAL
  Filled 2022-11-16: qty 1

## 2022-11-16 NOTE — Progress Notes (Signed)
Pt asked to have family bring in new filters for nebulizer. Pt agreed

## 2022-11-16 NOTE — Progress Notes (Signed)
Progress Note   Patient: Yesenia Carrillo ZOX:[id number] DOB: Sep 02, 1995 DOA: 11/09/2022     6 DOS: the patient was seen and examined on 11/16/2022   Brief hospital course: ICU transfer for 11/13/2022.  Yesenia [facility]illo is a 27 y.o. female who has a PMH of morbid obesity, HTN, HFpEF, RV failure, pulmonary hypertension (group 2 and 3) on tyvaso and sildenafil, provoked PE on anticoagulation, prior splenic vein thrombosis with splenic infarcts, chronic idiopathic leukocytosis/thrombocytosis, sickle cell trait, interstitial lung disease NOS, not on immunosuppression, Chronic respiratory failure with hypoxia on oxygen 8-10L at home baseline, HSV infection,  Iron deficiency, and Polycythemia who presented to the ED with respiratory distress.   On review of her chart, has multiple hospitalizations for similar reason, patient was recently admitted at [facility] in the MICU unit on 10/23/2022 -10/27/2022 with acute on chronic hypoxemic respiratory failure secondary to pulmonary hypertension and volume overload. She was initially placed on high flow nasal cannula but was able to be transitioned to her home nasal cannula oxygen at 8 to 10 L with aggressive IV diuresis. Her torsemide was also increased to 60 mg twice daily with recommendations to follow up with pulmonary hypertension clinic.  ED Course: Initial vital signs showed HR of 135 beats/minute, BP 112/61 mm Hg, the RR 45 breaths/minute, and the oxygen saturation 95% on NRB and a temperature of 99.45F (37.2C). Patient was placed on BiPAP due to respiratory distress unfortunately she started vomiting 5 minutes later and was transitioned to 50L @ 100% HFNC.   Pertinent Labs/Diagnostics Findings: Na+/ K+: 136/3.0 Glucose: 969 BUN/Cr.:15/1.19 WBC:13.1 Hgb/Hct: 15.2/43.8  PCT: negative <0.10 Lactic acid:  COVID PCR: Negative, Troponin: 35 BNP: 716.1 CXR: No acute abnormality   Patient was treated with Duonebs, solumedrol 125 mg IV and mag sulfate 2g. Due to high risk for  intubation, PCCM consulted for admission.  Her presentation was likely triggered by rhinovirus infection but there was also concern of medication noncompliance at home, as well as dietary indiscretion.  Advanced heart failure team was also consulted.  She had a Weight gain of about 15 pound since prior admission.  She was diuresed with IV Lasix along with reintroduction of home metolazone and spironolactone.  Her creatinine did bump so diuretics were held for kidney recovery.  Patient mostly noncompliant with fluid intake, fluid restriction of 1500 cc recommended.  Patient also brought in her home inhaled treprostinil which was started on 6/9.  6/10: Vitals stable on HHFNC at 40L and FiO2 of 80%.  Labs with stable renal function and improving leukocytosis.  Procalcitonin remain negative.  RVP positive for rhinovirus and blood cultures remain negative.  Heart failure team will to continue IV diuresis and planning to increase the dose of sildenafil to 40 mg 3 times daily from tomorrow if blood pressure allows.  Ultimately she will need to be on triple therapy for [facility].  6/11: Patient remained on HHFNC at 40 L with some improvement in FiO2 to 68%.  Frustrated and wants to leave as soon as possible.  6/12: Remained on heated high flow at 30 L, creatinine started trending up, heart failure team is recommending to continue IV diuresis today and switching to p.o. from tomorrow.  6/13: Remained on heated high flow, switched to torsemide today.  Refusing labs.   Assessment and Plan: * Acute on chronic respiratory failure with hypoxemia (HCC) Currently on 30 L of oxygen at 60% FiO2.  Baseline use of 8 to 10 L at home. Likely secondary to  rhinovirus.  History of severe pulmonary hypertension and interstitial lung disease. -Continue with supplemental oxygen-try weaning to baseline as tolerated  Pulmonary hypertension due to interstitial lung disease (HCC) Patient follow-up at [facility]. -Continue  sildenafil-cardiology is planning to increase the dose to 40 mg from tomorrow if blood pressure allows. -Continue home Tyvaso 24 mcg from home now at bedside to be administered every 4 hours while awake   RVF (right ventricular failure) (HCC) Acute on chronic HFpEF.  HFpEF with prominent RV failure and pulmonary hypertension (mixed pulmonary arterial/pulmonary venous hypertension in past):  - Echo 5/24 EF 55-60%, mild LVH, D-shaped septum suggestive of RV pressure/volume overload with peak RV-RA gradient 54 mmHg, moderate RV enlargement with severe decreased systolic function.  - RHC 5/24 showed severe PAH with severely elevated RA pressure and moderately decreased PAPi at 1.45, CI low at 1.8.  Advanced heart failure team is on board. -Switch to torsemide today -Resume digoxin 0.125 mg daily -Fluid restriction to 1500 cc -Low-salt diet -Daily weight and BMP -Strict intake and output  History of pulmonary embolism Had provoked PE in 2019 in setting of ECMO, also had splenic vein thrombosis in 5/21.  She has been anticoagulated.  - Continue apixaban.   Elevated troponin Likely secondary to demand ischemia.  Had extensive workup with cardiology  Hypokalemia Resolved. -Continue to monitor potassium as she is being diuresed  Class 3 severe obesity with body mass index (BMI) of 40.0 to 44.9 in adult [facility]) Estimated body mass index is 43.03 kg/m as calculated from the following:   Height as of this encounter: 5\' 1"  (1.549 m).   Weight as of this encounter: 103.3 kg.   -This will complicate overall prognosis.   Subjective: Patient was seen and examined today.  She wants to see her 62-year-old daughter.  Also asking for regular food, she was counseled about the importance of low-sodium diet.  Physical Exam: Vitals:   11/16/22 1315 11/16/22 1330 11/16/22 1400 11/16/22 1420  BP:   112/70   Pulse: 84 88 92   Resp: (!) 24 (!) 26 (!) 22   Temp:      TempSrc:      SpO2: 99% 95% 95% 92%   Weight:      Height:       General.  Morbidly obese lady, in no acute distress. Pulmonary.  Lungs clear bilaterally, normal respiratory effort. CV.  Regular rate and rhythm, no JVD, rub or murmur. Abdomen.  Soft, nontender, nondistended, BS positive. CNS.  Alert and oriented .  No focal neurologic deficit. Extremities.  No edema, no cyanosis, pulses intact and symmetrical. Psychiatry.  Judgment and insight appears normal.     Data Reviewed: Prior data reviewed  Family Communication: Discussed with patient  Disposition: Status is: Inpatient Remains inpatient appropriate because: Severity of illness  Planned Discharge Destination: Home  DVT prophylaxis.  Eliquis Time spent: 40 minutes  This record has been created using Conservation officer, historic buildings. Errors have been sought and corrected,but may not always be located. Such creation errors do not reflect on the standard of care.   Author: Arnetha Courser, MD 11/16/2022 2:49 PM  For on call review [email or web address].

## 2022-11-16 NOTE — Progress Notes (Addendum)
Patient continues to refusing morning lab drawl (BMP) lab has tried twice. Made Dr. Nelson Chimes aware. Dr. Nelson Chimes okay with holding off on BMP for today, and have lab try again in the morning

## 2022-11-17 DIAGNOSIS — R0603 Acute respiratory distress: Secondary | ICD-10-CM

## 2022-11-17 DIAGNOSIS — E876 Hypokalemia: Secondary | ICD-10-CM | POA: Diagnosis not present

## 2022-11-17 DIAGNOSIS — J849 Interstitial pulmonary disease, unspecified: Secondary | ICD-10-CM | POA: Diagnosis not present

## 2022-11-17 DIAGNOSIS — I2723 Pulmonary hypertension due to lung diseases and hypoxia: Secondary | ICD-10-CM | POA: Diagnosis not present

## 2022-11-17 DIAGNOSIS — I272 Pulmonary hypertension, unspecified: Secondary | ICD-10-CM

## 2022-11-17 LAB — BASIC METABOLIC PANEL
Anion gap: 12 (ref 5–15)
BUN: 24 mg/dL — ABNORMAL HIGH (ref 6–20)
CO2: 26 mmol/L (ref 22–32)
Calcium: 9.5 mg/dL (ref 8.9–10.3)
Chloride: 96 mmol/L — ABNORMAL LOW (ref 98–111)
Creatinine, Ser: 1.05 mg/dL — ABNORMAL HIGH (ref 0.44–1.00)
GFR, Estimated: >60 mL/min (ref >60)
Glucose, Bld: 113 mg/dL — ABNORMAL HIGH (ref 70–99)
Potassium: 3.7 mmol/L (ref 3.5–5.1)
Sodium: 134 mmol/L — ABNORMAL LOW (ref 135–145)

## 2022-11-17 LAB — GLUCOSE, CAPILLARY: Glucose-Capillary: 72 mg/dL (ref 70–99)

## 2022-11-17 MED ORDER — POTASSIUM CHLORIDE 20 MEQ PO PACK
40.0000 meq | PACK | Freq: Once | ORAL | Status: AC
  Administered 2022-11-17: 40 meq via ORAL
  Filled 2022-11-17: qty 2

## 2022-11-17 MED ORDER — FUROSEMIDE 10 MG/ML IJ SOLN
80.0000 mg | Freq: Three times a day (TID) | INTRAMUSCULAR | Status: DC
  Administered 2022-11-17 – 2022-11-19 (×6): 80 mg via INTRAVENOUS
  Filled 2022-11-17 (×6): qty 8

## 2022-11-17 MED ORDER — METOLAZONE 5 MG PO TABS
5.0000 mg | ORAL_TABLET | Freq: Once | ORAL | Status: AC
  Administered 2022-11-17: 5 mg via ORAL
  Filled 2022-11-17: qty 1

## 2022-11-17 NOTE — Consult Note (Signed)
PHARMACY CONSULT NOTE  Pharmacy Consult for Electrolyte Monitoring and Replacement   Recent Labs: Potassium (mmol/L)  Date Value  11/17/2022 3.7  06/06/2013 3.2 (L)   Magnesium (mg/dL)  Date Value  16/03/9603 2.3   Calcium (mg/dL)  Date Value  54/02/8118 9.5   Calcium, Total (mg/dL)  Date Value  14/78/2956 9.4   Albumin (g/dL)  Date Value  21/30/8657 3.9  06/06/2013 3.9   Phosphorus (mg/dL)  Date Value  84/69/6295 4.9 (H)   Sodium (mmol/L)  Date Value  11/17/2022 134 (L)  06/06/2013 139     Assessment: 27 yo female with PMH including HTN, HFpEF, pulmonary hypertension, Hx of PE, and chronic respiratory failure presented to the ED with respiratory distress.  Admitted for treatment of acute on chronic respiratory failure and acute on chronic CHF.  Diuretics: furosemide 80 mg IV every 8 hours  Goal of Therapy:  Electrolytes WNL  Plan:  Given increase in diuretic regimen will add an additional KCL po x 1 Of note, patient receiving KCL po BID scheduled Follow-up electrolytes with AM labs  Lowella Bandy ,PharmD Clinical Pharmacist 11/17/2022 7:05 AM

## 2022-11-17 NOTE — Progress Notes (Signed)
Progress Note   Patient: Yesenia Carrillo WUJ:[id number] DOB: [birth year] DOA: 11/09/2022     7 DOS: the patient was seen and examined on 11/17/2022   Brief hospital course: ICU transfer for 11/13/2022.  CATRINA CHISMAN is a 27 y.o. female who has a PMH of morbid obesity, HTN, HFpEF, RV failure, pulmonary hypertension (group 2 and 3) on tyvaso and sildenafil, provoked PE on anticoagulation, prior splenic vein thrombosis with splenic infarcts, chronic idiopathic leukocytosis/thrombocytosis, sickle cell trait, interstitial lung disease NOS, not on immunosuppression, Chronic respiratory failure with hypoxia on oxygen 8-10L at home baseline, HSV infection,  Iron deficiency, and Polycythemia who presented to the ED with respiratory distress.   On review of her chart, has multiple hospitalizations for similar reason, patient was recently admitted at [facility] in the MICU unit on 10/23/2022 -10/27/2022 with acute on chronic hypoxemic respiratory failure secondary to pulmonary hypertension and volume overload. She was initially placed on high flow nasal cannula but was able to be transitioned to her home nasal cannula oxygen at 8 to 10 L with aggressive IV diuresis. Her torsemide was also increased to 60 mg twice daily with recommendations to follow up with pulmonary hypertension clinic.  ED Course: Initial vital signs showed HR of 135 beats/minute, BP 112/61 mm Hg, the RR 45 breaths/minute, and the oxygen saturation 95% on NRB and a temperature of 99.56F (37.2C). Patient was placed on BiPAP due to respiratory distress unfortunately she started vomiting 5 minutes later and was transitioned to 50L @ 100% HFNC.   Pertinent Labs/Diagnostics Findings: Na+/ K+: 136/3.0 Glucose: 969 BUN/Cr.:15/1.19 WBC:13.1 Hgb/Hct: 15.2/43.8  PCT: negative <0.10 Lactic acid:  COVID PCR: Negative, Troponin: 35 BNP: 716.1 CXR: No acute abnormality   Patient was treated with Duonebs, solumedrol 125 mg IV and mag sulfate 2g. Due to high risk for  intubation, PCCM consulted for admission.  Her presentation was likely triggered by rhinovirus infection but there was also concern of medication noncompliance at home, as well as dietary indiscretion.  Advanced heart failure team was also consulted.  She had a Weight gain of about 15 pound since prior admission.  She was diuresed with IV Lasix along with reintroduction of home metolazone and spironolactone.  Her creatinine did bump so diuretics were held for kidney recovery.  Patient mostly noncompliant with fluid intake, fluid restriction of 1500 cc recommended.  Patient also brought in her home inhaled treprostinil which was started on 6/9.  6/10: Vitals stable on HHFNC at 40L and FiO2 of 80%.  Labs with stable renal function and improving leukocytosis.  Procalcitonin remain negative.  RVP positive for rhinovirus and blood cultures remain negative.  Heart failure team will to continue IV diuresis and planning to increase the dose of sildenafil to 40 mg 3 times daily from tomorrow if blood pressure allows.  Ultimately she will need to be on triple therapy for [facility].  6/11: Patient remained on HHFNC at 40 L with some improvement in FiO2 to 68%.  Frustrated and wants to leave as soon as possible.  6/12: Remained on heated high flow at 30 L, creatinine started trending up, heart failure team is recommending to continue IV diuresis today and switching to p.o. from tomorrow.  6/13: Remained on heated high flow, switched to torsemide today.  Refusing labs.  6/14: Some improvement in oxygen requirement to 35 L at 50% FiO2.  Refusing most of the care-education was provided.  She was very upset and wants to leave AMA as she wants to see  her daughter.  Talked with nursing staff if we can bring her 22-year-old daughter to see her.  She is still 9 pound above her dry weight, cardiology would like to continue diuresis.   Assessment and Plan: * Acute on chronic respiratory failure with hypoxemia  (HCC) Currently on 30 L of oxygen at 60% FiO2.  Baseline use of 8 to 10 L at home. Likely secondary to rhinovirus.  History of severe pulmonary hypertension and interstitial lung disease. -Continue with supplemental oxygen-try weaning to baseline as tolerated  Pulmonary hypertension due to interstitial lung disease (HCC) Patient follow-up at [facility]  [facility] clinic. -Continue sildenafil-cardiology is planning to increase the dose to 40 mg from tomorrow if blood pressure allows. -Continue home Tyvaso 24 mcg from home now at bedside to be administered every 4 hours while awake   RVF (right ventricular failure) (HCC) Acute on chronic HFpEF.  HFpEF with prominent RV failure and pulmonary hypertension (mixed pulmonary arterial/pulmonary venous hypertension in past):  - Echo 5/24 EF 55-60%, mild LVH, D-shaped septum suggestive of RV pressure/volume overload with peak RV-RA gradient 54 mmHg, moderate RV enlargement with severe decreased systolic function.  - RHC 5/24 showed severe PAH with severely elevated RA pressure and moderately decreased PAPi at 1.45, CI low at 1.8.  Advanced heart failure team is on board. -Switch to torsemide today -Resume digoxin 0.125 mg daily -Fluid restriction to 1500 cc -Low-salt diet -Daily weight and BMP -Strict intake and output  History of pulmonary embolism Had provoked PE in 2019 in setting of ECMO, also had splenic vein thrombosis in 5/21.  She has been anticoagulated.  - Continue apixaban.   Elevated troponin Likely secondary to demand ischemia.  Had extensive workup with cardiology  Hypokalemia Resolved. -Continue to monitor potassium as she is being diuresed  Class 3 severe obesity with body mass index (BMI) of 40.0 to 44.9 in adult [facility]) Estimated body mass index is 43.03 kg/m as calculated from the following:   Height as of this encounter: 5\' 1"  (1.549 m).   Weight as of this encounter: 103.3 kg.   -This will complicate overall  prognosis.   Subjective: Patient was seen and examined today.  She was very upset and earlier threatening to leave AMA as she wants to see her 29-year-old daughter.  Physical Exam: Vitals:   11/17/22 0900 11/17/22 1000 11/17/22 1100 11/17/22 1200  BP: 98/71 96/62 102/74 101/76  Pulse: (!) 102 (!) 110 98 (!) 103  Resp: 18 (!) 26 (!) 22 19  Temp:    98.7 F (37.1 C)  TempSrc:    Oral  SpO2: (!) 89% (!) 79% (!) 86% 94%  Weight:      Height:       General.  Morbidly obese lady, in no acute distress. Pulmonary.  Lungs clear bilaterally, normal respiratory effort. CV.  Regular rate and rhythm, no JVD, rub or murmur. Abdomen.  Soft, nontender, nondistended, BS positive. CNS.  Alert and oriented .  No focal neurologic deficit. Extremities.  No edema, no cyanosis, pulses intact and symmetrical. Psychiatry.  Judgment and insight appears normal.    Data Reviewed: Prior data reviewed  Family Communication: Discussed with patient  Disposition: Status is: Inpatient Remains inpatient appropriate because: Severity of illness  Planned Discharge Destination: Home  DVT prophylaxis.  Eliquis Time spent: 42 minutes  This record has been created using Conservation officer, historic buildings. Errors have been sought and corrected,but may not always be located. Such creation errors do not reflect on  the standard of care.   Author: Arnetha Courser, MD 11/17/2022 2:23 PM  For on call review [email or web address].

## 2022-11-17 NOTE — Plan of Care (Addendum)
Patient is not using her Tyvaso inhailer or taking torsemide (since not allowing purwick to be placed) Educated about the need for both but still refusing. AD notified and will also talk w patient. Also insisting to wean her O2 down or she will go AMA. Explained she's currently on 4x her chronic O2 of 10L and would not live if leaves AMA??  Hospitalist and respiratory also informed.   AD spoke with patient and so agreed to take torsemide if able to get up the [facility] in a speedy fashion.  Torsemide given and also informed patient she could get up the [facility] independently as long as respiratory status did not become a further concern with this activity.  Patient reported that she did not feel chest tightness or more SOB when getting up the [facility]. AD re-iterated that per protocol, we would not be able to place a purwick.  Patient also requested to have her 26yr old come to the ICU if she's going to stay here.  AD and director agreed to NOT allow patient's child to visit in the ICU.  Patient informed.  Cardiac rounded and noted patient wt to be 104.5kg, and in May was 99kgs.  Needs to continue HEAVY diuresis throughout this shift and is concerned about desats as low as 70s - while just repositioning in the bed.  AD notified of concerns and agreed to allow purwick to be placed for this 1st shift ONLY (to allow for safe/accurate diuresis).  Cardiac also placed Nursing Communication Order requesting temporary purwick use.  Patient informed of plan.  Purwick to be DCd at end of shift (1900).

## 2022-11-17 NOTE — Progress Notes (Signed)
Rounding Note    Patient Name: Yesenia Carrillo Date of Encounter: 11/17/2022  [facility] Cardiologist: Advanced heart failure clinic  Subjective   Remains very short of breath, hypoxic, saturations around 74% Moving out of bed to stand up for weight Weight 105 kg Typically baseline weight 99 kg as it was in May 2024 on prior hospital admission Nurses indicates she did not take torsemide this morning or her inhaler Reports that she is very short of breath climbing out of bed, afraid to drop her oxygen level using bedside urinal, requesting purewick Remains on high flow oxygen, continued coughing  Inpatient Medications    Scheduled Meds:  apixaban  5 mg Oral BID   digoxin  0.125 mg Oral Daily   furosemide  80 mg Intravenous Q8H   midodrine  10 mg Oral TID WC   potassium chloride  40 mEq Oral BID   potassium chloride  40 mEq Oral Once   sildenafil  40 mg Oral Q8H   Treprostinil  18 mcg Inhalation Q4H while awake   Continuous Infusions:  PRN Meds: acetaminophen, docusate sodium, ondansetron (ZOFRAN) IV, mouth rinse, polyethylene glycol   Vital Signs    Vitals:   11/17/22 0900 11/17/22 1000 11/17/22 1100 11/17/22 1200  BP: 98/71 96/62 102/74 101/76  Pulse: (!) 102 (!) 110 98 (!) 103  Resp: 18 (!) 26 (!) 22 19  Temp:    98.7 F (37.1 C)  TempSrc:    Oral  SpO2: (!) 89% (!) 79% (!) 86% 94%  Weight:      Height:        Intake/Output Summary (Last 24 hours) at 11/17/2022 1305 Last data filed at 11/17/2022 1300 Gross per 24 hour  Intake 120 ml  Output 1500 ml  Net -1380 ml      11/16/2022    5:00 AM 11/15/2022    4:20 AM 11/14/2022    5:00 AM  Last 3 Weights  Weight (lbs) 236 lb 12.4 oz 233 lb 4 oz 229 lb 15 oz  Weight (kg) 107.4 kg 105.8 kg 104.3 kg      Telemetry    Normal sinus rhythm- Personally Reviewed  ECG     - Personally Reviewed  Physical Exam   GEN: No acute distress.  Moderate distress, on high flow oxygen Neck:  JVD 10+ Cardiac:  RRR, no murmurs, rubs, or gallops.  Respiratory: Scattered Rales GI: Soft, nontender, non-distended  MS: No edema; No deformity. Neuro:  Nonfocal  Psych: Normal affect   Labs    High Sensitivity Troponin:   Recent Labs  Lab 11/10/22 0003 11/10/22 0158  TROPONINIHS 35* 36*     Chemistry Recent Labs  Lab 11/11/22 0446 11/11/22 0449 11/12/22 0628 11/12/22 1147 11/13/22 0631 11/14/22 0614 11/15/22 0419 11/17/22 0617  NA  --    < > 135   < > 135 135 133* 134*  K  --    < > 4.1   < > 4.2 4.0 4.2 3.7  CL  --    < > 95*   < > 99 98 95* 96*  CO2  --    < > 27   < > 26 26 26 26   GLUCOSE  --    < > 107*   < > 89 73 130* 113*  BUN  --    < > 31*   < > 34* 26* 27* 24*  CREATININE  --    < > 1.33*   < >  1.09* 0.85 1.04* 1.05*  CALCIUM  --    < > 9.3   < > 9.2 9.2 9.0 9.5  MG  --    < > 2.4  --  2.4 2.5* 2.3  --   PROT 7.9  --  8.1  --   --   --   --   --   ALBUMIN 3.7  --  4.1  --  3.9  --   --   --   AST 31  --  28  --   --   --   --   --   ALT 22  --  24  --   --   --   --   --   ALKPHOS 115  --  111  --   --   --   --   --   BILITOT 3.3*  --  3.2*  --   --   --   --   --   GFRNONAA  --    < > 57*   < > >60 >60 >60 >60  ANIONGAP  --    < > 13   < > 10 11 12 12    < > = values in this interval not displayed.    Lipids No results for input(s): "CHOL", "TRIG", "HDL", "LABVLDL", "LDLCALC", "CHOLHDL" in the last 168 hours.  Hematology Recent Labs  Lab 11/12/22 0628 11/13/22 0631  WBC 22.4* 15.7*  RBC 5.71* 5.55*  HGB 15.5* 15.0  HCT 44.4 43.0  MCV 77.8* 77.5*  MCH 27.1 27.0  MCHC 34.9 34.9  RDW 22.4* 22.2*  PLT 341 344   Thyroid No results for input(s): "TSH", "FREET4" in the last 168 hours.  BNPNo results for input(s): "BNP", "PROBNP" in the last 168 hours.  DDimer No results for input(s): "DDIMER" in the last 168 hours.   Radiology    No results found.  Cardiac Studies     Patient Profile     [name] is a 27 y.o. with history of chronic interstitial lung  disease/inflammatory pneumonitis, pulmonary hypertension, HFpEF, PE, splenic vein thrombosis, OSA, sickle cell trait, chronic thrombocytosis/leukocytosis was admitted with acute on chronic hypoxemic respiratory failure.     Admitted with recurrent respiratory failure.   Assessment & Plan    1. Acute on chronic hypoxemic respiratory failure: Patient is on home oxygen 8L [place] at baseline> Initially on HFNC/BIPAP. Continued volume overload on top of severe PAH Remains on high flow oxygen with hypoxia saturations into the mid 70s on minimal exertion Continued coughing consistent with pulmonary hypertension and pulmonary edema -Standing weight today 104.5 kg, weight in May at time of discharge 99 kg -Declining torsemide this morning -We recommend dose of metolazone 5 mg with Lasix 80 every 8 hours Purewick placed in preparation for diuresis Continue midodrine 10mg  TID with plan to uptitrate sildenafil to 40mg  TID.    2. HFpEF with prominent RV failure and pulmonary hypertension (mixed pulmonary arterial/pulmonary venous hypertension in past):  - Echo 5/24 EF 55-60%, mild LVH, D-shaped septum suggestive of RV pressure/volume overload with peak RV-RA gradient 54 mmHg, moderate RV enlargement with severe decreased systolic function.  - RHC 5/24 showed severe PAH with severely elevated RA pressure and moderately decreased PAPi at 1.45, CI low at 1.8.  - 6/9 Off diuretics with creatinine bump.  Continue midodrine; high-dose sildenafil.  Additional day metolazone with IV Lasix today with consideration of transitioning to torsemide tomorrow if clinical picture permits (desaturations,  coughing, weight)   3. Pulmonary hypertension: This has been thought to be predominantly groups 2 and 3 PH from HFpEF, parenchymal lung disease (ILD process) and OSA. on Tyvaso and sildenafil through her pulmonologist at [facility].  She is already on 8L home oxygen, concern for end stage RV failure. RHC in 2022 per report had  elevated PCWP.  RHC in 6/23 showed mean RA 10, PA 90/40 mean 58, mean PCWP 10, CI 1.5. Repeat RHC this admission with normal PCWP, severe PAH with high RA pressure, moderately low PAPi, and CI 1.8.  Low cardiac output is concerning. - Suspect predominantly WHO Group II and III PH. She has been on sildenafil and Tyvaso for some question of ILD related PAH.  -  f/u at [facility] -  outpatient eval for Bipap   4. 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  She has been anticoagulated.  - Continue apixaban.    5. Hypokalemia Stable.    Long discussion with patient concerning plan above, long discussion with nursing concerning need for diuresis and purewick placement  Total encounter time more than 60 minutes  Greater than 50% was spent in counseling and coordination of care with the patient  For questions or updates, please contact [facility] Please consult [email or web address] for contact info under        Signed, Julien Nordmann, MD  11/17/2022, 1:05 PM

## 2022-11-18 DIAGNOSIS — J849 Interstitial pulmonary disease, unspecified: Secondary | ICD-10-CM | POA: Diagnosis not present

## 2022-11-18 DIAGNOSIS — R0603 Acute respiratory distress: Secondary | ICD-10-CM | POA: Diagnosis not present

## 2022-11-18 DIAGNOSIS — I2723 Pulmonary hypertension due to lung diseases and hypoxia: Secondary | ICD-10-CM | POA: Diagnosis not present

## 2022-11-18 DIAGNOSIS — E876 Hypokalemia: Secondary | ICD-10-CM | POA: Diagnosis not present

## 2022-11-18 LAB — BASIC METABOLIC PANEL
Anion gap: 13 (ref 5–15)
BUN: 23 mg/dL — ABNORMAL HIGH (ref 6–20)
CO2: 27 mmol/L (ref 22–32)
Calcium: 9.8 mg/dL (ref 8.9–10.3)
Chloride: 93 mmol/L — ABNORMAL LOW (ref 98–111)
Creatinine, Ser: 0.97 mg/dL (ref 0.44–1.00)
GFR, Estimated: >60 mL/min (ref >60)
Glucose, Bld: 83 mg/dL (ref 70–99)
Potassium: 3.3 mmol/L — ABNORMAL LOW (ref 3.5–5.1)
Sodium: 133 mmol/L — ABNORMAL LOW (ref 135–145)

## 2022-11-18 LAB — MAGNESIUM: Magnesium: 2.2 mg/dL (ref 1.7–2.4)

## 2022-11-18 MED ORDER — POTASSIUM CHLORIDE 20 MEQ PO PACK
40.0000 meq | PACK | Freq: Once | ORAL | Status: AC
  Administered 2022-11-18: 40 meq via ORAL
  Filled 2022-11-18: qty 2

## 2022-11-18 MED ORDER — POTASSIUM CHLORIDE 20 MEQ PO PACK
20.0000 meq | PACK | Freq: Once | ORAL | Status: AC
  Administered 2022-11-18: 20 meq via ORAL
  Filled 2022-11-18: qty 1

## 2022-11-18 NOTE — Progress Notes (Signed)
FIO2 increased to 55 due to desat into upper 80s

## 2022-11-18 NOTE — Assessment & Plan Note (Signed)
Potassium at 3.3, getting IV Lasix -Continue to monitor potassium and replete as needed as she is being diuresed

## 2022-11-18 NOTE — Consult Note (Signed)
PHARMACY CONSULT NOTE  Pharmacy Consult for Electrolyte Monitoring and Replacement   Recent Labs: Potassium (mmol/L)  Date Value  11/18/2022 3.3 (L)  06/06/2013 3.2 (L)   Magnesium (mg/dL)  Date Value  16/03/9603 2.2   Calcium (mg/dL)  Date Value  54/02/8118 9.8   Calcium, Total (mg/dL)  Date Value  14/78/2956 9.4   Albumin (g/dL)  Date Value  21/30/8657 3.9  06/06/2013 3.9   Phosphorus (mg/dL)  Date Value  84/69/6295 4.9 (H)   Sodium (mmol/L)  Date Value  11/18/2022 133 (L)  06/06/2013 139     Assessment: 27 yo female with PMH including HTN, HFpEF, pulmonary hypertension, Hx of PE, and chronic respiratory failure presented to the ED with respiratory distress.  Admitted for treatment of acute on chronic respiratory failure and acute on chronic CHF.  Diuretics: furosemide 80 mg IV every 8 hours Medication: Digoxin  Goal of Therapy:  Electrolytes WNL  Plan:  K 3.3   will order KCL po x 1 in addition to scheduled KCL (lasix was increased yesterday) and 20 meq po x 1 with 1400 lasix dose Of note, patient is receiving KCL po BID scheduled Follow-up electrolytes with AM labs  Angelique Blonder ,PharmD Clinical Pharmacist 11/18/2022 8:47 AM

## 2022-11-18 NOTE — Progress Notes (Addendum)
Cardiology Progress Note   Patient Name: Yesenia Carrillo Date of Encounter: 11/18/2022  Primary Cardiologist: [facility]  Subjective   Good UO yesterday.  Breathing unchanged - remains on HFNC, saturating high 80's to low 90's.  Frustrated.  Inpatient Medications    Scheduled Meds:  apixaban  5 mg Oral BID   digoxin  0.125 mg Oral Daily   furosemide  80 mg Intravenous Q8H   midodrine  10 mg Oral TID WC   potassium chloride  20 mEq Oral Once   potassium chloride  40 mEq Oral BID   sildenafil  40 mg Oral Q8H   Treprostinil  18 mcg Inhalation Q4H while awake   Continuous Infusions:  PRN Meds: acetaminophen, docusate sodium, ondansetron (ZOFRAN) IV, mouth rinse, polyethylene glycol   Vital Signs    Vitals:   11/17/22 2312 11/18/22 0248 11/18/22 0700 11/18/22 0803  BP: 98/68 108/64  107/73  Pulse: 100   99  Resp: 20 20  16   Temp: 98 F (36.7 C) 98.2 F (36.8 C)  98.5 F (36.9 C)  TempSrc:      SpO2: 93% 91%  94%  Weight:   107.9 kg   Height:        Intake/Output Summary (Last 24 hours) at 11/18/2022 1046 Last data filed at 11/18/2022 0200 Gross per 24 hour  Intake 860 ml  Output 4000 ml  Net -3140 ml   Filed Weights   11/15/22 0420 11/16/22 0500 11/18/22 0700  Weight: 105.8 kg 107.4 kg 107.9 kg    Physical Exam   GEN: Obese, in no acute distress.  HEENT: Grossly normal.  Neck: Supple, obese, difficult to gauge JVP.  No carotid bruits or masses. Cardiac: RRR, soft syst murmur @ LLSB.  RV gallop.  No rubs.  No clubbing, cyanosis, edema.  Radials 2+, DP/PT 2+ and equal bilaterally.  Respiratory:  Respirations regular and unlabored, clear to auscultation bilaterally. GI: Obese,soft, nontender, nondistended, BS + x 4. Yesenia: no deformity or atrophy. Skin: warm and dry, no rash. Neuro:  Strength and sensation are intact. Psych: AAOx3.  Normal affect.  Labs    Chemistry Recent Labs  Lab 11/12/22 [phone] 11/12/22 1147 11/13/22 0631 11/14/22 5621 11/15/22 0419  11/17/22 0617 11/18/22 0515  NA 135   < > 135   < > 133* 134* 133*  K 4.1   < > 4.2   < > 4.2 3.7 3.3*  CL 95*   < > 99   < > 95* 96* 93*  CO2 27   < > 26   < > 26 26 27   GLUCOSE 107*   < > 89   < > 130* 113* 83  BUN 31*   < > 34*   < > 27* 24* 23*  CREATININE 1.33*   < > 1.09*   < > 1.04* 1.05* 0.97  CALCIUM 9.3   < > 9.2   < > 9.0 9.5 9.8  PROT 8.1  --   --   --   --   --   --   ALBUMIN 4.1  --  3.9  --   --   --   --   AST 28  --   --   --   --   --   --   ALT 24  --   --   --   --   --   --   ALKPHOS 111  --   --   --   --   --   --  BILITOT 3.2*  --   --   --   --   --   --   GFRNONAA 57*   < > >60   < > >60 >60 >60  ANIONGAP 13   < > 10   < > 12 12 13    < > = values in this interval not displayed.     Hematology Recent Labs  Lab 11/12/22 0628 11/13/22 0631  WBC 22.4* 15.7*  RBC 5.71* 5.55*  HGB 15.5* 15.0  HCT 44.4 43.0  MCV 77.8* 77.5*  MCH 27.1 27.0  MCHC 34.9 34.9  RDW 22.4* 22.2*  PLT 341 344    Cardiac Enzymes  Recent Labs  Lab 11/10/22 0003 11/10/22 0158  TROPONINIHS 35* 36*      BNP    Component Value Date/Time   BNP 716.1 (H) 11/10/2022 0003   Lipids  Lab Results  Component Value Date   CHOL 151 08/10/2022   HDL 29 (L) 08/10/2022   LDLCALC 109 (H) 08/10/2022   TRIG 65 08/10/2022   CHOLHDL 5.2 08/10/2022    HbA1c  Lab Results  Component Value Date   HGBA1C 6.1 (H) 08/09/2022    Radiology    DG CHEST PORT 1 VIEW  Result Date: 11/15/2022 CLINICAL DATA:  Dyspnea EXAM: PORTABLE CHEST 1 VIEW COMPARISON:  Previous studies including the examination of 11/12/2022 FINDINGS: Transverse diameter of heart is increased. There is increased density in left lower lung field partly obscuring the left hemidiaphragm. There are no signs of alveolar pulmonary edema. Left lateral CP angle is indistinct. Right lateral CP angle is clear. There is no pneumothorax. IMPRESSION: Cardiomegaly. There is increased density in left lower lung fields suggesting  possible atelectasis/pneumonia. Left lateral CP angle is indistinct suggesting possible small effusion. Electronically Signed   By: Ernie Avena M.D.   On: 11/15/2022 13:18    Telemetry    Sinus rhythm to sinus tach - 90's to low 100's - Personally Reviewed  Cardiac Studies   2D Echocardiogram 5.8.2024  1. Left ventricular ejection fraction, by estimation, is 55 to 60%. The  left ventricle has normal function. The left ventricle has no regional  wall motion abnormalities. There is mild concentric left ventricular  hypertrophy. Left ventricular diastolic  parameters are indeterminate.   2. D-shaped septum suggestive of RV pressure/volume overload. IVC was not  visualized. Peak RV-RA gradient 54 mmHg. Right ventricular systolic  function is severely reduced. The right ventricular size is moderately  enlarged.   3. Right atrial size was moderately dilated.   4. The mitral valve is normal in structure. No evidence of mitral valve  regurgitation. No evidence of mitral stenosis.   5. The aortic valve is tricuspid. Aortic valve regurgitation is not  visualized. No aortic stenosis is present.   6. A small pericardial effusion is present. The pericardial effusion is  circumferential.  _____________   Patient Profile     Yesenia Carrillo is a 27 y.o. with history of chronic interstitial lung disease/inflammatory pneumonitis, pulmonary hypertension, HFpEF, PE, splenic vein thrombosis, OSA, sickle cell trait, chronic thrombocytosis/leukocytosis was admitted with acute on chronic hypoxemic respiratory failure - rhinovirus.  Assessment & Plan    1.  Acute on chronic hypoxemic resp failure:  on 8-10 lpm via [place] @ home.  Admitted w/ rhinovirus and persistent need for HFNC.  Remains on HFNC this AM - sats high 80's to low 90's. She's very frustrated.  IV lasix resumed 6/14 in setting of wt being ~  5 kg prior dry wt (see below).  Cont diuresis.  2.  Acute on chronic HFpEF/PH:  Echo 5/24 EF 55-60%,  mild LVH, D-shaped septum suggestive of RV pressure/volume overload with peak RV-RA gradient 54 mmHg, moderate RV enlargement with severe decreased systolic function. RHC 5/24 showed severe PAH with severely elevated RA pressure and moderately decreased PAPi at 1.45, CI low at 1.8.  Diuretics held 6/9 in setting of creat bump.  Wt 6/14 - 104.5 (standing scale - we witnessed, doesn't appear to have been recorded), up ~ 5 kg from prior dry wt.  Lasix 80 IV q8h resumed 6/14 w/ dose of metolazone.  Minus 3.1 L yesterday, though wt somehow higher - bed scales have been inaccurate for her.  As above, remains on HFNC.  Doesn't appear markedly volume overloaded but body habitus makes exam challenging.  Renal fxn stable.  Cont IV diuresis w/ goal wt of 99 kg (d/c wt 5/14).  Cont sildenafil and tyvaso.  3.  Pulm HTN:  This has been thought to be predominantly groups 2 and 3 PH from HFpEF, parenchymal lung disease (ILD process) and OSA. On Tyvaso and sildenafil through her pulmonologist at [facility].  She is already on 8L home oxygen, concern for end stage RV failure.  Diuresing as above.  F/u at [facility].  Outpatient eval for Bipap.  4.  Venous thromboembolism:  H/o provoked PE in setting of ECMO in 2019 and spleniv vein thrombosis in 10/2019.  Cont eliquis.  5.  Hypokalemia:  K 3.3 this AM.  Supplementation ordered.  6.  Relative hypotension:  on midodrine - started by AHF team in effort to allow for more sildenafil.  Stable.  Signed, Nicolasa Ducking, NP  11/18/2022, 10:46 AM    For questions or updates, please contact   Please consult [email or web address] for contact info under Cardiology/STEMI.   Cardiology Attending  Patient seen and examined. Agree with above. The patient continues to improve. She has had a reasonable diuresis. Lungs reveal scattered rales and CV with a RRR; ext show no edema and tele with NSR at 90/min. A/P Primary pulmonary HTN - continue diuresis following bp and renal function.   Hypokalemia - replete. Disp - she is slowly getting better but will need to be able to maintain her sats off of high flow oxygen.  Sharlot Gowda Tanay Massiah,MD

## 2022-11-18 NOTE — Progress Notes (Signed)
Pt refused CBG, states she is not diabetic.

## 2022-11-18 NOTE — Progress Notes (Signed)
Progress Note   Patient: Yesenia Carrillo ZOX:[id number] DOB: [birth year] DOA: 11/09/2022     8 DOS: the patient was seen and examined on 11/18/2022   Brief hospital course: ICU transfer for 11/13/2022.  Yesenia Carrillo is a 27 y.o. female who has a PMH of morbid obesity, HTN, HFpEF, RV failure, pulmonary hypertension (group 2 and 3) on tyvaso and sildenafil, provoked PE on anticoagulation, prior splenic vein thrombosis with splenic infarcts, chronic idiopathic leukocytosis/thrombocytosis, sickle cell trait, interstitial lung disease NOS, not on immunosuppression, Chronic respiratory failure with hypoxia on oxygen 8-10L at home baseline, HSV infection,  Iron deficiency, and Polycythemia who presented to the ED with respiratory distress.   On review of her chart, has multiple hospitalizations for similar reason, patient was recently admitted at [facility] in the MICU unit on 10/23/2022 -10/27/2022 with acute on chronic hypoxemic respiratory failure secondary to pulmonary hypertension and volume overload. She was initially placed on high flow nasal cannula but was able to be transitioned to her home nasal cannula oxygen at 8 to 10 L with aggressive IV diuresis. Her torsemide was also increased to 60 mg twice daily with recommendations to follow up with pulmonary hypertension clinic.  ED Course: Initial vital signs showed HR of 135 beats/minute, BP 112/61 mm Hg, the RR 45 breaths/minute, and the oxygen saturation 95% on NRB and a temperature of 99.24F (37.2C). Patient was placed on BiPAP due to respiratory distress unfortunately she started vomiting 5 minutes later and was transitioned to 50L @ 100% HFNC.   Pertinent Labs/Diagnostics Findings: Na+/ K+: 136/3.0 Glucose: 969 BUN/Cr.:15/1.19 WBC:13.1 Hgb/Hct: 15.2/43.8  PCT: negative <0.10 Lactic acid:  COVID PCR: Negative, Troponin: 35 BNP: 716.1 CXR: No acute abnormality   Patient was treated with Duonebs, solumedrol 125 mg IV and mag sulfate 2g. Due to high risk for  intubation, PCCM consulted for admission.  Her presentation was likely triggered by rhinovirus infection but there was also concern of medication noncompliance at home, as well as dietary indiscretion.  Advanced heart failure team was also consulted.  She had a Weight gain of about 15 pound since prior admission.  She was diuresed with IV Lasix along with reintroduction of home metolazone and spironolactone.  Her creatinine did bump so diuretics were held for kidney recovery.  Patient mostly noncompliant with fluid intake, fluid restriction of 1500 cc recommended.  Patient also brought in her home inhaled treprostinil which was started on 6/9.  6/10: Vitals stable on HHFNC at 40L and FiO2 of 80%.  Labs with stable renal function and improving leukocytosis.  Procalcitonin remain negative.  RVP positive for rhinovirus and blood cultures remain negative.  Heart failure team will to continue IV diuresis and planning to increase the dose of sildenafil to 40 mg 3 times daily from tomorrow if blood pressure allows.  Ultimately she will need to be on triple therapy for [facility].  6/11: Patient remained on HHFNC at 40 L with some improvement in FiO2 to 68%.  Frustrated and wants to leave as soon as possible.  6/12: Remained on heated high flow at 30 L, creatinine started trending up, heart failure team is recommending to continue IV diuresis today and switching to p.o. from tomorrow.  6/13: Remained on heated high flow, switched to torsemide today.  Refusing labs.  6/14: Some improvement in oxygen requirement to 35 L at 50% FiO2.  Refusing most of the care-education was provided.  She was very upset and wants to leave AMA as she wants to see  her daughter.  Talked with nursing staff if we can bring her 43-year-old daughter to see her.  She is still 9 pound above her dry weight, cardiology would like to continue diuresis.  6/15: Currently stable at 35 L and 50% FiO2.  Another message sent to respiratory therapist  to try weaning as she is eager to go home.  Renal functions continue to improve, mild hypokalemia which is being repleted.   Assessment and Plan: * Acute on chronic respiratory failure with hypoxemia (HCC) Currently on 35 L of oxygen at 50% FiO2.  Baseline use of 8 to 10 L at home. Likely secondary to rhinovirus.  History of severe pulmonary hypertension and interstitial lung disease. -Continue with supplemental oxygen-try weaning to baseline as tolerated  Pulmonary hypertension due to interstitial lung disease (HCC) Patient follow[facility]  [facility] clinic. -Continue sildenafil-cardiology is planning to increase the dose to 40 mg from tomorrow if blood pressure allows. -Continue home Tyvaso 24 mcg from home now at bedside to be administered every 4 hours while awake   RVF (right ventricular failure) (HCC) Acute on chronic HFpEF.  HFpEF with prominent RV failure and pulmonary hypertension (mixed pulmonary arterial/pulmonary venous hypertension in past):  - Echo 5/24 EF 55-60%, mild LVH, D-shaped septum suggestive of RV pressure/volume overload with peak RV-RA gradient 54 mmHg, moderate RV enlargement with severe decreased systolic function.  - RHC 5/24 showed severe PAH with severely elevated RA pressure and moderately decreased PAPi at 1.45, CI low at 1.8.  Advanced heart failure team is on board. -Switch to torsemide today -Resume digoxin 0.125 mg daily -Fluid restriction to 1500 cc -Low-salt diet -Daily weight and BMP -Strict intake and output  History of pulmonary embolism Had provoked PE in 2019 in setting of ECMO, also had splenic vein thrombosis in 5/21.  She has been anticoagulated.  - Continue apixaban.   Elevated troponin Likely secondary to demand ischemia.  Had extensive workup with cardiology  Hypokalemia Potassium at 3.3, getting IV Lasix -Continue to monitor potassium and replete as needed as she is being diuresed  Class 3 severe obesity with body mass index (BMI) of  40.0 to 44.9 in adult [facility]) Estimated body mass index is 43.03 kg/m as calculated from the following:   Height as of this encounter: 5\' 1"  (1.549 m).   Weight as of this encounter: 103.3 kg.   -This will complicate overall prognosis.   Subjective: Patient was very frustrated that no one is coming to try weaning and she really wants to go home .  Physical Exam: Vitals:   11/18/22 0700 11/18/22 0803 11/18/22 1105 11/18/22 1131  BP:  107/73  106/74  Pulse:  99  89  Resp:  16  18  Temp:  98.5 F (36.9 C)  98.3 F (36.8 C)  TempSrc:    Oral  SpO2:  94% (!) 88% 92%  Weight: 107.9 kg     Height:       General.  Morbidly obese lady, in no acute distress. Pulmonary.  Lungs clear bilaterally, normal respiratory effort. CV.  Regular rate and rhythm, no JVD, rub or murmur. Abdomen.  Soft, nontender, nondistended, BS positive. CNS.  Alert and oriented .  No focal neurologic deficit. Extremities.  No edema, no cyanosis, pulses intact and symmetrical. Psychiatry.  Judgment and insight appears normal.     Data Reviewed: Prior data reviewed  Family Communication: Discussed with patient  Disposition: Status is: Inpatient Remains inpatient appropriate because: Severity of illness  Planned  Discharge Destination: Home  DVT prophylaxis.  Eliquis Time spent: 40 minutes  This record has been created using Conservation officer, historic buildings. Errors have been sought and corrected,but may not always be located. Such creation errors do not reflect on the standard of care.   Author: Arnetha Courser, MD 11/18/2022 3:11 PM  For on call review [email or web address].

## 2022-11-19 DIAGNOSIS — I2723 Pulmonary hypertension due to lung diseases and hypoxia: Secondary | ICD-10-CM | POA: Diagnosis not present

## 2022-11-19 DIAGNOSIS — J849 Interstitial pulmonary disease, unspecified: Secondary | ICD-10-CM | POA: Diagnosis not present

## 2022-11-19 DIAGNOSIS — R0603 Acute respiratory distress: Secondary | ICD-10-CM | POA: Diagnosis not present

## 2022-11-19 DIAGNOSIS — E876 Hypokalemia: Secondary | ICD-10-CM | POA: Diagnosis not present

## 2022-11-19 LAB — CBC
HCT: 47.1 % — ABNORMAL HIGH (ref 36.0–46.0)
Hemoglobin: 16.4 g/dL — ABNORMAL HIGH (ref 12.0–15.0)
MCH: 26.6 pg (ref 26.0–34.0)
MCHC: 34.8 g/dL (ref 30.0–36.0)
MCV: 76.5 fL — ABNORMAL LOW (ref 80.0–100.0)
Platelets: 385 10*3/uL (ref 150–400)
RBC: 6.16 MIL/uL — ABNORMAL HIGH (ref 3.87–5.11)
RDW: 22.5 % — ABNORMAL HIGH (ref 11.5–15.5)
WBC: 15.6 10*3/uL — ABNORMAL HIGH (ref 4.0–10.5)
nRBC: 0.1 % (ref 0.0–0.2)

## 2022-11-19 LAB — BASIC METABOLIC PANEL
Anion gap: 15 (ref 5–15)
BUN: 28 mg/dL — ABNORMAL HIGH (ref 6–20)
CO2: 26 mmol/L (ref 22–32)
Calcium: 9.7 mg/dL (ref 8.9–10.3)
Chloride: 91 mmol/L — ABNORMAL LOW (ref 98–111)
Creatinine, Ser: 1.2 mg/dL — ABNORMAL HIGH (ref 0.44–1.00)
GFR, Estimated: >60 mL/min (ref >60)
Glucose, Bld: 78 mg/dL (ref 70–99)
Potassium: 3.4 mmol/L — ABNORMAL LOW (ref 3.5–5.1)
Sodium: 132 mmol/L — ABNORMAL LOW (ref 135–145)

## 2022-11-19 MED ORDER — SILDENAFIL CITRATE 20 MG PO TABS: 40.0000 mg | ORAL_TABLET | Freq: Three times a day (TID) | ORAL | 0 refills | Status: AC

## 2022-11-19 MED ORDER — POTASSIUM CHLORIDE 20 MEQ PO PACK
20.0000 meq | PACK | Freq: Once | ORAL | Status: AC
  Administered 2022-11-19: 20 meq via ORAL
  Filled 2022-11-19: qty 1

## 2022-11-19 MED ORDER — POTASSIUM CHLORIDE 20 MEQ PO PACK
40.0000 meq | PACK | Freq: Three times a day (TID) | ORAL | Status: DC
  Administered 2022-11-19: 40 meq via ORAL
  Filled 2022-11-19: qty 2

## 2022-11-19 MED ORDER — POTASSIUM CHLORIDE CRYS ER 20 MEQ PO TBCR: 20.0000 meq | EXTENDED_RELEASE_TABLET | Freq: Every day | ORAL | 0 refills | Status: AC

## 2022-11-19 MED ORDER — MIDODRINE HCL 10 MG PO TABS: 10.0000 mg | ORAL_TABLET | Freq: Three times a day (TID) | ORAL | 1 refills | Status: AC

## 2022-11-19 NOTE — Hospital Course (Signed)
Minus 800 overnight, minus 12.2 L since admission.  Wt nearing prior dry wt, currently 100.6 kg (was 99.9 kg previously).

## 2022-11-19 NOTE — Progress Notes (Addendum)
SATURATION QUALIFICATIONS: (This note is used to comply with regulatory documentation for home oxygen)  Patient Saturations on Room Air at Rest = 68%  Patient Saturations on Room Air while Ambulating = %  Patient Saturations on 10 Liters of oxygen while Ambulating = 84%  Please briefly explain why patient needs home oxygen:  Patient's oxygen starts to get  back on 10L of oxygen as soon as she stops walking and sits down.

## 2022-11-19 NOTE — Consult Note (Signed)
PHARMACY CONSULT NOTE  Pharmacy Consult for Electrolyte Monitoring and Replacement   Recent Labs: Potassium (mmol/L)  Date Value  11/19/2022 3.4 (L)  06/06/2013 3.2 (L)   Magnesium (mg/dL)  Date Value  78/46/9629 2.2   Calcium (mg/dL)  Date Value  52/84/1324 9.7   Calcium, Total (mg/dL)  Date Value  40/03/2724 9.4   Albumin (g/dL)  Date Value  36/64/4034 3.9  06/06/2013 3.9   Phosphorus (mg/dL)  Date Value  74/25/9563 4.9 (H)   Sodium (mmol/L)  Date Value  11/19/2022 132 (L)  06/06/2013 139     Assessment: 27 yo female with PMH including HTN, HFpEF, pulmonary hypertension, Hx of PE, and chronic respiratory failure presented to the ED with respiratory distress.  Admitted for treatment of acute on chronic respiratory failure and acute on chronic CHF.  Diuretics: furosemide 80 mg IV every 8 hours Medication: Digoxin  Goal of Therapy:  Electrolytes WNL  Plan:  K 3.4   will order KCL po x 1 Will increase scheduled from KCL po BID  to TID while on lasix 80mg  IV q8h Follow-up electrolytes with AM labs  Angelique Blonder ,PharmD Clinical Pharmacist 11/19/2022 9:38 AM

## 2022-11-19 NOTE — Discharge Summary (Signed)
Physician Discharge Summary   Patient: Yesenia Carrillo MRN: [id number] DOB: 08-Sep-1995  Admit date:     11/09/2022  Discharge date: 11/19/22  Discharge Physician: Arnetha Courser   PCP: [facility]   Recommendations at discharge:  Please obtain CBC and BMP within a week Patient need to continue at least 10 L of oxygen, might increase with ambulation to keep the saturation above 80% Follow-up closely with [facility] providers for severe pulmonary hypertension Follow-up with primary care provider Follow-up with cardiology  Discharge Diagnoses: Principal Problem:   Acute on chronic respiratory failure with hypoxemia [facility]) Active Problems:   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  Yesenia Carrillo is a 27 y.o. female who has a PMH of morbid obesity, HTN, HFpEF, RV failure, pulmonary hypertension (group 2 and 3) on tyvaso and sildenafil, provoked PE on anticoagulation, prior splenic vein thrombosis with splenic infarcts, chronic idiopathic leukocytosis/thrombocytosis, sickle cell trait, interstitial lung disease NOS, not on immunosuppression, Chronic respiratory failure with hypoxia on oxygen 8-10L at home baseline, HSV infection,  Iron deficiency, and Polycythemia who presented to the ED with respiratory distress.   On review of her chart, has multiple hospitalizations for similar reason, patient was recently admitted at [facility] in the MICU unit on 10/23/2022 -10/27/2022 with acute on chronic hypoxemic respiratory failure secondary to pulmonary hypertension and volume overload. She was initially placed on high flow nasal cannula but was able to be transitioned to her home nasal cannula oxygen at  8 to 10 L with aggressive IV diuresis. Her torsemide was also increased to 60 mg twice daily with recommendations to follow up with pulmonary hypertension clinic.  ED Course: Initial vital signs showed HR of 135 beats/minute, BP 112/61 mm Hg, the RR 45 breaths/minute, and the oxygen saturation 95% on NRB and a temperature of 99.26F (37.2C). Patient was placed on BiPAP due to respiratory distress unfortunately she started vomiting 5 minutes later and was transitioned to 50L @ 100% HFNC.   Pertinent Labs/Diagnostics Findings: Na+/ K+: 136/3.0 Glucose: 969 BUN/Cr.:15/1.19 WBC:13.1 Hgb/Hct: 15.2/43.8  PCT: negative <0.10 Lactic acid:  COVID PCR: Negative, Troponin: 35 BNP: 716.1 CXR: No acute abnormality   Patient was treated with Duonebs, solumedrol 125 mg IV and mag sulfate 2g. Due to high risk for intubation, PCCM consulted for admission.  Her presentation was likely triggered by rhinovirus infection but there was also concern of medication noncompliance at home, as well as dietary indiscretion.  Advanced heart failure team was also consulted.  She had a Weight gain of about 15 pound since prior admission.  She was diuresed with IV Lasix along with reintroduction of home metolazone and spironolactone.  Her creatinine did bump so diuretics were held for kidney recovery.  Patient mostly noncompliant with fluid intake, fluid restriction of 1500 cc recommended.  Patient also brought in her home inhaled treprostinil which was started on 6/9.  6/10: Vitals stable on HHFNC at 40L and FiO2 of 80%.  Labs with stable renal function and improving leukocytosis.  Procalcitonin remain negative.  RVP positive for rhinovirus and blood cultures remain negative.  Heart failure team will to continue IV diuresis and planning to increase the  dose of sildenafil to 40 mg 3 times daily from tomorrow if blood pressure allows.  Ultimately she will need to be on triple therapy for [facility].  6/11: Patient remained on HHFNC at  40 L with some improvement in FiO2 to 68%.  Frustrated and wants to leave as soon as possible.  6/12: Remained on heated high flow at 30 L, creatinine started trending up, heart failure team is recommending to continue IV diuresis today and switching to p.o. from tomorrow.  6/13: Remained on heated high flow, switched to torsemide today.  Refusing labs.  6/14: Some improvement in oxygen requirement to 35 L at 50% FiO2.  Refusing most of the care-education was provided.  She was very upset and wants to leave AMA as she wants to see her daughter.  Talked with nursing staff if we can bring her 86-year-old daughter to see her.  She is still 9 pound above her dry weight, cardiology would like to continue diuresis.  6/15: Currently stable at 35 L and 50% FiO2.  Another message sent to respiratory therapist to try weaning as she is eager to go home.  Renal functions continue to improve, mild hypokalemia which is being repleted.  6/16: Patient was able to wean back to 10 L at rest with some desaturation on ambulation but she quickly recovers.  Slight increase in creatinine and clinically appears euvolemic now.  IV Lasix was stopped and she will now go back to her home dose of torsemide.  She is being discharged on increased doses of sildenafil and midodrine.  She will continue with rest of her home medications except spironolactone which was discontinued by cardiology and need to have a very close follow-up with her providers at [facility] for further management of her severe pulmonary hypertension causing right heart failure.  Patient also need to continue counseling for weight loss as she most likely will need lung transplant down the road.  Patient need continuous counseling to stay compliant as it will decrease her life expectancy tremendously if she is not compliant with her life limiting and severe comorbidities.  Assessment and Plan: * Acute on chronic respiratory failure with hypoxemia (HCC) Currently on  35 L of oxygen at 50% FiO2.  Baseline use of 8 to 10 L at home. Likely secondary to rhinovirus.  History of severe pulmonary hypertension and interstitial lung disease. 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 -Continue sildenafil-cardiology is planning to increase the dose to 40 mg from tomorrow if blood pressure allows. -Continue home Tyvaso 24 mcg from home now at bedside to be administered every 4 hours while awake   RVF (right ventricular failure) (HCC) Acute on chronic HFpEF.  HFpEF with prominent RV failure and pulmonary hypertension (mixed pulmonary arterial/pulmonary venous hypertension in past):  - Echo 5/24 EF 55-60%, mild LVH, D-shaped septum suggestive of RV pressure/volume overload with peak RV-RA gradient 54 mmHg, moderate RV enlargement with severe decreased systolic function.  - RHC 5/24 showed severe PAH with severely elevated RA pressure and moderately decreased PAPi at 1.45, CI low at 1.8.  Advanced heart failure team is on board. -Switch to torsemide today -Resume digoxin 0.125 mg daily -Fluid restriction to 1500 cc -Low-salt diet -Daily weight and BMP -Strict intake and output  History of pulmonary embolism Had provoked PE in 2019 in setting of ECMO, also had splenic vein thrombosis in 5/21.  She has been  anticoagulated.  - Continue apixaban.   Elevated troponin Likely secondary to demand ischemia.  Had extensive workup with cardiology  Hypokalemia Potassium at 3.3, getting IV Lasix -Continue to monitor potassium and replete as needed as she is being diuresed  Class 3 severe obesity with body mass index (BMI) of 40.0 to 44.9 in adult [facility] Estimated body mass index is 43.03 kg/m as calculated from the following:   Height as of this encounter: 5\' 1"  (1.549 m).   Weight as of this encounter: 103.3 kg.   -This will complicate  overall prognosis.   Consultants: PCCM.  Cardiology Procedures performed: None Disposition: Home Diet recommendation:  Discharge Diet Orders (From admission, onward)     Start     Ordered   11/19/22 0000  Diet - low sodium heart healthy        11/19/22 1155           Cardiac diet DISCHARGE MEDICATION: Allergies as of 11/19/2022       Reactions   Ampicillin Rash, Other (See Comments)   Patient and family cannot remember the specifics of the reaction.   Chlorhexidine Gluconate Itching   Penicillins Hives   Previously tolerated cefazolin and cefepime 05/03/18        Medication List     STOP taking these medications    hydrocortisone cream 1 %   lidocaine 2 % jelly Commonly known as: XYLOCAINE   spironolactone 25 MG tablet Commonly known as: ALDACTONE   Trelegy Ellipta 200-62.5-25 MCG/ACT Aepb Generic drug: Fluticasone-Umeclidin-Vilant       TAKE these medications    albuterol 108 (90 Base) MCG/ACT inhaler Commonly known as: VENTOLIN HFA Inhale 1-2 puffs into the lungs every 6 (six) hours as needed for wheezing or shortness of breath.   calcium carbonate 500 MG chewable tablet Commonly known as: TUMS - dosed in mg elemental calcium Chew 1 tablet (200 mg of elemental calcium total) by mouth 3 (three) times daily as needed for indigestion or heartburn.   digoxin 0.125 MG tablet Commonly known as: LANOXIN Take 1 tablet (0.125 mg total) by mouth daily.   Eliquis 5 MG Tabs tablet Generic drug: apixaban Take 5 mg by mouth 2 (two) times daily.   famotidine 20 MG tablet Commonly known as: PEPCID Take 20 mg by mouth 2 (two) times daily.   ipratropium-albuterol 0.5-2.5 (3) MG/3ML Soln Commonly known as: DUONEB Take 3 mLs by nebulization every 6 (six) hours as needed.   midodrine 10 MG tablet Commonly known as: PROAMATINE Take 1 tablet (10 mg total) by mouth 3 (three) times daily with meals. What changed:  medication strength how much to take    potassium chloride SA 20 MEQ tablet Commonly known as: KLOR-CON M Take 1 tablet (20 mEq total) by mouth daily.   sildenafil 20 MG tablet Commonly known as: REVATIO Take 2 tablets (40 mg total) by mouth every 8 (eight) hours. What changed:  how much to take when to take this   torsemide 20 MG tablet Commonly known as: DEMADEX Take 2 tablets (40 mg total) by mouth 2 (two) times daily.   Tyvaso Refill 0.6 MG/ML Soln Generic drug: Treprostinil Inhale 18 mcg into the lungs every 4 (four) hours while awake.        Follow-up Information     [facility], [facility]. Schedule an appointment as soon as possible for a visit in 1 week(s).   Specialty: General Practice Contact information: [address] [place]  [address] [phone]                Discharge Exam: Filed Weights   11/16/22 0500 11/18/22 0700 11/19/22 0440  Weight: 107.4 kg 107.9 kg 100.6 kg   General.  Morbidly obese lady, in no acute distress. Pulmonary.  Lungs clear bilaterally, normal respiratory effort. CV.  Regular rate and rhythm, no JVD, rub or murmur. Abdomen.  Soft, nontender, nondistended, BS positive. CNS.  Alert and oriented .  No focal neurologic deficit. Extremities.  No edema, no cyanosis, pulses intact and symmetrical. Psychiatry.  Judgment and insight appears normal.   Condition at discharge: stable  The results of significant diagnostics from this hospitalization (including imaging, microbiology, ancillary and laboratory) are listed below for reference.   Imaging Studies: DG CHEST PORT 1 VIEW  Result Date: 11/15/2022 CLINICAL DATA:  Dyspnea EXAM: PORTABLE CHEST 1 VIEW COMPARISON:  Previous studies including the examination of 11/12/2022 FINDINGS: Transverse diameter of heart is increased. There is increased density in left lower lung field partly obscuring the left hemidiaphragm. There are no signs of alveolar pulmonary edema. Left lateral CP angle is  indistinct. Right lateral CP angle is clear. There is no pneumothorax. IMPRESSION: Cardiomegaly. There is increased density in left lower lung fields suggesting possible atelectasis/pneumonia. Left lateral CP angle is indistinct suggesting possible small effusion. Electronically Signed   By: Ernie Avena M.D.   On: 11/15/2022 13:18   DG Chest Port 1 View  Result Date: 11/12/2022 CLINICAL DATA:  Acute hypoxic respiratory failure EXAM: PORTABLE CHEST - 1 VIEW COMPARISON:  None available. FINDINGS: Heart is moderately enlarged. No pulmonary vascular congestion. Lungs clear. IMPRESSION: Unchanged moderate cardiomegaly. Electronically Signed   By: Acquanetta Belling M.D.   On: 11/12/2022 08:39   DG Chest Port 1 View  Result Date: 11/10/2022 CLINICAL DATA:  Shortness of breath EXAM: PORTABLE CHEST 1 VIEW COMPARISON:  10/10/2022 FINDINGS: Cardiac shadow is enlarged but stable. Lungs are well aerated bilaterally. No focal infiltrate or effusion is seen. No bony abnormality is noted. IMPRESSION: Stable cardiomegaly.  No acute abnormality seen. Electronically Signed   By: Alcide Clever M.D.   On: 11/10/2022 00:17    Microbiology: Results for orders placed or performed during the hospital encounter of 11/09/22  SARS Coronavirus 2 by RT PCR (hospital order, performed in [facility] hospital lab) *cepheid single result test* Anterior Nasal Swab     Status: None   Collection Time: 11/10/22 12:03 AM   Specimen: Anterior Nasal Swab  Result Value Ref Range Status   SARS Coronavirus 2 by RT PCR NEGATIVE NEGATIVE Final    Comment: (NOTE) SARS-CoV-2 target nucleic acids are NOT DETECTED.  The SARS-CoV-2 RNA is generally detectable in upper and lower respiratory specimens during the acute phase of infection. The lowest concentration of SARS-CoV-2 viral copies this assay can detect is 250 copies / mL. A negative result does not preclude SARS-CoV-2 infection and should not be used as the sole basis for treatment or  other patient management decisions.  A negative result may occur with improper specimen collection / handling, submission of specimen other than nasopharyngeal swab, presence of viral mutation(s) within the areas targeted by this assay, and inadequate number of viral copies (<250 copies / mL). A negative result must be combined with clinical observations, patient history, and epidemiological information.  Fact Sheet for Patients:   RoadLapTop.co.za  Fact Sheet for Healthcare Providers: http://kim-miller.com/  This test is not yet approved or  cleared by the [place]  FDA and has been authorized for detection and/or diagnosis of SARS-CoV-2 by FDA under an Emergency Use Authorization (EUA).  This EUA will remain in effect (meaning this test can be used) for the duration of the COVID-19 declaration under Section 564(b)(1) of the Act, 21 U.S.C. section 360bbb-3(b)(1), unless the authorization is terminated or revoked sooner.  Performed at [facility], [address] [place], [place] [address]   Culture, blood (Routine X 2) w Reflex to ID Panel     Status: None   Collection Time: 11/10/22  5:07 AM   Specimen: BLOOD  Result Value Ref Range Status   Specimen Description BLOOD RIGHT ASSIST CONTROL  Final   Special Requests   Final    BOTTLES DRAWN AEROBIC AND ANAEROBIC Blood Culture adequate volume   Culture   Final    NO GROWTH 5 DAYS Performed at [facility], [address] [place], [place] [address]    Report Status 11/15/2022 FINAL  Final  Culture, blood (Routine X 2) w Reflex to ID Panel     Status: None   Collection Time: 11/10/22  5:19 AM   Specimen: BLOOD  Result Value Ref Range Status   Specimen Description BLOOD LEFT HAND  Final   Special Requests   Final    BOTTLES DRAWN AEROBIC AND ANAEROBIC Blood Culture results may not be optimal due to an inadequate volume of blood received in culture bottles    Culture   Final    NO GROWTH 5 DAYS Performed at [facility], [address], [place], [place] [address]    Report Status 11/15/2022 FINAL  Final  MRSA Next Gen by PCR, Nasal     Status: None   Collection Time: 11/10/22 12:51 PM   Specimen: Nasal Mucosa; Nasal Swab  Result Value Ref Range Status   MRSA by PCR Next Gen NOT DETECTED NOT DETECTED Final    Comment: (NOTE) The GeneXpert MRSA Assay (FDA approved for NASAL specimens only), is one component of a comprehensive MRSA colonization surveillance program. It is not intended to diagnose MRSA infection nor to guide or monitor treatment for MRSA infections. Test performance is not FDA approved in patients less than 26 years old. Performed at [facility], [address] [place], [place] [address]   Respiratory (~20 pathogens) panel by PCR     Status: Abnormal   Collection Time: 11/10/22  2:23 PM   Specimen: Nasopharyngeal Swab; Respiratory  Result Value Ref Range Status   Adenovirus NOT DETECTED NOT DETECTED Final   Coronavirus 229E NOT DETECTED NOT DETECTED Final    Comment: (NOTE) The Coronavirus on the Respiratory Panel, DOES NOT test for the novel  Coronavirus (2019 nCoV)    Coronavirus HKU1 NOT DETECTED NOT DETECTED Final   Coronavirus NL63 NOT DETECTED NOT DETECTED Final   Coronavirus OC43 NOT DETECTED NOT DETECTED Final   Metapneumovirus NOT DETECTED NOT DETECTED Final   Rhinovirus / Enterovirus DETECTED (A) NOT DETECTED Final   Influenza A NOT DETECTED NOT DETECTED Final   Influenza B NOT DETECTED NOT DETECTED Final   Parainfluenza Virus 1 NOT DETECTED NOT DETECTED Final   Parainfluenza Virus 2 NOT DETECTED NOT DETECTED Final   Parainfluenza Virus 3 NOT DETECTED NOT DETECTED Final   Parainfluenza Virus 4 NOT DETECTED NOT DETECTED Final   Respiratory Syncytial Virus NOT DETECTED NOT DETECTED Final   Bordetella pertussis NOT DETECTED NOT DETECTED Final   Bordetella Parapertussis NOT DETECTED NOT  DETECTED Final  Chlamydophila pneumoniae NOT DETECTED NOT DETECTED Final   Mycoplasma pneumoniae NOT DETECTED NOT DETECTED Final    Comment: Performed at [facility], [address] [place], [place] [address]    Labs: CBC: Recent Labs  Lab 11/13/22 0631 11/19/22 0903  WBC 15.7* 15.6*  NEUTROABS 8.5*  --   HGB 15.0 16.4*  HCT 43.0 47.1*  MCV 77.5* 76.5*  PLT 344 385   Basic Metabolic Panel: Recent Labs  Lab 11/13/22 0631 11/14/22 0614 11/15/22 0419 11/17/22 0617 11/18/22 0515 11/19/22 0903  NA 135 135 133* 134* 133* 132*  K 4.2 4.0 4.2 3.7 3.3* 3.4*  CL 99 98 95* 96* 93* 91*  CO2 26 26 26 26 27 26   GLUCOSE 89 73 130* 113* 83 78  BUN 34* 26* 27* 24* 23* 28*  CREATININE 1.09* 0.85 1.04* 1.05* 0.97 1.20*  CALCIUM 9.2 9.2 9.0 9.5 9.8 9.7  MG 2.4 2.5* 2.3  --  2.2  --   PHOS 4.9*  --   --   --   --   --    Liver Function Tests: Recent Labs  Lab 11/13/22 0631  ALBUMIN 3.9   CBG: Recent Labs  Lab 11/14/22 0810 11/14/22 1934 11/15/22 0737 11/15/22 2107 11/17/22 0745  GLUCAP 76 103* 98 81 72    Discharge time spent: greater than 30 minutes.  This record has been created using Conservation officer, historic buildings. Errors have been sought and corrected,but may not always be located. Such creation errors do not reflect on the standard of care.   Signed: Arnetha Courser, MD [facility] 11/19/2022

## 2022-11-19 NOTE — Progress Notes (Signed)
Progress Note  Patient Name: Yesenia Carrillo Date of Encounter: 11/19/2022  Primary Cardiologist: None   Subjective   Dyspnea improved. Large diuresis yesterday  Inpatient Medications    Scheduled Meds:  apixaban  5 mg Oral BID   digoxin  0.125 mg Oral Daily   midodrine  10 mg Oral TID WC   potassium chloride  40 mEq Oral TID   sildenafil  40 mg Oral Q8H   Treprostinil  18 mcg Inhalation Q4H while awake   Continuous Infusions:  PRN Meds: acetaminophen, docusate sodium, ondansetron (ZOFRAN) IV, mouth rinse, polyethylene glycol   Vital Signs    Vitals:   11/19/22 0440 11/19/22 0700 11/19/22 0853 11/19/22 1133  BP: 119/80  91/61 (!) 83/49  Pulse: 97  97 94  Resp: 20  20 16   Temp: 98.1 F (36.7 C)  98.7 F (37.1 C) 98.4 F (36.9 C)  TempSrc:   Oral Oral  SpO2: 96% 97% 100% 97%  Weight: 100.6 kg     Height:        Intake/Output Summary (Last 24 hours) at 11/19/2022 1141 Last data filed at 11/19/2022 1045 Gross per 24 hour  Intake 1410 ml  Output 1400 ml  Net 10 ml   Filed Weights   11/16/22 0500 11/18/22 0700 11/19/22 0440  Weight: 107.4 kg 107.9 kg 100.6 kg    Telemetry    NSR - Personally Reviewed  ECG    none - Personally Reviewed  Physical Exam   GEN: No acute distress.   Neck: No JVD Cardiac: RRR, no murmurs, rubs, or gallops. Loud P2.  Respiratory: Clear to auscultation bilaterally. GI: Soft, nontender, non-distended  MS: No edema; No deformity. Neuro:  Nonfocal  Psych: Normal affect   Labs    Chemistry Recent Labs  Lab 11/13/22 0631 11/14/22 7829 11/17/22 0617 11/18/22 0515 11/19/22 0903  NA 135   < > 134* 133* 132*  K 4.2   < > 3.7 3.3* 3.4*  CL 99   < > 96* 93* 91*  CO2 26   < > 26 27 26   GLUCOSE 89   < > 113* 83 78  BUN 34*   < > 24* 23* 28*  CREATININE 1.09*   < > 1.05* 0.97 1.20*  CALCIUM 9.2   < > 9.5 9.8 9.7  ALBUMIN 3.9  --   --   --   --   GFRNONAA >60   < > >60 >60 >60  ANIONGAP 10   < > 12 13 15    < > = values  in this interval not displayed.     Hematology Recent Labs  Lab 11/13/22 0631 11/19/22 0903  WBC 15.7* 15.6*  RBC 5.55* 6.16*  HGB 15.0 16.4*  HCT 43.0 47.1*  MCV 77.5* 76.5*  MCH 27.0 26.6  MCHC 34.9 34.8  RDW 22.2* 22.5*  PLT 344 385    Cardiac EnzymesNo results for input(s): "TROPONINI" in the last 168 hours. No results for input(s): "TROPIPOC" in the last 168 hours.   BNPNo results for input(s): "BNP", "PROBNP" in the last 168 hours.   DDimer No results for input(s): "DDIMER" in the last 168 hours.   Radiology    No results found.  Cardiac Studies   none  Patient Profile     27 y.o. female admitted with acute diastolic heart failure in the setting of pulmonary HTN PA pressure 76 on recent heart cath, PVR 12.  Assessment & Plan    Severe  pulmonary HTN - she will continue diuretic therapy. Volume overload - her weight is down 7 kg since yesterday and renal function bumped slightly. She is euvolemic and can be discharged to followup at [facility].  [facility] will sign off.   Medication Recommendations:  continue  torsemide 20 mg bid Other recommendations (labs, testing, etc):  followup Dr. Shirlee Latch Follow up as an outpatient:  3-4 weeks.  For questions or updates, please contact [facility] Please consult [email or web address] for contact info under Cardiology/STEMI.      Signed, Lewayne Bunting, MD  11/19/2022, 11:41 AM

## 2023-02-03 ENCOUNTER — Emergency Department
Admission: EM | Admit: 2023-02-03 | Discharge: 2023-03-06 | Disposition: E | Payer: Medicaid Other | Attending: Emergency Medicine | Admitting: Emergency Medicine

## 2023-02-03 DIAGNOSIS — J9601 Acute respiratory failure with hypoxia: Secondary | ICD-10-CM

## 2023-02-03 DIAGNOSIS — I469 Cardiac arrest, cause unspecified: Secondary | ICD-10-CM

## 2023-02-03 DIAGNOSIS — I509 Heart failure, unspecified: Secondary | ICD-10-CM | POA: Insufficient documentation

## 2023-02-03 DIAGNOSIS — I110 Hypertensive heart disease with heart failure: Secondary | ICD-10-CM | POA: Insufficient documentation

## 2023-02-03 HISTORY — DX: Other pulmonary embolism without acute cor pulmonale: I26.99

## 2023-02-03 HISTORY — DX: Pulmonary hypertension, unspecified: I27.20

## 2023-02-03 MED ORDER — EPINEPHRINE 1 MG/10ML IJ SOSY
PREFILLED_SYRINGE | INTRAMUSCULAR | Status: AC | PRN
Start: 2023-02-03 — End: 2023-02-04
  Administered 2023-02-03 – 2023-02-04 (×11): 1 mg via INTRAVENOUS

## 2023-02-03 MED ORDER — CALCIUM CHLORIDE 10 % IV SOLN
INTRAVENOUS | Status: AC | PRN
Start: 2023-02-03 — End: 2023-02-03
  Administered 2023-02-03: 1 g via INTRAVENOUS

## 2023-02-03 MED ORDER — NALOXONE HCL 2 MG/2ML IJ SOSY
PREFILLED_SYRINGE | INTRAMUSCULAR | Status: AC | PRN
Start: 2023-02-03 — End: ?
  Administered 2023-02-03: 2 mg via INTRAVENOUS

## 2023-02-03 MED ORDER — SODIUM BICARBONATE 8.4 % IV SOLN
INTRAVENOUS | Status: AC | PRN
Start: 2023-02-03 — End: 2023-02-04
  Administered 2023-02-03 – 2023-02-04 (×4): 50 meq via INTRAVENOUS

## 2023-02-04 ENCOUNTER — Encounter: Payer: Self-pay | Admitting: Emergency Medicine

## 2023-02-04 DEATH — deceased

## 2023-02-07 LAB — CBG MONITORING, ED: Glucose-Capillary: 99 mg/dL (ref 70–99)

## 2023-03-06 NOTE — ED Notes (Signed)
MD, Candelaria Stagers, and Charge RN with family at bedside.

## 2023-03-06 NOTE — ED Provider Notes (Signed)
Yesenia Carrillo   Total critical care time: 40 minutes  Critical care time was exclusive of separately billable procedures and treating other patients.  Critical care was necessary to treat or prevent imminent or life-threatening deterioration.  Critical care was time spent personally by me on the following activities: development of treatment plan with patient and/or surrogate as well as nursing, discussions with consultants, evaluation of patient's response to treatment, examination of patient, obtaining history from patient or surrogate, ordering and performing treatments and interventions, ordering and review of laboratory studies, ordering and review of radiographic studies, pulse oximetry and re-evaluation of patient's condition.  Cardiopulmonary Resuscitation (CPR) Procedure Note Directed/Performed by: Rochele Raring I personally directed ancillary staff and/or performed CPR in an effort to regain return of spontaneous circulation and to  maintain cardiac, neuro and systemic perfusion.    Procedure Name: Intubation Date/Time: 03/02/2023 12:00 AM  Performed by: Kiani Wurtzel, Layla Maw, DOPre-anesthesia Checklist: Patient identified, Patient being monitored, Emergency Drugs available, Suction available and Timeout performed Oxygen Delivery Method: Ambu bag Preoxygenation: Pre-oxygenation with 100% oxygen Ventilation: Mask ventilation with difficulty Laryngoscope Size: Glidescope and 3 Grade View: Grade II Tube size: 7.5 mm Number of attempts: 1 Placement Confirmation: ETT inserted through vocal cords under direct vision, Positive ETCO2, CO2 detector and Breath sounds checked- equal and bilateral Secured at: 23 cm Tube secured with: ETT holder Difficulty Due To: Difficulty was anticipated and Difficult Airway- due to reduced neck mobility        IMPRESSION / MDM / ASSESSMENT AND PLAN / ED COURSE  I reviewed the triage vital signs and the nursing notes.    Patient here with respiratory failure leading to cardiac arrest.  The patient is on the cardiac monitor to evaluate for evidence of arrhythmia and/or significant heart rate changes.   DIFFERENTIAL DIAGNOSIS (includes but not limited to):   Respiratory failure secondary to CHF, pulmonary hypertension, morbid obesity, interstitial lung disease, recurrent PE.  Suspect cardiac arrest secondary to respiratory failure.   Patient's presentation is most consistent with acute presentation with potential threat to life or bodily function.   PLAN: Patient brought into the emergency department with EMS in respiratory distress.  They report she was initially on CPAP but then became unresponsive and was getting bagged.  On arrival to the room, patient was noted to be pulseless and in PEA.  CPR initiated immediately and once patient transferred to our stretcher, patient intubated using glide scope.  Pupils already fixed and dilated when she arrived to the ED.   MEDICATIONS GIVEN IN  ED: Medications  EPINEPHrine (ADRENALIN) 1 MG/10ML injection (1 mg Intravenous Given 02/23/2023 0021)  sodium bicarbonate injection (50 mEq Intravenous Given 02/28/2023 0017)  naloxone [facility]) injection (2 mg Intravenous Given 01/13/2023 2355)  calcium chloride injection (1 g Intravenous Given 01/21/2023 2357)     ED COURSE: Despite getting patient's oxygen saturation to 100% with intubation and CPR, we are unable to successfully resuscitate patient.  She received multiple rounds of CPR with a total of 11 epinephrines, 4 amps of bicarb, 2 mg of IV Narcan, 1 g of IV calcium.  Blood glucose was 99.  Left tibial IO was placed as well as peripheral IV.  Bedside ultrasound showed no pericardial effusion and no cardiac motion.  Patient was initially in PEA but then decompensated to likely asystole versus a very fine V-fib.  She did receive 3 rounds of defibrillation at 200 J without any change in rhythm and then ultimately was in asystole.  I did  Yesenia Carrillo   Total critical care time: 40 minutes  Critical care time was exclusive of separately billable procedures and treating other patients.  Critical care was necessary to treat or prevent imminent or life-threatening deterioration.  Critical care was time spent personally by me on the following activities: development of treatment plan with patient and/or surrogate as well as nursing, discussions with consultants, evaluation of patient's response to treatment, examination of patient, obtaining history from patient or surrogate, ordering and performing treatments and interventions, ordering and review of laboratory studies, ordering and review of radiographic studies, pulse oximetry and re-evaluation of patient's condition.  Cardiopulmonary Resuscitation (CPR) Procedure Note Directed/Performed by: Rochele Raring I personally directed ancillary staff and/or performed CPR in an effort to regain return of spontaneous circulation and to  maintain cardiac, neuro and systemic perfusion.    Procedure Name: Intubation Date/Time: 03/02/2023 12:00 AM  Performed by: Kiani Wurtzel, Layla Maw, DOPre-anesthesia Checklist: Patient identified, Patient being monitored, Emergency Drugs available, Suction available and Timeout performed Oxygen Delivery Method: Ambu bag Preoxygenation: Pre-oxygenation with 100% oxygen Ventilation: Mask ventilation with difficulty Laryngoscope Size: Glidescope and 3 Grade View: Grade II Tube size: 7.5 mm Number of attempts: 1 Placement Confirmation: ETT inserted through vocal cords under direct vision, Positive ETCO2, CO2 detector and Breath sounds checked- equal and bilateral Secured at: 23 cm Tube secured with: ETT holder Difficulty Due To: Difficulty was anticipated and Difficult Airway- due to reduced neck mobility        IMPRESSION / MDM / ASSESSMENT AND PLAN / ED COURSE  I reviewed the triage vital signs and the nursing notes.    Patient here with respiratory failure leading to cardiac arrest.  The patient is on the cardiac monitor to evaluate for evidence of arrhythmia and/or significant heart rate changes.   DIFFERENTIAL DIAGNOSIS (includes but not limited to):   Respiratory failure secondary to CHF, pulmonary hypertension, morbid obesity, interstitial lung disease, recurrent PE.  Suspect cardiac arrest secondary to respiratory failure.   Patient's presentation is most consistent with acute presentation with potential threat to life or bodily function.   PLAN: Patient brought into the emergency department with EMS in respiratory distress.  They report she was initially on CPAP but then became unresponsive and was getting bagged.  On arrival to the room, patient was noted to be pulseless and in PEA.  CPR initiated immediately and once patient transferred to our stretcher, patient intubated using glide scope.  Pupils already fixed and dilated when she arrived to the ED.   MEDICATIONS GIVEN IN  ED: Medications  EPINEPHrine (ADRENALIN) 1 MG/10ML injection (1 mg Intravenous Given 02/23/2023 0021)  sodium bicarbonate injection (50 mEq Intravenous Given 02/28/2023 0017)  naloxone [facility]) injection (2 mg Intravenous Given 01/13/2023 2355)  calcium chloride injection (1 g Intravenous Given 01/21/2023 2357)     ED COURSE: Despite getting patient's oxygen saturation to 100% with intubation and CPR, we are unable to successfully resuscitate patient.  She received multiple rounds of CPR with a total of 11 epinephrines, 4 amps of bicarb, 2 mg of IV Narcan, 1 g of IV calcium.  Blood glucose was 99.  Left tibial IO was placed as well as peripheral IV.  Bedside ultrasound showed no pericardial effusion and no cardiac motion.  Patient was initially in PEA but then decompensated to likely asystole versus a very fine V-fib.  She did receive 3 rounds of defibrillation at 200 J without any change in rhythm and then ultimately was in asystole.  I did  discuss the possibility of getting patient TNK given history of previous PE however given pupils fixed and dilated with GCS of 3 without any sedation, paralytic on board, we felt the likelihood of meaningful recovery was extremely unlikely.  Given no signs of life, GCS of 3, fixed and dilated pupils, no cardiac activity seen on bedside ultrasound despite multiple resources to try to resuscitate her, decision was made to discontinue further resuscitation.  Time of death called at 12:23 AM.  Patient's mother, father and significant other informed of patient's death with chaplain and charge nurse present.  I also discussed the case with Sherrie George, medical examiner on-call.  She states given patient's medical history, she is not a medical examiner case.  CONSULTS: No consults as patient expired in the ED.   OUTSIDE RECORDS REVIEWED: Reviewed last admission in June 2024.       FINAL CLINICAL IMPRESSION(S) / ED DIAGNOSES   Final diagnoses:  Acute  respiratory failure with hypoxia (HCC)  Cardiac arrest (HCC)     Rx / DC Orders   ED Discharge Orders     None        Note:  This document was prepared using Dragon voice recognition software and may include unintentional dictation errors.   Deem Marmol, Layla Maw, DO 03/01/2023 [phone]

## 2023-03-06 NOTE — Progress Notes (Signed)
Paged by staff for active code. Pt did pass after staff did everything they could. Stayed present with staff and was at bedside for notification to family from Dr. Elesa Massed and Charge RN. Family upset and grieving. Mom, dad, significant other and brother currently at bedside. Care cart was brought in and family provided with water. Offered my support and condolences. Will return to get funeral home information and let them know I am around for any needs.

## 2023-03-06 NOTE — Code Documentation (Addendum)
Per Ward, MD don't wait three minutes until next round of epi.

## 2023-03-06 NOTE — ED Notes (Addendum)
When pt was rolled in room she was being bagged by ems. BP with ems 90/50 and pulse ox never above 80% with cp and bvm. No pulse rate given or temp. EMS states per fire CBG was in the 160s. No medications given en route. No access.   Once pt was on stretcher there was found to be no pulse. EMS stated she had a pulse in the truck. ACLS started immediately.

## 2023-03-06 NOTE — Code Documentation (Signed)
Per Ward, MD don't wait three minutes until next round of epi.

## 2023-03-06 NOTE — Code Documentation (Signed)
No access to provide acls medications.

## 2023-03-06 NOTE — ED Notes (Signed)
Per MD Ward this is not an ME case.

## 2023-03-06 NOTE — ED Notes (Addendum)
Medications not given by Ward but given by Tedra Senegal.

## 2023-03-06 DEATH — deceased
